# Patient Record
Sex: Female | Born: 1935 | Race: White | Hispanic: No | Marital: Married | Smoking: Never smoker
Health system: Southern US, Community
[De-identification: ages and names within clinical notes are randomized; demographics above are authoritative.]

## PROBLEM LIST (undated history)

## (undated) DIAGNOSIS — Z860100 Personal history of colon polyps, unspecified: Secondary | ICD-10-CM

## (undated) DIAGNOSIS — I2510 Atherosclerotic heart disease of native coronary artery without angina pectoris: Secondary | ICD-10-CM

## (undated) DIAGNOSIS — E119 Type 2 diabetes mellitus without complications: Secondary | ICD-10-CM

## (undated) DIAGNOSIS — K219 Gastro-esophageal reflux disease without esophagitis: Secondary | ICD-10-CM

## (undated) DIAGNOSIS — I10 Essential (primary) hypertension: Secondary | ICD-10-CM

## (undated) DIAGNOSIS — M069 Rheumatoid arthritis, unspecified: Secondary | ICD-10-CM

## (undated) DIAGNOSIS — K209 Esophagitis, unspecified without bleeding: Secondary | ICD-10-CM

## (undated) DIAGNOSIS — E785 Hyperlipidemia, unspecified: Secondary | ICD-10-CM

## (undated) DIAGNOSIS — K5790 Diverticulosis of intestine, part unspecified, without perforation or abscess without bleeding: Secondary | ICD-10-CM

## (undated) DIAGNOSIS — F419 Anxiety disorder, unspecified: Secondary | ICD-10-CM

## (undated) DIAGNOSIS — I779 Disorder of arteries and arterioles, unspecified: Secondary | ICD-10-CM

## (undated) DIAGNOSIS — Z9889 Other specified postprocedural states: Secondary | ICD-10-CM

## (undated) DIAGNOSIS — J45909 Unspecified asthma, uncomplicated: Secondary | ICD-10-CM

## (undated) DIAGNOSIS — Z9289 Personal history of other medical treatment: Secondary | ICD-10-CM

## (undated) DIAGNOSIS — M1990 Unspecified osteoarthritis, unspecified site: Secondary | ICD-10-CM

## (undated) DIAGNOSIS — I639 Cerebral infarction, unspecified: Secondary | ICD-10-CM

## (undated) DIAGNOSIS — R112 Nausea with vomiting, unspecified: Secondary | ICD-10-CM

## (undated) DIAGNOSIS — C73 Malignant neoplasm of thyroid gland: Secondary | ICD-10-CM

## (undated) DIAGNOSIS — I739 Peripheral vascular disease, unspecified: Secondary | ICD-10-CM

## (undated) DIAGNOSIS — E039 Hypothyroidism, unspecified: Secondary | ICD-10-CM

## (undated) HISTORY — DX: Personal history of colonic polyps: Z86.010

## (undated) HISTORY — PX: THYROIDECTOMY: SHX17

## (undated) HISTORY — PX: APPENDECTOMY: SHX54

## (undated) HISTORY — PX: BACK SURGERY: SHX140

## (undated) HISTORY — PX: LUMBAR LAMINECTOMY: SHX95

## (undated) HISTORY — DX: Hyperlipidemia, unspecified: E78.5

## (undated) HISTORY — PX: AUGMENTATION MAMMAPLASTY: SUR837

## (undated) HISTORY — DX: Esophagitis, unspecified without bleeding: K20.90

## (undated) HISTORY — PX: MENISCUS REPAIR: SHX5179

## (undated) HISTORY — PX: CATARACT EXTRACTION W/ INTRAOCULAR LENS  IMPLANT, BILATERAL: SHX1307

## (undated) HISTORY — DX: Unspecified asthma, uncomplicated: J45.909

## (undated) HISTORY — DX: Anxiety disorder, unspecified: F41.9

## (undated) HISTORY — DX: Type 2 diabetes mellitus without complications: E11.9

## (undated) HISTORY — DX: Essential (primary) hypertension: I10

## (undated) HISTORY — PX: ABDOMINAL HYSTERECTOMY: SHX81

## (undated) HISTORY — DX: Diverticulosis of intestine, part unspecified, without perforation or abscess without bleeding: K57.90

## (undated) HISTORY — PX: TUBAL LIGATION: SHX77

## (undated) HISTORY — PX: GANGLION CYST EXCISION: SHX1691

## (undated) HISTORY — PX: TONSILLECTOMY: SUR1361

## (undated) HISTORY — PX: CARPAL TUNNEL RELEASE: SHX101

## (undated) HISTORY — PX: CORONARY ANGIOPLASTY WITH STENT PLACEMENT: SHX49

## (undated) HISTORY — PX: ROTATOR CUFF REPAIR: SHX139

## (undated) HISTORY — DX: Unspecified osteoarthritis, unspecified site: M19.90

## (undated) HISTORY — PX: CHOLECYSTECTOMY: SHX55

## (undated) HISTORY — DX: Malignant neoplasm of thyroid gland: C73

## (undated) HISTORY — DX: Esophagitis, unspecified: K20.9

## (undated) HISTORY — DX: Personal history of colon polyps, unspecified: Z86.0100

## (undated) SURGERY — LEFT HEART CATH AND CORONARY ANGIOGRAPHY
Anesthesia: Moderate Sedation | Laterality: Bilateral

---

## 1997-07-02 ENCOUNTER — Ambulatory Visit: Admission: RE | Admit: 1997-07-02 | Discharge: 1997-07-02 | Payer: Self-pay | Admitting: Specialist

## 1998-07-25 ENCOUNTER — Ambulatory Visit (HOSPITAL_COMMUNITY): Admission: RE | Admit: 1998-07-25 | Discharge: 1998-07-25 | Payer: Self-pay | Admitting: Internal Medicine

## 1998-07-25 ENCOUNTER — Encounter: Payer: Self-pay | Admitting: Internal Medicine

## 1999-07-29 ENCOUNTER — Encounter: Payer: Self-pay | Admitting: Internal Medicine

## 1999-07-29 ENCOUNTER — Ambulatory Visit (HOSPITAL_COMMUNITY): Admission: RE | Admit: 1999-07-29 | Discharge: 1999-07-29 | Payer: Self-pay | Admitting: Internal Medicine

## 2000-08-20 ENCOUNTER — Ambulatory Visit (HOSPITAL_COMMUNITY): Admission: RE | Admit: 2000-08-20 | Discharge: 2000-08-20 | Payer: Self-pay | Admitting: Internal Medicine

## 2000-08-20 ENCOUNTER — Encounter: Payer: Self-pay | Admitting: Internal Medicine

## 2000-08-27 ENCOUNTER — Emergency Department (HOSPITAL_COMMUNITY): Admission: EM | Admit: 2000-08-27 | Discharge: 2000-08-27 | Payer: Self-pay | Admitting: *Deleted

## 2001-03-08 ENCOUNTER — Encounter: Payer: Self-pay | Admitting: Neurosurgery

## 2001-03-08 ENCOUNTER — Inpatient Hospital Stay (HOSPITAL_COMMUNITY): Admission: RE | Admit: 2001-03-08 | Discharge: 2001-03-12 | Payer: Self-pay | Admitting: Neurosurgery

## 2001-06-12 HISTORY — PX: BREAST IMPLANT REMOVAL: SHX5361

## 2002-05-10 ENCOUNTER — Encounter: Payer: Self-pay | Admitting: Neurosurgery

## 2002-05-10 ENCOUNTER — Ambulatory Visit (HOSPITAL_COMMUNITY): Admission: RE | Admit: 2002-05-10 | Discharge: 2002-05-10 | Payer: Self-pay | Admitting: Neurosurgery

## 2002-05-31 ENCOUNTER — Encounter: Payer: Self-pay | Admitting: Neurosurgery

## 2002-06-02 ENCOUNTER — Encounter: Payer: Self-pay | Admitting: Neurosurgery

## 2002-06-02 ENCOUNTER — Inpatient Hospital Stay (HOSPITAL_COMMUNITY): Admission: RE | Admit: 2002-06-02 | Discharge: 2002-06-06 | Payer: Self-pay | Admitting: Neurosurgery

## 2003-04-03 ENCOUNTER — Ambulatory Visit (HOSPITAL_COMMUNITY): Admission: RE | Admit: 2003-04-03 | Discharge: 2003-04-03 | Payer: Self-pay | Admitting: Neurosurgery

## 2003-11-23 ENCOUNTER — Ambulatory Visit: Payer: Self-pay

## 2004-03-06 ENCOUNTER — Ambulatory Visit: Payer: Self-pay | Admitting: General Practice

## 2004-04-18 ENCOUNTER — Ambulatory Visit (HOSPITAL_COMMUNITY): Admission: RE | Admit: 2004-04-18 | Discharge: 2004-04-18 | Payer: Self-pay | Admitting: Internal Medicine

## 2004-10-28 ENCOUNTER — Ambulatory Visit (HOSPITAL_COMMUNITY): Admission: RE | Admit: 2004-10-28 | Discharge: 2004-10-28 | Payer: Self-pay | Admitting: Neurosurgery

## 2005-02-04 ENCOUNTER — Inpatient Hospital Stay (HOSPITAL_COMMUNITY): Admission: RE | Admit: 2005-02-04 | Discharge: 2005-02-09 | Payer: Self-pay | Admitting: Neurosurgery

## 2005-03-02 ENCOUNTER — Ambulatory Visit: Payer: Self-pay | Admitting: General Practice

## 2005-04-16 ENCOUNTER — Ambulatory Visit: Payer: Self-pay | Admitting: General Practice

## 2005-04-17 ENCOUNTER — Emergency Department: Payer: Self-pay | Admitting: Emergency Medicine

## 2005-05-20 ENCOUNTER — Ambulatory Visit: Payer: Self-pay | Admitting: Vascular Surgery

## 2005-06-02 ENCOUNTER — Ambulatory Visit (HOSPITAL_COMMUNITY): Admission: RE | Admit: 2005-06-02 | Discharge: 2005-06-02 | Payer: Self-pay | Admitting: Internal Medicine

## 2005-09-25 ENCOUNTER — Other Ambulatory Visit: Payer: Self-pay

## 2005-09-25 ENCOUNTER — Emergency Department: Payer: Self-pay | Admitting: Emergency Medicine

## 2005-09-28 ENCOUNTER — Other Ambulatory Visit: Payer: Self-pay

## 2005-09-28 ENCOUNTER — Inpatient Hospital Stay: Payer: Self-pay | Admitting: Internal Medicine

## 2006-01-29 ENCOUNTER — Ambulatory Visit (HOSPITAL_COMMUNITY): Admission: RE | Admit: 2006-01-29 | Discharge: 2006-01-29 | Payer: Self-pay | Admitting: Neurosurgery

## 2006-03-21 ENCOUNTER — Emergency Department: Payer: Self-pay | Admitting: Emergency Medicine

## 2006-03-23 ENCOUNTER — Inpatient Hospital Stay (HOSPITAL_COMMUNITY): Admission: RE | Admit: 2006-03-23 | Discharge: 2006-03-26 | Payer: Self-pay | Admitting: Neurosurgery

## 2006-06-16 ENCOUNTER — Ambulatory Visit: Payer: Self-pay | Admitting: Internal Medicine

## 2006-06-18 ENCOUNTER — Ambulatory Visit: Payer: Self-pay | Admitting: Internal Medicine

## 2006-08-13 ENCOUNTER — Other Ambulatory Visit: Payer: Self-pay

## 2006-08-13 ENCOUNTER — Ambulatory Visit: Payer: Self-pay | Admitting: General Practice

## 2006-08-27 ENCOUNTER — Ambulatory Visit: Payer: Self-pay | Admitting: General Practice

## 2007-03-11 ENCOUNTER — Ambulatory Visit (HOSPITAL_COMMUNITY): Admission: RE | Admit: 2007-03-11 | Discharge: 2007-03-11 | Payer: Self-pay | Admitting: Internal Medicine

## 2007-03-22 ENCOUNTER — Encounter: Admission: RE | Admit: 2007-03-22 | Discharge: 2007-03-22 | Payer: Self-pay | Admitting: Internal Medicine

## 2007-04-07 ENCOUNTER — Ambulatory Visit: Payer: Self-pay | Admitting: Internal Medicine

## 2007-05-09 ENCOUNTER — Ambulatory Visit: Payer: Self-pay | Admitting: Surgery

## 2007-05-11 ENCOUNTER — Ambulatory Visit: Payer: Self-pay | Admitting: Unknown Physician Specialty

## 2007-05-25 ENCOUNTER — Encounter: Admission: RE | Admit: 2007-05-25 | Discharge: 2007-05-25 | Payer: Self-pay | Admitting: Neurosurgery

## 2007-06-01 ENCOUNTER — Ambulatory Visit: Payer: Self-pay | Admitting: Surgery

## 2007-06-08 ENCOUNTER — Ambulatory Visit: Payer: Self-pay | Admitting: Surgery

## 2007-08-23 ENCOUNTER — Inpatient Hospital Stay (HOSPITAL_COMMUNITY): Admission: RE | Admit: 2007-08-23 | Discharge: 2007-08-27 | Payer: Self-pay | Admitting: Neurosurgery

## 2008-04-06 ENCOUNTER — Ambulatory Visit: Payer: Self-pay | Admitting: Internal Medicine

## 2008-04-12 ENCOUNTER — Ambulatory Visit: Payer: Self-pay | Admitting: Internal Medicine

## 2008-04-26 ENCOUNTER — Ambulatory Visit: Payer: Self-pay | Admitting: Internal Medicine

## 2008-05-12 ENCOUNTER — Ambulatory Visit: Payer: Self-pay | Admitting: Internal Medicine

## 2008-06-12 ENCOUNTER — Ambulatory Visit: Payer: Self-pay | Admitting: Internal Medicine

## 2008-07-09 ENCOUNTER — Encounter: Admission: RE | Admit: 2008-07-09 | Discharge: 2008-07-09 | Payer: Self-pay | Admitting: Internal Medicine

## 2009-04-03 ENCOUNTER — Ambulatory Visit: Payer: Self-pay | Admitting: Unknown Physician Specialty

## 2009-04-11 ENCOUNTER — Ambulatory Visit: Payer: Self-pay | Admitting: Unknown Physician Specialty

## 2009-08-28 ENCOUNTER — Encounter: Admission: RE | Admit: 2009-08-28 | Discharge: 2009-08-28 | Payer: Self-pay | Admitting: Neurosurgery

## 2009-09-23 ENCOUNTER — Encounter: Admission: RE | Admit: 2009-09-23 | Discharge: 2009-09-23 | Payer: Self-pay | Admitting: Internal Medicine

## 2009-09-25 ENCOUNTER — Inpatient Hospital Stay (HOSPITAL_COMMUNITY): Admission: RE | Admit: 2009-09-25 | Discharge: 2009-09-29 | Payer: Self-pay | Admitting: Neurosurgery

## 2010-01-06 ENCOUNTER — Emergency Department: Payer: Self-pay | Admitting: Unknown Physician Specialty

## 2010-02-02 ENCOUNTER — Encounter: Payer: Self-pay | Admitting: Internal Medicine

## 2010-03-20 ENCOUNTER — Ambulatory Visit: Payer: Self-pay | Admitting: Ophthalmology

## 2010-03-20 DIAGNOSIS — I119 Hypertensive heart disease without heart failure: Secondary | ICD-10-CM

## 2010-03-27 LAB — URINALYSIS, ROUTINE W REFLEX MICROSCOPIC
Bilirubin Urine: NEGATIVE
Glucose, UA: NEGATIVE mg/dL
Hgb urine dipstick: NEGATIVE
Ketones, ur: NEGATIVE mg/dL
pH: 5.5 (ref 5.0–8.0)

## 2010-03-27 LAB — TYPE AND SCREEN

## 2010-03-27 LAB — COMPREHENSIVE METABOLIC PANEL
Albumin: 4.2 g/dL (ref 3.5–5.2)
BUN: 22 mg/dL (ref 6–23)
Calcium: 9.9 mg/dL (ref 8.4–10.5)
Chloride: 107 mEq/L (ref 96–112)
Creatinine, Ser: 0.92 mg/dL (ref 0.4–1.2)
Total Bilirubin: 0.3 mg/dL (ref 0.3–1.2)
Total Protein: 7.2 g/dL (ref 6.0–8.3)

## 2010-03-27 LAB — DIFFERENTIAL
Basophils Relative: 0 % (ref 0–1)
Eosinophils Absolute: 0.9 10*3/uL — ABNORMAL HIGH (ref 0.0–0.7)
Eosinophils Relative: 10 % — ABNORMAL HIGH (ref 0–5)
Monocytes Absolute: 0.5 10*3/uL (ref 0.1–1.0)
Monocytes Relative: 6 % (ref 3–12)

## 2010-03-27 LAB — CBC
HCT: 35.9 % — ABNORMAL LOW (ref 36.0–46.0)
Hemoglobin: 11.7 g/dL — ABNORMAL LOW (ref 12.0–15.0)
MCHC: 32.6 g/dL (ref 30.0–36.0)
MCV: 89.3 fL (ref 78.0–100.0)
Platelets: 230 10*3/uL (ref 150–400)

## 2010-03-27 LAB — PROTIME-INR: INR: 0.96 (ref 0.00–1.49)

## 2010-03-27 LAB — URINE MICROSCOPIC-ADD ON

## 2010-03-27 LAB — SURGICAL PCR SCREEN: Staphylococcus aureus: NEGATIVE

## 2010-04-01 ENCOUNTER — Ambulatory Visit: Payer: Self-pay | Admitting: Ophthalmology

## 2010-05-27 NOTE — H&P (Signed)
[name], [name]             ACCOUNT NO.:  [id number]   MEDICAL RECORD NO.:  [id number]          PATIENT TYPE:  INP   LOCATION:  3302                         FACILITY:  [facility]   PHYSICIAN:  Payton Doughty, M.D.      DATE OF BIRTH:  [birth year]   DATE OF ADMISSION:  08/23/2007  DATE OF DISCHARGE:                              HISTORY & PHYSICAL   ADMITTING DIAGNOSIS:  Loosening of screws at L2.   BODY OF TEXT:  This is a very nice now 77 year old right-handed white  lady who has been fused from L2 to S1, really did well and then took a  fall some months ago, has had increasing back pain, shows loosening of  screws at L2.  She is now admitted for revision of her fusion.   MEDICAL HISTORY:  Remarkable for hysterectomy 15 years ago and breast  implant 16 years ago.   MEDICATIONS:  She is on Toprol, Celebrex, Altace, Lipitor, calcium, and  aspirin.   ALLERGIES:  She is sensitive to CODEINE and allergic to SULFA.   SOCIAL HISTORY:  She does not smoke and does not drink, and is retired.   FAMILY HISTORY:  Parents are deceased, history is not given.   REVIEW OF SYSTEMS:  Remarkable for glasses, cataracts, hypertension,  hypercholesterolemia, leg weakness, back pain, leg pain, joint pain, and  arthritis.   PHYSICAL EXAMINATION:  HEENT:  Within normal limits.  NECK:  She has good range of motion of her neck.  CHEST:  Clear.  CARDIAC:  Regular rate and rhythm.  NEUROLOGICALLY:  She is awake, alert, and oriented.  Her cranial nerves  are intact.  Motor exam shows 5/5 strength throughout the upper and  lower extremities.  She has back pain when she walks.  Sensory exam is  intact.  Reflexes are 1 at the knees, absent at the ankles.  Toes are  downgoing bilaterally.   CT results have been reviewed above.   CLINICAL IMPRESSION:  Loosening of L2 screws.  The plan is for revision  of fusion with placement of new screws at L2.  The risks and benefits  have been discussed with her, and  she wished to proceed.           ______________________________  Payton Doughty, M.D.     MWR/MEDQ  D:  08/23/2007  T:  08/24/2007  Job:  [id number]

## 2010-05-27 NOTE — Op Note (Signed)
[name], [name]             ACCOUNT NO.:  [id number]   MEDICAL RECORD NO.:  [id number]          PATIENT TYPE:  INP   LOCATION:  3302                         FACILITY:  [facility]   PHYSICIAN:  Payton Doughty, M.D.      DATE OF BIRTH:  1933-02-27   DATE OF PROCEDURE:  08/23/2007  DATE OF DISCHARGE:                               OPERATIVE REPORT   PREOPERATIVE DIAGNOSIS:  Loosening of L2 screws.   POSTOPERATIVE DIAGNOSIS:  Loosening of L2 screws and minor instability  to L1-L2.   PROCEDURE:  1. Revision of L2-L5 fusion.  2. Replacement of L2 screws and in situ fusion at L1-L2.   ANESTHESIA:  General endotracheal.   PREPARATION:  Prepped and draped with alcohol wipe.   COMPLICATIONS:  None.   NURSE ASSISTANT:  Covington.   DOCTOR ASSISTANT:  Coletta Memos, M.D.   BODY OF TEXT:  A 77 year old lady with a fusion from L2-L5 after a fall  and has loosening of her screws at L2.  Taken to operating room,  smoothly anesthetized and intubated, placed prone on the operating  table.  Following shave, prep, and draped in the usual sterile fashion,  skin was infiltrated with 1% lidocaine in 1:400,000 epinephrine.  Skin  was incised.  The old skin incision was reopened, and the hardware  identified bilaterally.  It was obvious that there was disruption at the  posterior spinous ligament at L1-L2, which is the level above the  fusion.  A transverse process of L1 was also isolated.  The rod was  removed and the L2 screws were loose.  They were removed and replaced  with 7.25 40 screws which fit nice and tightly.  The rods were replaced.  We did not place pedicle screws at L1 because on a CT scan it looked  like the pedicles screws were too small.  So, did an in situ fusion from  the transverse process of L2 down to the fusion at L2-L3.  The  intertransverse fusion at L2-L3 and also laminar fusion posteriorly from  the residual lamina of L2 up to the lamina of L1.  This was done with  BMP on  Actifuse.  Successive layers of 0 Vicryl, 2-0 Vicryl, and 3-0  nylon were used to close.  Betadine, Telfa dressings were applied and  made occlusive with OpSite.  The patient returned to recovery room in  good condition.           ______________________________  Payton Doughty, M.D.     MWR/MEDQ  D:  08/23/2007  T:  08/24/2007  Job:  [id number]

## 2010-05-27 NOTE — Discharge Summary (Signed)
[name], [name]             ACCOUNT NO.:  [id number]   MEDICAL RECORD NO.:  [id number]          PATIENT TYPE:  INP   LOCATION:  3005                         FACILITY:  [facility]   PHYSICIAN:  Coletta Memos, M.D.     DATE OF BIRTH:  [birth year]   DATE OF ADMISSION:  08/23/2007  DATE OF DISCHARGE:  08/27/2007                               DISCHARGE SUMMARY   ADMITTING DIAGNOSIS:  Loose L2 pedicle [name].   DISCHARGE DIAGNOSES:  1. Loose L2 pedicle [name].  2. L1-L2 instability.   PROCEDURES:  1. Revision L2-L5 fusion.  2. Replacement L2 [name].  3. In situ fusion L1-L2.   DISCHARGE DESTINATION:  Home.   MEDICATIONS:  Percocet.   DISCHARGE STATUS:  Alive and well.   The patient is voiding.  Wound is clean and dry.  No signs of infection.  She is tolerating a regular diet.   [name] is a long-term patient of Dr. Temple Pacini and knows to call his  secretary for an appointment to remove her sutures.  She is doing well  otherwise.           ______________________________  Coletta Memos, M.D.     KC/MEDQ  D:  08/27/2007  T:  08/28/2007  Job:  [id number]

## 2010-05-30 ENCOUNTER — Ambulatory Visit: Payer: Self-pay | Admitting: Ophthalmology

## 2010-05-30 NOTE — Discharge Summary (Signed)
[name], [name]             ACCOUNT NO.:  [id number]   MEDICAL RECORD NO.:  [id number]          PATIENT TYPE:  INP   LOCATION:  3040                         FACILITY:  [facility]   PHYSICIAN:  Payton Doughty, M.D.      DATE OF BIRTH:  02/26/1933   DATE OF ADMISSION:  03/23/2006  DATE OF DISCHARGE:  03/26/2006                               DISCHARGE SUMMARY   ADMITTING DIAGNOSIS:  Spondylosis L2-3.   DISCHARGE DIAGNOSIS:  Spondylosis L2-3.   PROCEDURE:  L2-3 fusion connection with L3-4 and L4-5 fusion.   SURGEON:  Dr. Trey Sailors   COMPLICATIONS:  None.   DISCHARGE STATUS:  Alive and well.   This is a 77 year old lady whose history and physical is recounted in  the chart.  She has been fused at 3-4 and 4-5.  She has progressive  disease at 2-3 and is admitted now for fusion.   MEDICAL HISTORY:  Remarkable for hysterectomy.   ALLERGIES:  SULFA AND CODEINE.   GENERAL EXAM:  Intact.   NEUROLOGIC EXAM:  Lumbar spondylosis.   She was admitted after ascertaining normal laboratory values and  underwent fusions as noted above.  Postoperatively, she has done well.  Because of the need for Dilaudid PCA, she was kept in the ICU for two  nights.  The PCA was stopped and she is out on the floor.  She is able  to eat and void normally, she is walking well in physical therapy, her  incisions are dry and well healing and she is being discharged home in  the care of her family with Lorcet for pain and followup will be in the  [facility] office in about ten days for sutures.           ______________________________  Payton Doughty, M.D.     MWR/MEDQ  D:  03/26/2006  T:  03/27/2006  Job:  [id number]

## 2010-05-30 NOTE — Discharge Summary (Signed)
   [name], [name]                         ACCOUNT NO.:  [id number]   MEDICAL RECORD NO.:  [id number]                   PATIENT TYPE:  INP   LOCATION:  3037                                 FACILITY:  [facility]   PHYSICIAN:  Payton Doughty, M.D.                   DATE OF BIRTH:  06-20-1933   DATE OF ADMISSION:  06/02/2002  DATE OF DISCHARGE:  06/06/2002                                 DISCHARGE SUMMARY   ADMISSION DIAGNOSIS:  Spondylosis and stenosis, L3-4.   DISCHARGE DIAGNOSIS:  Spondylosis and stenosis, L3-4.   PROCEDURES:  1. L3-4 laminectomy and diskectomy.  2. Posterior lumbar body fusion with right-sided fusion cage.  3. Posterolateral arthrodesis.   SURGERY COMPLICATIONS:  None.   DISCHARGE STATUS:  Alive and well.   HISTORY:  This is a 77 year old right-handed white lady whose history and  physical is recounted on the chart.  She had had a 4-5 fusion done one year  ago and did well, has had progression of 3-4, and is admitted for a  decompression and fusion.   PAST MEDICAL HISTORY:  Remarkable for hypertension.   PAST SURGICAL HISTORY:  1. She has had a hysterectomy.  2. Breast implants.   ALLERGIES:  1. CODEINE.  2. SULFA.   PHYSICAL EXAMINATION:  GENERAL EXAM:  Intact.  NEUROLOGIC EXAM:  Intact with neurogenic claudication and L3  radiculopathies.   HOSPITAL COURSE:  She was admitted after ascertaining normal laboratory  values and underwent an L3-4 decompression and fusion with posterolateral  intertransverse bone.  There were no pedicle screws placed.  Postoperatively, she did pretty well.  PCA was stopped the second  postoperative day.  She had a bout of constipation, leading to some nausea  and vomiting; this resolved with a Dulcolax suppository.  Her strength is  full.  Her incision is dry and well healing.  She participated in physical therapy  without any difficulties, is well versed in activities of daily living.  She  is being discharged home with  afebrile, vital signs stable, and a dry, well-  healing incision.  Her followup will be in the [facility] office in about one week for sutures.                                               Payton Doughty, M.D.    MWR/MEDQ  D:  06/06/2002  T:  06/06/2002  Job:  [id number]

## 2010-05-30 NOTE — Op Note (Signed)
NAMEJULIEANNA, GERACI             ACCOUNT NO.:  [id number]   MEDICAL RECORD NO.:  [id number]          PATIENT TYPE:  INP   LOCATION:  3314                         FACILITY:  [facility]   PHYSICIAN:  Payton Doughty, M.D.      DATE OF BIRTH:  26-Jan-1933   DATE OF PROCEDURE:  03/23/2006  DATE OF DISCHARGE:                               OPERATIVE REPORT   PREOPERATIVE DIAGNOSIS:  Spondylosis and spinal stenosis with a  herniated disk at L2-3.   POSTOPERATIVE DIAGNOSIS:  Spondylosis and spinal stenosis with a  herniated disk at L2-3.   PROCEDURES:  L2-3 laminectomy, diskectomy, posterior lumbar interbody  fusion with morselized allograft, L2-L5 segmental pedicle screw  instrumentation and L2-3 posterolateral arthrodesis with BMP.   SURGEON:  Payton Doughty, M.D.   ANESTHESIA:  General endotracheal.  Prepped with Betadine and scrubbed  with alcohol wipe.   COMPLICATIONS:  None.   NURSE ASSISTANT:  Covington.   DOCTOR ASSISTANT:  Dr. Franky Macho   INDICATIONS FOR PROCEDURE:  A 77 year old lady with severe spondylosis  L2-3, that sits above a fusion that extends from L3-L5, taken to  operating room and smoothly anesthetized and intubated, placed prone on  the operating table.  Following shave, prepped and draped in the usual  sterile fashion.  Skin was infiltrated with 1% lidocaine with 1:400,000  epinephrine.  The skin was opened incised from mid L1 to mid L3 and the  lamina and transverse process of L2-L3 were exposed bilaterally in  subperiosteal plane as well as the first two screws in the old fusion.  The rod was cleaned off, however, the rods side-to-side connecting  device would not fit between the screws so the remaining rod was  exposed.  Following exposure laminectomy of the lamina, pars intra-  articularis, inferior facet of L2 and superior facet of L3 removed using  a high-speed drill.  The 2 root was decompressed as well as the thecal  sac at that level.  Diskectomy was carried  out.  Because of the anatomy  it was not feasible place interbody device so the thecal sac retracted  medially and the disk space itself was packed with morselized allograft.  Pedicle screws were then placed at L2, the rod for at L3, L4-L5 was  removed and longer rod, 100 mm, was placed from L5-L2 bilaterally. The  transverse processes were decorticated with a high-speed drill. Rod was  locked down with a locking caps. BMP was placed in the intertransverse  area between the L2-L3.  A final x-ray showed good placement of screws  and rod and good alignment.  Successive layers of O Vicryl, 2-0 Vicryl  and 3-0 nylon were used to close.  Betadine Telfa dressing was applied  and made occlusive with OpSite.  The patient returned to recovery room  in good condition.          ______________________________  Payton Doughty, M.D.    MWR/MEDQ  D:  03/23/2006  T:  03/25/2006  Job:  [id number]

## 2010-05-30 NOTE — H&P (Signed)
[place]  Patient:    [name], [name] Visit Number: [id number] MRN: [id number]          Service Type: SUR Location: 3000 3012 01 Attending Physician:  Emeterio Reeve Dictated by:   Payton Doughty, M.D. Admit Date:  03/08/2001                           History and Physical  ADMITTING DIAGNOSIS:  Lumbar spondylosis, L4-5.  SERVICE:  Neurosurgery.  HISTORY OF PRESENT ILLNESS:  This is a very nice 77 year old right-handed white lady who over the past couple of years has had increasing claudicatory complaints in her lower extremities.  Vascular studies have been normal.  She cannot walk without bending forward with her hands on her knees secretly for the heaviness of her legs and the full feeling of her feet.  This has been getting worse and she had an MRI that showed severe stenosis at 4-5 and she was referred to me.  MEDICAL HISTORY:  Remarkable for hysterectomy 15 years ago, breast implants 16 years ago.  MEDICATIONS: 1. Toprol-XL 50 mg a day. 2. Bextra 10 mg b.i.d. 3. Altace 2.5 mg a day. 4. Lipitor 10 mg a day. 5. Calcium D. 6. An aspirin a day.  ALLERGIES:   She is sensitive to CODEINE and allergic to SULFA.  SOCIAL HISTORY:  She does not smoke or drink.  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  FAMILY HISTORY:  Parents are deceased, histories are not given.  REVIEW OF SYSTEMS:  Remarkable for glasses, cataracts, hypertension, hypercholesterolemia, leg weakness, back pain, leg pain, joint pain and arthritis.  PHYSICAL EXAMINATION:  HEENT:  Within normal limits.  NECK:  She has good range of motion of her neck.  CHEST:  Clear.  CARDIAC:  Regular rate and rhythm without murmurs.  ABDOMEN:  Nontender with no hepatosplenomegaly.  EXTREMITIES:  Without clubbing or cyanosis.  GU:  Exam is deferred.  PERIPHERAL PULSES:  Good.  NEUROLOGIC:  She is awake, alert and oriented.  Her  cranial nerves are intact. Motor exam shows 5/5 strength throughout the upper and lower extremities. Sensory deficit as described in L5 and S1 distribution.  Reflexes are 2 at the knees, flicker at the ankles.  Straight leg raise is negative.  LABORATORY AND ACCESSORY DATA:  She comes with an MRI that shows severe stenosis based on a grade 1 slipped pseudo-disk and posterior element hypertrophy at L4-5, 5-1 and 3-4 are modestly affected but 4-5 is severely affected.  CLINICAL IMPRESSION:  Lumbar spondylosis and neurogenic claudication.  PLAN:  Plan is for a lumbar laminectomy, diskectomy and posterior lumbar interbody fusion with Ray threaded fusion cages and pedicle screw augmentation at L4-L5.  The risks and benefits of this approach have been discussed with her and she wishes to proceed. Dictated by:   Payton Doughty, M.D. Attending Physician:  Emeterio Reeve DD:  03/08/01 TD:  03/08/01 Job: [id number]

## 2010-05-30 NOTE — Op Note (Signed)
[name], [name]             ACCOUNT NO.:  [id number]   MEDICAL RECORD NO.:  [id number]          PATIENT TYPE:  INP   LOCATION:  3013                         FACILITY:  [facility]   PHYSICIAN:  Payton Doughty, M.D.      DATE OF BIRTH:  22-Mar-1933   DATE OF PROCEDURE:  02/04/2005  DATE OF DISCHARGE:                                 OPERATIVE REPORT   PREOPERATIVE DIAGNOSIS:  Spondylosis and possible nonunion at L3-4.   POSTOPERATIVE DIAGNOSIS:  Spondylosis and possible nonunion at L3-4.   OPERATION PERFORMED:  L3-4 exploration of the L3 root, L3-4 nonsegmental  pedicle screws and posterolateral arthrodesis.   SURGEON:  Payton Doughty, M.D.   ANESTHESIA:  General endotracheal.   PREP:  Sterile Betadine prep and scrub with alcohol wipe.   COMPLICATIONS:  None.   NURSE ASSISTANT:  Covington.   DOCTOR ASSISTANT:  Hilda Lias, M.D.   INDICATIONS FOR PROCEDURE:  This 77 year old lady with prior fusion at 3-4  now with pain in the left leg.   DESCRIPTION OF PROCEDURE:  The patient was taken to the operating room,  smoothly anesthetized, intubated, and placed prone on the operating table.  Following shave, prep and drape in the usual sterile fashion, the old skin  incision was reopened. The old hardware at L4-5 was identified.  The L3  transverse process was identified within its intended pedicle.  The left L3  nerve root was explored through the neural foramen and found to have  extensive scarring around it with some mass effect that was removed.  Pedicle screws were then placed bilaterally at L3.  The old rod removed at  L4-5 and a new rod placed extending from L3 to L5.  It was locked down with  locking screws.  Intraoperative x-ray showed good placement of pedicle  screws and rods.  The transverse processes were decorticated and Infuse on  the extender matrix was placed as intertransverse posterolateral  arthrodesis.  Successive layers of 0 Vicryl, 2-0 Vicryl and 3-0 nylon were  used to close the fascia and subcutaneous tissues and skin respectively  which was dressed with Betadine and Telfa, made occlusive with Op-Site.  The  patient then returned to recovery room in good condition.           ______________________________  Payton Doughty, M.D.    MWR/MEDQ  D:  02/04/2005  T:  02/04/2005  Job:  [id number]

## 2010-05-30 NOTE — H&P (Signed)
NAMELEXIE, [name]             ACCOUNT NO.:  [id number]   MEDICAL RECORD NO.:  [id number]          PATIENT TYPE:  INP   LOCATION:  2899                         FACILITY:  [facility]   PHYSICIAN:  Payton Doughty, M.D.      DATE OF BIRTH:  April 06, 1933   DATE OF ADMISSION:  02/04/2005  DATE OF DISCHARGE:                                HISTORY & PHYSICAL   ADMITTING DIAGNOSES:  Spondylosis at L3-4.   This is a very nice now 77 year old right-handed white lady.  She had a 4-5  fusion in 2002, 3-4 fusion in May of 2004 and did extremely well.  She  developed increased pain in her low back and pain in her right leg.  MR was  reviewed and it looks like she has got a synovial cyst at 3-4 over top of  the cage and she is now admitted for decompression and augmentation of her  fusion at 3-4.   PAST MEDICAL HISTORY:  1.  Hysterectomy 10 years ago.  2.  Breast implants 16 years ago.   MEDICATIONS:  Toprol, Altace, Lipitor, calcium, aspirin a day.   ALLERGIES:  CODEINE, SULFA.   SOCIAL HISTORY:  She does not smoke or drink.  Works at [employer].   FAMILY HISTORY:  Her parents are deceased.   SOCIAL HISTORY:  Not given.   REVIEW OF SYSTEMS:  Remarkable for glasses, cataracts, hypertension,  hypercholesterolemia, leg weakness, back pain, leg pain, joint pain,  arthritis.   PHYSICAL EXAMINATION:  HEENT:  Within normal limits.  NECK:  She has reasonable range of motion in her neck.  CHEST:  Clear.  CARDIAC:  Regular rate and rhythm.  ABDOMEN:  Nontender.  No hepatosplenomegaly.  EXTREMITIES:  Without clubbing or cyanosis.  GENITOURINARY:  Deferred.  PERIPHERAL PULSES:  Good.  NEUROLOGIC:  She is awake, alert, and oriented.  Cranial nerves are intact.  Motor examination shows 5/5 strength throughout the upper and lower  extremities.  She does describe a heaviness of the left leg.  Sensory  deficit is described in the left L3 distribution.  Deep tendon reflexes are  1 at the left knee, 2  at the right, 2 at the ankles bilaterally.  Straight  leg raise is positive.   MR demonstrates probable synovial cyst on the left side interfering with  left L3 root.   CLINICAL IMPRESSION:  Lumbar spondylosis with possible synovial cyst on the  left side.   PLAN:  Decompression left L3 root and augmentation and fusion with pedicle  screw at L3-4.  The risks and benefits of this approach have been discussed  with her and she wished to proceed.           ______________________________  Payton Doughty, M.D.     MWR/MEDQ  D:  02/04/2005  T:  02/04/2005  Job:  [id number]

## 2010-05-30 NOTE — H&P (Signed)
[name], [name]                         ACCOUNT NO.:  [id number]   MEDICAL RECORD NO.:  [id number]                   PATIENT TYPE:  INP   LOCATION:  3172                                 FACILITY:  [facility]   PHYSICIAN:  Payton Doughty, M.D.                   DATE OF BIRTH:  [birth year]   DATE OF ADMISSION:  06/02/2002  DATE OF DISCHARGE:                                HISTORY & PHYSICAL   ADMITTING DIAGNOSIS:  Spondylosis L3-4.   HISTORY OF PRESENT ILLNESS:  The patient is a 77 year old right-handed white  lady who underwent a fusion at 34/5 a year and a half ago.  Did extremely  well.  Has had gradual increase in back pain.  Studies demonstrated good  solid fusion at 4-5 with progression of disease at 3-4.  She has spinal  stenosis at L3-4 and is now admitted for decompression and fusion.   PAST MEDICAL HISTORY:  1. Hysterectomy 10 years ago.  2. Breast implants 16 years ago.   MEDICATIONS:  1. Toprol XL 50 mg daily.  2. Bextra 10 mg b.i.d.  3. Altace 2.5 mg daily.  4. Lipitor 10 mg daily.  5. Calcium.  6. Aspirin daily.   ALLERGIES:  Sensitive to CODEINE and allergic to SULFA.   SOCIAL HISTORY:  Does not smoke or drink.  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   FAMILY HISTORY:  Her parents are deceased.  History is not given.   REVIEW OF SYSTEMS:  Remarkable for glasses, cataracts, hypertension,  hypercholesterolemia, leg weakness, back pain, leg pain, joint pain, and  arthritis.   PHYSICAL EXAMINATION:  HEENT:  Within normal limits.  NECK:  She has reasonable range of motion of the neck.  CHEST:  Clear.  CARDIAC:  Regular rate and rhythm.  ABDOMEN:  Nontender.  No hepatosplenomegaly.  EXTREMITIES:  Without clubbing, cyanosis.  GENITOURINARY:  Deferred.  EXTREMITIES:  Peripheral pulses are good.  NEUROLOGIC:  She is alert and oriented.  Cranial nerves are intact.  Motor  examination demonstrates 5/5 strength  throughout the upper and lower  extremities.  Her complaint is that of heaviness and weakness of the legs  after she has been standing for about five minutes.  The numbness she  describes is in an L4 and L5 distribution with some L3 component.  Knee  jerks are absent.  Ankle jerks are flicker.  Straight leg raise is negative.  She has some back pain with bending.   LABORATORIES:  MRI demonstrates progression of spondylosis at 3-4.   CLINICAL IMPRESSION:  Neurogenic claudication secondary to lumbar  spondylosis.   PLAN:  Lumbar laminectomy, diskectomy, posterior lumbar interbody fusion  with posterior lateral bone grafting in her transverse processes, but no  pedicle screws so as to not predispose her to development of further  transition syndrome.  The risks  and benefits of this approach have been  discussed with her and she wished to proceed.                                               Payton Doughty, M.D.    MWR/MEDQ  D:  06/02/2002  T:  06/02/2002  Job:  [id number]

## 2010-05-30 NOTE — Op Note (Signed)
[place]. [facility]  Patient:    [name], [name] Visit Number: [id number] MRN: [id number]          Service Type: SUR Location: 3000 [id number] Attending Physician:  Emeterio Reeve Dictated by:   Payton Doughty, M.D. Proc. Date: 03/08/01 Admit Date:  03/08/2001 Discharge Date: 03/12/2001                             Operative Report  PREOPERATIVE DIAGNOSIS:  Spondylosis with grade I spondylolisthesis of L4 on L5.  POSTOPERATIVE DIAGNOSIS:  Spondylosis with grade I spondylolisthesis of L4 on L5.  OPERATIVE PROCEDURE:  L4-L5 laminectomy, diskectomy, posterior lumbar interbody fusion with threaded Ray fusion cages, and lateral arthrodesis with pedicle fixation with a _______.  SURGEON:  Payton Doughty, M.D.  ANESTHESIA:  General endotracheal.  PREPARATION:  Sterilely prepped with alcohol wipe.  COMPLICATIONS:  None.  ASSISTANT:  Stefani Dama, M.D.  INDICATIONS:  A 77 year old right-handed white lady with neurogenic claudication and severe spondylosis secondary to degenerative disk disease at L4-L5.  DESCRIPTION OF PROCEDURE:  She was taken to the operating room, smoothly anesthetized and intubated, placed prone on the operating table.  Following shave, prep, and drape in the usual sterile fashion, skin was infiltrated with 1% lidocaine with 1:400,000 epinephrine.  Skin incision was made from the top of L3 to the bottom of L5, and the laminae of L3, L4, and L5 were exposed bilaterally in the subperiosteal plane.  This was carried out over the transverse processes at L4 and L5.  Intraoperative x-ray confirmed correctness of level.  The pars interarticularis, lamina, and inferior facet of L4 and the superior facet of L5 were removed bilaterally.  The ligamentum flavum was removed and the 4 and 5 roots dissected free to the transversus area. There was abundant redundant ligamentum flavum with compression of the nerve roots that was removed without  difficulty.  Following complete dissection of the nerve roots and decompression, diskectomy was carried out.  Having completed dissection, Ray threaded fusion cages 14 x 21 mm were placed. Pedicle screws were then placed using the standard landmarks in L4 and L5, first on the right, then on the left.  Intraoperative x-ray showed good placement of Ray cages and pedicle screws.  The screws were connected via the rod.  The cages were filled with bone graft harvested from the facet joints and capped.  The wound was irrigated.  Hemostasis was assured.  X-ray of the final product showed the cages, pedicle screws, and rods to be in good position.  The wound was irrigated, and hemostasis assured.  The fascia was reapproximated with 0 Vicryl in interrupted fashion.  The subcutaneous tissue was reapproximated with 0 Vicryl in interrupted fashion.  The subcuticular tissues were  reapproximated with 3-0 Vicryl in interrupted fashion.  The skin was closed with 3-0 nylon in running locked fashion.  Betadine and Telfa dressing was applied and made occlusive with OpSite.  The patient was then returned to the recovery room in good condition. Dictated by:   Payton Doughty, M.D. Attending Physician:  Emeterio Reeve DD:  03/08/01 TD:  03/08/01 Job: [id number]

## 2010-05-30 NOTE — Discharge Summary (Signed)
[name], MILLS             ACCOUNT NO.:  [id number]   MEDICAL RECORD NO.:  [id number]          PATIENT TYPE:  INP   LOCATION:  3013                         FACILITY:  [facility]   PHYSICIAN:  Payton Doughty, M.D.      DATE OF BIRTH:  [birth year]   DATE OF ADMISSION:  02/04/2005  DATE OF DISCHARGE:  02/09/2005                                 DISCHARGE SUMMARY   ADMITTING DIAGNOSES:  Spondylosis L3-4.   DISCHARGE DIAGNOSES:  Spondylosis L3-4.   PROCEDURES:  L3-4 to S1 segmental fixation.   COMPLICATIONS:  None.   DISCHARGE STATUS:  Well.   This is a very nice 77 year old right-handed white female.  History and  physical is recounted on the chart.  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  She has a non union.  She is admitted for fusion. General health is intact.  Neurologically she is intact with back and leg pain.  She was admitted after  I obtained normal laboratory values and underwent augmentation of her fusion  at L3-4 in connection down to 4-5.   Postoperatively she has done well.  She had some pain in her back, but leg  pain was gone.  She had some nausea and vomiting with her medications  switching her PCA to Dilaudid, improved her nausea.  She is up now eating  and voiding normal.  On oral pain medications.  Her incision is dry and well  healing.  She remains afebrile.  She is being discharged home to the care of  her family.  Percocet for pain, and 5 days of Cipro.  Follow up will be in  the [facility] office in the week for sutures.           ______________________________  Payton Doughty, M.D.     MWR/MEDQ  D:  02/09/2005  T:  02/09/2005  Job:  [id number]

## 2010-05-30 NOTE — Op Note (Signed)
NAMEJASHIYA, BASSETT                         ACCOUNT NO.:  [id number]   MEDICAL RECORD NO.:  [id number]                   PATIENT TYPE:  INP   LOCATION:  3037                                 FACILITY:  [facility]   PHYSICIAN:  Payton Doughty, M.D.                   DATE OF BIRTH:  [birth year]   DATE OF PROCEDURE:  06/02/2002  DATE OF DISCHARGE:                                 OPERATIVE REPORT   PREOPERATIVE DIAGNOSIS:  Spondylosis, L3-4.   POSTOPERATIVE DIAGNOSIS:  Spondylosis, L3-4.   PROCEDURES:  1. L3-4 laminectomy and diskectomy.  2. Posterior lumbar interbody fusion with Ray Threaded Fusion Cages.  3. Intertransverse bone grafting.   SURGEON:  Payton Doughty, M.D.   ASSISTANTS:  Nurse assistant:  Basilia Jumbo.  Doctor assistant:  Hilda Lias, M.D.   ANESTHESIA:  General endotracheal.   PREPARATION:  Sterile Betadine prep and scrub with alcohol wipe.   COMPLICATIONS:  None.   DESCRIPTION OF PROCEDURE:  A 77 year old lady who had a prior fusion at 4-5  and has developed a transitional syndrome at 3-4.  She was taken to the  operating room and smoothly anesthetized and intubated, placed prone on the  operating table.  Following shave, prep, and drape in the usual sterile  fashion, the old incision was reopened and extended approximately 2 cm  cephalic.  The laminae and transverse processes of L3 and L4 were exposed in  a subperiosteal plane.  The pars interarticularis, lamina, and inferior  facet of L3 and the superior facet of L4 were removed using the high-speed  drill and the bone set aside for grafting.  Abundant redundant ligamentum  flavum was removed and the L3 and L4 nerve roots decompressed as well as the  whole of the thecal sac.  Having done this, the diskectomy was carried out  at 3-4.  There was significant spinal stenosis that was relieved by this  procedure.  Ray Threaded Fusion Cages 14 x 21 mm were then placed.  Intraoperative x-ray showed good placement of  cages.  They were packed with  bone graft harvested from the facet joints.  The transverse processes were  decorticated and Vitoss bone was laid across them to and covered with DBX  mixed with bone graft.  Once again the wound was irrigated and hemostasis  assured, the fascia reapproximated with 0 Vicryl in interrupted fashion, the  subcutaneous tissue was reapproximated with 0 Vicryl in interrupted fashion,  and the subcuticular tissue was reapproximated with 3-0 Vicryl in  interrupted fashion.  The skin was closed with 3-0 nylon in running locked  fashion.  A Betadine and Telfa dressing was applied and made occlusive with  OpSite.  The patient returned to the recovery room in good condition.  Payton Doughty, M.D.   MWR/MEDQ  D:  06/02/2002  T:  06/03/2002  Job:  [id number]

## 2010-05-30 NOTE — Discharge Summary (Signed)
[place]. [facility]  Patient:    [name], [name] Visit Number: [id number] MRN: [id number]          Service Type: SUR Location: 3000 3012 01 Attending Physician:  Emeterio Reeve Dictated by:   Stefani Dama, M.D. Admit Date:  03/08/2001 Discharge Date: 03/12/2001                             Discharge Summary  ADMITTING DIAGNOSIS:  Lumbar spondylosis at L4-L5.  DISCHARGE DIAGNOSIS:  Lumbar spondylosis at L4-L5.  PROCEDURE:  Lumbar laminectomy, L4-L5 diskectomy and pedicle screw fixation by Dr. Trey Sailors on March 08, 2001.  CONDITION ON DISCHARGE:  Improving.  HOSPITAL COURSE:  The patient is a 77 year old individual who has had significant back and bilateral leg pain having failed extensive efforts at conservative therapy and having persistent symptoms of neurogenic claudication.  She was advised regarding surgical decompression and fusion. This was performed on February 25.  Postoperatively, the patient has been able to ambulate all be it minimally.  Her incision is clean and dry.  Bowel and bladder control is intact.  DISCHARGE MEDICATION:  Prescription for Percocet without refills.  FOLLOWUP:  She will be seen in the office in approximately four weeks time for further followup. Dictated by:   Stefani Dama, M.D. Attending Physician:  Emeterio Reeve DD:  03/12/01 TD:  03/14/01 Job: [id number]

## 2010-05-30 NOTE — H&P (Signed)
NAMESIA, GABRIELSEN             ACCOUNT NO.:  [id number]   MEDICAL RECORD NO.:  [id number]          PATIENT TYPE:  INP   LOCATION:  2899                         FACILITY:  [facility]   PHYSICIAN:  Payton Doughty, M.D.      DATE OF BIRTH:  [birth year]   DATE OF ADMISSION:  03/23/2006  DATE OF DISCHARGE:                              HISTORY & PHYSICAL   ADMISSION DIAGNOSIS:  Spondylosis at L2-3.   HISTORY OF PRESENT ILLNESS:  This is a very nice now 77 year old right-  handed white lady who starting in 2003 had a fusion at L4-5 and L5-S1.  Several years later, she has had a fusion at 3-4.  She reported to my  office with increasing pain in her back and both lower extremities and  an MR that shows the herniated disc and severe spinal stenosis at L2-3  and she is now admitted for extension of her fusion up to L2-3.   PAST MEDICAL HISTORY:  Remarkable for hysterectomy 20 years ago, breast  implants 16 years ago.   MEDICATIONS:  She is on Toprol, Ibuprofen, Altace, Lipitor, Calcium and  aspirin daily.  She is sensitive to CODEINE.   ALLERGIES:  SULFA.   SOCIAL HISTORY:  She does not smoke and does not drink and she works at  [employer].   FAMILY HISTORY:  Both parents are deceased, history is not given.   REVIEW OF SYSTEMS:  Remarkable for glasses, cataracts, hypertension,  hypercholesterolemia, leg weakness, back pain, leg pain, joint pain or  arthritis.   PHYSICAL EXAMINATION:  HEENT:  Within normal limits.  She has reasonable  range of motion of the neck.  CHEST: Clear.  CARDIAC:  Regular rate and rhythm.  ABDOMEN:  Nontender, no hepatosplenomegaly.  EXTREMITIES:  Without clubbing or cyanosis.  Peripheral pulses are good.  GU:  Deferred.  NEURO:  She is awake, alert and oriented.  Cranial nerves are intact.  Motor exam shows 5/5 strength in the upper extremities.  She has the  right hip flexors, knee extensors, dorsi and plantar flexors are about 5-  /5.  She has a lot of pain  when she stands.  There is no knee jerk on  the left and flicker on the right.   LABORATORY DATA:  MR demonstrates as noted above a large disc at L2-3  and spinal stenosis.   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   PLAN:  The plan is for extension and fusion up to L2-3.  The risks and  benefits of this approach have been discussed with her and she wishes to  proceed.           ______________________________  Payton Doughty, M.D.     MWR/MEDQ  D:  03/23/2006  T:  03/23/2006  Job:  [id number]

## 2010-10-10 LAB — URINALYSIS, ROUTINE W REFLEX MICROSCOPIC
Ketones, ur: 15 — AB
Nitrite: NEGATIVE
pH: 5

## 2010-10-10 LAB — GLUCOSE, CAPILLARY
Glucose-Capillary: 112 — ABNORMAL HIGH
Glucose-Capillary: 119 — ABNORMAL HIGH
Glucose-Capillary: 129 — ABNORMAL HIGH
Glucose-Capillary: 134 — ABNORMAL HIGH
Glucose-Capillary: 152 — ABNORMAL HIGH
Glucose-Capillary: 156 — ABNORMAL HIGH
Glucose-Capillary: 162 — ABNORMAL HIGH

## 2010-10-10 LAB — DIFFERENTIAL
Basophils Absolute: 0
Basophils Relative: 0
Monocytes Absolute: 0.5
Neutro Abs: 5.6
Neutrophils Relative %: 74

## 2010-10-10 LAB — PROTIME-INR: INR: 0.9

## 2010-10-10 LAB — URINE MICROSCOPIC-ADD ON

## 2010-10-10 LAB — COMPREHENSIVE METABOLIC PANEL
Alkaline Phosphatase: 67
BUN: 18
CO2: 27
Chloride: 104
GFR calc non Af Amer: 53 — ABNORMAL LOW
Glucose, Bld: 113 — ABNORMAL HIGH
Potassium: 4.9
Total Bilirubin: 0.6

## 2010-10-10 LAB — CBC
HCT: 34.1 — ABNORMAL LOW
Hemoglobin: 11.4 — ABNORMAL LOW
WBC: 7.6

## 2010-10-10 LAB — APTT: aPTT: 30

## 2010-10-10 LAB — TYPE AND SCREEN
ABO/RH(D): A POS
Antibody Screen: NEGATIVE

## 2011-04-04 ENCOUNTER — Emergency Department: Payer: Self-pay | Admitting: Internal Medicine

## 2011-05-04 ENCOUNTER — Encounter: Payer: Self-pay | Admitting: Specialist

## 2011-05-13 ENCOUNTER — Encounter: Payer: Self-pay | Admitting: Specialist

## 2011-06-13 ENCOUNTER — Encounter: Payer: Self-pay | Admitting: Specialist

## 2011-08-10 ENCOUNTER — Other Ambulatory Visit: Payer: Self-pay | Admitting: Internal Medicine

## 2011-08-10 DIAGNOSIS — Z1231 Encounter for screening mammogram for malignant neoplasm of breast: Secondary | ICD-10-CM

## 2011-08-19 ENCOUNTER — Ambulatory Visit
Admission: RE | Admit: 2011-08-19 | Discharge: 2011-08-19 | Disposition: A | Discharge status: Home / Self Care | Payer: Medicare Other | Source: Ambulatory Visit | Attending: *Deleted | Admitting: *Deleted

## 2011-08-19 DIAGNOSIS — Z1231 Encounter for screening mammogram for malignant neoplasm of breast: Secondary | ICD-10-CM

## 2011-10-15 ENCOUNTER — Ambulatory Visit: Payer: Self-pay | Admitting: Family Medicine

## 2011-11-17 ENCOUNTER — Ambulatory Visit: Payer: Self-pay | Admitting: Unknown Physician Specialty

## 2012-07-26 ENCOUNTER — Other Ambulatory Visit: Payer: Self-pay

## 2012-07-26 DIAGNOSIS — Z1231 Encounter for screening mammogram for malignant neoplasm of breast: Secondary | ICD-10-CM

## 2012-08-29 ENCOUNTER — Ambulatory Visit
Admission: RE | Admit: 2012-08-29 | Discharge: 2012-08-29 | Disposition: A | Discharge status: Home / Self Care | Payer: Medicare Other | Source: Ambulatory Visit

## 2012-08-29 DIAGNOSIS — Z1231 Encounter for screening mammogram for malignant neoplasm of breast: Secondary | ICD-10-CM

## 2013-08-21 ENCOUNTER — Ambulatory Visit: Payer: Self-pay | Admitting: Specialist

## 2013-08-30 ENCOUNTER — Other Ambulatory Visit: Payer: Self-pay

## 2013-08-30 DIAGNOSIS — Z1231 Encounter for screening mammogram for malignant neoplasm of breast: Secondary | ICD-10-CM

## 2013-09-05 ENCOUNTER — Ambulatory Visit
Admission: RE | Admit: 2013-09-05 | Discharge: 2013-09-05 | Disposition: A | Discharge status: Home / Self Care | Payer: 59 | Source: Ambulatory Visit

## 2013-09-05 DIAGNOSIS — Z1231 Encounter for screening mammogram for malignant neoplasm of breast: Secondary | ICD-10-CM

## 2013-10-31 ENCOUNTER — Ambulatory Visit: Payer: Self-pay | Admitting: Orthopedic Surgery

## 2013-10-31 LAB — BASIC METABOLIC PANEL
Anion Gap: 7 (ref 7–16)
BUN: 24 mg/dL — AB (ref 7–18)
CALCIUM: 8.4 mg/dL — AB (ref 8.5–10.1)
CO2: 25 mmol/L (ref 21–32)
Chloride: 110 mmol/L — ABNORMAL HIGH (ref 98–107)
Creatinine: 0.8 mg/dL (ref 0.60–1.30)
EGFR (African American): >60
EGFR (Non-African Amer.): >60
Glucose: 155 mg/dL — ABNORMAL HIGH (ref 65–99)
Osmolality: 290 (ref 275–301)
Potassium: 4.5 mmol/L (ref 3.5–5.1)
SODIUM: 142 mmol/L (ref 136–145)

## 2013-10-31 LAB — URINALYSIS, COMPLETE
BILIRUBIN, UR: NEGATIVE
BLOOD: NEGATIVE
GLUCOSE, UR: NEGATIVE mg/dL (ref 0–75)
Ketone: NEGATIVE
NITRITE: NEGATIVE
PH: 5 (ref 4.5–8.0)
PROTEIN: NEGATIVE
RBC,UR: NONE SEEN /HPF (ref 0–5)
Specific Gravity: 1.011 (ref 1.003–1.030)
WBC UR: 6 /HPF (ref 0–5)

## 2013-10-31 LAB — SEDIMENTATION RATE: Erythrocyte Sed Rate: 21 mm/hr (ref 0–30)

## 2013-10-31 LAB — CBC WITH DIFFERENTIAL/PLATELET
BASOS ABS: 0.1 10*3/uL (ref 0.0–0.1)
Basophil %: 1 %
Eosinophil #: 0.2 10*3/uL (ref 0.0–0.7)
Eosinophil %: 2.2 %
HCT: 37.1 % (ref 35.0–47.0)
HGB: 11.6 g/dL — AB (ref 12.0–16.0)
LYMPHS ABS: 1.3 10*3/uL (ref 1.0–3.6)
LYMPHS PCT: 19.1 %
MCH: 27.9 pg (ref 26.0–34.0)
MCHC: 31.3 g/dL — AB (ref 32.0–36.0)
MCV: 89 fL (ref 80–100)
Monocyte #: 0.5 x10 3/mm (ref 0.2–0.9)
Monocyte %: 8 %
Neutrophil #: 4.7 10*3/uL (ref 1.4–6.5)
Neutrophil %: 69.7 %
Platelet: 184 10*3/uL (ref 150–440)
RBC: 4.17 10*6/uL (ref 3.80–5.20)
RDW: 13.6 % (ref 11.5–14.5)
WBC: 6.7 10*3/uL (ref 3.6–11.0)

## 2013-10-31 LAB — APTT: ACTIVATED PTT: 27.1 s (ref 23.6–35.9)

## 2013-10-31 LAB — PROTIME-INR
INR: 1
PROTHROMBIN TIME: 12.9 s (ref 11.5–14.7)

## 2013-11-16 ENCOUNTER — Ambulatory Visit: Payer: Self-pay | Admitting: Orthopedic Surgery

## 2013-12-22 ENCOUNTER — Ambulatory Visit: Payer: Self-pay | Admitting: Internal Medicine

## 2014-01-12 ENCOUNTER — Ambulatory Visit: Payer: Self-pay | Admitting: Internal Medicine

## 2014-02-12 ENCOUNTER — Ambulatory Visit: Payer: Self-pay | Admitting: Internal Medicine

## 2014-03-13 ENCOUNTER — Ambulatory Visit
Admit: 2014-03-13 | Disposition: A | Discharge status: Home / Self Care | Payer: Self-pay | Attending: Internal Medicine | Admitting: Internal Medicine

## 2014-03-26 ENCOUNTER — Ambulatory Visit: Payer: Self-pay | Admitting: Anesthesiology

## 2014-03-27 ENCOUNTER — Ambulatory Visit: Payer: Self-pay | Admitting: Unknown Physician Specialty

## 2014-04-04 ENCOUNTER — Ambulatory Visit
Admit: 2014-04-04 | Disposition: A | Discharge status: Home / Self Care | Payer: Self-pay | Attending: Oncology | Admitting: Oncology

## 2014-04-05 ENCOUNTER — Ambulatory Visit
Admit: 2014-04-05 | Disposition: A | Discharge status: Home / Self Care | Payer: Self-pay | Attending: Oncology | Admitting: Oncology

## 2014-04-11 ENCOUNTER — Encounter: Payer: Self-pay | Admitting: Cardiovascular Disease

## 2014-04-11 ENCOUNTER — Ambulatory Visit (INDEPENDENT_AMBULATORY_CARE_PROVIDER_SITE_OTHER): Payer: Medicare Other | Admitting: Cardiovascular Disease

## 2014-04-11 ENCOUNTER — Encounter (INDEPENDENT_AMBULATORY_CARE_PROVIDER_SITE_OTHER): Payer: Self-pay

## 2014-04-11 VITALS — BP 134/54 | HR 66 | Ht 63.0 in | Wt 169.2 lb

## 2014-04-11 DIAGNOSIS — R079 Chest pain, unspecified: Secondary | ICD-10-CM | POA: Diagnosis not present

## 2014-04-11 DIAGNOSIS — R0602 Shortness of breath: Secondary | ICD-10-CM | POA: Insufficient documentation

## 2014-04-11 DIAGNOSIS — C73 Malignant neoplasm of thyroid gland: Secondary | ICD-10-CM

## 2014-04-11 DIAGNOSIS — R5383 Other fatigue: Secondary | ICD-10-CM

## 2014-04-11 DIAGNOSIS — R002 Palpitations: Secondary | ICD-10-CM | POA: Diagnosis not present

## 2014-04-11 MED ORDER — NITROGLYCERIN 0.4 MG SL SUBL
0.4000 mg | SUBLINGUAL_TABLET | SUBLINGUAL | Status: DC | PRN
Start: 2014-04-11 — End: 2014-12-23

## 2014-04-11 NOTE — Assessment & Plan Note (Signed)
Typical as well as atypical features of chest pain the past several weeks to months. Etiology is not clear. Negative stress test in 2015. She is unable to treadmill secondary to leg weakness, shortness of breath. We have discussed the various treatment options with her. Catheterization was offered, calcium scoring also offered. She is leaning towards catheterization that would like to have her iodine treatment prior to any cardiac procedures.

## 2014-04-11 NOTE — Progress Notes (Signed)
Patient ID: [name], female    DOB: 01/14/1933, 81 y.o.   MRN: [id number]  HPI Comments: [name] is a 81 year old woman with a history of thyroid disease, moderate carotid disease noted 09/29/2005 at [facility], minimal bilateral plaque left greater than right on repeat ultrasound September 2015 leading to discovery of 1.2 cm complex nodule in the right lobe of the thyroid, status post resection who scheduled to have radioactive by tying treatment in the near future. She presents for evaluation of fatigue, chest heaviness.  She is concerned about her symptoms as she has a strong family history of coronary artery disease, sister has severe coronary artery disease. She's had no energy for several months now. Worse in the morning. She does not do any regular exercise. She also reports having pain in her arms, up 1 and down the other. Arms feel tired, weak Frequently has been waking up with heaviness in her chest, chest pain had 2 or 3 in the morning. This has happened 10-12 times. She  denies having any sleep apnea symptoms  She had echocardiogram done August 2015 which was essentially normal  Stress test done at that time also was essentially normal   EKG on today's visit shows normal sinus rhythm with no significant ST or T-wave changes   Allergies  Allergen Reactions  . Codeine Other (See Comments)    Other Reaction: GI Upset  . Petrolatum-Zinc Oxide Hives  . Celecoxib Other (See Comments)  . Ciprofloxacin Nausea Only and Nausea And Vomiting  . Metronidazole Nausea Only and Nausea And Vomiting  . Neomycin-Bacitracin Zn-Polymyx Other (See Comments)    Outpatient Encounter Prescriptions as of 04/11/2014  Medication Sig  . ALPRAZolam (XANAX) 0.25 MG tablet Take 0.25 mg by mouth at bedtime as needed.   Marland Kitchen aspirin EC 81 MG tablet Take 81 mg by mouth daily.   Marland Kitchen atorvastatin (LIPITOR) 20 MG tablet Take 20 mg by mouth daily at 6 PM.   . Cholecalciferol 1000 UNITS tablet Take 1,000  Units by mouth daily.   Marland Kitchen gabapentin (NEURONTIN) 100 MG capsule Take 100 mg by mouth 3 (three) times daily.   Marland Kitchen levothyroxine (SYNTHROID, LEVOTHROID) 50 MCG tablet Take 50 mcg by mouth daily before breakfast.   . metFORMIN (GLUCOPHAGE) 500 MG tablet Take 500 mg by mouth 2 (two) times daily with a meal.   . Multiple Vitamins-Minerals (PRESERVISION AREDS PO) Take by mouth 2 (two) times daily.  Marland Kitchen omeprazole (PRILOSEC) 20 MG capsule Take 20 mg by mouth daily.   . ramipril (ALTACE) 2.5 MG capsule Take 2.5 mg by mouth daily.   . TOPROL XL 50 MG 24 hr tablet Take 50 mg by mouth.   . Turmeric 500 MG CAPS Take 500 mg by mouth daily.  . nitroGLYCERIN (NITROSTAT) 0.4 MG SL tablet Place 1 tablet (0.4 mg total) under the tongue every 5 (five) minutes as needed for chest pain.  . [DISCONTINUED] meloxicam (MOBIC) 7.5 MG tablet   . [DISCONTINUED] oxyCODONE-acetaminophen (PERCOCET/ROXICET) 5-325 MG per tablet   . [DISCONTINUED] traMADol (ULTRAM) 50 MG tablet     Past Medical History  Diagnosis Date  . Hyperlipidemia   . Hypertension   . Osteoarthritis   . Mild reactive airways disease   . Chronic anxiety   . Esophagitis   . Diverticulosis   . History of colon polyps   . Type 2 diabetes mellitus   . Thyroid cancer     Past Surgical History  Procedure Laterality Date  .  Lumbar laminectomy    . Knee surgery      right knee  . Breast enhancement surgery    . Breast implant removal  06/2001  . Abdominal hysterectomy    . Rotator cuff repair      right  . Cholecystectomy    . Ganglion cyst excision      right AC joint  . Thyroidectomy      Social History  reports that she has never smoked. She does not have any smokeless tobacco history on file. She reports that she does not drink alcohol or use illicit drugs.  Family History family history includes Heart disease in her sister.   Review of Systems  Constitutional: Positive for fatigue.  HENT: Negative.   Eyes: Negative.   Respiratory:  Positive for chest tightness.   Cardiovascular: Positive for chest pain.  Gastrointestinal: Negative.   Endocrine: Negative.   Musculoskeletal: Negative.        Arm pain bilaterally  Skin: Negative.   Allergic/Immunologic: Negative.   Neurological: Positive for weakness.  Hematological: Negative.   Psychiatric/Behavioral: Negative.   All other systems reviewed and are negative.   BP 134/54 mmHg  Pulse 66  Ht 5\' 3"  (1.6 m)  Wt 169 lb 4 oz (76.771 kg)  BMI 29.99 kg/m2  Physical Exam  Constitutional: She is oriented to person, place, and time. She appears well-developed and well-nourished.  HENT:  Head: Normocephalic.  Nose: Nose normal.  Mouth/Throat: Oropharynx is clear and moist.  Eyes: Conjunctivae are normal. Pupils are equal, round, and reactive to light.  Neck: Normal range of motion. Neck supple. No JVD present.  Cardiovascular: Normal rate, regular rhythm, S1 normal, S2 normal, normal heart sounds and intact distal pulses.  Exam reveals no gallop and no friction rub.   No murmur heard. Pulmonary/Chest: Effort normal and breath sounds normal. No respiratory distress. She has no wheezes. She has no rales. She exhibits no tenderness.  Abdominal: Soft. Bowel sounds are normal. She exhibits no distension. There is no tenderness.  Musculoskeletal: Normal range of motion. She exhibits no edema or tenderness.  Lymphadenopathy:    She has no cervical adenopathy.  Neurological: She is alert and oriented to person, place, and time. Coordination normal.  Skin: Skin is warm and dry. No rash noted. No erythema.  Psychiatric: She has a normal mood and affect. Her behavior is normal. Judgment and thought content normal.    Assessment and Plan  Nursing note and vitals reviewed.

## 2014-04-11 NOTE — Assessment & Plan Note (Signed)
Chronic fatigue over the past several months. Probably should entertain depression as a cause of her symptoms. Recommended a regular walking program

## 2014-04-11 NOTE — Assessment & Plan Note (Signed)
Mild chronic shortness of breath. Suspect secondary to deconditioning though unable to exclude ischemia. Prior echocardiogram showed normal LV function No signs of heart failure. Blood pressure well controlled

## 2014-04-11 NOTE — Assessment & Plan Note (Signed)
She is scheduled to receive radioactive iodine treatment in the near future, she reports a will be in 2-3 weeks

## 2014-04-11 NOTE — Patient Instructions (Addendum)
You are doing well.  Please call the office if you have worsening shortness of breath  Take nitro under the tongue as needed for significant shortness of breath, chest tightness  Please call us if you have new issues that need to be addressed before your next appt.  Your physician wants you to follow-up in: 6 months.  You will receive a reminder letter in the mail two months in advance. If you don't receive a letter, please call our office to schedule the follow-up appointment.

## 2014-04-13 ENCOUNTER — Ambulatory Visit
Admit: 2014-04-13 | Disposition: A | Discharge status: Home / Self Care | Payer: Self-pay | Attending: Oncology | Admitting: Oncology

## 2014-04-27 ENCOUNTER — Other Ambulatory Visit: Payer: Self-pay | Admitting: Oncology

## 2014-04-27 DIAGNOSIS — C801 Malignant (primary) neoplasm, unspecified: Secondary | ICD-10-CM

## 2014-05-04 LAB — COMPREHENSIVE METABOLIC PANEL
Albumin: 4.1 g/dL
Alkaline Phosphatase: 52 U/L
Anion Gap: 8 (ref 7–16)
BUN: 20 mg/dL
Bilirubin,Total: 0.6 mg/dL
CALCIUM: 9.5 mg/dL
Chloride: 105 mmol/L
Co2: 26 mmol/L
Creatinine: 1.1 mg/dL — ABNORMAL HIGH
GFR CALC AF AMER: 56 — AB
GFR CALC NON AF AMER: 48 — AB
Glucose: 161 mg/dL — ABNORMAL HIGH
POTASSIUM: 4.5 mmol/L
SGOT(AST): 26 U/L
SGPT (ALT): 17 U/L
Sodium: 139 mmol/L
TOTAL PROTEIN: 6.9 g/dL

## 2014-05-04 LAB — TSH: Thyroid Stimulating Horm: >100 u[IU]/mL

## 2014-05-04 LAB — CBC CANCER CENTER
Basophil #: 0 x10 3/mm (ref 0.0–0.1)
Basophil %: 0.9 %
EOS PCT: 1.6 %
Eosinophil #: 0.1 x10 3/mm (ref 0.0–0.7)
HCT: 38.1 % (ref 35.0–47.0)
HGB: 12.6 g/dL (ref 12.0–16.0)
Lymphocyte #: 1.1 x10 3/mm (ref 1.0–3.6)
Lymphocyte %: 25.8 %
MCH: 29.2 pg (ref 26.0–34.0)
MCHC: 33.2 g/dL (ref 32.0–36.0)
MCV: 88 fL (ref 80–100)
Monocyte #: 0.2 x10 3/mm (ref 0.2–0.9)
Monocyte %: 5.4 %
NEUTROS PCT: 66.3 %
Neutrophil #: 2.9 x10 3/mm (ref 1.4–6.5)
PLATELETS: 180 x10 3/mm (ref 150–440)
RBC: 4.33 10*6/uL (ref 3.80–5.20)
RDW: 15.2 % — ABNORMAL HIGH (ref 11.5–14.5)
WBC: 4.4 x10 3/mm (ref 3.6–11.0)

## 2014-05-05 NOTE — Op Note (Signed)
PATIENT NAME:  [name], [name] MR#:  [id number] DATE OF BIRTH:  07-30-1933  DATE OF PROCEDURE:  11/20/2013  PREOPERATIVE DIAGNOSES: Left shoulder full-thickness subscapularis tear, biceps tendinosis with possible tear, supraspinatus tendinosis, acromioclavicular joint arthrosis, left shoulder impingement and glenohumeral arthrosis.   POSTOPERATIVE DIAGNOSES:  Left shoulder full thickness subscapularis tear, partial supraspinatus tear within the tendon, partial biceps tendon tear with tendinosis, severe glenohumeral degenerative joint disease with acromioclavicular degenerative joint disease, extensive degenerative tear of the glenoid labrum, subacromial impingement and subacromial bursitis.   PROCEDURE: Left shoulder arthroscopic subscapularis repair, labral debridement, partial rotator cuff debridement; glenohumeral debridement, subacromial decompression, distal clavicle excision, and biceps tenotomy.   SURGEON:  Timoteo Gaul, MD   ANESTHESIA: General without interscalene block, 1% lidocaine plain was used for preinjection of the arthroscopy portals, and 0.5% Marcaine plain was then injected into the glenohumeral joint and subacromial space at the conclusion of the case.   ESTIMATED BLOOD LOSS: Minimal.   COMPLICATIONS: None.   IMPLANTS: ArthroCare Magnum 2 anchors x 2.   INDICATION FOR THE PROCEDURE: [name] is a 81 year old female who has had persistent left shoulder pain particularly with external rotation and abduction. The MRI had revealed extensive tendinosis of the supra and infraspinatus. She was found to have glenohumeral degenerative changes and a full-thickness tear with retraction of the subscapularis. There was suggestion of biceps tendinosis versus partial tear as well. Given that the patient did not respond to nonoperative management including corticosteroid injections, NSAIDs and strengthening exercises, the patient decided to proceed with surgery. I reviewed the  risks and benefits of this surgery with her including infection, bleeding, nerve or blood vessel injury, persistent left shoulder pain, re-tear of the tendon, or hardware failure, and persistent left shoulder pain and the need for further surgery including shoulder arthroplasty. Medical risks include, but are not limited to DVT and pulmonary embolism, myocardial infarction, stroke, pneumonia, respiratory failure and death. The patient understood these risks and wished to proceed.   The patient was met in the preoperative area. I marked the left shoulder with the word "yes" and my initials according to the hospital's right site protocol. She decided against an interscalene block. She was brought to the operating room where she underwent general endotracheal intubation. She was then positioned in a beach chair position. A spider arm positioner was used for this case. She was prepped and draped in a sterile fashion. A timeout was performed to verify the patient's name, date of birth, medical record number, correct site of surgery and correct procedure to be performed. It was also used to verify the patient had received antibiotics and that all appropriate instruments, implants, and radiographic studies were available in the room. Once all in attendance, were in agreement, the case began.   The patient's bony landmarks were drawn out with a surgical marker. The proposed arthroscopy incisions were also drawn out and pre-injected with 1% lidocaine plain. An 11 blade was used to establish a posterior portal. Through this, the arthroscope was placed into the glenohumeral joint. Arthroscopic images of the diagnostic portion of the arthroscopy were taken. Findings on arthroscopy included extensive glenohumeral joint arthritis with large areas of full-thickness chondral loss. The patient had loose chondral flaps as well which were debrided with a 4-0 resector shaver blade through the anterior portal. The anterior portal  had been created using an 18-gauge spinal needle for localization. A 5.75 mm arthroscopic cannula was placed through the anterior portal. There were large extensive tears of  the labrum including the anterior/superior and posterior labrums. There was extensive calcification of the labrum anteriorly. This was debrided also using a 4-0 resector shaver blade.   The biceps tendon was partially torn and had extensive tendinosis. A biceps tenotomy was performed using an arthroscopic scissor, and the biceps stump was debrided using a 4-0 resector shaver blade. This allowed for then visualization of the subscapularis tear. The subscapularis then had 2 ArthroCare Smart stitches placed in the superior two thirds where the tear was. The patient had remaining fibers inferiorly still connected to the lesser tuberosity. Two ArthroCare Magnum 2 anchors were then placed in the lesser tuberosity. The Smart stitches had been loaded through these anchors and the subscapularis was brought in excellent approximation to the greater tuberosity. The greater tuberosity had been prepared using a 4-0 resector shaver blade to remove all remaining torn fibers of the subscapularis prior to placement of the anchors.   The patient after debridement of the glenohumeral joint was found to have a transverse tear cutting into the tendinous portion of the subscapularis approximately 1.5 to 2 cm from its attachment to the greater tuberosity. The arthroscope was then placed the subacromial space. There was significant bursitis which was debrided using a 4-0 resector shaver blade through a lateral portal which was also created using an 18-gauge spinal needle for localization. There was mild fraying of the supraspinatus at the greater tuberosity. Again, the transverse tear into the anterior portion of the supraspinatus was again visualized. Through the anterior portal a side-to-side suture was placed using ArthroCare Smart stitch. This allowed for  approximation of a portion of the tendon but further side-to-side sutures could not be placed due to the lack of elasticity of the tear at this level. A subacromial decompression was performed using a 5.5 mm resector shaver blade through the lateral portal. The 5.5 mm resector shaver blade was then placed through the anterior portal and a distal clavicle excision was performed; final images of the subacromial space were then taken.   The subacromial space was copiously irrigated. All arthroscopic instruments were removed. The 3 portal incisions were closed with 4-0 nylon. A dry sterile dressing was applied along with a Polar Care sleeve, TENS unit pads, and an abduction sling. The patient was then awoken and brought to the PACU in stable condition.   I was scrubbed and present for the entire case, and all sharp and instrument counts were correct at the conclusion of the case.   I met with the patient's husband in the postoperative consultation room to let him know the case had gone without complication. The patient was stable in the recovery room. I did explain to him however that the patient had extensive glenohumeral joint arthritis and a transverse tear within the substance of the supraspinatus tendon which could not be fully repaired. He understood these findings. The patient will be discharged home and follow up with me in approximately 1 week  for follow-up.    ____________________________ Timoteo Gaul, MD klk:[id number] D: 11/20/2013 21:33:08 ET T: 11/20/2013 22:02:38 ET JOB#: [id number]  cc: Timoteo Gaul, MD, <Dictator> Timoteo Gaul MD ELECTRONICALLY SIGNED 11/29/2013 8:07

## 2014-05-07 LAB — SURGICAL PATHOLOGY

## 2014-05-08 ENCOUNTER — Ambulatory Visit
Admit: 2014-05-08 | Disposition: A | Discharge status: Home / Self Care | Payer: Self-pay | Attending: Oncology | Admitting: Oncology

## 2014-05-10 ENCOUNTER — Telehealth: Payer: Self-pay | Admitting: *Deleted

## 2014-05-10 NOTE — Telephone Encounter (Signed)
Spoke w/ pt.  She reports that she saw Dr. Rockey Situ on 04/11/14 and cath was discussed, as pt has recently had several family members develop heart issues, sister recently had stents placed. She would like to go ahead and set up a cath to be sure that she does not have any blockages.  She is s/p thyroidectomy and last tx w/ Dr. Oliva Bustard is 05/22/14. She would like to see about setting cath up for anytime after that.  Advised her that I will make Dr. Rockey Situ aware and call her back w/ date and time for cath & pre-procedure labs.

## 2014-05-10 NOTE — Telephone Encounter (Signed)
Pt is asking if we can help her schedule  a heart cath.  Please advise.

## 2014-05-10 NOTE — Telephone Encounter (Signed)
Ok to schedule cardiac cath for rounds day

## 2014-05-13 NOTE — Op Note (Signed)
PATIENT NAME:  [name], [name] MR#:  [id number] DATE OF BIRTH:  November 14, 1933  DATE OF PROCEDURE:  03/27/2014  PREOPERATIVE DIAGNOSIS: Multinodular goiter.   POSTOPERATIVE DIAGNOSIS: Multinodular goiter.   ATTENDING SURGEON:  Beverly Gust, M.D.   ASSISTANT SURGEON:juengel   OPERATION PERFORMED: Total thyroidectomy, laryngeal nerve monitoring for 1 hour and 30 minutes.  OPERATIVE FINDINGS: A firmly enlarged gland.  It had multiple nodules on the right-hand side, one measuring approximately 2 x 2 cm, the other cystic area approximately 2.5 cm.   DESCRIPTION OF PROCEDURE:  [name] was identified in the holding area and taken to the operating room and placed in supine position.  After general endotracheal anesthesia with a laryngeal monitoring endotracheal tube which was visualized, the patient's neck was gently extended. An incision line was marked through a natural skin crease. A local anesthetic of 1% lidocaine 1:100,000 epinephrine was used to inject along the skin crease. A total of 4 mL was used. The neck was then prepped and draped sterilely.  A 15 blade was used to incise down to and through the platysma muscle. Hemostasis was achieved using the Bovie cautery. The strap muscles were divided in the midline. There were multiple vessels there which were divided using the Harmonic scalpel. Strap muscles were retracted beginning on the right-hand side over the edge of the thyroid gland. The superior pole was identified.  The vessels were isolated and divided using the Harmonic scalpel. There were several vessels which fed into the gland itself which were divided using the Harmonic scalpel. The gland was then medialized, superior and inferior parathyroid glands were identified, and left on the vascular pedicles. The recurrent laryngeal nerve was identified in the tracheoesophageal groove. This was stimulated and remained intact throughout the case. The gland was then peeled over the anterior wall of the  trachea dividing any vessels and fibrous tissue using the Harmonic scalpel. With the left lobe dissected, the attention was then turned to the right lobe. Again, the superior pole vessels were identified and divided using the Harmonic scalpel. There were 2, 1 large nodule approximately 2 cm and a smaller cystic nodule area approximately 2.5 cm.  These were adjacent to the midportion of the gland on the right. The gland was medialized, superior and inferior parathyroid glands were identified, and remained intact. The recurrent laryngeal nerve again was identified and the tracheoesophageal groove was stimulated and remained intact throughout the case. The gland was then medialized.  Berry's ligament was released has it had been on the opposite side. This gave excellent release of the gland from the anterior tracheal wall.  Using the microbipolar, the final fragments of the fibrous tissue were divided removing the gland in its entirety. A stitch was placed in the left upper lobe for identification.  The wound was then copiously irrigated with saline. Parathyroid glands appeared to be healthy and intact. Both recurrent laryngeal nerves were stimulated and remained intact after the case.  Surgicel was then placed in the neurovascular beds. Two #10 TLS drains were brought out of the wound inferiorly.  The patient was then returned to anesthesia where she was awakened in the operating room and taken to the recovery room in stable condition.   CULTURES: None.   SPECIMENS: Total thyroid.   ESTIMATED BLOOD LOSS: Less than 20 mL.   ____________________________ Roena Malady, MD ctm:sp D: 03/27/2014 09:02:17 ET T: 03/27/2014 09:21:23 ET JOB#: [id number]  cc: Roena Malady, MD, <Dictator> Roena Malady MD ELECTRONICALLY SIGNED 04/06/2014 8:09

## 2014-05-14 ENCOUNTER — Telehealth: Payer: Self-pay | Admitting: *Deleted

## 2014-05-14 NOTE — Telephone Encounter (Signed)
Spoke w/ pt.  Advised her that I am tentatively scheduling her for cath on 05/31/14 @ 10:30. I will call her tomorrow when this date and time are confirmed and we can schedule her for preprocedure labs.

## 2014-05-14 NOTE — Telephone Encounter (Signed)
Patient came by clinic to say that she is ready to schedule her heart cath. Please call patient.

## 2014-05-15 NOTE — Telephone Encounter (Signed)
Spoke w/ pt.  She is sched for pre-cath labs on 05/23/14.  Will send her for chest-ray and pre-registration at that time after reviewing instructions.

## 2014-05-15 NOTE — Telephone Encounter (Signed)
Left message for pt that I have confirmed 05/31/14 @ 10:30 w/ scheduling, for her to arrive at the [facility] at 9:30. Asked her to call back so that we can set up preprocedure labs, chest x-ray and go over instructions.

## 2014-05-17 ENCOUNTER — Encounter
Admission: RE | Admit: 2014-05-17 | Discharge: 2014-05-17 | Disposition: A | Discharge status: Home / Self Care | Payer: Medicare Other | Source: Ambulatory Visit | Attending: Oncology | Admitting: Oncology

## 2014-05-17 DIAGNOSIS — C801 Malignant (primary) neoplasm, unspecified: Secondary | ICD-10-CM | POA: Diagnosis present

## 2014-05-17 MED ORDER — SODIUM IODIDE I 131 CAPSULE: 106.3000 | Freq: Once | INTRAVENOUS | Status: AC | PRN

## 2014-05-18 ENCOUNTER — Other Ambulatory Visit: Payer: Self-pay | Admitting: *Deleted

## 2014-05-18 DIAGNOSIS — C73 Malignant neoplasm of thyroid gland: Secondary | ICD-10-CM

## 2014-05-22 ENCOUNTER — Inpatient Hospital Stay: Payer: Medicare Other | Attending: Oncology

## 2014-05-22 ENCOUNTER — Inpatient Hospital Stay (HOSPITAL_BASED_OUTPATIENT_CLINIC_OR_DEPARTMENT_OTHER): Payer: Medicare Other | Admitting: Oncology

## 2014-05-22 VITALS — BP 146/70 | HR 64 | Temp 96.2°F | Wt 166.4 lb

## 2014-05-22 DIAGNOSIS — M069 Rheumatoid arthritis, unspecified: Secondary | ICD-10-CM | POA: Insufficient documentation

## 2014-05-22 DIAGNOSIS — Z8673 Personal history of transient ischemic attack (TIA), and cerebral infarction without residual deficits: Secondary | ICD-10-CM | POA: Insufficient documentation

## 2014-05-22 DIAGNOSIS — E119 Type 2 diabetes mellitus without complications: Secondary | ICD-10-CM | POA: Insufficient documentation

## 2014-05-22 DIAGNOSIS — Z79899 Other long term (current) drug therapy: Secondary | ICD-10-CM | POA: Diagnosis not present

## 2014-05-22 DIAGNOSIS — I10 Essential (primary) hypertension: Secondary | ICD-10-CM

## 2014-05-22 DIAGNOSIS — C73 Malignant neoplasm of thyroid gland: Secondary | ICD-10-CM

## 2014-05-22 LAB — COMPREHENSIVE METABOLIC PANEL
ALK PHOS: 55 U/L (ref 38–126)
ALT: 16 U/L (ref 14–54)
AST: 26 U/L (ref 15–41)
Albumin: 4.3 g/dL (ref 3.5–5.0)
Anion gap: 6 (ref 5–15)
BUN: 21 mg/dL — AB (ref 6–20)
CALCIUM: 9.2 mg/dL (ref 8.9–10.3)
CHLORIDE: 106 mmol/L (ref 101–111)
CO2: 25 mmol/L (ref 22–32)
Creatinine, Ser: 0.95 mg/dL (ref 0.44–1.00)
GFR, EST NON AFRICAN AMERICAN: 56 mL/min — AB (ref >60)
Glucose, Bld: 193 mg/dL — ABNORMAL HIGH (ref 65–99)
POTASSIUM: 3.6 mmol/L (ref 3.5–5.1)
Sodium: 137 mmol/L (ref 135–145)
Total Bilirubin: 0.5 mg/dL (ref 0.3–1.2)
Total Protein: 7.3 g/dL (ref 6.5–8.1)

## 2014-05-22 LAB — CBC WITH DIFFERENTIAL/PLATELET
BASOS ABS: 0 10*3/uL (ref 0–0.1)
BASOS PCT: 1 %
Eosinophils Absolute: 0.1 10*3/uL (ref 0–0.7)
Eosinophils Relative: 1 %
HCT: 37.7 % (ref 35.0–47.0)
Hemoglobin: 12.3 g/dL (ref 12.0–16.0)
LYMPHS PCT: 12 %
Lymphs Abs: 0.6 10*3/uL — ABNORMAL LOW (ref 1.0–3.6)
MCH: 29.3 pg (ref 26.0–34.0)
MCHC: 32.6 g/dL (ref 32.0–36.0)
MCV: 90 fL (ref 80.0–100.0)
MONO ABS: 0.3 10*3/uL (ref 0.2–0.9)
Monocytes Relative: 7 %
Neutro Abs: 4.1 10*3/uL (ref 1.4–6.5)
Neutrophils Relative %: 79 %
PLATELETS: 174 10*3/uL (ref 150–440)
RBC: 4.18 MIL/uL (ref 3.80–5.20)
RDW: 15.8 % — AB (ref 11.5–14.5)
WBC: 5.1 10*3/uL (ref 3.6–11.0)

## 2014-05-22 LAB — TSH: TSH: >90 u[IU]/mL — ABNORMAL HIGH (ref 0.350–4.500)

## 2014-05-23 ENCOUNTER — Other Ambulatory Visit
Admission: RE | Admit: 2014-05-23 | Discharge: 2014-05-23 | Disposition: A | Discharge status: Home / Self Care | Payer: Medicare Other | Source: Ambulatory Visit | Attending: Cardiovascular Disease | Admitting: Cardiovascular Disease

## 2014-05-23 ENCOUNTER — Other Ambulatory Visit: Payer: Medicare Other

## 2014-05-23 ENCOUNTER — Other Ambulatory Visit: Payer: Self-pay

## 2014-05-23 ENCOUNTER — Ambulatory Visit
Admission: RE | Admit: 2014-05-23 | Discharge: 2014-05-23 | Disposition: A | Discharge status: Home / Self Care | Payer: Medicare Other | Source: Ambulatory Visit | Attending: Cardiovascular Disease | Admitting: Cardiovascular Disease

## 2014-05-23 DIAGNOSIS — R079 Chest pain, unspecified: Secondary | ICD-10-CM

## 2014-05-23 DIAGNOSIS — Z01818 Encounter for other preprocedural examination: Secondary | ICD-10-CM

## 2014-05-23 DIAGNOSIS — Z01812 Encounter for preprocedural laboratory examination: Secondary | ICD-10-CM | POA: Diagnosis present

## 2014-05-23 DIAGNOSIS — Z95811 Presence of heart assist device: Secondary | ICD-10-CM | POA: Diagnosis present

## 2014-05-23 LAB — PROTIME-INR
INR: 0.92
Prothrombin Time: 12.6 seconds (ref 11.4–15.0)

## 2014-05-23 LAB — T4: T4, Total: 3.8 ug/dL — ABNORMAL LOW (ref 4.5–12.0)

## 2014-05-23 LAB — THYROGLOBULIN ANTIBODY: Thyroglobulin Antibody: <1 IU/mL (ref 0.0–0.9)

## 2014-05-30 ENCOUNTER — Other Ambulatory Visit: Payer: Self-pay | Admitting: Cardiovascular Disease

## 2014-05-30 DIAGNOSIS — I209 Angina pectoris, unspecified: Secondary | ICD-10-CM

## 2014-05-31 ENCOUNTER — Inpatient Hospital Stay
Admission: RE | Admit: 2014-05-31 | Discharge: 2014-06-01 | DRG: 247 | Disposition: A | Discharge status: Home / Self Care | Payer: Medicare Other | Source: Ambulatory Visit | Attending: Cardiovascular Disease | Admitting: Cardiovascular Disease

## 2014-05-31 ENCOUNTER — Encounter: Payer: Self-pay | Admitting: *Deleted

## 2014-05-31 ENCOUNTER — Encounter
Admission: RE | Disposition: A | Discharge status: Home / Self Care | Payer: Medicare Other | Source: Ambulatory Visit | Attending: Cardiovascular Disease

## 2014-05-31 DIAGNOSIS — R9431 Abnormal electrocardiogram [ECG] [EKG]: Secondary | ICD-10-CM | POA: Diagnosis not present

## 2014-05-31 DIAGNOSIS — I209 Angina pectoris, unspecified: Secondary | ICD-10-CM | POA: Diagnosis present

## 2014-05-31 DIAGNOSIS — E119 Type 2 diabetes mellitus without complications: Secondary | ICD-10-CM | POA: Diagnosis present

## 2014-05-31 DIAGNOSIS — R110 Nausea: Secondary | ICD-10-CM | POA: Diagnosis not present

## 2014-05-31 DIAGNOSIS — R0602 Shortness of breath: Secondary | ICD-10-CM | POA: Diagnosis present

## 2014-05-31 DIAGNOSIS — I25119 Atherosclerotic heart disease of native coronary artery with unspecified angina pectoris: Secondary | ICD-10-CM | POA: Diagnosis present

## 2014-05-31 DIAGNOSIS — I10 Essential (primary) hypertension: Secondary | ICD-10-CM | POA: Diagnosis present

## 2014-05-31 DIAGNOSIS — I779 Disorder of arteries and arterioles, unspecified: Secondary | ICD-10-CM | POA: Diagnosis present

## 2014-05-31 DIAGNOSIS — E785 Hyperlipidemia, unspecified: Secondary | ICD-10-CM | POA: Diagnosis present

## 2014-05-31 DIAGNOSIS — I25118 Atherosclerotic heart disease of native coronary artery with other forms of angina pectoris: Secondary | ICD-10-CM | POA: Diagnosis present

## 2014-05-31 DIAGNOSIS — F419 Anxiety disorder, unspecified: Secondary | ICD-10-CM | POA: Diagnosis present

## 2014-05-31 DIAGNOSIS — E049 Nontoxic goiter, unspecified: Secondary | ICD-10-CM | POA: Diagnosis present

## 2014-05-31 DIAGNOSIS — I2510 Atherosclerotic heart disease of native coronary artery without angina pectoris: Secondary | ICD-10-CM

## 2014-05-31 DIAGNOSIS — D649 Anemia, unspecified: Secondary | ICD-10-CM | POA: Diagnosis present

## 2014-05-31 DIAGNOSIS — K209 Esophagitis, unspecified: Secondary | ICD-10-CM | POA: Diagnosis present

## 2014-05-31 DIAGNOSIS — I739 Peripheral vascular disease, unspecified: Secondary | ICD-10-CM

## 2014-05-31 HISTORY — DX: Personal history of other medical treatment: Z92.89

## 2014-05-31 HISTORY — DX: Atherosclerotic heart disease of native coronary artery without angina pectoris: I25.10

## 2014-05-31 HISTORY — PX: CARDIAC CATHETERIZATION: SHX172

## 2014-05-31 HISTORY — DX: Peripheral vascular disease, unspecified: I73.9

## 2014-05-31 HISTORY — DX: Disorder of arteries and arterioles, unspecified: I77.9

## 2014-05-31 LAB — GLUCOSE, CAPILLARY
Glucose-Capillary: 96 mg/dL (ref 65–99)
Glucose-Capillary: 144 mg/dL — ABNORMAL HIGH (ref 65–99)
Glucose-Capillary: 124 mg/dL — ABNORMAL HIGH (ref 65–99)

## 2014-05-31 LAB — PLATELET COUNT: Platelets: 147 10*3/uL — ABNORMAL LOW (ref 150–440)

## 2014-05-31 SURGERY — LEFT HEART CATH
Anesthesia: Moderate Sedation

## 2014-05-31 MED ORDER — OXYCODONE-ACETAMINOPHEN 5-325 MG PO TABS
1.0000 | ORAL_TABLET | ORAL | Status: DC | PRN
  Administered 2014-05-31: 1 via ORAL
  Filled 2014-05-31: qty 1

## 2014-05-31 MED ORDER — SODIUM CHLORIDE 0.9 % IV SOLN
250.0000 mg | INTRAVENOUS | Status: DC | PRN
  Administered 2014-05-31: 1.75 mg/kg/h via INTRAVENOUS

## 2014-05-31 MED ORDER — SODIUM CHLORIDE 0.9 % IV BOLUS (SEPSIS): 250.0000 mL | Freq: Once | INTRAVENOUS | Status: DC

## 2014-05-31 MED ORDER — ASPIRIN 81 MG PO CHEW
CHEWABLE_TABLET | ORAL | Status: AC
  Filled 2014-05-31: qty 4

## 2014-05-31 MED ORDER — TIROFIBAN HCL IV 12.5 MG/250 ML
INTRAVENOUS | Status: AC
  Filled 2014-05-31: qty 250

## 2014-05-31 MED ORDER — CLOPIDOGREL BISULFATE 75 MG PO TABS
ORAL_TABLET | ORAL | Status: AC
  Filled 2014-05-31: qty 8

## 2014-05-31 MED ORDER — CLOPIDOGREL BISULFATE 300 MG PO TABS
ORAL_TABLET | ORAL | Status: DC | PRN
  Administered 2014-05-31: 600 mg via ORAL

## 2014-05-31 MED ORDER — ALPRAZOLAM 0.5 MG PO TABS: 0.2500 mg | ORAL_TABLET | Freq: Every evening | ORAL | Status: DC | PRN

## 2014-05-31 MED ORDER — ONDANSETRON HCL 4 MG/2ML IJ SOLN
INTRAMUSCULAR | Status: AC
  Administered 2014-05-31: 14:00:00
  Filled 2014-05-31: qty 2

## 2014-05-31 MED ORDER — ASPIRIN 81 MG PO CHEW
CHEWABLE_TABLET | ORAL | Status: DC | PRN
  Administered 2014-05-31: 324 mg via ORAL

## 2014-05-31 MED ORDER — ADENOSINE 6 MG/2ML IV SOLN
INTRAVENOUS | Status: AC
  Filled 2014-05-31: qty 2

## 2014-05-31 MED ORDER — SODIUM CHLORIDE 0.9 % IJ SOLN
3.0000 mL | Freq: Two times a day (BID) | INTRAMUSCULAR | Status: DC
  Administered 2014-06-01: 3 mL via INTRAVENOUS

## 2014-05-31 MED ORDER — MIDAZOLAM HCL 2 MG/2ML IJ SOLN
INTRAMUSCULAR | Status: DC | PRN
  Administered 2014-05-31 (×2): 1 mg via INTRAVENOUS

## 2014-05-31 MED ORDER — FENTANYL CITRATE (PF) 100 MCG/2ML IJ SOLN
INTRAMUSCULAR | Status: DC | PRN
  Administered 2014-05-31: 25 ug via INTRAVENOUS

## 2014-05-31 MED ORDER — NITROGLYCERIN 0.2 MG/ML ON CALL CATH LAB
INTRAVENOUS | Status: DC | PRN
  Administered 2014-05-31 (×4): 200 ug via INTRACORONARY

## 2014-05-31 MED ORDER — ASPIRIN 81 MG PO CHEW: 81.0000 mg | CHEWABLE_TABLET | ORAL | Status: DC

## 2014-05-31 MED ORDER — ATORVASTATIN CALCIUM 20 MG PO TABS
20.0000 mg | ORAL_TABLET | Freq: Every day | ORAL | Status: DC
  Filled 2014-05-31: qty 1

## 2014-05-31 MED ORDER — BIVALIRUDIN BOLUS VIA INFUSION
INTRAVENOUS | Status: DC | PRN
  Administered 2014-05-31: 56.625 mg via INTRAVENOUS

## 2014-05-31 MED ORDER — CLOPIDOGREL BISULFATE 75 MG PO TABS
75.0000 mg | ORAL_TABLET | Freq: Every day | ORAL | Status: DC
  Administered 2014-06-01: 75 mg via ORAL
  Filled 2014-05-31: qty 1

## 2014-05-31 MED ORDER — NITROGLYCERIN 0.4 MG SL SUBL
0.4000 mg | SUBLINGUAL_TABLET | SUBLINGUAL | Status: DC | PRN
  Administered 2014-05-31: 0.4 mg via SUBLINGUAL
  Filled 2014-05-31: qty 1

## 2014-05-31 MED ORDER — SODIUM CHLORIDE 0.9 % IV SOLN: 250.0000 mL | INTRAVENOUS | Status: DC | PRN

## 2014-05-31 MED ORDER — SODIUM CHLORIDE 0.9 % IV SOLN
INTRAVENOUS | Status: DC | PRN
  Administered 2014-05-31: 250 mL via INTRAVENOUS

## 2014-05-31 MED ORDER — IOHEXOL 300 MG/ML  SOLN
INTRAMUSCULAR | Status: DC | PRN
  Administered 2014-05-31: 100 mL via INTRA_ARTERIAL
  Administered 2014-05-31: 30 mL via INTRA_ARTERIAL

## 2014-05-31 MED ORDER — ASPIRIN EC 81 MG PO TBEC
81.0000 mg | DELAYED_RELEASE_TABLET | Freq: Every day | ORAL | Status: DC
  Administered 2014-06-01: 81 mg via ORAL
  Filled 2014-05-31: qty 1

## 2014-05-31 MED ORDER — GABAPENTIN 100 MG PO CAPS
100.0000 mg | ORAL_CAPSULE | Freq: Three times a day (TID) | ORAL | Status: DC
  Administered 2014-05-31: 100 mg via ORAL
  Filled 2014-05-31 (×2): qty 1

## 2014-05-31 MED ORDER — SODIUM CHLORIDE 0.9 % IJ SOLN: 3.0000 mL | INTRAMUSCULAR | Status: DC | PRN

## 2014-05-31 MED ORDER — ACETAMINOPHEN 325 MG PO TABS: 650.0000 mg | ORAL_TABLET | ORAL | Status: DC | PRN

## 2014-05-31 MED ORDER — BIVALIRUDIN 250 MG IV SOLR
INTRAVENOUS | Status: AC
  Filled 2014-05-31: qty 250

## 2014-05-31 MED ORDER — NITROGLYCERIN 5 MG/ML IV SOLN
INTRAVENOUS | Status: AC
  Filled 2014-05-31: qty 10

## 2014-05-31 MED ORDER — PANTOPRAZOLE SODIUM 40 MG PO TBEC
40.0000 mg | DELAYED_RELEASE_TABLET | Freq: Every day | ORAL | Status: DC
  Filled 2014-05-31: qty 1

## 2014-05-31 MED ORDER — TIROFIBAN HCL IV 5 MG/100ML
INTRAVENOUS | Status: DC | PRN
  Administered 2014-05-31: 25 ug/kg/min via INTRAVENOUS

## 2014-05-31 MED ORDER — ONDANSETRON HCL 4 MG/2ML IJ SOLN
4.0000 mg | Freq: Four times a day (QID) | INTRAMUSCULAR | Status: DC | PRN
  Administered 2014-05-31: 4 mg via INTRAVENOUS
  Filled 2014-05-31: qty 2

## 2014-05-31 MED ORDER — SODIUM CHLORIDE 0.9 % IJ SOLN
3.0000 mL | Freq: Two times a day (BID) | INTRAMUSCULAR | Status: DC
  Administered 2014-05-31: 3 mL via INTRAVENOUS

## 2014-05-31 MED ORDER — ONDANSETRON HCL 4 MG/2ML IJ SOLN
INTRAMUSCULAR | Status: DC | PRN
Start: 2014-05-31 — End: 2014-05-31
  Administered 2014-05-31: 4 mg via INTRAVENOUS

## 2014-05-31 MED ORDER — SODIUM CHLORIDE 0.9 % WEIGHT BASED INFUSION: 1.0000 mL/kg/h | INTRAVENOUS | Status: DC

## 2014-05-31 MED ORDER — FENTANYL CITRATE (PF) 100 MCG/2ML IJ SOLN
INTRAMUSCULAR | Status: AC
Start: 2014-05-31 — End: 2014-05-31
  Filled 2014-05-31: qty 2

## 2014-05-31 MED ORDER — MIDAZOLAM HCL 2 MG/2ML IJ SOLN
INTRAMUSCULAR | Status: AC
  Filled 2014-05-31: qty 2

## 2014-05-31 MED ORDER — IOHEXOL 300 MG/ML  SOLN
INTRAMUSCULAR | Status: DC | PRN
  Administered 2014-05-31: 30 mL via INTRA_ARTERIAL
  Administered 2014-05-31: 80 mL via INTRA_ARTERIAL

## 2014-05-31 MED ORDER — SODIUM CHLORIDE 0.9 % IV SOLN
INTRAVENOUS | Status: AC
  Administered 2014-05-31: 16:00:00 via INTRAVENOUS

## 2014-05-31 MED ORDER — ONDANSETRON HCL 4 MG/2ML IJ SOLN
INTRAMUSCULAR | Status: AC
  Filled 2014-05-31: qty 2

## 2014-05-31 MED ORDER — METOPROLOL SUCCINATE ER 50 MG PO TB24
50.0000 mg | ORAL_TABLET | Freq: Every day | ORAL | Status: DC
  Administered 2014-06-01: 50 mg via ORAL
  Filled 2014-05-31: qty 1

## 2014-05-31 MED ORDER — ADENOSINE (DIAGNOSTIC) FOR INTRACORONARY USE
INTRAVENOUS | Status: DC | PRN
  Administered 2014-05-31 (×2): 240 ug via INTRACORONARY
  Administered 2014-05-31 (×2): 120 ug via INTRACORONARY
  Administered 2014-05-31: 240 ug via INTRACORONARY

## 2014-05-31 MED ORDER — SODIUM CHLORIDE 0.9 % WEIGHT BASED INFUSION: 3.0000 mL/kg/h | INTRAVENOUS | Status: DC

## 2014-05-31 MED ORDER — TIROFIBAN HCL IV 12.5 MG/250 ML
0.0750 ug/kg/min | INTRAVENOUS | Status: AC
  Administered 2014-05-31: 0.075 ug/kg/min via INTRAVENOUS
  Filled 2014-05-31: qty 250

## 2014-05-31 MED ORDER — RAMIPRIL 2.5 MG PO CAPS
2.5000 mg | ORAL_CAPSULE | Freq: Every day | ORAL | Status: DC
  Administered 2014-06-01: 2.5 mg via ORAL
  Filled 2014-05-31 (×3): qty 1

## 2014-05-31 MED ORDER — LEVOTHYROXINE SODIUM 50 MCG PO TABS
50.0000 ug | ORAL_TABLET | Freq: Every day | ORAL | Status: DC
  Administered 2014-06-01: 50 ug via ORAL
  Filled 2014-05-31: qty 1

## 2014-05-31 SURGICAL SUPPLY — 22 items
BALLN TREK OTW 2.5X8 (BALLOONS) ×3
BALLN TREK RX 2.5X15 (BALLOONS) ×3
BALLN ~~LOC~~ TREK RX 3.25X12 (BALLOONS) ×3
BALLOON TREK OTW 2.5X8 (BALLOONS) ×1 IMPLANT
BALLOON TREK RX 2.5X15 (BALLOONS) ×1 IMPLANT
BALLOON ~~LOC~~ TREK RX 3.25X12 (BALLOONS) ×1 IMPLANT
CATH INFINITI 5FR ANG PIGTAIL (CATHETERS) ×3 IMPLANT
CATH INFINITI 5FR JL4 (CATHETERS) ×3 IMPLANT
CATH INFINITI JR4 5F (CATHETERS) ×3 IMPLANT
CATH VISTA GUIDE 6FR JR4 (CATHETERS) ×3 IMPLANT
DEVICE CLOSURE MYNXGRIP 6/7F (Vascular Products) ×3 IMPLANT
DEVICE INFLAT 30 PLUS (MISCELLANEOUS) ×3 IMPLANT
KIT MANI 3VAL PERCEP (MISCELLANEOUS) ×3 IMPLANT
NEEDLE SMART 18G ACCESS (NEEDLE) ×3 IMPLANT
PACK CARDIAC CATH (CUSTOM PROCEDURE TRAY) ×3 IMPLANT
SHEATH AVANTI 5FR X 11CM (SHEATH) ×3 IMPLANT
SHEATH AVANTI 6FR X 11CM (SHEATH) ×3 IMPLANT
STENT XIENCE ALPINE RX 3.0X28 (Permanent Stent) ×3 IMPLANT
STENT XIENCE ALPINE RX 3.5X8 (Permanent Stent) ×3 IMPLANT
VALVE COPILOT STAT (MISCELLANEOUS) ×3 IMPLANT
WIRE EMERALD 3MM-J .035X150CM (WIRE) ×3 IMPLANT
WIRE RUNTHROUGH .014X300CM (WIRE) ×3 IMPLANT

## 2014-05-31 NOTE — Progress Notes (Signed)
RN made Dr. Percival Spanish aware over the telephone that patient's EKG result is in [facility]. Dr. Percival Spanish stated "it looks fine, no acute changes." no new orders given.  [name] B

## 2014-05-31 NOTE — Progress Notes (Signed)
Resting well with no complaints. Denies pain.

## 2014-05-31 NOTE — Progress Notes (Signed)
    Patient underwent outpatient cardiac cath today that showed Dist LAD lesion, 50% stenosis, Dist Cx 60% stenosis, 1st Mrg lesion 80% stenosis, 1st RPLB lesion 40% stenosis, Mid LAD lesion, 90% stenosis, Mid RCA-1 lesion 70% stenosed, Mid RCA-2 lesion 95% stenosis s/p PCI/DES there was a 0% residual stenosis post intervention, Prox RCA lesion 60% stenosis s/p PCI/DES there was a 0% residual stenosis post intervention.  Case details:   RCA PCI was performed. A long DES was placed in the mid segment. There was hazy calcified area proximal to the stent with suspected edge dissection. Thus, I decided to place a short overlapping stent. After deployment, there was slow flow noted with chest pain and inferior STE noted on ECG. I placed an over the wire balloon in the distal RCA and gave large doses of Adenosine with gradual improvement. There was TIMI 3 flow at the end.   Critical stenosis of the mid RCA (long lesion) and proximal to mid LAD, moderate mid LAD and mid LCX disease. Normal EF estimated at >55%. No significant AS or MR.    The LAD lesion is very calcified and at the takeoff of the diagonal branch. Recommendation to perform debulking/rotoblade and PCI at [facility] next week (staged intervention given the complexity of the LAD lesion). Plan was discussed with the patient.    She was placed on IV Aggrastat post procedure and will be on DAPT with aspirin and Plavix for the next 12 months.   Upon arriving to CCU she felt nauseated and and an isolated episode of emesis. No associated chest pain, SOB, diaphoresis, palpitations. She immediately felt better after vomiting. Vitals stable. She is currently chest pain free and resting comfortably.    Christell Faith, PA-C 05/31/2014 4:36 PM

## 2014-05-31 NOTE — Progress Notes (Addendum)
RN made Dr. Percival Spanish aware that patient complained of jaw pain at 1945 that was relieved by nitro sublingual and then started complaining of numbness to right arm and hand, but patient denies chest pain. Dr. Percival Spanish stated "you can get an ekg if you want." no further orders given by MD.  [name]

## 2014-05-31 NOTE — Progress Notes (Signed)
MEDICATION RELATED CONSULT NOTE - INITIAL   Pharmacy Consult for Tirofiban  Indication: post-cath  Allergies  Allergen Reactions  . Codeine Other (See Comments)    Reaction:  GI upset   . Petrolatum-Zinc Oxide Hives  . Celecoxib Other (See Comments)    Reaction:  Unknown   . Ciprofloxacin Nausea And Vomiting  . Metronidazole Nausea And Vomiting  . Neomycin-Bacitracin Zn-Polymyx Other (See Comments)    Reaction:  Unknown   . Statins Other (See Comments)    Reaction:  Muscle aches   . Sulfa Antibiotics Diarrhea  . Vioxx [Rofecoxib] Hives    Patient Measurements: Weight: 169 lb 8.5 oz (76.9 kg) Adjusted Body Weight: 62.2 kg  Vital Signs: Temp: 97.7 F (36.5 C) (05/19 1945) Temp Source: Axillary (05/19 1945) BP: 89/53 mmHg (05/19 2030) Pulse Rate: 73 (05/19 2030) Intake/Output from previous day:   Intake/Output from this shift: Total I/O In: 81.8 [I.V.:81.8] Out: -   Labs: No results for input(s): WBC, HGB, HCT, PLT, APTT, CREATININE, LABCREA, CREATININE, CREAT24HRUR, MG, PHOS, ALBUMIN, PROT, ALBUMIN, AST, ALT, ALKPHOS, BILITOT, BILIDIR, IBILI in the last 72 hours. Estimated Creatinine Clearance: 47.9 mL/min (by C-G formula based on Cr of 0.95).   Microbiology: No results found for this or any previous visit (from the past 720 hour(s)).  Medical History: Past Medical History  Diagnosis Date  . Hyperlipidemia   . Hypertension   . Osteoarthritis   . Mild reactive airways disease   . Chronic anxiety   . Esophagitis   . Diverticulosis   . History of colon polyps   . Type 2 diabetes mellitus   . Thyroid cancer     Medications:  Infusions:  . sodium chloride 75 mL/hr at 05/31/14 2000  . tirofiban 0.075 mcg/kg/min (05/31/14 2000)    Assessment: Patient is post-cath, consulted to order tirofiban drip for 12 hours. Patient received bolus dose and initial infusion in cath lab.  Goal of Therapy:  Check platelets 6 hours after start of infusion.  Plan:   Ordered tirofiban 0.047mcg/kg/min to finish 12 hours.  Paulina Fusi, PharmD, BCPS 05/31/2014 9:39 PM

## 2014-05-31 NOTE — Progress Notes (Signed)
RN paged Dr. Percival Spanish to make MD aware that patient is complaining of numbness in right hand and that patient had complained of jaw pain prior that was relieved by nitro sublingual.

## 2014-06-01 ENCOUNTER — Other Ambulatory Visit: Payer: Self-pay | Admitting: Physician Assistant

## 2014-06-01 ENCOUNTER — Encounter: Payer: Self-pay | Admitting: Physician Assistant

## 2014-06-01 ENCOUNTER — Telehealth: Payer: Self-pay

## 2014-06-01 ENCOUNTER — Other Ambulatory Visit: Payer: Self-pay

## 2014-06-01 DIAGNOSIS — I25118 Atherosclerotic heart disease of native coronary artery with other forms of angina pectoris: Secondary | ICD-10-CM | POA: Diagnosis present

## 2014-06-01 DIAGNOSIS — I779 Disorder of arteries and arterioles, unspecified: Secondary | ICD-10-CM | POA: Diagnosis present

## 2014-06-01 DIAGNOSIS — I10 Essential (primary) hypertension: Secondary | ICD-10-CM | POA: Diagnosis present

## 2014-06-01 DIAGNOSIS — I2510 Atherosclerotic heart disease of native coronary artery without angina pectoris: Secondary | ICD-10-CM

## 2014-06-01 DIAGNOSIS — E785 Hyperlipidemia, unspecified: Secondary | ICD-10-CM | POA: Diagnosis present

## 2014-06-01 DIAGNOSIS — I739 Peripheral vascular disease, unspecified: Secondary | ICD-10-CM

## 2014-06-01 DIAGNOSIS — R079 Chest pain, unspecified: Secondary | ICD-10-CM

## 2014-06-01 LAB — CBC
HCT: 30.9 % — ABNORMAL LOW (ref 35.0–47.0)
Hemoglobin: 10.2 g/dL — ABNORMAL LOW (ref 12.0–16.0)
MCH: 29.8 pg (ref 26.0–34.0)
MCHC: 33.1 g/dL (ref 32.0–36.0)
MCV: 90.1 fL (ref 80.0–100.0)
Platelets: 159 10*3/uL (ref 150–440)
RBC: 3.43 MIL/uL — AB (ref 3.80–5.20)
RDW: 15.6 % — ABNORMAL HIGH (ref 11.5–14.5)
WBC: 7.3 10*3/uL (ref 3.6–11.0)

## 2014-06-01 LAB — BASIC METABOLIC PANEL
Anion gap: 6 (ref 5–15)
BUN: 23 mg/dL — ABNORMAL HIGH (ref 6–20)
CALCIUM: 8.5 mg/dL — AB (ref 8.9–10.3)
CHLORIDE: 110 mmol/L (ref 101–111)
CO2: 25 mmol/L (ref 22–32)
CREATININE: 1.1 mg/dL — AB (ref 0.44–1.00)
GFR calc Af Amer: 54 mL/min — ABNORMAL LOW (ref >60)
GFR, EST NON AFRICAN AMERICAN: 47 mL/min — AB (ref >60)
GLUCOSE: 127 mg/dL — AB (ref 65–99)
POTASSIUM: 4.4 mmol/L (ref 3.5–5.1)
Sodium: 141 mmol/L (ref 135–145)

## 2014-06-01 LAB — CK TOTAL AND CKMB (NOT AT ARMC)
CK TOTAL: 162 U/L (ref 38–234)
CK, MB: 13.8 ng/mL — AB (ref 0.5–5.0)
Relative Index: 8.5 — ABNORMAL HIGH (ref 0.0–2.5)

## 2014-06-01 LAB — TROPONIN I: Troponin I: 0.94 ng/mL — ABNORMAL HIGH (ref <0.031)

## 2014-06-01 MED ORDER — PANTOPRAZOLE SODIUM 40 MG PO TBEC: 40.0000 mg | DELAYED_RELEASE_TABLET | Freq: Every day | ORAL | Status: DC

## 2014-06-01 MED ORDER — CLOPIDOGREL BISULFATE 75 MG PO TABS: 75.0000 mg | ORAL_TABLET | Freq: Every day | ORAL | Status: DC

## 2014-06-01 NOTE — Discharge Instructions (Signed)
Arteriogram  An arteriogram (or angiogram) is an X-ray test of your blood vessels. You will be awake during the test. This test looks for:  Blocked blood vessels.  Blood vessels that are not normal. BEFORE THE TEST Schedule an appointment for your arteriogram. Let the person know if:  You have diabetes.  You are allergic to any food or medicine.  You are allergic to X-ray dye (contrast).  You have asthma or had it when you were a child.  You are pregnant or you might be pregnant.  You are taking aspirin or blood thinners. The night before the test:   Do not  eat or drink anything after midnight. On the morning of your test:   Do not drive. Have someone bring you and take you home.  Bring all the medicines you need to take with you. If you have diabetes, do not take your insulin before the test, but bring it with you. THE TEST  You will lie on the X-ray table.  An IV tube is started in your arm.  Your blood pressure and heartbeat are checked during the test.  The upper part of your leg (groin) is shaved and washed with special soap.  You are then covered with a germ free (sterile) sheet. Keep your arms at your side under the sheet at all times so that germs do not get on the sheet.  The skin is numbed where the thin tube (catheter) is put into your upper leg. The thin tube is moved up into the blood vessels that your doctor wants to see.  Dye is put in through the tube. You may have a feeling of heat as the dye moves into your body.  X-ray pictures of your blood vessels are taken. Do not move while the dye goes in and pictures are taken. AFTER THE TEST  The thin tube will be taken out of your upper leg.   Coronary Artery Disease Coronary artery disease (CAD) is a process in which the heart (coronary) arteries narrow or become blocked from the development of atherosclerosis. Atherosclerosis is a disease in which plaque builds up on the inside of the heart arteries  (coronary arteries). Plaque is made up of fats (lipids), cholesterol, calcium, and fibrous tissue. CAD can lead to a heart attack (myocardial infarction, MI). An MI can lead to heart failure, cardiogenic shock, or sudden cardiac death. CAD can cause an MI through: Plaque buildup that can severely narrow or block the coronary arteries and diminish blood flow. Plaque that can become unstable and "rupture." Unstable plaque that ruptures within a coronary artery can form a clot and cause a sudden (acute) blockage. RISK FACTORS Many risk factors contribute to the development of CAD. These include: High cholesterol (dyslipidemia) levels. High blood pressure (hypertension). Smoking. Diabetes. Age. Gender. Men can develop CAD earlier in life than women. Family history. Inactivity or lack of regular physical or aerobic exercise. A diet high in saturated fats. Chronic kidney disease. SYMPTOMS  When a coronary artery is narrowed or blocked, an MI can occur. MI symptoms can include: Chest pain (agina). Angina can occur by itself or it can also occur with pain in the neck, arm, jaw, or in the upper, middle back (mid-scapular pain). Profuse sweating (diaphoresis) without physical activity or movement. Shortness of breath (dyspnea). Irregular heartbeats (palpitations) that feel like your heart is skipping beats or is beating very fast. Nausea. Epigastric pain. Epigastric pain may occur as "heartburn." Tiredness (malaise). This can especially be  present in the elderly. Women can have different (atypical) symptoms other than classic angina.  DIAGNOSIS  The diagnosis of CAD may include: An electrocardiography (ECG). An ECG does not diagnose CAD, but it is usefull in the detection of a sudden (acute) MI or as a marker for a previous MI. Depending on which heart (coronary) artery may be blocked, an ECG may not pick up an MI pattern. Exercise stress test. A stress test can be performed at rest for people who  are unable to do an exercise stress test. A stress test will only be abnormal if one or more of the large coronary arteries is significantly blocked. Blood tests. Tests may include samples to detect heart muscle damage (such as troponin levels). Other tests may include cholesterol checks and an inflammation test (high-sensitivity C-reactive protein, hs-CRP). Coronary angiography. Screening people who have peripheral vascular disease (PAD). These people often times have CAD. TREATMENT  The treatment of CAD includes the following: Lifestyle changes such as: Following a heart-healthy diet. A registered dietitian can you help educate you on healthy food options and changes. Quiting smoking. Following an exercise program approved by your caregiver. Maintaining a healthy weight. Lose weight as approved by your caregiver. Medicines to help control your blood pressure, cholesterol level, angina, and blood clotting. Medicines may include beta-blockers, ACE inhibitors, statins, nitrates, and anti-platelet medicines. If you have a heart stent and are taking anti-platelet medicine, it is important to not suddenly stop taking this medicine. Suddenly stopping anti-platelet medicine can result in an MI. Talk with your caregiver before stopping medicine or if you cannot afford your medicine. If the coronary arteries are significantly blocked, surgery may be needed. This can include: Percutaneous coronary intervention (PCI) with or without stent placement. Coronary artery bypass graft surgery (CABG). SEEK IMMEDIATE MEDICAL CARE IF:  You develop MI symptoms. This is a medical emergency. Get help at once. Call your local emergency service (911 in the [place] immediately. Do not drive yourself to the clinic or hospital. MI symptoms can include: Angina or pain that occurs in the neck, arm, jaw, or in the upper middle back. Profuse sweating without cause. Shortness of breath or difficulty breathing without  cause. Unexplained nausea or epigastric pain that feels like heartburn. Document Released: 03/23/2011 Document Reviewed: 05/02/2013 [facility] Patient Information 2015 [place]. This information is not intended to replace advice given to you by your health care provider. Make sure you discuss any questions you have with your health care provider.  The nurse will check:  Your blood pressure.  The place where the thin tube was taken out to make sure it is not bleeding.  The blood flow to your feet.  Lie flat in bed. Keep your leg straight for 6 hours after the thin tube is taken out.  Ask your doctor or nurse if you should take your regular medications that you brought. Finding out the results of your test Ask when your test results will be ready. Make sure you get your test results. Document Released: 03/27/2008 Document Revised: 01/03/2013 Document Reviewed: 03/27/2008 [facility] Patient Information 2015 [place], [place]. This information is not intended to replace advice given to you by your health care provider. Make sure you discuss any questions you have with your health care provider. You can remove your bandage and wash with soap and water when you shower. Monitor site for signs of infection including reddness, heat, swelling, fever greater than 100.4,  and pus drainage. If you notice any signs of infection,  call your cardiologist.

## 2014-06-01 NOTE — Evaluation (Signed)
Physical Therapy Evaluation Patient Details Name: [name] MRN: [id number] DOB: 1933/05/07 Today's Date: 06/01/2014   History of Present Illness  81 yo female with onset of  chest pain was admitted and  has angina with CAD.  Recent CA surgery and feeling weaker generally.    Clinical Impression  Pt was seen for assessment of her tolerance for mobility with O2 sats 97-100% and pulse 100 to 108 on telemetry.  Pt is leaving with her husband today and did need to haveHHPT to follow up for strengthening at home    Follow Up Recommendations Home health PT    Equipment Recommendations  None recommended by PT    Recommendations for Other Services       Precautions / Restrictions Precautions Precautions: Fall (telemetry) Precaution Comments: using a SPC vs walker usually Restrictions Weight Bearing Restrictions: No      Mobility  Bed Mobility Overal bed mobility: Needs Assistance Bed Mobility: Supine to Sit;Sit to Supine     Supine to sit: Min assist Sit to supine: Min assist   General bed mobility comments: assisting her with LE;s and under trunk  Transfers Overall transfer level: Needs assistance Equipment used: 4-wheeled walker;1 person hand held assist Transfers: Sit to/from [facility] Sit to Stand: Min assist Stand pivot transfers: Min guard;Min assist          Ambulation/Gait Ambulation/Gait assistance: Min guard;Min assist Ambulation Distance (Feet): 110 Feet Assistive device: 4-wheeled walker;1 person hand held assist Gait Pattern/deviations: Step-through pattern;Wide base of support;Trunk flexed;Shuffle Gait velocity: slow Gait velocity interpretation: Below normal speed for age/gender General Gait Details: step through and min cues for direction and controlling pace  Stairs            Wheelchair Mobility    Modified Rankin (Stroke Patients Only)       Balance Overall balance assessment: Needs  assistance Sitting-balance support: Feet supported Sitting balance-Leahy Scale: Fair   Postural control: Posterior lean Standing balance support: Bilateral upper extremity supported Standing balance-Leahy Scale: Poor Standing balance comment: Poor+                             Pertinent Vitals/Pain Pain Assessment: Faces Faces Pain Scale: Hurts little more Pain Location: shoulder and back Pain Intervention(s): Limited activity within patient's tolerance;Monitored during session    [place] expects to be discharged to:: Private residence Living Arrangements: Spouse/significant other Available Help at Discharge: Family Type of Home: House Home Access: Level entry     Home Layout: One level Home Equipment: Environmental consultant - 4 wheels;Cane - single point;Shower seat      Prior Function Level of Independence: Independent with assistive device(s)               Hand Dominance   Dominant Hand: Right    Extremity/Trunk Assessment   Upper Extremity Assessment: Generalized weakness           Lower Extremity Assessment: Generalized weakness      Cervical / Trunk Assessment: Kyphotic  Communication   Communication: No difficulties  Cognition Arousal/Alertness: Awake/alert Behavior During Therapy: WFL for tasks assessed/performed Overall Cognitive Status: Within Functional Limits for tasks assessed                      General Comments General comments (skin integrity, edema, etc.): Planning to be home with husband and to have HHPT    Exercises Other Exercises Other Exercises: Strength  BLE's is 4- to 4+      Assessment/Plan    PT Assessment Patient needs continued PT services  PT Diagnosis Generalized weakness   PT Problem List Decreased strength;Decreased range of motion;Decreased activity tolerance;Decreased balance;Decreased mobility;Decreased coordination;Decreased knowledge of use of DME;Decreased safety  awareness;Cardiopulmonary status limiting activity;Pain;Decreased skin integrity  PT Treatment Interventions DME instruction;Gait training;Stair training;Functional mobility training;Therapeutic activities;Therapeutic exercise;Balance training;Neuromuscular re-education;Patient/family education   PT Goals (Current goals can be found in the Care Plan section) Acute Rehab PT Goals Patient Stated Goal: to get home safely PT Goal Formulation: With patient Time For Goal Achievement: 06/15/14 Potential to Achieve Goals: Good    Frequency Min 2X/week   Barriers to discharge Inaccessible home environment husband has Parkinson's disease    Co-evaluation               End of Session Equipment Utilized During Treatment: Gait belt Activity Tolerance: Patient tolerated treatment well Patient left: in bed;with call bell/phone within reach;with bed alarm set;with family/visitor present Nurse Communication: Mobility status         Time: 3953-2023 PT Time Calculation (min) (ACUTE ONLY): 32 min   Charges:   PT Evaluation $Initial PT Evaluation Tier I: 1 Procedure PT Treatments $Gait Training: 8-22 mins   PT G CodesRamond Dial 07-01-14, 1:19 PM  Mee Hives, PT MS Acute Rehab Dept. Number: [facility] [id number] and [place] [phone]

## 2014-06-01 NOTE — Progress Notes (Signed)
A&Ox4. Denies pain. Rested well all night. Up to bedside commode this morning to void. Very unsteady on her feet. Uses cane and walker at home. NSR with PVCs per cardiac monitor. Afebrile. VSS. No respiratory distress. Right groin site free of complications with scant amount of dry drainage only that is unchanged from initial assessment last night. +1 bilateral pedal pulses. Diet advanced to carb controlled. Call bell in reach.  [name] B

## 2014-06-01 NOTE — Plan of Care (Signed)
Problem: Phase II Progression Outcomes Goal: Ambulates up to 600 ft. in hall x 1 Outcome: Not Progressing Very unsteady gate. Needs PT consult Goal: Tolerates diet Outcome: Progressing Diet advanced for breakfast this a.m.

## 2014-06-01 NOTE — Progress Notes (Signed)
Pt had one IV removed with catheter intact. Discharge instructions reviewed and signed. Pt wheeled out by NT via wheelchair.

## 2014-06-01 NOTE — H&P (Signed)
SUBJECTIVE: No chest pain, shortness of breath or nausea since last night.    Filed Vitals:   06/01/14 0500 06/01/14 0600 06/01/14 0700 06/01/14 0800  BP: 121/54 128/52 144/56 121/63  Pulse: 72 69 80 75  Temp: 98.3 F (36.8 C)   98 F (36.7 C)  TempSrc: Oral   Axillary  Resp: 15 14 17 15   Height:      Weight:      SpO2: 98% 98% 96% 98%    Intake/Output Summary (Last 24 hours) at 06/01/14 0823 Last data filed at 06/01/14 0500  Gross per 24 hour  Intake  829.7 ml  Output    650 ml  Net  179.7 ml    LABS: Basic Metabolic Panel:  Recent Labs  06/01/14 0653  NA 141  K 4.4  CL 110  CO2 25  GLUCOSE 127*  BUN 23*  CREATININE 1.10*  CALCIUM 8.5*   Liver Function Tests: No results for input(s): AST, ALT, ALKPHOS, BILITOT, PROT, ALBUMIN in the last 72 hours. No results for input(s): LIPASE, AMYLASE in the last 72 hours. CBC:  Recent Labs  05/31/14 2123 06/01/14 0653  WBC  --  7.3  HGB  --  10.2*  HCT  --  30.9*  MCV  --  90.1  PLT 147* 159   Cardiac Enzymes:  Recent Labs  06/01/14 0653  CKTOTAL 162  CKMB 13.8*  TROPONINI 0.94*   BNP: Invalid input(s): POCBNP D-Dimer: No results for input(s): DDIMER in the last 72 hours. Hemoglobin A1C: No results for input(s): HGBA1C in the last 72 hours. Fasting Lipid Panel: No results for input(s): CHOL, HDL, LDLCALC, TRIG, CHOLHDL, LDLDIRECT in the last 72 hours. Thyroid Function Tests: No results for input(s): TSH, T4TOTAL, T3FREE, THYROIDAB in the last 72 hours.  Invalid input(s): FREET3 Anemia Panel: No results for input(s): VITAMINB12, FOLATE, FERRITIN, TIBC, IRON, RETICCTPCT in the last 72 hours.   PHYSICAL EXAM General: Well developed, well nourished, in no acute distress HEENT:  Normocephalic and atramatic Neck:  No JVD.  Lungs: Clear bilaterally to auscultation and percussion. Heart: HRRR . Normal S1 and S2 without gallops or murmurs.  Abdomen: Bowel sounds are positive, abdomen soft and  non-tender  Msk:  Back normal, normal gait. Normal strength and tone for age. Extremities: No clubbing, cyanosis or edema.   Neuro: Alert and oriented X 3. Psych:  Good affect, responds appropriately No groin hematoma.   TELEMETRY: Reviewed telemetry pt in NSR  ASSESSMENT AND PLAN:  1. CAD with class 3 angina: Cardiac cath showed severe 2 vessel CAD.  RCA PCI was performed. A long DES was placed in the mid segment. There was hazy calcified area proximal to the stent with suspected edge dissection. Thus, I decided to place a short overlapping stent. After deployment, there was slow flow noted with chest pain and inferior STE noted on ECG. I placed an over the wire balloon in the distal RCA and gave large doses of Adenosine with gradual improvement. There was TIMI 3 flow at the end.   ECG is back to normal. Surprisingly, the cardiac enzymes are not as high as I though they would be. Thus, she did not have a post procedure MI.  Ambulate today with PT and if stable, discharge home on current medications.   The LAD lesion is very calcified and at the takeoff of the diagonal branch. Recommendation to perform debulking/rotoblade and PCI at [facility] next week with me on Wednesday. I extensively discussed risks  and benefits.    2. Hypertension: BP is controlled.   3. Hyperlipidemia: continue Atorvastatin.   Kathlyn [place], MD, [facility] 06/01/2014 8:23 AM

## 2014-06-01 NOTE — Progress Notes (Signed)
Previously talked with Dr. Fletcher Anon at bedside this morning at [facility] and informed him of positive Troponin of 0.94. He stated "that is to be expected". Kathi Simpers, RN 06/01/14 5:05 PM

## 2014-06-01 NOTE — Discharge Summary (Signed)
Discharge Summary   Patient ID: [name] MRN: [id number], DOB/AGE: 14-Sep-1933 81 y.o. Admit date: 05/31/2014 D/C date:     06/01/2014  Primary Care Provider: Idelle Crouch, MD Primary Cardiologist: Dr. Rockey Situ, MD   Primary Discharge Diagnoses:  CAD s/p PCI/DES 79/71 complicated by inferior ST elevation post procedure  Secondary Discharge Diagnoses:  1. Thyroid disease - Goiter 2. History of 1.2 cm complex right lobe thyroid nodule s/p right sided hemithyroidectomy 2015 3. Moderate carotid disease 4. HTN 5. HLD 6. DM2 7. Esophagitis  8. History of anemia  9. Anxiety   Hospital Course:  81 year old female with history of CAD s/p PCI/DES 4/50 complicated by inferior ST elevation post procedure, thyroid disease, history of 1.2 cm complex right lobe thyroid nodule found incidentally on carotid artery [place], s/p right sided hemithyroidectomy in 2015, moderate carotid disease noted 09/29/2005, minimal bilateral plaque left greater than right on repeat ultrasound September 2015, scheduled to have radioactive thyroid treatment in the near future, approximately 2-3 weeks, HTN, and HLD who presented to [facility] on 05/31/2014 for outpatient cardiac catheterization.    Previously patient of Dr. Clayborn Bigness who was seeing patient for angina in 08/2013 with work up that included at that time an echo that showed normal LV systolic function, no valvular regurgitation or stenosis. She also had a stress test done at that time that showed normal wall motion, no evidence of ischemia, EF 50-55%. In follow up 09/2013 she continued to have angina and was treated medically.   She presented to see Dr Rockey Situ 03/2014 for evaluation of fatigue, chest heaviness. She was concerned about her symptoms as she has a strong family history of CAD, including a sister who has severe coronary artery disease. She's had no energy for several months now, with symptoms worse in the morning. She does not do any regular  exercise. She also reported having pain in her arms, up one and down the other. Her bilateral arms felt tired, and weak. She had been frequently waking up with heaviness in her chest around 2 or 3 in the morning. This had happened 10-12 times total at the time she saw Dr. Rockey Situ. Shedenied having any sleep apnea symptoms. In 08/2013 she was experiencing increasing chest pain. Work up at that time included echo done by prior cardiologist at [facility] that showed   EKG on her visit with Dr. Rockey Situ 03/2014 showed normal sinus rhythm with no significant ST or T-wave changes. She was unable to treadmill 2/2 leg weakness. She was offered multiple evaluation options including stress testing (though she was unable to treadmill 2/2 leg weakness), calcium score, and cardiac catheterization. She preferred cardiac cath.   She underwent outpatient cardiac cath on 05/31/2014 that showed mid LAD 90% stenosis, dist LAD 50% stenosis, dist LCx 60%, 1st marginal 80% stenosis, prox RCA 60% stenosis s/p PCI/DES, mid RCA 1st lesion 70% stenosis, mid RCA 2nd lesion 95% s/p PCI/long DES to cover entire mid RCA, RPLB 40% stenosis. There was hazy calcified area proximal to the stent with suspected edge dissection. Thus, it decided to place a short overlapping stent. After deployment, there was slow flow noted with chest pain and inferior STE noted on EKG. An over the wire balloon was placed in the distal RCA and the patient was given large doses of Adenosine with gradual improvement. There was TIMI 3 flow at the end. The LAD lesion is very calcified and at the takeoff of the diagonal branch. It was  recommended to perform debulking/rotoblade and PCI at [facility] the following week (staged intervention given the complexity of the LAD lesion). She is scheduled to undergo orbital arthrectomy and PCI at [facility] with Dr. Fletcher Anon on 5/25 at 10 AM.   She was ultimately transferred to CCU in stable condition. In post procedure follow up she was doing well and  with out chest pain or SOB. She did have 1 episode of emesis and felt immediately better afterwards.   Labs the following morning showed a troponin of 0.94 and a CK-MB of 13.8. SCr stable. Hgb down slightly to 10.2 from pre-cath level of 12.3 (10 days prior), possibly 2/2 hemodilution. Recommend recheck early next week. PT ambulated patient given her baseline usage with a cane. PT felt she could benefit from home health. Given her CK-MB was less than 3 x the upper limits of normal (normal being 5), improved EKG being back to baseline, and symptom free nature; she did not have a post procedure MI.   Will have her hold her metformin until 48-hours post cath 2nd cath for simplicity sake. We will change her Prilosec to Protonix.  Medications were esribed to [facility] in [place]. I called to verify they did receive both. I will go ahead and schedule her a hospital follow up with Dr. Rockey Situ for after her discharge from [facility]. Should this need to be changed for any reason the office can be called.      She was felt to be stable for discharge with planned outpatient cardiac cath at [facility] on 06/06/2014 with debulking/rotoblade and PCI. She was seen by Dr. Fletcher Anon, MD today and felt to stable for discharge.   Consults: PT and case management  Discharge Vitals: Blood pressure 111/40, pulse 86, temperature 98 F (36.7 C), temperature source Axillary, resp. rate 15, height '5\' 3"'  (1.6 m), weight 169 lb 8.5 oz (76.9 kg), SpO2 98 %.  Labs: Lab Results  Component Value Date   WBC 7.3 06/01/2014   HGB 10.2* 06/01/2014   HCT 30.9* 06/01/2014   MCV 90.1 06/01/2014   PLT 159 06/01/2014     Recent Labs Lab 06/01/14 0653  NA 141  K 4.4  CL 110  CO2 25  BUN 23*  CREATININE 1.10*  CALCIUM 8.5*  GLUCOSE 127*    Recent Labs  06/01/14 0653  CKTOTAL 162  CKMB 13.8*  TROPONINI 0.94*    Diagnostic Studies/Procedures   Cardiac catheterization 05/31/2014:  Coronary Findings     Dominance: Right   Left Anterior Descending   . Mid LAD lesion, 90% stenosed. The lesion is type C, moderately calcified, diffuse . The lesion was not previously treated.   Jorene Minors LAD lesion, 50% stenosed. calcified, diffuse .     Ramus Intermedius  The vessel , is small . There is moderatethe vessel.     Left Circumflex   . Dist Cx lesion, 60% stenosed. diffuse .   Marland Kitchen First Obtuse Marginal Branch   The vessel is small in size and exhibits minimal luminal irregularities.   . 1st Mrg lesion, 80% stenosed. diffuse .     Right Coronary Artery   . Prox RCA lesion, 60% stenosed.   Marland Kitchen PCI: The pre-interventional distal flow is normal (TIMI 3). No pre-stent angioplasty was performed. An unspecified stent was placed. No post-stent angioplasty was performed. Maximum pressure: 14 atm. The post-interventional distal flow is normal (TIMI 3). The intervention was successful. The patient experienced the no reflow phenomenon in this vessel.  Slow flow noted after deploying the proximal stent.  . There is no residual stenosis post intervention.     . Mid RCA-1 lesion, 70% stenosed. The lesion is type C, calcified, diffuse .   Marland Kitchen PCI: The pre-interventional distal flow is normal (TIMI 3). Pre-stent angioplasty was performed. A drug-eluting stent was placed. The strut is apposed. Post-stent angioplasty was performed. Lesion length: 25 mm. Maximum pressure: 14 atm. The post-interventional distal flow is normal (TIMI 3). The intervention was successful.  . There is no residual stenosis post intervention.     . Mid RCA-2 lesion, 95% stenosed. The lesion is type C, mildly calcified, diffuse .   Marland Kitchen PCI: The pre-interventional distal flow is normal (TIMI 3). Pre-stent angioplasty was performed. A drug-eluting stent was placed. The strut is apposed. Post-stent angioplasty was performed. Lesion length: 25 mm. Maximum pressure: 14 atm. The post-interventional distal flow is normal (TIMI 3). The intervention was  successful.  . There is no residual stenosis post intervention.     . First Right Posterolateral   . 1st RPLB lesion, 40% stenosed. calcified, diffuse       Dg Chest 2 View  05/23/2014   CLINICAL DATA:  Cardiac catheterization 05/27/2014. Family history of heart disease.  EXAM: CHEST  2 VIEW  COMPARISON:  None.  FINDINGS: There is no focal parenchymal opacity. There is no pleural effusion or pneumothorax. The heart and mediastinal contours are unremarkable.  Partially visualized is posterior lumbar fusion hardware. Osteoarthritis of bilateral shoulders. Prior left rotator cuff repair.  IMPRESSION: No active cardiopulmonary disease.   Electronically Signed   By: Kathreen Devoid   On: 05/23/2014 15:41   Nm Rai Therapy Cancer Thyroid  05/17/2014   CLINICAL DATA:  Papillary thyroid carcinoma. T3 disease with extrathyroidal extension. Patient presents for remnant ablation and adjuvant therapy. 81 year old female.  EXAM: RADIOACTIVE IODINE THERAPY FOR THYROID CANCER  PROCEDURE: The risks and benefits of radioactive iodine therapy were discussed with the patient in detail. Alternative therapies were also mentioned. Radiation safety was discussed with the patient, including how to protect the general public from exposure. There were no barriers to communication. Written consent was obtained. The patient then received a capsule containing the radiopharmaceutical. Dr Kathlene Cote administered dose. Dose calculated by Dr. Candise Che.  The patient will follow-up with the referring physician.  RADIOPHARMACEUTICALS:  10.6 mCi I-131 sodium iodide  FINDINGS: 106 mCi I 131 administered on 05/08/2014 for remnant ablation and adjuvant therapy for T3 papillary thyroid carcinoma.  IMPRESSION: Per oral administration of I-131 sodium iodide for thyroid remnant ablation / thyroid cancer adjuvant therapy.   Electronically Signed   By: Suzy Bouchard M.D.   On: 05/17/2014 22:03   Nm Whole Body I131 Scan S/p Ca Rx  05/17/2014    CLINICAL DATA:  Papillary thyroid carcinoma. T3 N0 disease. Extrathyroidal extension present. Patient received 100 mCi I 131 for adjuvant therapy on 4 07/02/2014  EXAM: NUCLEAR MEDICINE I-131 POST THERAPY WHOLE BODY SCAN  TECHNIQUE: The patient received 100 mCi I-131 sodium iodide for the treatment of thyroid cancer within the past 10 days. The patient returns today, and whole body scanning was performed in the anterior and posterior projections.  COMPARISON:  None.  FINDINGS: Intense uptake within thyroid bed consistent with remnant thyroid tissue. Cannot exclude residual thyroid carcinoma. The no evidence of abnormal uptake on the whole-body scan to suggest distant metastasis.  IMPRESSION: 1. No evidence of distant thyroid cancer metastasis. 2. Intense activity in thyroid bed represent remnant  thyroid tissue. Cannot exclude residual thyroid carcinoma.   Electronically Signed   By: Suzy Bouchard M.D.   On: 05/17/2014 21:57    Discharge Medications   Current Discharge Medication List    CONTINUE these medications which have NOT CHANGED   Details  ALPRAZolam (XANAX) 0.25 MG tablet Take 0.25 mg by mouth at bedtime as needed.     Calcium Carbonate-Vitamin D (CALCIUM-VITAMIN D) 500-200 MG-UNIT per tablet Take 2 tablets by mouth 2 (two) times daily.    Glucose Blood (BLOOD GLUCOSE TEST STRIPS) STRP Use once daily. Use as instructed.    levothyroxine (SYNTHROID, LEVOTHROID) 50 MCG tablet Take 50 mcg by mouth daily before breakfast.  Refills: 0    nitroGLYCERIN (NITROSTAT) 0.4 MG SL tablet Place 1 tablet (0.4 mg total) under the tongue every 5 (five) minutes as needed for chest pain. Qty: 25 tablet, Refills: 6    aspirin EC 81 MG tablet Take 81 mg by mouth daily.     atorvastatin (LIPITOR) 20 MG tablet Take 20 mg by mouth daily at 6 PM.     Blood Glucose Monitoring Suppl (FIFTY50 GLUCOSE METER 2.0) W/DEVICE KIT Use as directed (Countour Meter).    Cholecalciferol 1000 UNITS tablet Take 1,000  Units by mouth daily.     clopidogrel (PLAVIX) 75 MG tablet Take 1 tablet (75 mg total) by mouth daily. Qty: 30 tablet, Refills: 11   Associated Diagnoses: Coronary artery disease involving native coronary artery of native heart without angina pectoris    gabapentin (NEURONTIN) 100 MG capsule Take 100 mg by mouth 3 (three) times daily.     Multiple Vitamins-Minerals (PRESERVISION AREDS PO) Take by mouth 2 (two) times daily.    oxyCODONE-acetaminophen (PERCOCET/ROXICET) 5-325 MG per tablet Take 1-2 tablets by mouth every 4 (four) hours as needed for moderate pain (every 4 - 6 hours as needed for mild pain or temp greater than 100.4).    ramipril (ALTACE) 2.5 MG capsule Take 2.5 mg by mouth daily.     TOPROL XL 50 MG 24 hr tablet Take 50 mg by mouth.     Turmeric 500 MG CAPS Take 500 mg by mouth daily.      STOP taking these medications     omeprazole (PRILOSEC) 20 MG capsule      ONETOUCH DELICA LANCETS 91Y MISC      amoxicillin-clavulanate (AUGMENTIN) 875-125 MG per tablet      metFORMIN (GLUCOPHAGE) 500 MG tablet      nitrofurantoin, macrocrystal-monohydrate, (MACROBID) 100 MG capsule      pantoprazole (PROTONIX) 40 MG tablet         Disposition   The patient will be discharged in stable condition to home.  Follow-up Information    Follow up with Ida Rogue, MD On 07/06/2014.   Specialty:  Cardiology   Why:  Appointment time: 7:45 AM   Contact information:   [place], [place] [address] [phone]       Follow up with Idelle Crouch, MD On 06/29/2014.   Specialty:  Internal Medicine   Why:  Appointment time: 12:30 PM   Contact information:   [place] [address] [phone]         Duration of Discharge Encounter: Greater than 30 minutes including physician and PA time.  Melvern Banker, PA-C Pager: [phone] 06/01/2014, 3:40 PM

## 2014-06-01 NOTE — Telephone Encounter (Signed)
Left message on machine for patient to contact the office regarding procedure time

## 2014-06-03 ENCOUNTER — Emergency Department
Admission: EM | Admit: 2014-06-03 | Discharge: 2014-06-03 | Disposition: A | Discharge status: Home / Self Care | Payer: Medicare Other | Attending: Emergency Medicine | Admitting: Emergency Medicine

## 2014-06-03 ENCOUNTER — Emergency Department: Payer: Medicare Other

## 2014-06-03 DIAGNOSIS — Z79899 Other long term (current) drug therapy: Secondary | ICD-10-CM | POA: Diagnosis not present

## 2014-06-03 DIAGNOSIS — M96831 Postprocedural hemorrhage and hematoma of a musculoskeletal structure following other procedure: Secondary | ICD-10-CM | POA: Diagnosis not present

## 2014-06-03 DIAGNOSIS — E119 Type 2 diabetes mellitus without complications: Secondary | ICD-10-CM | POA: Diagnosis not present

## 2014-06-03 DIAGNOSIS — Z7982 Long term (current) use of aspirin: Secondary | ICD-10-CM | POA: Insufficient documentation

## 2014-06-03 DIAGNOSIS — I10 Essential (primary) hypertension: Secondary | ICD-10-CM | POA: Insufficient documentation

## 2014-06-03 DIAGNOSIS — Y840 Cardiac catheterization as the cause of abnormal reaction of the patient, or of later complication, without mention of misadventure at the time of the procedure: Secondary | ICD-10-CM | POA: Diagnosis not present

## 2014-06-03 DIAGNOSIS — Z7902 Long term (current) use of antithrombotics/antiplatelets: Secondary | ICD-10-CM | POA: Diagnosis not present

## 2014-06-03 DIAGNOSIS — T148XXA Other injury of unspecified body region, initial encounter: Secondary | ICD-10-CM

## 2014-06-03 LAB — PROTIME-INR
INR: 0.99
PROTHROMBIN TIME: 13.3 s (ref 11.4–15.0)

## 2014-06-03 LAB — CBC WITH DIFFERENTIAL/PLATELET
BASOS ABS: 0 10*3/uL (ref 0–0.1)
BASOS PCT: 1 %
EOS PCT: 1 %
Eosinophils Absolute: 0 10*3/uL (ref 0–0.7)
HEMATOCRIT: 27.8 % — AB (ref 35.0–47.0)
Hemoglobin: 9.4 g/dL — ABNORMAL LOW (ref 12.0–16.0)
Lymphocytes Relative: 15 %
Lymphs Abs: 0.9 10*3/uL — ABNORMAL LOW (ref 1.0–3.6)
MCH: 30.4 pg (ref 26.0–34.0)
MCHC: 33.8 g/dL (ref 32.0–36.0)
MCV: 89.9 fL (ref 80.0–100.0)
MONOS PCT: 10 %
Monocytes Absolute: 0.5 10*3/uL (ref 0.2–0.9)
NEUTROS PCT: 73 %
Neutro Abs: 4.2 10*3/uL (ref 1.4–6.5)
Platelets: 129 10*3/uL — ABNORMAL LOW (ref 150–440)
RBC: 3.09 MIL/uL — ABNORMAL LOW (ref 3.80–5.20)
RDW: 15.5 % — AB (ref 11.5–14.5)
WBC: 5.7 10*3/uL (ref 3.6–11.0)

## 2014-06-03 LAB — APTT: APTT: 28 s (ref 24–36)

## 2014-06-03 NOTE — ED Notes (Signed)
Pt reports she had a cardiac cath Wed on right groin and states swelling began last night.

## 2014-06-03 NOTE — Discharge Instructions (Signed)
Please follow up with your cardiologist, have them check on the cath site again on Monday. Please return if the bruising gets much worse or if y ou get light headed or feeling weak.

## 2014-06-03 NOTE — ED Provider Notes (Signed)
[facility] Emergency Department Provider Note  ____________________________________________  Time seen: Approximately 3:03 AM  I have reviewed the triage vital signs and the nursing notes.   HISTORY  Chief Complaint Post-op Problem    HPI [name] is a 81 y.o. female patient reports she had a cardiac catheter on Wednesday she had left the dressing on began having some bruising in pain in the leg yesterday as I understand it called her doctor was told to come here to be evaluated for blood clot patient has some swelling and bruising in the thigh like there is bleeding under the skin from the wound site patient is not lightheaded or woozy is not running a fever Candiss Norse is intact labs were obtained which showed the patient's H&H dropped from 10 to 9:30 to 23 Was Obtained Which Showed No Obvious Active Bleeding or Pseudoaneurysmpatient appears stable at this time we'll discharge her with close follow-up with her doctor  Past Medical History  Diagnosis Date  . Hyperlipidemia   . Hypertension   . Osteoarthritis   . Mild reactive airways disease   . Chronic anxiety   . Esophagitis   . Diverticulosis   . History of colon polyps   . Type 2 diabetes mellitus   . Thyroid cancer     a. s/p right sided hemi-thyroidectomy; b. planning for radioactive iodine tx; c. followed by Dr. Marcelene Butte  . CAD (coronary artery disease)     a. lexiscan 08/2013: nl wall motion, no ischemia, EF 50-55%; b. cardiac cath 05/31/2014: mLAD 90%, dLAD 50%, dLCx 60%, 1st Mrg 80%, pRCA 60% s/p PCI/DES, mRCA 1st lesion 70%, mRCA 2nd 95% s/p PCI/long DES to cover entire mRCA, RPLB 40%, c/b slow flow, CP, STE, pt received lg dose adenosine dRCA with improvement  . History of echocardiogram     a. 08/2013: normal LVSF, nl RVSF, no valvular regurgitation or stenosis   . Carotid artery disease     Patient Active Problem List   Diagnosis Date Noted  . Carotid artery disease   . CAD (coronary  artery disease)   . Hyperlipidemia   . Hypertension   . Angina pectoris 05/31/2014  . SOB (shortness of breath) 04/11/2014  . Palpitations 04/11/2014  . Other fatigue 04/11/2014  . Thyroid cancer 04/11/2014    Past Surgical History  Procedure Laterality Date  . Lumbar laminectomy    . Knee surgery      right knee  . Breast enhancement surgery    . Breast implant removal  06/2001  . Abdominal hysterectomy    . Rotator cuff repair      right  . Cholecystectomy    . Ganglion cyst excision      right AC joint  . Thyroidectomy      Current Outpatient Rx  Name  Route  Sig  Dispense  Refill  . ALPRAZolam (XANAX) 0.25 MG tablet   Oral   Take 0.25 mg by mouth at bedtime as needed.          Marland Kitchen aspirin EC 81 MG tablet   Oral   Take 81 mg by mouth daily.          . Blood Glucose Monitoring Suppl (FIFTY50 GLUCOSE METER 2.0) W/DEVICE KIT      Use as directed (Countour Meter).         . Calcium Carbonate-Vitamin D (CALCIUM-VITAMIN D) 500-200 MG-UNIT per tablet   Oral   Take 2 tablets by mouth 2 (two) times daily.         Marland Kitchen  Cholecalciferol 1000 UNITS tablet   Oral   Take 1,000 Units by mouth daily.          . clopidogrel (PLAVIX) 75 MG tablet   Oral   Take 1 tablet (75 mg total) by mouth daily.   30 tablet   11   . Glucose Blood (BLOOD GLUCOSE TEST STRIPS) STRP      Use once daily. Use as instructed.         Marland Kitchen levothyroxine (SYNTHROID, LEVOTHROID) 50 MCG tablet   Oral   Take 50 mcg by mouth daily before breakfast.       0   . nitroGLYCERIN (NITROSTAT) 0.4 MG SL tablet   Sublingual   Place 1 tablet (0.4 mg total) under the tongue every 5 (five) minutes as needed for chest pain.   25 tablet   6   . ramipril (ALTACE) 2.5 MG capsule   Oral   Take 2.5 mg by mouth daily.          . TOPROL XL 50 MG 24 hr tablet   Oral   Take 50 mg by mouth.            Dispense as written.   . Turmeric 500 MG CAPS   Oral   Take 500 mg by mouth daily.            Allergies Ciprofloxacin; Codeine; Metronidazole; Sulfa antibiotics; Bacitracin-polymyxin b; Celecoxib; Petrolatum-zinc oxide; Statins; and Vioxx  Family History  Problem Relation Age of Onset  . Heart disease Sister     stent placed x 3     Social History History  Substance Use Topics  . Smoking status: Never Smoker   . Smokeless tobacco: Not on file  . Alcohol Use: No    Review of Systems Constitutional: No fever/chills Eyes: No visual changes. ENT: No sore throat. Cardiovascular: Denies chest pain. Respiratory: Denies shortness of breath. Gastrointestinal: No abdominal pain.  No nausea, no vomiting.  No diarrhea.  No constipation. Genitourinary: Negative for dysuria. Musculoskeletal: Negative for back pain. Skin: Negative for rash. Neurological: Negative for headaches, focal weakness or numbness. 10-point ROS otherwise negative.  ____________________________________________   PHYSICAL EXAM:  VITAL SIGNS: ED Triage Vitals  Enc Vitals Group     BP 06/03/14 0018 134/48 mmHg     Pulse Rate 06/03/14 0018 72     Resp 06/03/14 0018 20     Temp 06/03/14 0018 98.5 F (36.9 C)     Temp Source 06/03/14 0018 Oral     SpO2 06/03/14 0018 99 %     Weight 06/03/14 0016 165 lb (74.844 kg)     Height 06/03/14 0016 '5\' 3"'  (1.6 m)     Head Cir --      Peak Flow --      Pain Score 06/03/14 0016 4     Pain Loc --      Pain Edu? --      Excl. in [place]? --     Constitutional: Alert and oriented. Well appearing and in no acute distress. Eyes: Conjunctivae are normal. PERRL. EOMI. Head: Atraumatic. Nose: No congestion/rhinnorhea. Mouth/Throat: Mucous membranes are moist.  Oropharynx non-erythematous. Cardiovascular: Normal rate, regular rhythm. Grossly normal heart sounds.  Good peripheral circulation. Respiratory: Normal respiratory effort.  No retractions. Lungs CTAB. Gastrointestinal: Soft and nontender. No distention. No abdominal bruits. No CVA  tenderness. Musculoskeletal: No lower extremity tenderness nor edema.  No joint effusions. Neurologic:  Normal speech and language. No gross focal neurologic deficits  are appreciated. Speech is normal. No gait instability. Skin:  Skin is warm, dry and intact. No rash noted. Except for bruising in the right upper thigh as noted Psychiatric: Mood and affect are normal. Speech and behavior are normal.  ____________________________________________   LABS (all labs ordered are listed, but only abnormal results are displayed)  Labs Reviewed  CBC WITH DIFFERENTIAL/PLATELET - Abnormal; Notable for the following:    RBC 3.09 (*)    Hemoglobin 9.4 (*)    HCT 27.8 (*)    RDW 15.5 (*)    Platelets 129 (*)    Lymphs Abs 0.9 (*)    All other components within normal limits  PROTIME-INR  APTT   ____________________________________________  EKG   ____________________________________________  RADIOLOGY  Ultrasound shows no acute disease ____________________________________________   PROCEDURES  Procedure(s) performed: None  Critical Care performed: No  ____________________________________________   INITIAL IMPRESSION / ASSESSMENT AND PLAN / ED COURSE  Pertinent labs & imaging results that were available during my care of the patient were reviewed by me and considered in my medical decision making (see chart for details).   ____________________________________________   FINAL CLINICAL IMPRESSION(S) / ED DIAGNOSES  Final diagnoses:  Bruising     Nena Polio, MD 06/03/14 [phone]

## 2014-06-04 ENCOUNTER — Telehealth: Payer: Self-pay

## 2014-06-04 NOTE — Telephone Encounter (Signed)
Left message on machine for patient to contact the office regarding 5/25 procedure time

## 2014-06-04 NOTE — Telephone Encounter (Signed)
S/w pt regarding procedure time 5/25, 1:30. Pt indicated she went to ER last night regarding bruising at incision site from cath. Per pt, test indicated no blood clot and instructed to elevate and ice leg. Pt verbalized understanding with no further questions.

## 2014-06-04 NOTE — Telephone Encounter (Signed)
Patient missed call from [facility] as she was in the shower.  Please call back.  Patient also has an issue with her leg turning black.  Had an ED visit this weekend to r/o blood clot.  Please call back to discuss.

## 2014-06-04 NOTE — Telephone Encounter (Signed)
Left message on machine for patient to contact the office regarding procedure time

## 2014-06-06 ENCOUNTER — Ambulatory Visit (HOSPITAL_COMMUNITY)
Admission: RE | Admit: 2014-06-06 | Discharge: 2014-06-08 | Disposition: A | Discharge status: Home / Self Care | Payer: Medicare Other | Source: Ambulatory Visit | Attending: Cardiovascular Disease | Admitting: Cardiovascular Disease

## 2014-06-06 ENCOUNTER — Encounter (HOSPITAL_COMMUNITY)
Admission: RE | Disposition: A | Discharge status: Home / Self Care | Payer: Medicare Other | Source: Ambulatory Visit | Attending: Cardiovascular Disease

## 2014-06-06 ENCOUNTER — Encounter (HOSPITAL_COMMUNITY): Payer: Self-pay | Admitting: General Practice

## 2014-06-06 DIAGNOSIS — I2584 Coronary atherosclerosis due to calcified coronary lesion: Secondary | ICD-10-CM | POA: Insufficient documentation

## 2014-06-06 DIAGNOSIS — Z7982 Long term (current) use of aspirin: Secondary | ICD-10-CM | POA: Diagnosis not present

## 2014-06-06 DIAGNOSIS — E039 Hypothyroidism, unspecified: Secondary | ICD-10-CM | POA: Diagnosis not present

## 2014-06-06 DIAGNOSIS — R4701 Aphasia: Secondary | ICD-10-CM

## 2014-06-06 DIAGNOSIS — K219 Gastro-esophageal reflux disease without esophagitis: Secondary | ICD-10-CM | POA: Insufficient documentation

## 2014-06-06 DIAGNOSIS — R4789 Other speech disturbances: Secondary | ICD-10-CM | POA: Diagnosis not present

## 2014-06-06 DIAGNOSIS — I10 Essential (primary) hypertension: Secondary | ICD-10-CM | POA: Diagnosis not present

## 2014-06-06 DIAGNOSIS — I200 Unstable angina: Secondary | ICD-10-CM | POA: Diagnosis present

## 2014-06-06 DIAGNOSIS — D649 Anemia, unspecified: Secondary | ICD-10-CM | POA: Insufficient documentation

## 2014-06-06 DIAGNOSIS — Z86010 Personal history of colonic polyps: Secondary | ICD-10-CM | POA: Insufficient documentation

## 2014-06-06 DIAGNOSIS — I25110 Atherosclerotic heart disease of native coronary artery with unstable angina pectoris: Secondary | ICD-10-CM | POA: Diagnosis not present

## 2014-06-06 DIAGNOSIS — I63231 Cerebral infarction due to unspecified occlusion or stenosis of right carotid arteries: Secondary | ICD-10-CM | POA: Diagnosis not present

## 2014-06-06 DIAGNOSIS — E119 Type 2 diabetes mellitus without complications: Secondary | ICD-10-CM | POA: Diagnosis not present

## 2014-06-06 DIAGNOSIS — I25118 Atherosclerotic heart disease of native coronary artery with other forms of angina pectoris: Secondary | ICD-10-CM | POA: Diagnosis present

## 2014-06-06 DIAGNOSIS — Z885 Allergy status to narcotic agent status: Secondary | ICD-10-CM | POA: Diagnosis not present

## 2014-06-06 DIAGNOSIS — M4802 Spinal stenosis, cervical region: Secondary | ICD-10-CM | POA: Diagnosis not present

## 2014-06-06 DIAGNOSIS — J45909 Unspecified asthma, uncomplicated: Secondary | ICD-10-CM | POA: Diagnosis not present

## 2014-06-06 DIAGNOSIS — Z85850 Personal history of malignant neoplasm of thyroid: Secondary | ICD-10-CM | POA: Insufficient documentation

## 2014-06-06 DIAGNOSIS — I639 Cerebral infarction, unspecified: Secondary | ICD-10-CM | POA: Insufficient documentation

## 2014-06-06 DIAGNOSIS — Z955 Presence of coronary angioplasty implant and graft: Secondary | ICD-10-CM | POA: Insufficient documentation

## 2014-06-06 DIAGNOSIS — E785 Hyperlipidemia, unspecified: Secondary | ICD-10-CM | POA: Insufficient documentation

## 2014-06-06 DIAGNOSIS — I672 Cerebral atherosclerosis: Secondary | ICD-10-CM | POA: Insufficient documentation

## 2014-06-06 DIAGNOSIS — Z882 Allergy status to sulfonamides status: Secondary | ICD-10-CM | POA: Diagnosis not present

## 2014-06-06 DIAGNOSIS — M069 Rheumatoid arthritis, unspecified: Secondary | ICD-10-CM | POA: Diagnosis not present

## 2014-06-06 DIAGNOSIS — Z881 Allergy status to other antibiotic agents status: Secondary | ICD-10-CM | POA: Diagnosis not present

## 2014-06-06 DIAGNOSIS — I63232 Cerebral infarction due to unspecified occlusion or stenosis of left carotid arteries: Secondary | ICD-10-CM | POA: Diagnosis not present

## 2014-06-06 DIAGNOSIS — Z888 Allergy status to other drugs, medicaments and biological substances status: Secondary | ICD-10-CM | POA: Diagnosis not present

## 2014-06-06 HISTORY — DX: Rheumatoid arthritis, unspecified: M06.9

## 2014-06-06 HISTORY — PX: CARDIAC CATHETERIZATION: SHX172

## 2014-06-06 HISTORY — DX: Cerebral infarction, unspecified: I63.9

## 2014-06-06 HISTORY — DX: Other specified postprocedural states: Z98.890

## 2014-06-06 HISTORY — DX: Hypothyroidism, unspecified: E03.9

## 2014-06-06 HISTORY — DX: Nausea with vomiting, unspecified: R11.2

## 2014-06-06 HISTORY — DX: Gastro-esophageal reflux disease without esophagitis: K21.9

## 2014-06-06 LAB — CBC
HEMATOCRIT: 28.4 % — AB (ref 36.0–46.0)
Hemoglobin: 9.2 g/dL — ABNORMAL LOW (ref 12.0–15.0)
MCH: 29.3 pg (ref 26.0–34.0)
MCHC: 32.4 g/dL (ref 30.0–36.0)
MCV: 90.4 fL (ref 78.0–100.0)
Platelets: 171 10*3/uL (ref 150–400)
RBC: 3.14 MIL/uL — ABNORMAL LOW (ref 3.87–5.11)
RDW: 15.1 % (ref 11.5–15.5)
WBC: 5.2 10*3/uL (ref 4.0–10.5)

## 2014-06-06 LAB — GLUCOSE, CAPILLARY
GLUCOSE-CAPILLARY: 113 mg/dL — AB (ref 65–99)
GLUCOSE-CAPILLARY: 131 mg/dL — AB (ref 65–99)
Glucose-Capillary: 183 mg/dL — ABNORMAL HIGH (ref 65–99)

## 2014-06-06 LAB — POCT ACTIVATED CLOTTING TIME
ACTIVATED CLOTTING TIME: 245 s
Activated Clotting Time: 233 seconds
Activated Clotting Time: 276 seconds
Activated Clotting Time: 343 seconds

## 2014-06-06 SURGERY — CORONARY ATHERECTOMY
Anesthesia: LOCAL

## 2014-06-06 MED ORDER — ATORVASTATIN CALCIUM 40 MG PO TABS
40.0000 mg | ORAL_TABLET | Freq: Every day | ORAL | Status: DC
  Administered 2014-06-06 – 2014-06-07 (×2): 40 mg via ORAL
  Filled 2014-06-06 (×2): qty 1

## 2014-06-06 MED ORDER — SODIUM CHLORIDE 0.9 % IV SOLN: 250.0000 mL | INTRAVENOUS | Status: DC | PRN

## 2014-06-06 MED ORDER — SODIUM CHLORIDE 0.9 % WEIGHT BASED INFUSION: 1.0000 mL/kg/h | INTRAVENOUS | Status: DC

## 2014-06-06 MED ORDER — SODIUM CHLORIDE 0.9 % IJ SOLN
3.0000 mL | Freq: Two times a day (BID) | INTRAMUSCULAR | Status: DC
  Administered 2014-06-07 (×2): 3 mL via INTRAVENOUS

## 2014-06-06 MED ORDER — CLOPIDOGREL BISULFATE 75 MG PO TABS
75.0000 mg | ORAL_TABLET | Freq: Every day | ORAL | Status: DC
  Administered 2014-06-07 – 2014-06-08 (×2): 75 mg via ORAL
  Filled 2014-06-06 (×2): qty 1

## 2014-06-06 MED ORDER — VERAPAMIL HCL 2.5 MG/ML IV SOLN
INTRAVENOUS | Status: DC | PRN
  Administered 2014-06-06: 14:00:00 via INTRA_ARTERIAL

## 2014-06-06 MED ORDER — LIDOCAINE HCL (PF) 1 % IJ SOLN
INTRAMUSCULAR | Status: AC
  Filled 2014-06-06: qty 30

## 2014-06-06 MED ORDER — CLOPIDOGREL BISULFATE 300 MG PO TABS
ORAL_TABLET | ORAL | Status: AC
  Filled 2014-06-06: qty 1

## 2014-06-06 MED ORDER — ONDANSETRON HCL 4 MG/2ML IJ SOLN
INTRAMUSCULAR | Status: DC | PRN
  Administered 2014-06-06: 4 mg via INTRAVENOUS

## 2014-06-06 MED ORDER — SODIUM CHLORIDE 0.9 % IJ SOLN: 3.0000 mL | INTRAMUSCULAR | Status: DC | PRN

## 2014-06-06 MED ORDER — ACETAMINOPHEN 325 MG PO TABS: 650.0000 mg | ORAL_TABLET | ORAL | Status: DC | PRN

## 2014-06-06 MED ORDER — RAMIPRIL 10 MG PO CAPS
10.0000 mg | ORAL_CAPSULE | Freq: Every day | ORAL | Status: DC
  Administered 2014-06-07 – 2014-06-08 (×2): 10 mg via ORAL
  Filled 2014-06-06 (×2): qty 1

## 2014-06-06 MED ORDER — VERAPAMIL HCL 2.5 MG/ML IV SOLN
INTRAVENOUS | Status: AC
  Filled 2014-06-06: qty 2

## 2014-06-06 MED ORDER — SODIUM CHLORIDE 0.9 % IV SOLN: INTRAVENOUS | Status: AC

## 2014-06-06 MED ORDER — MIDAZOLAM HCL 2 MG/2ML IJ SOLN
INTRAMUSCULAR | Status: DC | PRN
  Administered 2014-06-06 (×2): 1 mg via INTRAVENOUS

## 2014-06-06 MED ORDER — HEPARIN (PORCINE) IN NACL 2-0.9 UNIT/ML-% IJ SOLN
INTRAMUSCULAR | Status: AC
  Filled 2014-06-06: qty 1500

## 2014-06-06 MED ORDER — FENTANYL CITRATE (PF) 100 MCG/2ML IJ SOLN
INTRAMUSCULAR | Status: DC | PRN
  Administered 2014-06-06: 25 ug via INTRAVENOUS
  Administered 2014-06-06 (×2): 50 ug via INTRAVENOUS
  Administered 2014-06-06: 25 ug via INTRAVENOUS

## 2014-06-06 MED ORDER — ASPIRIN EC 81 MG PO TBEC
81.0000 mg | DELAYED_RELEASE_TABLET | Freq: Every day | ORAL | Status: DC
  Administered 2014-06-07 – 2014-06-08 (×2): 81 mg via ORAL
  Filled 2014-06-06 (×2): qty 1

## 2014-06-06 MED ORDER — ALPRAZOLAM 0.25 MG PO TABS
0.2500 mg | ORAL_TABLET | Freq: Every evening | ORAL | Status: DC | PRN
  Administered 2014-06-08: 0.25 mg via ORAL
  Filled 2014-06-06: qty 1

## 2014-06-06 MED ORDER — HEPARIN SODIUM (PORCINE) 1000 UNIT/ML IJ SOLN
INTRAMUSCULAR | Status: DC | PRN
  Administered 2014-06-06: 7000 [IU] via INTRAVENOUS
  Administered 2014-06-06: 3000 [IU] via INTRAVENOUS

## 2014-06-06 MED ORDER — MIDAZOLAM HCL 2 MG/2ML IJ SOLN
INTRAMUSCULAR | Status: AC
  Filled 2014-06-06: qty 2

## 2014-06-06 MED ORDER — NITROGLYCERIN 1 MG/10 ML FOR IR/CATH LAB
INTRA_ARTERIAL | Status: DC | PRN
  Administered 2014-06-06: 200 ug via INTRA_ARTERIAL
  Administered 2014-06-06: 100 ug via INTRA_ARTERIAL
  Administered 2014-06-06 (×4): 200 ug via INTRA_ARTERIAL

## 2014-06-06 MED ORDER — ONDANSETRON HCL 4 MG/2ML IJ SOLN
INTRAMUSCULAR | Status: AC
  Filled 2014-06-06: qty 2

## 2014-06-06 MED ORDER — NITROGLYCERIN 1 MG/10 ML FOR IR/CATH LAB
INTRA_ARTERIAL | Status: AC
  Filled 2014-06-06: qty 10

## 2014-06-06 MED ORDER — HEPARIN SODIUM (PORCINE) 1000 UNIT/ML IJ SOLN
INTRAMUSCULAR | Status: AC
  Filled 2014-06-06: qty 1

## 2014-06-06 MED ORDER — FENTANYL CITRATE (PF) 100 MCG/2ML IJ SOLN
INTRAMUSCULAR | Status: AC
  Filled 2014-06-06: qty 2

## 2014-06-06 MED ORDER — LEVOTHYROXINE SODIUM 50 MCG PO TABS
50.0000 ug | ORAL_TABLET | Freq: Every day | ORAL | Status: DC
  Administered 2014-06-07 – 2014-06-08 (×2): 50 ug via ORAL
  Filled 2014-06-06 (×2): qty 1

## 2014-06-06 MED ORDER — METOPROLOL SUCCINATE ER 50 MG PO TB24
50.0000 mg | ORAL_TABLET | Freq: Every day | ORAL | Status: DC
  Administered 2014-06-07 – 2014-06-08 (×2): 50 mg via ORAL
  Filled 2014-06-06 (×2): qty 1

## 2014-06-06 MED ORDER — ADENOSINE 6 MG/2ML IV SOLN
INTRAVENOUS | Status: AC
  Filled 2014-06-06: qty 2

## 2014-06-06 MED ORDER — PANTOPRAZOLE SODIUM 40 MG PO TBEC
40.0000 mg | DELAYED_RELEASE_TABLET | Freq: Every day | ORAL | Status: DC
  Administered 2014-06-07 – 2014-06-08 (×2): 40 mg via ORAL
  Filled 2014-06-06 (×2): qty 1

## 2014-06-06 MED ORDER — ASPIRIN 81 MG PO CHEW
81.0000 mg | CHEWABLE_TABLET | ORAL | Status: AC
  Administered 2014-06-06: 81 mg via ORAL

## 2014-06-06 MED ORDER — SODIUM CHLORIDE 0.9 % WEIGHT BASED INFUSION
3.0000 mL/kg/h | INTRAVENOUS | Status: DC
  Administered 2014-06-06: 3 mL/kg/h via INTRAVENOUS

## 2014-06-06 MED ORDER — IOHEXOL 350 MG/ML SOLN
INTRAVENOUS | Status: DC | PRN
  Administered 2014-06-06: 210 mL via INTRA_ARTERIAL

## 2014-06-06 MED ORDER — SODIUM CHLORIDE 0.9 % IJ SOLN: 3.0000 mL | Freq: Two times a day (BID) | INTRAMUSCULAR | Status: DC

## 2014-06-06 MED ORDER — NITROGLYCERIN 0.4 MG SL SUBL
0.4000 mg | SUBLINGUAL_TABLET | SUBLINGUAL | Status: DC | PRN
  Administered 2014-06-06: 0.4 mg via SUBLINGUAL
  Filled 2014-06-06: qty 1

## 2014-06-06 MED ORDER — CLOPIDOGREL BISULFATE 75 MG PO TABS: 75.0000 mg | ORAL_TABLET | ORAL | Status: DC

## 2014-06-06 MED ORDER — ONDANSETRON HCL 4 MG/2ML IJ SOLN: 4.0000 mg | Freq: Four times a day (QID) | INTRAMUSCULAR | Status: DC | PRN

## 2014-06-06 MED ORDER — ASPIRIN 81 MG PO CHEW
CHEWABLE_TABLET | ORAL | Status: AC
  Administered 2014-06-06: 81 mg via ORAL
  Filled 2014-06-06: qty 1

## 2014-06-06 MED ORDER — CLOPIDOGREL BISULFATE 300 MG PO TABS
ORAL_TABLET | ORAL | Status: DC | PRN
  Administered 2014-06-06: 300 mg via ORAL

## 2014-06-06 SURGICAL SUPPLY — 27 items
BALLN MINITREK OTW 1.20X12 (BALLOONS) ×2
BALLN MINITREK OTW 1.20X6 (BALLOONS) ×2
BALLN MINITREK RX 2.0X12 (BALLOONS) ×2
BALLN ~~LOC~~ TREK RX 2.75X12 (BALLOONS) ×2
BALLOON MINITREK OTW 1.20X12 (BALLOONS) ×1 IMPLANT
BALLOON MINITREK OTW 1.20X6 (BALLOONS) ×1 IMPLANT
BALLOON MINITREK RX 2.0X12 (BALLOONS) ×1 IMPLANT
BALLOON ~~LOC~~ TREK RX 2.75X12 (BALLOONS) ×1 IMPLANT
CATH MICROGUIDE FINCRSS 150 CM (MICROCATHETER) ×1 IMPLANT
CATH VISTA GUIDE 6FR XBLAD3.5 (CATHETERS) ×2 IMPLANT
CROWN DIAMONDBACK CLASSIC 1.25 (BURR) ×2 IMPLANT
DEVICE RAD COMP TR BAND LRG (VASCULAR PRODUCTS) ×4 IMPLANT
DEVICE TORQUE .014-.018 (MISCELLANEOUS) ×1 IMPLANT
GLIDESHEATH SLEND SS 6F .025 (SHEATH) ×2 IMPLANT
KIT ENCORE 26 ADVANTAGE (KITS) ×2 IMPLANT
KIT HEART LEFT (KITS) ×2 IMPLANT
LUBRICANT VIPERSLIDE CORONARY (MISCELLANEOUS) ×2 IMPLANT
MICROGUIDE FINECROSS 150 CM (MICROCATHETER) ×2
PACK CARDIAC CATHETERIZATION (CUSTOM PROCEDURE TRAY) ×2 IMPLANT
STENT XIENCE ALPINE RX 2.5X15 (Permanent Stent) ×2 IMPLANT
TORQUE DEVICE .014-.018 (MISCELLANEOUS) ×2
TRANSDUCER W/STOPCOCK (MISCELLANEOUS) ×2 IMPLANT
TUBING CIL FLEX 10 FLL-RA (TUBING) ×2 IMPLANT
WIRE HI TORQ VERSACORE-J 145CM (WIRE) ×2 IMPLANT
WIRE RUNTHROUGH .014X300CM (WIRE) ×2 IMPLANT
WIRE SAFE-T 1.5MM-J .035X260CM (WIRE) ×2 IMPLANT
WIRE VIPER ADVANCE COR .012TIP (WIRE) ×4 IMPLANT

## 2014-06-06 NOTE — Interval H&P Note (Signed)
History and Physical Interval Note:  06/06/2014 2:13 PM  [name]  has presented today for surgery, with the diagnosis of cad  The various methods of treatment have been discussed with the patient and family. After consideration of risks, benefits and other options for treatment, the patient has consented to  Procedure(s): Coronary/Graft Atherectomy (N/A) as a surgical intervention .  The patient's history has been reviewed, patient examined, no change in status, stable for surgery.  I have reviewed the patient's chart and labs.  Questions were answered to the patient's satisfaction.     [name]

## 2014-06-06 NOTE — H&P (View-Only) (Signed)
SUBJECTIVE: No chest pain, shortness of breath or nausea since last night.    Filed Vitals:   06/01/14 0500 06/01/14 0600 06/01/14 0700 06/01/14 0800  BP: 121/54 128/52 144/56 121/63  Pulse: 72 69 80 75  Temp: 98.3 F (36.8 C)   98 F (36.7 C)  TempSrc: Oral   Axillary  Resp: 15 14 17 15   Height:      Weight:      SpO2: 98% 98% 96% 98%    Intake/Output Summary (Last 24 hours) at 06/01/14 0823 Last data filed at 06/01/14 0500  Gross per 24 hour  Intake  829.7 ml  Output    650 ml  Net  179.7 ml    LABS: Basic Metabolic Panel:  Recent Labs  06/01/14 0653  NA 141  K 4.4  CL 110  CO2 25  GLUCOSE 127*  BUN 23*  CREATININE 1.10*  CALCIUM 8.5*   Liver Function Tests: No results for input(s): AST, ALT, ALKPHOS, BILITOT, PROT, ALBUMIN in the last 72 hours. No results for input(s): LIPASE, AMYLASE in the last 72 hours. CBC:  Recent Labs  05/31/14 2123 06/01/14 0653  WBC  --  7.3  HGB  --  10.2*  HCT  --  30.9*  MCV  --  90.1  PLT 147* 159   Cardiac Enzymes:  Recent Labs  06/01/14 0653  CKTOTAL 162  CKMB 13.8*  TROPONINI 0.94*   BNP: Invalid input(s): POCBNP D-Dimer: No results for input(s): DDIMER in the last 72 hours. Hemoglobin A1C: No results for input(s): HGBA1C in the last 72 hours. Fasting Lipid Panel: No results for input(s): CHOL, HDL, LDLCALC, TRIG, CHOLHDL, LDLDIRECT in the last 72 hours. Thyroid Function Tests: No results for input(s): TSH, T4TOTAL, T3FREE, THYROIDAB in the last 72 hours.  Invalid input(s): FREET3 Anemia Panel: No results for input(s): VITAMINB12, FOLATE, FERRITIN, TIBC, IRON, RETICCTPCT in the last 72 hours.   PHYSICAL EXAM General: Well developed, well nourished, in no acute distress HEENT:  Normocephalic and atramatic Neck:  No JVD.  Lungs: Clear bilaterally to auscultation and percussion. Heart: HRRR . Normal S1 and S2 without gallops or murmurs.  Abdomen: Bowel sounds are positive, abdomen soft and  non-tender  Msk:  Back normal, normal gait. Normal strength and tone for age. Extremities: No clubbing, cyanosis or edema.   Neuro: Alert and oriented X 3. Psych:  Good affect, responds appropriately No groin hematoma.   TELEMETRY: Reviewed telemetry pt in NSR  ASSESSMENT AND PLAN:  1. CAD with class 3 angina: Cardiac cath showed severe 2 vessel CAD.  RCA PCI was performed. A long DES was placed in the mid segment. There was hazy calcified area proximal to the stent with suspected edge dissection. Thus, I decided to place a short overlapping stent. After deployment, there was slow flow noted with chest pain and inferior STE noted on ECG. I placed an over the wire balloon in the distal RCA and gave large doses of Adenosine with gradual improvement. There was TIMI 3 flow at the end.   ECG is back to normal. Surprisingly, the cardiac enzymes are not as high as I though they would be. Thus, she did not have a post procedure MI.  Ambulate today with PT and if stable, discharge home on current medications.   The LAD lesion is very calcified and at the takeoff of the diagonal branch. Recommendation to perform debulking/rotoblade and PCI at [facility] next week with me on Wednesday. I extensively discussed risks  and benefits.    2. Hypertension: BP is controlled.   3. Hyperlipidemia: continue Atorvastatin.   Kathlyn Sacramento, MD, [facility] 06/01/2014 8:23 AM

## 2014-06-07 ENCOUNTER — Encounter (HOSPITAL_COMMUNITY): Payer: Self-pay | Admitting: Cardiovascular Disease

## 2014-06-07 ENCOUNTER — Ambulatory Visit (HOSPITAL_COMMUNITY): Payer: Medicare Other

## 2014-06-07 DIAGNOSIS — I63231 Cerebral infarction due to unspecified occlusion or stenosis of right carotid arteries: Secondary | ICD-10-CM | POA: Diagnosis not present

## 2014-06-07 DIAGNOSIS — D649 Anemia, unspecified: Secondary | ICD-10-CM

## 2014-06-07 DIAGNOSIS — I2584 Coronary atherosclerosis due to calcified coronary lesion: Secondary | ICD-10-CM | POA: Diagnosis not present

## 2014-06-07 DIAGNOSIS — I25110 Atherosclerotic heart disease of native coronary artery with unstable angina pectoris: Secondary | ICD-10-CM | POA: Diagnosis not present

## 2014-06-07 DIAGNOSIS — I63232 Cerebral infarction due to unspecified occlusion or stenosis of left carotid arteries: Secondary | ICD-10-CM | POA: Diagnosis not present

## 2014-06-07 DIAGNOSIS — D62 Acute posthemorrhagic anemia: Secondary | ICD-10-CM

## 2014-06-07 DIAGNOSIS — I2510 Atherosclerotic heart disease of native coronary artery without angina pectoris: Secondary | ICD-10-CM

## 2014-06-07 DIAGNOSIS — R4789 Other speech disturbances: Secondary | ICD-10-CM | POA: Diagnosis not present

## 2014-06-07 LAB — GLUCOSE, CAPILLARY
GLUCOSE-CAPILLARY: 102 mg/dL — AB (ref 65–99)
GLUCOSE-CAPILLARY: 131 mg/dL — AB (ref 65–99)
GLUCOSE-CAPILLARY: 147 mg/dL — AB (ref 65–99)

## 2014-06-07 LAB — PLATELET INHIBITION P2Y12: PLATELET FUNCTION P2Y12: 52 [PRU] — AB (ref 194–418)

## 2014-06-07 LAB — BASIC METABOLIC PANEL
Anion gap: 7 (ref 5–15)
BUN: 14 mg/dL (ref 6–20)
CALCIUM: 8.5 mg/dL — AB (ref 8.9–10.3)
CHLORIDE: 111 mmol/L (ref 101–111)
CO2: 23 mmol/L (ref 22–32)
Creatinine, Ser: 0.91 mg/dL (ref 0.44–1.00)
GFR calc Af Amer: >60 mL/min (ref >60)
GFR calc non Af Amer: 59 mL/min — ABNORMAL LOW (ref >60)
Glucose, Bld: 99 mg/dL (ref 65–99)
POTASSIUM: 4 mmol/L (ref 3.5–5.1)
Sodium: 141 mmol/L (ref 135–145)

## 2014-06-07 LAB — CBC
HEMATOCRIT: 24.7 % — AB (ref 36.0–46.0)
HEMOGLOBIN: 8 g/dL — AB (ref 12.0–15.0)
MCH: 29.5 pg (ref 26.0–34.0)
MCHC: 32.4 g/dL (ref 30.0–36.0)
MCV: 91.1 fL (ref 78.0–100.0)
PLATELETS: 163 10*3/uL (ref 150–400)
RBC: 2.71 MIL/uL — AB (ref 3.87–5.11)
RDW: 15.3 % (ref 11.5–15.5)
WBC: 4.7 10*3/uL (ref 4.0–10.5)

## 2014-06-07 MED ORDER — FERROUS SULFATE 325 (65 FE) MG PO TABS
325.0000 mg | ORAL_TABLET | Freq: Two times a day (BID) | ORAL | Status: DC
  Administered 2014-06-07 – 2014-06-08 (×3): 325 mg via ORAL
  Filled 2014-06-07 (×3): qty 1

## 2014-06-07 NOTE — Progress Notes (Addendum)
Patient mentioned that several weeks ago while standing in line waiting to order food she had a sudden episode of garbled speech. She could not verbalize her order - speech would not come out and when it did it was nonsensical. No facial drooping, focal weakness, visual changes. Lasted for several minutes, then resolved spontaneously. Symptoms concerning for TIA. She has not had any recurrence since. Neuro exam nonfocal today. As this has implications with regard to future use of DAPT will obtain neuro imaging. (She is not currently on any Effient or Brilinta - she is on Plavix.) Curbsided neuro - recommended MRI/MRA of brain w/o contrast, carotid doppler and echo. I discussed recent stenting with Dr. Burt Knack who was on the floor and he agreed she should be ok for MRI with recent stents. Guenther Dunshee PA-C

## 2014-06-07 NOTE — Progress Notes (Signed)
CARDIAC REHAB PHASE I   PRE:  Rate/Rhythm: 75 SR    BP: sitting 145/30    SaO2:   MODE:  Ambulation: 320 ft   POST:  Rate/Rhythm: 93 SR    BP: sitting 142/70     SaO2:   Tolerated well using RW, slow pace but not assist needed. Denied CP. To recliner, ed completed. Interested in [facility] and will send referral to [place]. [place], [place], ACSM 06/07/2014 9:50 AM

## 2014-06-07 NOTE — Progress Notes (Signed)
Patient: [name] / Admit Date: 06/06/2014 / Date of Encounter: 06/07/2014, 7:31 AM   Subjective: Feeling well. No CP or SOB. Has not yet been out of bed with cardiac rehab.   Objective: Telemetry: NSR Physical Exam: Blood pressure 145/52, pulse 70, temperature 98 F (36.7 C), temperature source Oral, resp. rate 20, height 5\' 3"  (1.6 m), weight 166 lb 14.2 oz (75.7 kg), SpO2 95 %. General: Well developed, well nourished WF, in no acute distress. Head: Normocephalic, atraumatic, sclera non-icteric, no xanthomas, nares are without discharge. Neck: JVP not elevated. Lungs: Clear bilaterally to auscultation without wheezes, rales, or rhonchi. Breathing is unlabored. Heart: RRR S1 S2 without murmurs, rubs, or gallops.  Abdomen: Soft, non-tender, non-distended with normoactive bowel sounds. No rebound/guarding. Extremities: No clubbing or cyanosis. No edema. Distal pedal pulses are 2+ and equal bilaterally. Right wrist cath site without ecchymosis or hematoma. Right groin site without bruit but she has extensive ecchymosis down right groin, inner thigh, and down into calf. The upper area of her ecchymosis appears to be yellowing suggestive of resolving ecchymosis. Neuro: Alert and oriented X 3. Moves all extremities spontaneously. Psych:  Responds to questions appropriately with a normal affect.   Intake/Output Summary (Last 24 hours) at 06/07/14 0731 Last data filed at 06/07/14 0344  Gross per 24 hour  Intake      0 ml  Output   1100 ml  Net  -1100 ml    Inpatient Medications:  . aspirin EC  81 mg Oral Daily  . atorvastatin  40 mg Oral q1800  . clopidogrel  75 mg Oral Daily  . levothyroxine  50 mcg Oral QAC breakfast  . metoprolol succinate  50 mg Oral Daily  . pantoprazole  40 mg Oral Daily  . ramipril  10 mg Oral Daily  . sodium chloride  3 mL Intravenous Q12H   Infusions:    Labs:  Recent Labs  06/07/14 0326  NA 141  K 4.0  CL 111  CO2 23  GLUCOSE 99  BUN 14   CREATININE 0.91  CALCIUM 8.5*   No results for input(s): AST, ALT, ALKPHOS, BILITOT, PROT, ALBUMIN in the last 72 hours.  Recent Labs  06/06/14 1230 06/07/14 0326  WBC 5.2 4.7  HGB 9.2* 8.0*  HCT 28.4* 24.7*  MCV 90.4 91.1  PLT 171 163   No results for input(s): CKTOTAL, CKMB, TROPONINI in the last 72 hours. Invalid input(s): POCBNP No results for input(s): HGBA1C in the last 72 hours.   Radiology/Studies:  Dg Chest 2 View  05/23/2014   CLINICAL DATA:  Cardiac catheterization 05/27/2014. Family history of heart disease.  EXAM: CHEST  2 VIEW  COMPARISON:  None.  FINDINGS: There is no focal parenchymal opacity. There is no pleural effusion or pneumothorax. The heart and mediastinal contours are unremarkable.  Partially visualized is posterior lumbar fusion hardware. Osteoarthritis of bilateral shoulders. Prior left rotator cuff repair.  IMPRESSION: No active cardiopulmonary disease.   Electronically Signed   By: Kathreen Devoid   On: 05/23/2014 15:41   Nm Rai Therapy Cancer Thyroid  05/17/2014   CLINICAL DATA:  Papillary thyroid carcinoma. T3 disease with extrathyroidal extension. Patient presents for remnant ablation and adjuvant therapy. 81 year old female.  EXAM: RADIOACTIVE IODINE THERAPY FOR THYROID CANCER  PROCEDURE: The risks and benefits of radioactive iodine therapy were discussed with the patient in detail. Alternative therapies were also mentioned. Radiation safety was discussed with the patient, including how to protect the general public from  exposure. There were no barriers to communication. Written consent was obtained. The patient then received a capsule containing the radiopharmaceutical. Dr Kathlene Cote administered dose. Dose calculated by Dr. Candise Che.  The patient will follow-up with the referring physician.  RADIOPHARMACEUTICALS:  10.6 mCi I-131 sodium iodide  FINDINGS: 106 mCi I 131 administered on 05/08/2014 for remnant ablation and adjuvant therapy for T3 papillary thyroid  carcinoma.  IMPRESSION: Per oral administration of I-131 sodium iodide for thyroid remnant ablation / thyroid cancer adjuvant therapy.   Electronically Signed   By: Suzy Bouchard M.D.   On: 05/17/2014 22:03   Nm Whole Body I131 Scan S/p Ca Rx  05/17/2014   CLINICAL DATA:  Papillary thyroid carcinoma. T3 N0 disease. Extrathyroidal extension present. Patient received 100 mCi I 131 for adjuvant therapy on 4 07/02/2014  EXAM: NUCLEAR MEDICINE I-131 POST THERAPY WHOLE BODY SCAN  TECHNIQUE: The patient received 100 mCi I-131 sodium iodide for the treatment of thyroid cancer within the past 10 days. The patient returns today, and whole body scanning was performed in the anterior and posterior projections.  COMPARISON:  None.  FINDINGS: Intense uptake within thyroid bed consistent with remnant thyroid tissue. Cannot exclude residual thyroid carcinoma. The no evidence of abnormal uptake on the whole-body scan to suggest distant metastasis.  IMPRESSION: 1. No evidence of distant thyroid cancer metastasis. 2. Intense activity in thyroid bed represent remnant thyroid tissue. Cannot exclude residual thyroid carcinoma.   Electronically Signed   By: Suzy Bouchard M.D.   On: 05/17/2014 21:57   [place] Extrem Low Right Ltd  06/03/2014   CLINICAL DATA:  Swelling and discoloration at the right groin and thigh, after cardiac catheterization. Initial encounter.  EXAM: ULTRASOUND RIGHT LOWER EXTREMITY LIMITED  TECHNIQUE: Ultrasound examination of the lower extremity soft tissues was performed in the area of clinical concern.  COMPARISON:  None.  FINDINGS: There is a small 2.0 x 1.0 x 1.3 cm hypoechoic focus noted anterior and directly adjacent to the common femoral vessels just above the level of the femoral bifurcation. No associated blood flow is seen, suggesting against a lymph node. This could reflect a small hematoma or postoperative seroma. A thrombosed pseudoaneurysm is considered less likely.  The remaining visualized soft  tissues are grossly unremarkable, aside from mild diffuse soft tissue edema.  IMPRESSION: Small 2.0 x 1.0 x 1.3 cm hypoechoic focus anterior directly adjacent to the common femoral vessels just above the level the bifurcation. No associated blood flow seen. This could reflect a small postoperative hematoma or seroma. A thrombosed pseudoaneurysm is considered less likely. No evidence of a patent pseudoaneurysm. Mild overlying soft tissue edema noted.   Electronically Signed   By: Garald Balding M.D.   On: 06/03/2014 02:42     Assessment and Plan   1. CAD  - outpatient cath 05/31/14 with mulitvessel disease, s/p DES to RCA, complicated by inferior ST elevation post procedure at [facility]. (LVEF 55-65% by cath 05/31/14.) S/p staged orbital atherectomy and DES to prox LAD 06/06/14. Continue current therapy.  2. Anemia - steady downtrend since 5/10 - 12.3->10->9->8.0. Suspect due to large post cath ecchymosis from 05/31/14 femoral stick, subsequent procedure and IV fluids surrounding case. She said this has not worsened since her groin [place] 5/22 with small hematoma; thrombosed pseudoaneurysm less likely, no evidence of patent pseudoaneurysm. Hemoccult ordered to cover all bases. Will start empiric iron to replete stores.  3. HTN - ramipril increased by Dr. Fletcher Anon from 2.5mg  daily to start 10mg  daily today.  This should help pressures.  4. HLD - h/o myalgias on statin but Dr. Fletcher Anon has her on atorvastatin. Will see how she tolerates. Draw lipids in AM.  5. DM2 - Metformin has remained on hold since last cath. We can resume 48 hours post-cath if doing well.  6. History of 1.2 cm complex right lobe thyroid nodule s/p right sided hemithyroidectomy 2015 - awaiting RAI thyroid treatment.  Signed, Melina Copa PA-C Pager: [phone]    Patient seen and examined. Agree with assessment and plan. Pt feels well without recurrent chest pain.  Large area of ecchymosis R thigh from procedure last week. Duplex reviewed. HB  decreased from yesterday's procedure.  Will initiate empiric iron. Observe today. Repeat labs in am and anticipate dc tomorrow.   Troy Sine, MD, [facility] 06/07/2014 8:10 AM

## 2014-06-07 NOTE — Progress Notes (Signed)
TR BAND REMOVAL   LOCATION:    right radial  DEFLATED PER PROTOCOL:    Yes.    TIME BAND OFF / DRESSING APPLIED:    21:00   SITE UPON ARRIVAL:    Level 1  SITE AFTER BAND REMOVAL:    Level 1  REVERSE ALLEN'S TEST:      CIRCULATION SENSATION AND MOVEMENT:    Within Normal Limits   Yes.    COMMENTS:   Pt tolerated removal of TR band, radial site with soft hematoma, will continue to monitor patient,

## 2014-06-08 ENCOUNTER — Ambulatory Visit (HOSPITAL_COMMUNITY): Payer: Medicare Other

## 2014-06-08 ENCOUNTER — Encounter (HOSPITAL_COMMUNITY): Payer: Self-pay | Admitting: Nurse Practitioner

## 2014-06-08 DIAGNOSIS — G459 Transient cerebral ischemic attack, unspecified: Secondary | ICD-10-CM

## 2014-06-08 DIAGNOSIS — I2510 Atherosclerotic heart disease of native coronary artery without angina pectoris: Secondary | ICD-10-CM | POA: Diagnosis not present

## 2014-06-08 DIAGNOSIS — I639 Cerebral infarction, unspecified: Secondary | ICD-10-CM

## 2014-06-08 DIAGNOSIS — I25110 Atherosclerotic heart disease of native coronary artery with unstable angina pectoris: Secondary | ICD-10-CM | POA: Diagnosis not present

## 2014-06-08 LAB — BASIC METABOLIC PANEL
Anion gap: 10 (ref 5–15)
BUN: 16 mg/dL (ref 6–20)
CALCIUM: 8.7 mg/dL — AB (ref 8.9–10.3)
CHLORIDE: 108 mmol/L (ref 101–111)
CO2: 23 mmol/L (ref 22–32)
CREATININE: 1.08 mg/dL — AB (ref 0.44–1.00)
GFR, EST AFRICAN AMERICAN: 55 mL/min — AB (ref >60)
GFR, EST NON AFRICAN AMERICAN: 48 mL/min — AB (ref >60)
GLUCOSE: 122 mg/dL — AB (ref 65–99)
Potassium: 4 mmol/L (ref 3.5–5.1)
Sodium: 141 mmol/L (ref 135–145)

## 2014-06-08 LAB — CBC
HEMATOCRIT: 25.6 % — AB (ref 36.0–46.0)
Hemoglobin: 8.3 g/dL — ABNORMAL LOW (ref 12.0–15.0)
MCH: 29.7 pg (ref 26.0–34.0)
MCHC: 32.4 g/dL (ref 30.0–36.0)
MCV: 91.8 fL (ref 78.0–100.0)
Platelets: 156 10*3/uL (ref 150–400)
RBC: 2.79 MIL/uL — ABNORMAL LOW (ref 3.87–5.11)
RDW: 15.2 % (ref 11.5–15.5)
WBC: 4.7 10*3/uL (ref 4.0–10.5)

## 2014-06-08 LAB — LIPID PANEL
CHOL/HDL RATIO: 4.9 ratio
CHOLESTEROL: 199 mg/dL (ref 0–200)
HDL: 41 mg/dL (ref >40)
LDL CALC: 112 mg/dL — AB (ref 0–99)
TRIGLYCERIDES: 228 mg/dL — AB (ref <150)
VLDL: 46 mg/dL — ABNORMAL HIGH (ref 0–40)

## 2014-06-08 LAB — GLUCOSE, CAPILLARY
GLUCOSE-CAPILLARY: 121 mg/dL — AB (ref 65–99)
Glucose-Capillary: 96 mg/dL (ref 65–99)

## 2014-06-08 MED ORDER — FERROUS SULFATE 325 (65 FE) MG PO TABS: 325.0000 mg | ORAL_TABLET | Freq: Two times a day (BID) | ORAL | Status: DC

## 2014-06-08 MED ORDER — ATORVASTATIN CALCIUM 40 MG PO TABS: 40.0000 mg | ORAL_TABLET | Freq: Every day | ORAL | Status: DC

## 2014-06-08 MED ORDER — TOPROL XL 50 MG PO TB24
50.0000 mg | ORAL_TABLET | Freq: Every day | ORAL | Status: DC
Start: 2014-06-08 — End: 2015-02-08

## 2014-06-08 MED ORDER — RAMIPRIL 10 MG PO CAPS: 10.0000 mg | ORAL_CAPSULE | Freq: Every day | ORAL | Status: DC

## 2014-06-08 NOTE — Progress Notes (Signed)
CARDIAC REHAB PHASE I   PRE:  Rate/Rhythm:76 SR    BP: sitting 120/70    SaO2:   MODE:  Ambulation: 600 ft   POST:  Rate/Rhythm: 90 SR    BP: sitting 141/73     SaO2:   Pt up at door with RW, I joined her. Pt able to walk 600 ft with RW, no problems or c/o. Sat and rested at 1/2 way. Ready for d/c.  [id number]   Darrick Meigs CES, ACSM 06/08/2014 9:14 AM

## 2014-06-08 NOTE — Progress Notes (Signed)
*  PRELIMINARY RESULTS* Vascular Ultrasound Carotid Duplex (Doppler) has been completed.  Preliminary findings: Bilateral:  1-39% ICA stenosis.  Vertebral artery flow is antegrade.      Landry Mellow, RDMS, RVT  06/08/2014, 9:27 AM

## 2014-06-08 NOTE — Progress Notes (Addendum)
Subjective: No CP  No SOB   Objective: Filed Vitals:   06/08/14 0010 06/08/14 0359 06/08/14 0400 06/08/14 0742  BP: 150/44 135/37 135/37 113/44  Pulse: 75 70  82  Temp: 97.9 F (36.6 C) 97.9 F (36.6 C)  98.1 F (36.7 C)  TempSrc: Oral Oral  Oral  Resp: 19 17  16   Height:      Weight: 167 lb 1.7 oz (75.8 kg)     SpO2: 96% 97% 97% 98%   Weight change: 2 lb 1.7 oz (0.957 kg)  Intake/Output Summary (Last 24 hours) at 06/08/14 0757 Last data filed at 06/08/14 0742  Gross per 24 hour  Intake    440 ml  Output    500 ml  Net    -60 ml    General: Alert, awake, oriented x3, in no acute distress Neck:  JVP is normal Heart: Regular rate and rhythm, without murmurs, rubs, gallops.  Lungs: Clear to auscultation.  No rales or wheezes. Exemities:  No edema.   Bruising R arm and R leg  Soft   Neuro: Grossly intact, nonfocal.   Lab Results: Results for orders placed or performed during the hospital encounter of 06/06/14 (from the past 24 hour(s))  Glucose, capillary     Status: Abnormal   Collection Time: 06/07/14  5:52 PM  Result Value Ref Range   Glucose-Capillary 131 (H) 65 - 99 mg/dL  Glucose, capillary     Status: Abnormal   Collection Time: 06/07/14  9:09 PM  Result Value Ref Range   Glucose-Capillary 147 (H) 65 - 99 mg/dL  CBC     Status: Abnormal   Collection Time: 06/08/14  3:16 AM  Result Value Ref Range   WBC 4.7 4.0 - 10.5 K/uL   RBC 2.79 (L) 3.87 - 5.11 MIL/uL   Hemoglobin 8.3 (L) 12.0 - 15.0 g/dL   HCT 25.6 (L) 36.0 - 46.0 %   MCV 91.8 78.0 - 100.0 fL   MCH 29.7 26.0 - 34.0 pg   MCHC 32.4 30.0 - 36.0 g/dL   RDW 15.2 11.5 - 15.5 %   Platelets 156 150 - 400 K/uL  Basic metabolic panel     Status: Abnormal   Collection Time: 06/08/14  3:16 AM  Result Value Ref Range   Sodium 141 135 - 145 mmol/L   Potassium 4.0 3.5 - 5.1 mmol/L   Chloride 108 101 - 111 mmol/L   CO2 23 22 - 32 mmol/L   Glucose, Bld 122 (H) 65 - 99 mg/dL   BUN 16 6 - 20 mg/dL   Creatinine, Ser 1.08 (H) 0.44 - 1.00 mg/dL   Calcium 8.7 (L) 8.9 - 10.3 mg/dL   GFR calc non Af Amer 48 (L) >60 mL/min   GFR calc Af Amer 55 (L) >60 mL/min   Anion gap 10 5 - 15  Lipid panel     Status: Abnormal   Collection Time: 06/08/14  3:16 AM  Result Value Ref Range   Cholesterol 199 0 - 200 mg/dL   Triglycerides 228 (H) <150 mg/dL   HDL 41 >40 mg/dL   Total CHOL/HDL Ratio 4.9 RATIO   VLDL 46 (H) 0 - 40 mg/dL   LDL Cholesterol 112 (H) 0 - 99 mg/dL  Glucose, capillary     Status: Abnormal   Collection Time: 06/08/14  6:29 AM  Result Value Ref Range   Glucose-Capillary 121 (H) 65 - 99 mg/dL    Studies/Results: Mr [facility] Contrast  06/07/2014   CLINICAL DATA:  81 year old female with episode of slurred speech 2 weeks ago. Post cardiac catheterization. Initial encounter.  EXAM: MRI HEAD WITHOUT CONTRAST  MRA HEAD WITHOUT CONTRAST  TECHNIQUE: Multiplanar, multiecho pulse sequences of the brain and surrounding structures were obtained without intravenous contrast. Angiographic images of the head were obtained using MRA technique without contrast.  COMPARISON:  None.  FINDINGS: MRI HEAD FINDINGS  Acute small nonhemorrhagic infarct left caudate head.  Moderate small vessel disease type changes.  Global atrophy without hydrocephalus.  No intracranial hemorrhage.  No intracranial mass lesion noted on this unenhanced exam.  Prominent transverse ligament hypertrophy. Spinal stenosis C2-3 and C3-4.  Mega cisterna magna incidentally noted.  Pituitary and pineal region unremarkable.  Post lens replacement otherwise orbital structures unremarkable.  Minimal partial opacification inferior aspect left mastoid air cells. Minimal mucosal thickening ethmoid sinus air cells and inferior maxillary sinuses.  MRA HEAD FINDINGS  Anterior circulation without large vessel stenosis or occlusion.  Fetal type origin of the posterior cerebral artery bilaterally.  Middle cerebral artery branch vessel mild to mild  narrowing.  Right vertebral artery is dominant. No significant stenosis of the distal vertebral arteries or basilar artery. Mild irregularity of the basilar artery.  Mild irregularity posterior cerebral artery distal branches  No aneurysm noted  IMPRESSION: MRI HEAD  Acute small nonhemorrhagic infarct left caudate head.  Moderate small vessel disease type changes.  Global atrophy without hydrocephalus.  Prominent transverse ligament hypertrophy. Spinal stenosis C2-3 and C3-4.  MRA HEAD FINDINGS  Branch vessel atherosclerotic type changes as noted above.   Electronically Signed   By: Genia Del M.D.   On: 06/07/2014 20:00   Mr Brain Wo Contrast  06/07/2014   CLINICAL DATA:  81 year old female with episode of slurred speech 2 weeks ago. Post cardiac catheterization. Initial encounter.  EXAM: MRI HEAD WITHOUT CONTRAST  MRA HEAD WITHOUT CONTRAST  TECHNIQUE: Multiplanar, multiecho pulse sequences of the brain and surrounding structures were obtained without intravenous contrast. Angiographic images of the head were obtained using MRA technique without contrast.  COMPARISON:  None.  FINDINGS: MRI HEAD FINDINGS  Acute small nonhemorrhagic infarct left caudate head.  Moderate small vessel disease type changes.  Global atrophy without hydrocephalus.  No intracranial hemorrhage.  No intracranial mass lesion noted on this unenhanced exam.  Prominent transverse ligament hypertrophy. Spinal stenosis C2-3 and C3-4.  Mega cisterna magna incidentally noted.  Pituitary and pineal region unremarkable.  Post lens replacement otherwise orbital structures unremarkable.  Minimal partial opacification inferior aspect left mastoid air cells. Minimal mucosal thickening ethmoid sinus air cells and inferior maxillary sinuses.  MRA HEAD FINDINGS  Anterior circulation without large vessel stenosis or occlusion.  Fetal type origin of the posterior cerebral artery bilaterally.  Middle cerebral artery branch vessel mild to mild narrowing.   Right vertebral artery is dominant. No significant stenosis of the distal vertebral arteries or basilar artery. Mild irregularity of the basilar artery.  Mild irregularity posterior cerebral artery distal branches  No aneurysm noted  IMPRESSION: MRI HEAD  Acute small nonhemorrhagic infarct left caudate head.  Moderate small vessel disease type changes.  Global atrophy without hydrocephalus.  Prominent transverse ligament hypertrophy. Spinal stenosis C2-3 and C3-4.  MRA HEAD FINDINGS  Branch vessel atherosclerotic type changes as noted above.   Electronically Signed   By: Genia Del M.D.   On: 06/07/2014 20:00    Medications: REviewed     @PROBHOSP @  1.  Neuro  MRI with evid of  acute CVA  Hx of pt is that her symptoms of speech problems occurred after first intervention.  Transient   Had carotid USN this AM  Await rsults   Will review with neuro    2.  CAD  Cath on 5/19 with DES to RCA  Comptlicated by IWMI  S/p athrectomy with DES to prox LAD 5/25  Continue medical Rx    3.  HTN  Adequate control    4  HL  ON lipitor  Need to fllow for arthragias  5.  DM      Dorris Carnes 06/08/2014, 7:57 AM  Addendum:  SPoke to neurology  They have reviewed images (MRI, carotid)  No furhter recommendations  Defect was small  Continue ASA/Plavix and lipitor.    Dorris Carnes

## 2014-06-08 NOTE — Discharge Instructions (Signed)

## 2014-06-08 NOTE — Discharge Summary (Signed)
Discharge Summary   Patient ID: [name],  MRN: [id number], DOB/AGE: May 06, 1933 81 y.o.  Admit date: 06/06/2014 Discharge date: 06/08/2014  Primary Care Provider: SPARKS,JEFFREY D Primary Cardiologist: Johnny Bridge, MD   Discharge Diagnoses Principal Problem:   Unstable angina  **s/p prior PCI of the RCA mid May 2016.  **s/p PCI/DES to the LAD this admission.  Active Problems:   CAD (coronary artery disease)   Hypertension   Stroke  **Noted on MRI/A this admission.  Neuro recs conservative Rx.  Felt to have occurred following prior cath/pci.   Hyperlipidemia   Anemia  Allergies Allergies  Allergen Reactions  . Ciprofloxacin Diarrhea and Nausea And Vomiting  . Codeine Diarrhea and Nausea And Vomiting  . Metronidazole Diarrhea and Nausea And Vomiting  . Sulfa Antibiotics Other (See Comments)    Distress  . Bacitracin-Polymyxin B Rash  . Celecoxib Itching and Rash  . Petrolatum-Zinc Oxide Hives  . Statins Other (See Comments)    Reaction:  Muscle pain  . Vioxx [Rofecoxib] Itching and Rash   Procedures  Cardiac Catheterization and Percutaneous Coronary Intervention 5.25.2016  Successful orbital atherectomy and drug-eluting stent placement to the proximal LAD using a 2.5 x 15 mm Xience Alpine DES. _____________   MRI/A of the Brain 5.26.2016  IMPRESSION: MRI HEAD   Acute small nonhemorrhagic infarct left caudate head. Moderate small vessel disease type changes. Global atrophy without hydrocephalus. Prominent transverse ligament hypertrophy. Spinal stenosis C2-3 and C3-4.   MRA HEAD FINDINGS   Branch vessel atherosclerotic type changes as noted above. _____________   Carotid Ultrasound 5.27.2016  Vascular Ultrasound Carotid Duplex (Doppler) has been completed.  Preliminary findings: Bilateral:  1-39% ICA stenosis.  Vertebral artery flow is antegrade.   _____________   History of Present Illness  81 year old female with a history of coronary artery  disease, hypertension, hyperlipidemia, and type 2 diabetes mellitus. She was recently evaluated by our team in [place] for complaints of fatigue and chest heaviness. She subsequently underwent diagnostic heart catheterization on May 19, revealing severe LAD and RCA disease. Percutaneous intervention and drug-eluting stent placement was performed within the right coronary artery. Initially, she was noted to have slow flow with inferior ST elevation and chest pain the symptoms resolved with additional intervention. The LAD was also felt to be amenable for PCI but it was felt that she would require a Rotablator which could be performed at a later date. She was later discharged from [facility] and scheduled for elective LAD PCI at [facility].  Hospital Course  Patient presented to the [facility] cardiac Physician laboratory on May 25, and underwent successful orbital atherectomy and drug eluding stent placement within the LAD using a 2.5 x 15 mm Xience Alpine DES.  She tolerated the procedure well and post procedure had no recurrent chest discomfort. On May 26, she mentioned that she had an episode of garbled speech between her admission in [place] and subsequent admission in [place]. MRI/MRA of the brain was ordered and returned notable for an acute small nonhemorrhagic infarct of the left caudate with moderate small vessel disease. She did not have any acute neurologic deficits. Her case was discussed with neurology. Carotid ultrasound was performed and showed bilateral 1-39% internal carotid artery stenosis. Conservative, medical therapy is recommended.  From a cardiac standpoint, she is been doing well and ambulating without symptoms or limitations. She will be discharged home today in good condition with follow-up arranged in approximately 2 weeks.  Discharge Vitals Blood pressure 135/37, pulse  70, temperature 98 F (36.7 C), temperature source Oral, resp. rate 16, height '5\' 3"'  (1.6 m),  weight 167 lb 1.7 oz (75.8 kg), SpO2 98 %.  Filed Weights   06/06/14 1210 06/07/14 0000 06/08/14 0010  Weight: 165 lb (74.844 kg) 166 lb 14.2 oz (75.7 kg) 167 lb 1.7 oz (75.8 kg)   Labs  CBC  Recent Labs  06/07/14 0326 06/08/14 0316  WBC 4.7 4.7  HGB 8.0* 8.3*  HCT 24.7* 25.6*  MCV 91.1 91.8  PLT 163 356   Basic Metabolic Panel  Recent Labs  06/07/14 0326 06/08/14 0316  NA 141 141  K 4.0 4.0  CL 111 108  CO2 23 23  GLUCOSE 99 122*  BUN 14 16  CREATININE 0.91 1.08*  CALCIUM 8.5* 8.7*   Fasting Lipid Panel  Recent Labs  06/08/14 0316  CHOL 199  HDL 41  LDLCALC 112*  TRIG 228*  CHOLHDL 4.9   Disposition  Pt is being discharged home today in good condition.  Follow-up Plans & Appointments      Follow-up Information    Follow up with Murray Hodgkins, NP On 06/22/2014.   Why:  1:30 PM - Dr. Donivan Scull Nurse Practitioner   Contact information:   [facility] [id number] [place], [place] [address]      Follow up with SPARKS,JEFFREY D, MD.   Specialty:  Internal Medicine   Why:  as scheduled.   Contact information:   [place] [address] [phone]       Discharge Medications    Medication List    TAKE these medications        ALPRAZolam 0.25 MG tablet  Commonly known as:  XANAX  Take 0.25 mg by mouth at bedtime as needed.     aspirin EC 81 MG tablet  Take 81 mg by mouth daily.     atorvastatin 40 MG tablet  Commonly known as:  LIPITOR  Take 1 tablet (40 mg total) by mouth daily at 6 PM.     BLOOD GLUCOSE TEST STRIPS Strp  Use once daily. Use as instructed.     Cholecalciferol 1000 UNITS tablet  Take 1,000 Units by mouth daily.     clopidogrel 75 MG tablet  Commonly known as:  PLAVIX  Take 1 tablet (75 mg total) by mouth daily.     ferrous sulfate 325 (65 FE) MG tablet  Take 1 tablet (325 mg total) by mouth 2 (two) times daily with a meal.     FIFTY50 GLUCOSE  METER 2.0 W/DEVICE Kit  Use as directed (Countour Meter).     levothyroxine 50 MCG tablet  Commonly known as:  SYNTHROID, LEVOTHROID  Take 50 mcg by mouth daily before breakfast.     metFORMIN 500 MG tablet  Commonly known as:  GLUCOPHAGE  Take 500 mg by mouth 2 (two) times daily with a meal.     nitroGLYCERIN 0.4 MG SL tablet  Commonly known as:  NITROSTAT  Place 1 tablet (0.4 mg total) under the tongue every 5 (five) minutes as needed for chest pain.     pantoprazole 40 MG tablet  Commonly known as:  PROTONIX  Take 40 mg by mouth daily.     PRESERVISION AREDS 2 Caps  Take 1 capsule by mouth 2 (two) times daily.     ramipril 10 MG capsule  Commonly known as:  ALTACE  Take 1 capsule (10 mg total) by mouth  daily.     TOPROL XL 50 MG 24 hr tablet  Generic drug:  metoprolol succinate  Take 1 tablet (50 mg total) by mouth daily.     Turmeric 500 MG Caps  Take 500 mg by mouth daily.       Outstanding Labs/Studies  None  Duration of Discharge Encounter   Greater than 30 minutes including physician time.  Signed, Murray Hodgkins NP 06/08/2014, 1:13 PM

## 2014-06-10 ENCOUNTER — Encounter: Payer: Self-pay | Admitting: Oncology

## 2014-06-10 NOTE — Progress Notes (Signed)
[facility] [phone]  Fax:([phone] [place]: 02-11-1933  MR#: [id number]  LFY#:[id number]  Patient Care Team: Idelle Crouch, MD as PCP - General (Internal Medicine)  CHIEF COMPLAINT:  Chief Complaint  Patient presents with  . Follow-up    Oncology History   1.papillary carcinoma of thyroid (right lobe)multifocal.  0.5 cm tumor extending into perithyroid soft tissue status post total thyroidectomy.  One lymph node was negative.  No angiolymphatic invasion seen T3 N0 M0 stage IIIa disease(diagnosis in March of2016) 2.  Status post radioactive iodine therapy in May of 2016     Thyroid cancer   04/11/2014 Initial Diagnosis Thyroid cancer    Oncology Flowsheet 05/31/2014 05/31/2014 06/06/2014 06/08/2014  ALPRAZolam (XANAX) PO - - - 0.25 mg  ondansetron (ZOFRAN) IJ - - - -  ondansetron (ZOFRAN) IV 4 mg   - -    INTERVAL HISTORY:  81 year old lady has just finished radioactive iodine therapy came today for the follow-up.  He tolerated treatment very well.  Patient started on Synthroid had some swelling in the neck area which is gradually resolving.  REVIEW OF SYSTEMS:   Gen. status: No chills and fever.  Appetite has been stable. HEENT: Swelling in the neck area has not improved Respiratory system: No cough.  No hemoptysis.  No shortness of breath at rest or exertion.  No chest pain. Cardiovascular system: No chest pain.  No palpitation.  No paroxysmal nocturnal dyspnea. Gastro intestinal system: No heartburn.  No nausea or vomiting.  No abdominal pain.  No diarrhea.  No constipation.  No rectal bleeding. Skin: No evidence of ecchymosis or rash. Neurological system: No dizziness.  No tingling.  His was.  no tingling numbness.  no focal weakness or any focal signs. Lower extremities no edema  As per HPI. Otherwise, a complete review of systems is negatve.  PAST MEDICAL HISTORY: Past Medical History  Diagnosis Date  .  Hyperlipidemia   . Hypertension   . Mild reactive airways disease   . Chronic anxiety   . Esophagitis   . Diverticulosis   . History of colon polyps   . CAD (coronary artery disease)     a. lexiscan 08/2013: nl wall motion, no ischemia, EF 50-55%; b. cardiac cath 05/31/2014: mLAD 90%, dLAD 50%, dLCx 60%, 1st Mrg 80%, pRCA 60% s/p PCI/DES, mRCA 1st lesion 70%, mRCA 2nd 95% s/p PCI/long DES to cover entire mRCA, RPLB 40%;  b. 05/2014 Staged PCI of LAD w/ 2.5 x 15 mm Xience Alpine DES.  Marland Kitchen History of echocardiogram     a. 08/2013: normal LVSF, nl RVSF, no valvular regurgitation or stenosis   . Carotid artery disease   . PONV (postoperative nausea and vomiting)   . Anginal pain   . Hypothyroidism   . Thyroid cancer     a. s/p right sided hemi-thyroidectomy; b. planning for radioactive iodine tx; c. followed by Dr. Marcelene Butte  . Type 2 diabetes mellitus   . GERD (gastroesophageal reflux disease)   . Osteoarthritis   . Rheumatoid arthritis   . Stroke     a. 05/2014 pt c/o garbled speech following cath 5/19. MRI/A 5/26 showed left caudate infarct->conservative mgmt per neuro.    PAST SURGICAL HISTORY: Past Surgical History  Procedure Laterality Date  . Lumbar laminectomy  X 6  . Meniscus repair Bilateral     "2 on the right; 1 on the left"  . Breast implant removal  06/2001  .  Abdominal hysterectomy    . Rotator cuff repair      "I think 2 left, 2 right"  . Cholecystectomy    . Ganglion cyst excision Right     AC joint  . Thyroidectomy    . Coronary angioplasty with stent placement  05/31/2014; 06/06/2014    "2; 1"  . Tonsillectomy    . Appendectomy    . Carpal tunnel release      "I've have a total of 7" (06/06/2014)  . Back surgery    . Augmentation mammaplasty    . Cataract extraction w/ intraocular lens  implant, bilateral Bilateral   . Tubal ligation    . Cardiac catheterization N/A 06/06/2014    Procedure: Coronary/Graft Atherectomy;  Surgeon: Wellington Hampshire, MD;  Location: [place] [facility] [facility];  Service: Cardiovascular;  Laterality: N/A;    FAMILY HISTORY Family History  Problem Relation Age of Onset  . Heart disease Sister     stent placed x 3     GYNECOLOGIC HISTORY:  No LMP recorded. Patient is postmenopausal.     ADVANCED DIRECTIVES:  Patient does have advanced health care directive  HEALTH MAINTENANCE: History  Substance Use Topics  . Smoking status: Never Smoker   . Smokeless tobacco: Never Used  . Alcohol Use: No      Allergies  Allergen Reactions  . Ciprofloxacin Diarrhea and Nausea And Vomiting  . Codeine Diarrhea and Nausea And Vomiting  . Metronidazole Diarrhea and Nausea And Vomiting  . Sulfa Antibiotics Other (See Comments)    Distress  . Bacitracin-Polymyxin B Rash  . Celecoxib Itching and Rash  . Petrolatum-Zinc Oxide Hives  . Statins Other (See Comments)    Reaction:  Muscle pain  . Vioxx [Rofecoxib] Itching and Rash    Current Outpatient Prescriptions  Medication Sig Dispense Refill  . ALPRAZolam (XANAX) 0.25 MG tablet Take 0.25 mg by mouth at bedtime as needed.     Marland Kitchen aspirin EC 81 MG tablet Take 81 mg by mouth daily.     . Blood Glucose Monitoring Suppl (FIFTY50 GLUCOSE METER 2.0) W/DEVICE KIT Use as directed (Countour Meter).    . Cholecalciferol 1000 UNITS tablet Take 1,000 Units by mouth daily.     Marland Kitchen levothyroxine (SYNTHROID, LEVOTHROID) 50 MCG tablet Take 50 mcg by mouth daily before breakfast.   0  . nitroGLYCERIN (NITROSTAT) 0.4 MG SL tablet Place 1 tablet (0.4 mg total) under the tongue every 5 (five) minutes as needed for chest pain. 25 tablet 6  . Turmeric 500 MG CAPS Take 500 mg by mouth daily.    Marland Kitchen atorvastatin (LIPITOR) 40 MG tablet Take 1 tablet (40 mg total) by mouth daily at 6 PM. 30 tablet 6  . clopidogrel (PLAVIX) 75 MG tablet Take 1 tablet (75 mg total) by mouth daily. 30 tablet 11  . ferrous sulfate 325 (65 FE) MG tablet Take 1 tablet (325 mg total) by mouth 2 (two) times daily with a meal. 60 tablet 3   . Glucose Blood (BLOOD GLUCOSE TEST STRIPS) STRP Use once daily. Use as instructed.    . metFORMIN (GLUCOPHAGE) 500 MG tablet Take 500 mg by mouth 2 (two) times daily with a meal.    . Multiple Vitamins-Minerals (PRESERVISION AREDS 2) CAPS Take 1 capsule by mouth 2 (two) times daily.    . pantoprazole (PROTONIX) 40 MG tablet Take 40 mg by mouth daily.    . ramipril (ALTACE) 10 MG capsule Take 1 capsule (10 mg total)  by mouth daily. 30 capsule 6  . TOPROL XL 50 MG 24 hr tablet Take 1 tablet (50 mg total) by mouth daily.     No current facility-administered medications for this visit.    OBJECTIVE:  Filed Vitals:   05/22/14 1234  BP: 146/70  Pulse: 64  Temp: 96.2 F (35.7 C)     Body mass index is 29.49 kg/(m^2).    ECOG FS:1 - Symptomatic but completely ambulatory  PHYSICAL EXAM: GENERAL:  Well developed, well nourished, sitting comfortably in the exam room in no acute distress. MENTAL STATUS:  Alert and oriented to person, place and time. HEAD:    Pupils equal round and reactive to light and accomodation.  No conjunctivitis or scleral icterus. ENT:  Oropharynx clear without lesion.  Tongue normal. Mucous membranes moist.  Thyroid: No enlargement RESPIRATORY:  Clear to auscultation without rales, wheezes or rhonchi. CARDIOVASCULAR:  Regular rate and rhythm without murmur, rub or gallop. BREAST:  Right breast without masses, skin changes or nipple discharge.  Left breast without masses, skin changes or nipple discharge. ABDOMEN:  Soft, non-tender, with active bowel sounds, and no hepatosplenomegaly.  No masses. BACK:  No CVA tenderness.  No tenderness on percussion of the back or rib cage. SKIN:  No rashes, ulcers or lesions. EXTREMITIES: No edema, no skin discoloration or tenderness.  No palpable cords. LYMPH NODES: No palpable cervical, supraclavicular, axillary or inguinal adenopathy  NEUROLOGICAL: Unremarkable. PSYCH:  Appropriate.   LAB RESULTS:  Appointment on 05/22/2014    Component Date Value Ref Range Status  . WBC 05/22/2014 5.1  3.6 - 11.0 K/uL Final  . RBC 05/22/2014 4.18  3.80 - 5.20 MIL/uL Final  . Hemoglobin 05/22/2014 12.3  12.0 - 16.0 g/dL Final  . HCT 05/22/2014 37.7  35.0 - 47.0 % Final  . MCV 05/22/2014 90.0  80.0 - 100.0 fL Final  . MCH 05/22/2014 29.3  26.0 - 34.0 pg Final  . MCHC 05/22/2014 32.6  32.0 - 36.0 g/dL Final  . RDW 05/22/2014 15.8* 11.5 - 14.5 % Final  . Platelets 05/22/2014 174  150 - 440 K/uL Final  . Neutrophils Relative % 05/22/2014 79   Final  . Neutro Abs 05/22/2014 4.1  1.4 - 6.5 K/uL Final  . Lymphocytes Relative 05/22/2014 12   Final  . Lymphs Abs 05/22/2014 0.6* 1.0 - 3.6 K/uL Final  . Monocytes Relative 05/22/2014 7   Final  . Monocytes Absolute 05/22/2014 0.3  0.2 - 0.9 K/uL Final  . Eosinophils Relative 05/22/2014 1   Final  . Eosinophils Absolute 05/22/2014 0.1  0 - 0.7 K/uL Final  . Basophils Relative 05/22/2014 1   Final  . Basophils Absolute 05/22/2014 0.0  0 - 0.1 K/uL Final  . Sodium 05/22/2014 137  135 - 145 mmol/L Final  . Potassium 05/22/2014 3.6  3.5 - 5.1 mmol/L Final  . Chloride 05/22/2014 106  101 - 111 mmol/L Final  . CO2 05/22/2014 25  22 - 32 mmol/L Final  . Glucose, Bld 05/22/2014 193* 65 - 99 mg/dL Final  . BUN 05/22/2014 21* 6 - 20 mg/dL Final  . Creatinine, Ser 05/22/2014 0.95  0.44 - 1.00 mg/dL Final  . Calcium 05/22/2014 9.2  8.9 - 10.3 mg/dL Final  . Total Protein 05/22/2014 7.3  6.5 - 8.1 g/dL Final  . Albumin 05/22/2014 4.3  3.5 - 5.0 g/dL Final  . AST 05/22/2014 26  15 - 41 U/L Final  . ALT 05/22/2014 16  14 -  54 U/L Final  . Alkaline Phosphatase 05/22/2014 55  38 - 126 U/L Final  . Total Bilirubin 05/22/2014 0.5  0.3 - 1.2 mg/dL Final  . GFR calc non Af Amer 05/22/2014 56* >60 mL/min Final  . GFR calc Af Amer 05/22/2014 >60  >60 mL/min Final   Comment: (NOTE) The eGFR has been calculated using the CKD EPI equation. This calculation has not been validated in all clinical  situations. eGFR's persistently <60 mL/min signify possible Chronic Kidney Disease.   Georgiann Hahn gap 05/22/2014 6  5 - 15 Final   Performed at [facility]  . T4, Total 05/22/2014 3.8* 4.5 - 12.0 ug/dL Final   Comment: (NOTE) Performed At: [facility] [place], [place] [id number] Lindon Romp MD [id number]   . TSH 05/22/2014 >90.000* 0.350 - 4.500 uIU/mL Final  . Thyroglobulin Antibody 05/22/2014 <1.0  0.0 - 0.9 IU/mL Final   Comment: (NOTE) Thyroglobulin Antibody measured by [facility] Methodology Performed At: [facility] [place], [place] [id number] Lindon Romp MD BW:[id number]     No results found for: LABCA2   ASSESSMENT: 81 year old patient with carcinoma of the thyroid T3 disease.  Status post total thyroidectomy Patient had a radioactive iodine therapy on May 5 Started on Synthroid  MEDICAL DECISION MAKING:  Lab data has been reviewed Patient would have T4 and TSH checked periodically to make chest and does Synthroid  Patient expressed understanding and was in agreement with this plan. She also understands that She can call clinic at any time with any questions, concerns, or complaints.    Thyroid cancer   Staging form: Thyroid - Papillary or Follicular (Under 45 years), AJCC 7th Edition     Clinical: Stage I (T3, N0, M0) - Signed by Forest Gleason, MD on 06/10/2014   Forest Gleason, MD   06/10/2014 7:14 AM

## 2014-06-12 ENCOUNTER — Other Ambulatory Visit: Payer: Self-pay | Admitting: Oncology

## 2014-06-12 ENCOUNTER — Telehealth: Payer: Self-pay | Admitting: *Deleted

## 2014-06-12 NOTE — Telephone Encounter (Signed)
Left message for [name] to call [facility] to set up orientation appointment.

## 2014-06-13 ENCOUNTER — Encounter: Payer: Self-pay | Admitting: Cardiovascular Disease

## 2014-06-15 ENCOUNTER — Telehealth: Payer: Self-pay | Admitting: Cardiovascular Disease

## 2014-06-15 NOTE — Telephone Encounter (Signed)
Difficult to assess over the phone. If patient is concerned she should go have it checked. Patient recently underwent 2 caths there is likely to still be some bruising. Concern would be if the bruising was enlarging/active bleeding. Again, difficult to say over the phone without examining.   My advice would be go have the above evaluated.

## 2014-06-15 NOTE — Telephone Encounter (Signed)
Back pain at bra line for last 2 days and is worried that this may be related to recent heart catheterizations.  Patient does not normally have back pain.  Patient also has pain in her RLE since cath and it is still black in color.  Please call patient to discuss

## 2014-06-15 NOTE — Telephone Encounter (Signed)
Left message for pt w/ Ryan's recommendation. Advised her to seek care at Urgent Care or ED if she feels that the bruising is worsening or if her sx become emergent.  Asked her to call back w/ any questions or concerns.

## 2014-06-19 ENCOUNTER — Inpatient Hospital Stay: Payer: Medicare Other | Attending: Oncology

## 2014-06-19 DIAGNOSIS — C73 Malignant neoplasm of thyroid gland: Secondary | ICD-10-CM | POA: Diagnosis not present

## 2014-06-19 DIAGNOSIS — D649 Anemia, unspecified: Secondary | ICD-10-CM | POA: Diagnosis not present

## 2014-06-19 LAB — TSH: TSH: 61.606 u[IU]/mL — ABNORMAL HIGH (ref 0.350–4.500)

## 2014-06-20 ENCOUNTER — Other Ambulatory Visit: Payer: Self-pay | Admitting: Family Medicine

## 2014-06-20 ENCOUNTER — Other Ambulatory Visit: Payer: Self-pay | Admitting: Oncology

## 2014-06-20 ENCOUNTER — Other Ambulatory Visit: Payer: Self-pay | Admitting: *Deleted

## 2014-06-20 ENCOUNTER — Telehealth: Payer: Self-pay | Admitting: *Deleted

## 2014-06-20 DIAGNOSIS — C73 Malignant neoplasm of thyroid gland: Secondary | ICD-10-CM

## 2014-06-20 LAB — T4: T4, Total: 5.8 ug/dL (ref 4.5–12.0)

## 2014-06-20 LAB — THYROGLOBULIN ANTIBODY: Thyroglobulin Antibody: <1 IU/mL (ref 0.0–0.9)

## 2014-06-20 MED ORDER — LEVOTHYROXINE SODIUM 75 MCG PO TABS
75.0000 ug | ORAL_TABLET | Freq: Every day | ORAL | Status: DC
Start: 2014-06-20 — End: 2014-07-17

## 2014-06-20 NOTE — Telephone Encounter (Signed)
New Rx for levothyroxine 26mcg called into pharmacy due to elevated TSH. Pt instructed to start taking and will recheck labs at next appt.

## 2014-06-22 ENCOUNTER — Ambulatory Visit (INDEPENDENT_AMBULATORY_CARE_PROVIDER_SITE_OTHER): Payer: Medicare Other

## 2014-06-22 ENCOUNTER — Ambulatory Visit: Payer: Medicare Other | Admitting: Nurse Practitioner

## 2014-06-22 ENCOUNTER — Encounter: Payer: Self-pay | Admitting: Nurse Practitioner

## 2014-06-22 ENCOUNTER — Ambulatory Visit (INDEPENDENT_AMBULATORY_CARE_PROVIDER_SITE_OTHER): Payer: Medicare Other | Admitting: Nurse Practitioner

## 2014-06-22 VITALS — BP 122/60 | HR 73 | Ht 63.0 in | Wt 164.8 lb

## 2014-06-22 DIAGNOSIS — I10 Essential (primary) hypertension: Secondary | ICD-10-CM

## 2014-06-22 DIAGNOSIS — M7989 Other specified soft tissue disorders: Secondary | ICD-10-CM | POA: Diagnosis not present

## 2014-06-22 DIAGNOSIS — E785 Hyperlipidemia, unspecified: Secondary | ICD-10-CM

## 2014-06-22 DIAGNOSIS — E119 Type 2 diabetes mellitus without complications: Secondary | ICD-10-CM | POA: Insufficient documentation

## 2014-06-22 DIAGNOSIS — I2510 Atherosclerotic heart disease of native coronary artery without angina pectoris: Secondary | ICD-10-CM

## 2014-06-22 NOTE — Progress Notes (Signed)
Patient Name: [name] Date of Encounter: 06/22/2014  Primary Care Provider:  Idelle Crouch, MD Primary Cardiologist:  Johnny Bridge, MD   Chief Complaint  81 y/o female s/p staged PCI of the RCA and LAD who presents for f/u.  Past Medical History   Past Medical History  Diagnosis Date  . Hyperlipidemia   . Hypertension   . Mild reactive airways disease   . Chronic anxiety   . Esophagitis   . Diverticulosis   . History of colon polyps   . CAD (coronary artery disease)     a. lexiscan 08/2013: nl wall motion, no ischemia, EF 50-55%; b. cardiac cath 05/31/2014: mLAD 90%, dLAD 50%, dLCx 60%, 1st Mrg 80%, pRCA 60% s/p PCI/DES, mRCA 1st lesion 70%, mRCA 2nd 95% s/p PCI/long DES to cover entire mRCA, RPLB 40%;  c. 05/2014 Staged PCI of LAD w/ 2.5 x 15 mm Xience Alpine DES.  Marland Kitchen History of echocardiogram     a. 08/2013: normal LVSF, nl RVSF, no valvular regurgitation or stenosis   . Carotid artery disease   . PONV (postoperative nausea and vomiting)   . Hypothyroidism   . Thyroid cancer     a. s/p right sided hemi-thyroidectomy; b. planning for radioactive iodine tx; c. followed by Dr. Marcelene Butte  . Type 2 diabetes mellitus   . GERD (gastroesophageal reflux disease)   . Osteoarthritis   . Rheumatoid arthritis   . Stroke     a. 05/2014 pt c/o garbled speech following cath 5/19. MRI/A 5/26 showed left caudate infarct->conservative mgmt per neuro.   Past Surgical History  Procedure Laterality Date  . Lumbar laminectomy  X 6  . Meniscus repair Bilateral     "2 on the right; 1 on the left"  . Breast implant removal  06/2001  . Abdominal hysterectomy    . Rotator cuff repair      "I think 2 left, 2 right"  . Cholecystectomy    . Ganglion cyst excision Right     AC joint  . Thyroidectomy    . Coronary angioplasty with stent placement  05/31/2014; 06/06/2014    "2; 1"  . Tonsillectomy    . Appendectomy    . Carpal tunnel release      "I've have a total of 7" (06/06/2014)  . Back  surgery    . Augmentation mammaplasty    . Cataract extraction w/ intraocular lens  implant, bilateral Bilateral   . Tubal ligation    . Cardiac catheterization N/A 06/06/2014    Procedure: Coronary/Graft Atherectomy;  Surgeon: Wellington Hampshire, MD;  Location: [place] [facility];  Service: Cardiovascular;  Laterality: N/A;  . Cardiac catheterization N/A 05/31/2014    Procedure: Left Heart Cath;  Surgeon: Minna Merritts, MD;  Location: [place] [facility];  Service: Cardiovascular;  Laterality: N/A;  . Cardiac catheterization N/A 05/31/2014    Procedure: Coronary Stent Intervention;  Surgeon: Wellington Hampshire, MD;  Location: [place] [facility];  Service: Cardiovascular;  Laterality: N/A;   Allergies  Allergies  Allergen Reactions  . Ciprofloxacin Diarrhea and Nausea And Vomiting  . Codeine Diarrhea and Nausea And Vomiting  . Metronidazole Diarrhea and Nausea And Vomiting  . Sulfa Antibiotics Other (See Comments)    Distress  . Bacitracin-Polymyxin B Rash  . Celecoxib Itching and Rash  . Petrolatum-Zinc Oxide Hives  . Statins Other (See Comments)    Reaction:  Muscle pain  . Vioxx [Rofecoxib] Itching and Rash  HPI  81 y/o female with the above problem list.  She was recently evaluated for complaints of fatigue and chest heaviness. She subsequently underwent diagnostic heart catheterization on May 19, revealing severe LAD and RCA disease. Percutaneous intervention and drug-eluting stent placement was performed within the right coronary artery. Initially, she was noted to have slow flow with inferior ST elevation and chest pain the symptoms resolved with additional intervention. The LAD was also felt to be amenable for PCI but it was felt that she would require a Rotablator which could be performed at a later date. She was later discharged from [facility] and scheduled for elective LAD PCI at [facility]. Following d/c from [facility], she had an episode of garbled speech, which she did  not seek medical treatment for.  It resolved spontaneously within a few hrs.  She presented for elective LAD atherectomy and stenting on 5/25.  This was performed w/o complication.  She mentioned the episode of garbled speech after PCI and MRI/A of the brain was performed and showed an acute, small, nonhemorrhagic infarct of the left caudate with moderate small vessel disease. She did not have any acute neurologic deficits. Her case was discussed with neurology. Carotid ultrasound was performed and showed bilateral 1-39% internal carotid artery stenosis. Conservative, medical therapy is recommended. She was subsequently d/c'd.    Since d/c, she has done well.  She is tolerating her medications and has not had any c/p or dyspnea.  She has developed Right ankle and foot swelling and tenderness, which is new for her.  She has been ambulating some and does plan to partake in cardiac rehab, but hasn't quite resumed her usual level of activity yet.  She denies palpitations, pnd, orthopnea, n, v, dizziness, syncope, edema, weight gain, or early satiety.   Home Medications  Prior to Admission medications   Medication Sig Start Date End Date Taking? Authorizing Provider  ALPRAZolam (XANAX) 0.25 MG tablet Take 0.25 mg by mouth at bedtime as needed.  08/23/13  Yes Historical Provider, MD  aspirin EC 81 MG tablet Take 81 mg by mouth daily.    Yes Historical Provider, MD  atorvastatin (LIPITOR) 40 MG tablet Take 1 tablet (40 mg total) by mouth daily at 6 PM. 06/08/14  Yes Rogelia Mire, NP  Blood Glucose Monitoring Suppl (FIFTY50 GLUCOSE METER 2.0) W/DEVICE KIT Use as directed (Countour Meter). 12/06/13 12/06/14 Yes Historical Provider, MD  Cholecalciferol 1000 UNITS tablet Take 1,000 Units by mouth daily.    Yes Historical Provider, MD  clopidogrel (PLAVIX) 75 MG tablet Take 1 tablet (75 mg total) by mouth daily. 06/01/14  Yes Ryan M Dunn, PA-C  ferrous sulfate 325 (65 FE) MG tablet Take 1 tablet (325 mg total)  by mouth 2 (two) times daily with a meal. 06/08/14  Yes Rogelia Mire, NP  Glucose Blood (BLOOD GLUCOSE TEST STRIPS) STRP Use once daily. Use as instructed. 12/06/13 12/06/14 Yes Historical Provider, MD  levothyroxine (SYNTHROID) 75 MCG tablet Take 1 tablet (75 mcg total) by mouth daily before breakfast. 06/20/14  Yes Evlyn Kanner, NP  metFORMIN (GLUCOPHAGE) 500 MG tablet Take 500 mg by mouth 2 (two) times daily with a meal.   Yes Historical Provider, MD  Multiple Vitamins-Minerals (PRESERVISION AREDS 2) CAPS Take 1 capsule by mouth 2 (two) times daily.   Yes Historical Provider, MD  nitroGLYCERIN (NITROSTAT) 0.4 MG SL tablet Place 1 tablet (0.4 mg total) under the tongue every 5 (five) minutes as needed for chest  pain. 04/11/14  Yes Minna Merritts, MD  pantoprazole (PROTONIX) 40 MG tablet Take 40 mg by mouth daily.   Yes Historical Provider, MD  ramipril (ALTACE) 10 MG capsule Take 1 capsule (10 mg total) by mouth daily. 06/08/14  Yes Rogelia Mire, NP  TOPROL XL 50 MG 24 hr tablet Take 1 tablet (50 mg total) by mouth daily. 06/08/14  Yes Rogelia Mire, NP  Turmeric 500 MG CAPS Take 500 mg by mouth daily.   Yes Historical Provider, MD    Review of Systems  RLE swelling as above.  She denies chest pain, palpitations, dyspnea, pnd, orthopnea, n, v, dizziness, syncope, weight gain, or early satiety.  All other systems reviewed and are otherwise negative except as noted above.  Physical Exam  VS:  BP 122/60 mmHg  Pulse 73  Ht _0  (1.6 m)  Wt 164 lb 12 oz (74.73 kg)  BMI 29.19 kg/m2 , BMI Body mass index is 29.19 kg/(m^2). GEN: Well nourished, well developed, in no acute distress. HEENT: normal. Neck: Supple, no JVD, carotid bruits, or masses. Cardiac: RRR, no murmurs, rubs, or gallops. No clubbing, cyanosis.  2+ RLE edema to the knee with mild tenderness to palpation. No erythema or palpable cord. Radials/DP/PT 2+ and equal bilaterally. R radial cath site w/o  bleeding/bruit/hematoma. Respiratory:  Respirations regular and unlabored, clear to auscultation bilaterally. GI: Soft, nontender, nondistended, BS + x 4. MS: no deformity or atrophy. Skin: warm and dry, no rash. Neuro:  Strength and sensation are intact. Psych: Normal affect.  Accessory Clinical Findings  ECG - rsr, no acute st/t changes.  Assessment & Plan  1.  CAD:  S/p PCI of the RCA and subsequent staged PCI, atherectomy, and stenting of the LAD.  She has not had chest pain or dyspnea since discharge and is tolerating her meds well.  Cont asa, statin, bb, acei, plavix, and prn nitrates.  2.  RLE Swelling:  Since d/c from the hospital, pt has developed RLE swelling and mild tenderness.  U/S was performed in the office today and revealed no evidence of DVT.  She did have significant bruising r/t hematoma following cath with RFA access in May.  This has resolved.  I've recommended that she keep her R leg elevated when not ambulating.  3.  Essential HTN:  Stable on bb/acei.  4.  HL:  Cont statin therapy.  LDL 112 on 5/16 with nl LFT's.  Statin was started @ that time.  She will need f/u lipids/lft's in a month.  5.  DM II:  On metformin- managed by IM.  6.  S/P CVA:  Pt had an episode of garbled speech while @ home sometime between RCA PCI and subsequent LAD PCI.  MRI/A @ [facility] showed an acute, small, nonhemorrhagic infarct of the left caudate with moderate small vessel disease.  Carotid U/S showed 1-39% bilat carotid dzs.  Neuro rec conservative Rx.  Cont asa/plavix.  7.  Dispo:  F/U Dr. Rockey Situ in 2 mos or sooner if necessary.   Murray Hodgkins, NP 06/22/2014, 2:09 PM

## 2014-06-28 NOTE — Telephone Encounter (Signed)
This encounter was created in error - please disregard.

## 2014-07-06 ENCOUNTER — Encounter: Payer: Medicare Other | Admitting: Cardiovascular Disease

## 2014-07-11 ENCOUNTER — Telehealth: Payer: Self-pay | Admitting: Cardiovascular Disease

## 2014-07-11 NOTE — Telephone Encounter (Signed)
Lipitor is making her legs hurt.   She is requesting a non-statin if possible.

## 2014-07-11 NOTE — Telephone Encounter (Signed)
No good alternate other than statins, Could try alternate statin medication (crestor, etc) Would try crestor 5 mg with slow titration upwards. Will likely need 20 to 40 mg daily

## 2014-07-11 NOTE — Telephone Encounter (Signed)
Pt c/o medication issue:  1. Name of Medication: Lipitor  2. How are you currently taking this medication (dosage and times per day)? 40 mg daily   3. Are you having a reaction (difficulty breathing--STAT)? Weakness, pain in legs, having difficulty walking   4. What is your medication issue? Patient says she cannot tolerate statins and wants to switch to something else  PLEASE CALL IN NEW RX TO REPLACE STATIN TO [place]   Please call patient to discuss reaction and also replacement of statin as she said she sent a note into office and hasn't heard anything since last week.

## 2014-07-12 MED ORDER — ROSUVASTATIN CALCIUM 5 MG PO TABS: 5.0000 mg | ORAL_TABLET | Freq: Every day | ORAL | Status: DC

## 2014-07-12 NOTE — Telephone Encounter (Signed)
Spoke w/ pt.  Advised her of Dr. Donivan Scull recommendation.  She states that she has never taken Crestor before and is willing to try it.  She will call if she is unable to tolerate.

## 2014-07-12 NOTE — Telephone Encounter (Signed)
Left message for pt to call back  °

## 2014-07-13 ENCOUNTER — Other Ambulatory Visit: Payer: Self-pay | Admitting: *Deleted

## 2014-07-13 DIAGNOSIS — C73 Malignant neoplasm of thyroid gland: Secondary | ICD-10-CM

## 2014-07-17 ENCOUNTER — Telehealth: Payer: Self-pay | Admitting: *Deleted

## 2014-07-17 ENCOUNTER — Inpatient Hospital Stay: Payer: Medicare Other

## 2014-07-17 ENCOUNTER — Inpatient Hospital Stay: Payer: Medicare Other | Attending: Oncology | Admitting: Oncology

## 2014-07-17 ENCOUNTER — Other Ambulatory Visit: Payer: Self-pay | Admitting: *Deleted

## 2014-07-17 VITALS — BP 124/70 | HR 74 | Temp 97.3°F | Wt 160.5 lb

## 2014-07-17 DIAGNOSIS — I10 Essential (primary) hypertension: Secondary | ICD-10-CM | POA: Insufficient documentation

## 2014-07-17 DIAGNOSIS — E119 Type 2 diabetes mellitus without complications: Secondary | ICD-10-CM | POA: Diagnosis not present

## 2014-07-17 DIAGNOSIS — M069 Rheumatoid arthritis, unspecified: Secondary | ICD-10-CM | POA: Diagnosis not present

## 2014-07-17 DIAGNOSIS — E039 Hypothyroidism, unspecified: Secondary | ICD-10-CM | POA: Diagnosis not present

## 2014-07-17 DIAGNOSIS — C73 Malignant neoplasm of thyroid gland: Secondary | ICD-10-CM

## 2014-07-17 DIAGNOSIS — D649 Anemia, unspecified: Secondary | ICD-10-CM | POA: Insufficient documentation

## 2014-07-17 DIAGNOSIS — Z8673 Personal history of transient ischemic attack (TIA), and cerebral infarction without residual deficits: Secondary | ICD-10-CM | POA: Diagnosis not present

## 2014-07-17 DIAGNOSIS — Z79899 Other long term (current) drug therapy: Secondary | ICD-10-CM | POA: Diagnosis not present

## 2014-07-17 LAB — COMPREHENSIVE METABOLIC PANEL
ALBUMIN: 4.1 g/dL (ref 3.5–5.0)
ALK PHOS: 75 U/L (ref 38–126)
ALT: 15 U/L (ref 14–54)
ANION GAP: 7 (ref 5–15)
AST: 25 U/L (ref 15–41)
BUN: 20 mg/dL (ref 6–20)
CO2: 23 mmol/L (ref 22–32)
Calcium: 8.5 mg/dL — ABNORMAL LOW (ref 8.9–10.3)
Chloride: 107 mmol/L (ref 101–111)
Creatinine, Ser: 1 mg/dL (ref 0.44–1.00)
GFR calc Af Amer: >60 mL/min (ref >60)
GFR calc non Af Amer: 53 mL/min — ABNORMAL LOW (ref >60)
Glucose, Bld: 162 mg/dL — ABNORMAL HIGH (ref 65–99)
POTASSIUM: 4.4 mmol/L (ref 3.5–5.1)
Sodium: 137 mmol/L (ref 135–145)
Total Bilirubin: 0.5 mg/dL (ref 0.3–1.2)
Total Protein: 6.9 g/dL (ref 6.5–8.1)

## 2014-07-17 LAB — CBC WITH DIFFERENTIAL/PLATELET
BASOS ABS: 0 10*3/uL (ref 0–0.1)
BASOS PCT: 0 %
EOS ABS: 0.1 10*3/uL (ref 0–0.7)
Eosinophils Relative: 2 %
HCT: 31.1 % — ABNORMAL LOW (ref 35.0–47.0)
Hemoglobin: 10.2 g/dL — ABNORMAL LOW (ref 12.0–16.0)
Lymphocytes Relative: 9 %
Lymphs Abs: 0.5 10*3/uL — ABNORMAL LOW (ref 1.0–3.6)
MCH: 30 pg (ref 26.0–34.0)
MCHC: 32.8 g/dL (ref 32.0–36.0)
MCV: 91.5 fL (ref 80.0–100.0)
Monocytes Absolute: 0.4 10*3/uL (ref 0.2–0.9)
Monocytes Relative: 7 %
NEUTROS PCT: 82 %
Neutro Abs: 4.9 10*3/uL (ref 1.4–6.5)
PLATELETS: 173 10*3/uL (ref 150–440)
RBC: 3.4 MIL/uL — ABNORMAL LOW (ref 3.80–5.20)
RDW: 13.8 % (ref 11.5–14.5)
WBC: 5.9 10*3/uL (ref 3.6–11.0)

## 2014-07-17 LAB — TSH: TSH: 20.354 u[IU]/mL — ABNORMAL HIGH (ref 0.350–4.500)

## 2014-07-17 MED ORDER — LEVOTHYROXINE SODIUM 100 MCG PO TABS: 100.0000 ug | ORAL_TABLET | Freq: Every day | ORAL | Status: DC

## 2014-07-17 NOTE — Telephone Encounter (Signed)
Called patient and left message that MD is increasing her synthroid to 100 mcg.  A new prescription has been called to pharmacy.  Labs will be rechecked in 6 weeks.  Our scheduler will call to set up that appointment.

## 2014-07-17 NOTE — Telephone Encounter (Signed)
Levothyroxine increased to 122mcg. Will recheck labs in 6 weeks.

## 2014-07-17 NOTE — Progress Notes (Signed)
Patient does not have living will.  Never smoked. 

## 2014-07-18 LAB — T4: T4, Total: 8.5 ug/dL (ref 4.5–12.0)

## 2014-07-18 LAB — THYROGLOBULIN ANTIBODY: Thyroglobulin Antibody: <1 IU/mL (ref 0.0–0.9)

## 2014-07-21 ENCOUNTER — Encounter: Payer: Self-pay | Admitting: Oncology

## 2014-07-21 NOTE — Progress Notes (Signed)
[place] @ [facility] [phone]  Fax:([phone]) [place]: 1933-04-27  MR#: [id number]  LYY#:[id number]  Patient Care Team: Idelle Crouch, MD as PCP - General (Internal Medicine)  CHIEF COMPLAINT:  Chief Complaint  Patient presents with  . Follow-up    Oncology History   1.papillary carcinoma of thyroid (right lobe)multifocal.  0.5 cm tumor extending into perithyroid soft tissue status post total thyroidectomy.  One lymph node was negative.  No angiolymphatic invasion seen T3 N0 M0 stage IIIa disease(diagnosis in March of2016) 2.  Status post radioactive iodine therapy in May of 2016     Thyroid cancer   04/11/2014 Initial Diagnosis Thyroid cancer    Oncology Flowsheet 05/31/2014 05/31/2014 06/06/2014 06/08/2014  ALPRAZolam (XANAX) PO - - - 0.25 mg  ondansetron (ZOFRAN) IJ - - - -  ondansetron (ZOFRAN) IV 4 mg   - -    INTERVAL HISTORY:  81 year old lady has just finished radioactive iodine therapy came today for the follow-up.  He tolerated treatment very well.  Patient started on Synthroid had some swelling in the neck area which is gradually resolving. July, 2016 Patient is here for further follow-up.  Feeling better.  No chills.  No fever.  Gradually gaining strength. REVIEW OF SYSTEMS:   Gen. status: No chills and fever.  Appetite has been stable. HEENT: Swelling in the neck area has not improved Respiratory system: No cough.  No hemoptysis.  No shortness of breath at rest or exertion.  No chest pain. Cardiovascular system: No chest pain.  No palpitation.  No paroxysmal nocturnal dyspnea. Gastro intestinal system: No heartburn.  No nausea or vomiting.  No abdominal pain.  No diarrhea.  No constipation.  No rectal bleeding. Skin: No evidence of ecchymosis or rash. Neurological system: No dizziness.  No tingling.  His was.  no tingling numbness.  no focal weakness or any focal signs. Lower extremities no edema  As per HPI. Otherwise,  a complete review of systems is negatve.  PAST MEDICAL HISTORY: Past Medical History  Diagnosis Date  . Hyperlipidemia   . Hypertension   . Mild reactive airways disease   . Chronic anxiety   . Esophagitis   . Diverticulosis   . History of colon polyps   . CAD (coronary artery disease)     a. lexiscan 08/2013: nl wall motion, no ischemia, EF 50-55%; b. cardiac cath 05/31/2014: mLAD 90%, dLAD 50%, dLCx 60%, 1st Mrg 80%, pRCA 60% s/p PCI/DES, mRCA 1st lesion 70%, mRCA 2nd 95% s/p PCI/long DES to cover entire mRCA, RPLB 40%;  c. 05/2014 Staged PCI of LAD w/ 2.5 x 15 mm Xience Alpine DES.  Marland Kitchen History of echocardiogram     a. 08/2013: normal LVSF, nl RVSF, no valvular regurgitation or stenosis   . Carotid artery disease   . PONV (postoperative nausea and vomiting)   . Hypothyroidism   . Thyroid cancer     a. s/p right sided hemi-thyroidectomy; b. planning for radioactive iodine tx; c. followed by Dr. Marcelene Butte  . Type 2 diabetes mellitus   . GERD (gastroesophageal reflux disease)   . Osteoarthritis   . Rheumatoid arthritis   . Stroke     a. 05/2014 pt c/o garbled speech following cath 5/19. MRI/A 5/26 showed left caudate infarct->conservative mgmt per neuro.    PAST SURGICAL HISTORY: Past Surgical History  Procedure Laterality Date  . Lumbar laminectomy  X 6  . Meniscus repair Bilateral     "2  on the right; 1 on the left"  . Breast implant removal  06/2001  . Abdominal hysterectomy    . Rotator cuff repair      "I think 2 left, 2 right"  . Cholecystectomy    . Ganglion cyst excision Right     AC joint  . Thyroidectomy    . Coronary angioplasty with stent placement  05/31/2014; 06/06/2014    "2; 1"  . Tonsillectomy    . Appendectomy    . Carpal tunnel release      "I've have a total of 7" (06/06/2014)  . Back surgery    . Augmentation mammaplasty    . Cataract extraction w/ intraocular lens  implant, bilateral Bilateral   . Tubal ligation    . Cardiac catheterization N/A 06/06/2014     Procedure: Coronary/Graft Atherectomy;  Surgeon: Wellington Hampshire, MD;  Location: [place] [facility];  Service: Cardiovascular;  Laterality: N/A;  . Cardiac catheterization N/A 05/31/2014    Procedure: Left Heart Cath;  Surgeon: Minna Merritts, MD;  Location: [place] [facility];  Service: Cardiovascular;  Laterality: N/A;  . Cardiac catheterization N/A 05/31/2014    Procedure: Coronary Stent Intervention;  Surgeon: Wellington Hampshire, MD;  Location: [place] [facility];  Service: Cardiovascular;  Laterality: N/A;    FAMILY HISTORY Family History  Problem Relation Age of Onset  . Heart disease Sister     stent placed x 3     GYNECOLOGIC HISTORY:  No LMP recorded. Patient is postmenopausal.     ADVANCED DIRECTIVES:  Patient does have advanced health care directive  HEALTH MAINTENANCE: History  Substance Use Topics  . Smoking status: Never Smoker   . Smokeless tobacco: Never Used  . Alcohol Use: No      Allergies  Allergen Reactions  . Ciprofloxacin Diarrhea and Nausea And Vomiting  . Codeine Diarrhea and Nausea And Vomiting  . Metronidazole Diarrhea and Nausea And Vomiting  . Sulfa Antibiotics Other (See Comments)    Distress  . Bacitracin-Polymyxin B Rash  . Celecoxib Itching and Rash  . Petrolatum-Zinc Oxide Hives  . Statins Other (See Comments)    Reaction:  Muscle pain  . Vioxx [Rofecoxib] Itching and Rash    Current Outpatient Prescriptions  Medication Sig Dispense Refill  . ALPRAZolam (XANAX) 0.25 MG tablet Take 0.25 mg by mouth at bedtime as needed.     Marland Kitchen aspirin EC 81 MG tablet Take 81 mg by mouth daily.     . Blood Glucose Monitoring Suppl (FIFTY50 GLUCOSE METER 2.0) W/DEVICE KIT Use as directed (Countour Meter).    . Cholecalciferol 1000 UNITS tablet Take 1,000 Units by mouth daily.     . clopidogrel (PLAVIX) 75 MG tablet Take 1 tablet (75 mg total) by mouth daily. 30 tablet 11  . ferrous sulfate 325 (65 FE) MG tablet Take 1 tablet (325 mg total) by mouth  2 (two) times daily with a meal. 60 tablet 3  . Glucose Blood (BLOOD GLUCOSE TEST STRIPS) STRP Use once daily. Use as instructed.    . metFORMIN (GLUCOPHAGE) 500 MG tablet Take 500 mg by mouth 2 (two) times daily with a meal.    . Multiple Vitamins-Minerals (PRESERVISION AREDS 2) CAPS Take 1 capsule by mouth 2 (two) times daily.    . nitroGLYCERIN (NITROSTAT) 0.4 MG SL tablet Place 1 tablet (0.4 mg total) under the tongue every 5 (five) minutes as needed for chest pain. 25 tablet 6  . pantoprazole (PROTONIX) 40  MG tablet Take 40 mg by mouth daily.    . ramipril (ALTACE) 10 MG capsule Take 1 capsule (10 mg total) by mouth daily. 30 capsule 6  . rosuvastatin (CRESTOR) 5 MG tablet Take 1 tablet (5 mg total) by mouth daily. 90 tablet 3  . TOPROL XL 50 MG 24 hr tablet Take 1 tablet (50 mg total) by mouth daily.    . Turmeric 500 MG CAPS Take 500 mg by mouth daily.    Marland Kitchen levothyroxine (SYNTHROID) 100 MCG tablet Take 1 tablet (100 mcg total) by mouth daily before breakfast. 30 tablet 6   No current facility-administered medications for this visit.    OBJECTIVE:  Filed Vitals:   07/17/14 1129  BP: 124/70  Pulse: 74  Temp: 97.3 F (36.3 C)     Body mass index is 28.44 kg/(m^2).    ECOG FS:1 - Symptomatic but completely ambulatory  PHYSICAL EXAM: GENERAL:  Well developed, well nourished, sitting comfortably in the exam room in no acute distress. MENTAL STATUS:  Alert and oriented to person, place and time. HEAD:    Pupils equal round and reactive to light and accomodation.  No conjunctivitis or scleral icterus. ENT:  Oropharynx clear without lesion.  Tongue normal. Mucous membranes moist.  Thyroid: No enlargement RESPIRATORY:  Clear to auscultation without rales, wheezes or rhonchi. CARDIOVASCULAR:  Regular rate and rhythm without murmur, rub or gallop. BREAST:  Right breast without masses, skin changes or nipple discharge.  Left breast without masses, skin changes or nipple discharge. ABDOMEN:   Soft, non-tender, with active bowel sounds, and no hepatosplenomegaly.  No masses. BACK:  No CVA tenderness.  No tenderness on percussion of the back or rib cage. SKIN:  No rashes, ulcers or lesions. EXTREMITIES: No edema, no skin discoloration or tenderness.  No palpable cords. LYMPH NODES: No palpable cervical, supraclavicular, axillary or inguinal adenopathy  NEUROLOGICAL: Unremarkable. PSYCH:  Appropriate.   LAB RESULTS:  Appointment on 07/17/2014  Component Date Value Ref Range Status  . WBC 07/17/2014 5.9  3.6 - 11.0 K/uL Final  . RBC 07/17/2014 3.40* 3.80 - 5.20 MIL/uL Final  . Hemoglobin 07/17/2014 10.2* 12.0 - 16.0 g/dL Final  . HCT 07/17/2014 31.1* 35.0 - 47.0 % Final  . MCV 07/17/2014 91.5  80.0 - 100.0 fL Final  . MCH 07/17/2014 30.0  26.0 - 34.0 pg Final  . MCHC 07/17/2014 32.8  32.0 - 36.0 g/dL Final  . RDW 07/17/2014 13.8  11.5 - 14.5 % Final  . Platelets 07/17/2014 173  150 - 440 K/uL Final  . Neutrophils Relative % 07/17/2014 82   Final  . Neutro Abs 07/17/2014 4.9  1.4 - 6.5 K/uL Final  . Lymphocytes Relative 07/17/2014 9   Final  . Lymphs Abs 07/17/2014 0.5* 1.0 - 3.6 K/uL Final  . Monocytes Relative 07/17/2014 7   Final  . Monocytes Absolute 07/17/2014 0.4  0.2 - 0.9 K/uL Final  . Eosinophils Relative 07/17/2014 2   Final  . Eosinophils Absolute 07/17/2014 0.1  0 - 0.7 K/uL Final  . Basophils Relative 07/17/2014 0   Final  . Basophils Absolute 07/17/2014 0.0  0 - 0.1 K/uL Final  . Sodium 07/17/2014 137  135 - 145 mmol/L Final  . Potassium 07/17/2014 4.4  3.5 - 5.1 mmol/L Final  . Chloride 07/17/2014 107  101 - 111 mmol/L Final  . CO2 07/17/2014 23  22 - 32 mmol/L Final  . Glucose, Bld 07/17/2014 162* 65 - 99 mg/dL Final  .  BUN 07/17/2014 20  6 - 20 mg/dL Final  . Creatinine, Ser 07/17/2014 1.00  0.44 - 1.00 mg/dL Final  . Calcium 07/17/2014 8.5* 8.9 - 10.3 mg/dL Final  . Total Protein 07/17/2014 6.9  6.5 - 8.1 g/dL Final  . Albumin 07/17/2014 4.1  3.5 -  5.0 g/dL Final  . AST 07/17/2014 25  15 - 41 U/L Final  . ALT 07/17/2014 15  14 - 54 U/L Final  . Alkaline Phosphatase 07/17/2014 75  38 - 126 U/L Final  . Total Bilirubin 07/17/2014 0.5  0.3 - 1.2 mg/dL Final  . GFR calc non Af Amer 07/17/2014 53* >60 mL/min Final  . GFR calc Af Amer 07/17/2014 >60  >60 mL/min Final   Comment: (NOTE) The eGFR has been calculated using the CKD EPI equation. This calculation has not been validated in all clinical situations. eGFR's persistently <60 mL/min signify possible Chronic Kidney Disease.   . Anion gap 07/17/2014 7  5 - 15 Final  . T4, Total 07/17/2014 8.5  4.5 - 12.0 ug/dL Final   Comment: (NOTE) Performed At: [facility] [place], [place] [id number] Lindon Romp MD MS:[id number]   . Thyroglobulin Antibody 07/17/2014 <1.0  0.0 - 0.9 IU/mL Final   Comment: (NOTE) Thyroglobulin Antibody measured by [facility] Methodology Performed At: [facility] [place], [place] [id number] Lindon Romp MD [id number]   . TSH 07/17/2014 20.354* 0.350 - 4.500 uIU/mL Final    No results found for: LABCA2   ASSESSMENT: 81 year old patient with carcinoma of the thyroid T3 disease.  Status post total thyroidectomy Patient had a radioactive iodine therapy on May 5 Hemoglobin is 10.2 TSH is still high  MEDICAL DECISION MAKING:  Anemia will be investigated with iron iron-binding capacity.  The patient did not have colonoscopy that would be discussed. She is still high so Synthroid has been increased 212 g and will recheck T4 TSH 6 weeks Patient expressed understanding and was in agreement with this plan. She also understands that She can call clinic at any time with any questions, concerns, or complaints.    Thyroid cancer   Staging form: Thyroid - Papillary or Follicular (Under 45 years), AJCC 7th Edition     Clinical: Stage I (T3, N0, M0) - Signed by Forest Gleason, MD on 06/10/2014   Forest Gleason, MD   07/21/2014 8:55 AM

## 2014-07-23 ENCOUNTER — Other Ambulatory Visit: Payer: Self-pay | Admitting: *Deleted

## 2014-07-23 DIAGNOSIS — C73 Malignant neoplasm of thyroid gland: Secondary | ICD-10-CM

## 2014-07-25 DIAGNOSIS — I639 Cerebral infarction, unspecified: Secondary | ICD-10-CM | POA: Insufficient documentation

## 2014-07-25 DIAGNOSIS — I7090 Unspecified atherosclerosis: Secondary | ICD-10-CM | POA: Insufficient documentation

## 2014-07-27 ENCOUNTER — Other Ambulatory Visit: Payer: Self-pay

## 2014-07-27 DIAGNOSIS — E785 Hyperlipidemia, unspecified: Secondary | ICD-10-CM

## 2014-08-14 ENCOUNTER — Other Ambulatory Visit
Admission: RE | Admit: 2014-08-14 | Discharge: 2014-08-14 | Disposition: A | Discharge status: Home / Self Care | Payer: Medicare Other | Source: Ambulatory Visit | Attending: Cardiovascular Disease | Admitting: Cardiovascular Disease

## 2014-08-14 ENCOUNTER — Other Ambulatory Visit: Payer: Medicare Other

## 2014-08-14 DIAGNOSIS — E785 Hyperlipidemia, unspecified: Secondary | ICD-10-CM | POA: Diagnosis present

## 2014-08-14 LAB — HEPATIC FUNCTION PANEL
ALT: 15 U/L (ref 14–54)
AST: 25 U/L (ref 15–41)
Albumin: 3.9 g/dL (ref 3.5–5.0)
Alkaline Phosphatase: 67 U/L (ref 38–126)
BILIRUBIN TOTAL: 0.5 mg/dL (ref 0.3–1.2)
Bilirubin, Direct: <0.1 mg/dL — ABNORMAL LOW (ref 0.1–0.5)
TOTAL PROTEIN: 6.9 g/dL (ref 6.5–8.1)

## 2014-08-14 LAB — LIPID PANEL
Cholesterol: 151 mg/dL (ref 0–200)
HDL: 42 mg/dL (ref >40)
LDL Cholesterol: 67 mg/dL (ref 0–99)
Total CHOL/HDL Ratio: 3.6 RATIO
Triglycerides: 209 mg/dL — ABNORMAL HIGH (ref <150)
VLDL: 42 mg/dL — ABNORMAL HIGH (ref 0–40)

## 2014-08-16 ENCOUNTER — Telehealth: Payer: Self-pay | Admitting: *Deleted

## 2014-08-16 NOTE — Telephone Encounter (Signed)
Please call patient with lab results

## 2014-08-21 ENCOUNTER — Telehealth: Payer: Self-pay | Admitting: *Deleted

## 2014-08-21 MED ORDER — FUROSEMIDE 20 MG PO TABS: 20.0000 mg | ORAL_TABLET | ORAL | Status: DC | PRN

## 2014-08-21 MED ORDER — POTASSIUM CHLORIDE ER 10 MEQ PO TBCR: 10.0000 meq | EXTENDED_RELEASE_TABLET | ORAL | Status: DC | PRN

## 2014-08-21 NOTE — Telephone Encounter (Signed)
Spoke w/ pt.  Advised her of Dr. Donivan Scull recommendation.  She is appreciative and will call back if her sx do not improve.

## 2014-08-21 NOTE — Telephone Encounter (Signed)
Could try Lasix 20 mg daily when necessary with KCl 10 mEq Would take sparingly Decreased fluid intake, watch sodium Would call us if no improvement of her symptoms

## 2014-08-21 NOTE — Telephone Encounter (Signed)
Spoke w/ [name].  She reports b/l ankle edema for the past 3-4 weeks.  Denies change in her diet, reports that she eats out frequently, but orders grilled chicken and vegetables.  Advised her that restaurants sometimes add more salt to their foods and to try to eat fresh foods when she can.  She reports eating fresh fruits & veggies throughout the day, but does admit to drinking "several gallons of water every day". Advised her against this.  She reports a chronic dry throat, is agreeable to eating ice to relieve this.  [name] does not have a fluid pill, she would like to know if Dr. Rockey Situ will prescribe to relieve her swelling. Please advise.  Thank you.

## 2014-08-21 NOTE — Telephone Encounter (Signed)
Pt c/o swelling: STAT is pt has developed SOB within 24 hours  1. How long have you been experiencing swelling? 3/4 weeks  2. Where is the swelling located? Both feet and ankles  3.  Are you currently taking a "fluid pill"? No  4.  Are you currently SOB?no   5.  Have you traveled recently?no   Please call patient.

## 2014-08-28 ENCOUNTER — Other Ambulatory Visit: Payer: Self-pay | Admitting: Internal Medicine

## 2014-08-28 ENCOUNTER — Inpatient Hospital Stay: Payer: Medicare Other | Attending: Oncology

## 2014-08-28 DIAGNOSIS — Z1231 Encounter for screening mammogram for malignant neoplasm of breast: Secondary | ICD-10-CM

## 2014-09-05 ENCOUNTER — Ambulatory Visit (INDEPENDENT_AMBULATORY_CARE_PROVIDER_SITE_OTHER): Payer: Medicare Other | Admitting: Cardiovascular Disease

## 2014-09-05 ENCOUNTER — Encounter: Payer: Self-pay | Admitting: Cardiovascular Disease

## 2014-09-05 VITALS — BP 118/50 | HR 69 | Ht 63.0 in | Wt 157.5 lb

## 2014-09-05 DIAGNOSIS — I2510 Atherosclerotic heart disease of native coronary artery without angina pectoris: Secondary | ICD-10-CM

## 2014-09-05 DIAGNOSIS — R079 Chest pain, unspecified: Secondary | ICD-10-CM

## 2014-09-05 DIAGNOSIS — E785 Hyperlipidemia, unspecified: Secondary | ICD-10-CM | POA: Diagnosis not present

## 2014-09-05 DIAGNOSIS — I10 Essential (primary) hypertension: Secondary | ICD-10-CM | POA: Diagnosis not present

## 2014-09-05 DIAGNOSIS — C73 Malignant neoplasm of thyroid gland: Secondary | ICD-10-CM

## 2014-09-05 NOTE — Progress Notes (Signed)
Patient ID: [name], female    DOB: 1933/12/21, 81 y.o.   MRN: [id number]  HPI Comments: 81 y/o female with coronary artery disease, cardiac cath May 2016 revealing severe LAD and RCA disease,  Percutaneous intervention and drug-eluting stent placement was performed within the right coronary artery.The LAD was also felt to be amenable for PCI but it was felt that she would require a Rotablator. This was performed later at [facility] May 20 50,016  episode of garbled speech after PCI and MRI/A of the brain was performed and showed an acute, small, nonhemorrhagic infarct of the left caudate with moderate small vessel disease. She did not have any acute neurologic deficits. Her case was discussed with neurology. Carotid ultrasound was performed and showed bilateral 1-39% internal carotid artery stenosis.   Thyroid nodule found, status post ablation, currently on thyroid supplementation This was performed March 2016  In follow-up today, she is not exercising, has not regained her strength Sedentary at baseline Denies having any significant chest pain She does have some shortness of breath with exertion  EKG on today's visit shows normal sinus rhythm with no significant ST or T-wave changes   Allergies  Allergen Reactions  . Ciprofloxacin Diarrhea and Nausea And Vomiting  . Codeine Diarrhea and Nausea And Vomiting  . Metronidazole Diarrhea and Nausea And Vomiting  . Sulfa Antibiotics Other (See Comments)    Distress  . Bacitracin-Polymyxin B Rash  . Celecoxib Itching and Rash  . Petrolatum-Zinc Oxide Hives  . Statins Other (See Comments)    Reaction:  Muscle pain  . Vioxx [Rofecoxib] Itching and Rash    Current Outpatient Prescriptions on File Prior to Visit  Medication Sig Dispense Refill  . ALPRAZolam (XANAX) 0.25 MG tablet Take 0.25 mg by mouth at bedtime as needed.     Marland Kitchen aspirin EC 81 MG tablet Take 81 mg by mouth daily.     . Blood Glucose Monitoring Suppl (FIFTY50  GLUCOSE METER 2.0) W/DEVICE KIT Use as directed (Countour Meter).    . Cholecalciferol 1000 UNITS tablet Take 1,000 Units by mouth daily.     . clopidogrel (PLAVIX) 75 MG tablet Take 1 tablet (75 mg total) by mouth daily. 30 tablet 11  . ferrous sulfate 325 (65 FE) MG tablet Take 1 tablet (325 mg total) by mouth 2 (two) times daily with a meal. 60 tablet 3  . furosemide (LASIX) 20 MG tablet Take 1 tablet (20 mg total) by mouth as needed for edema. 30 tablet 3  . Glucose Blood (BLOOD GLUCOSE TEST STRIPS) STRP Use once daily. Use as instructed.    Marland Kitchen levothyroxine (SYNTHROID) 100 MCG tablet Take 1 tablet (100 mcg total) by mouth daily before breakfast. 30 tablet 6  . metFORMIN (GLUCOPHAGE) 500 MG tablet Take 500 mg by mouth 2 (two) times daily with a meal.    . Multiple Vitamins-Minerals (PRESERVISION AREDS 2) CAPS Take 1 capsule by mouth 2 (two) times daily.    . nitroGLYCERIN (NITROSTAT) 0.4 MG SL tablet Place 1 tablet (0.4 mg total) under the tongue every 5 (five) minutes as needed for chest pain. 25 tablet 6  . pantoprazole (PROTONIX) 40 MG tablet Take 40 mg by mouth daily.    . potassium chloride (K-DUR) 10 MEQ tablet Take 1 tablet (10 mEq total) by mouth as needed (take with lasix). 30 tablet 3  . ramipril (ALTACE) 10 MG capsule Take 1 capsule (10 mg total) by mouth daily. 30 capsule 6  . rosuvastatin (  CRESTOR) 5 MG tablet Take 1 tablet (5 mg total) by mouth daily. 90 tablet 3  . TOPROL XL 50 MG 24 hr tablet Take 1 tablet (50 mg total) by mouth daily.    . Turmeric 500 MG CAPS Take 500 mg by mouth daily.     No current facility-administered medications on file prior to visit.    Past Medical History  Diagnosis Date  . Hyperlipidemia   . Hypertension   . Mild reactive airways disease   . Chronic anxiety   . Esophagitis   . Diverticulosis   . History of colon polyps   . CAD (coronary artery disease)     a. lexiscan 08/2013: nl wall motion, no ischemia, EF 50-55%; b. cardiac cath  05/31/2014: mLAD 90%, dLAD 50%, dLCx 60%, 1st Mrg 80%, pRCA 60% s/p PCI/DES, mRCA 1st lesion 70%, mRCA 2nd 95% s/p PCI/long DES to cover entire mRCA, RPLB 40%;  c. 05/2014 Staged PCI of LAD w/ 2.5 x 15 mm Xience Alpine DES.  Marland Kitchen History of echocardiogram     a. 08/2013: normal LVSF, nl RVSF, no valvular regurgitation or stenosis   . Carotid artery disease   . PONV (postoperative nausea and vomiting)   . Hypothyroidism   . Thyroid cancer     a. s/p right sided hemi-thyroidectomy; b. planning for radioactive iodine tx; c. followed by Dr. Marcelene Butte  . Type 2 diabetes mellitus   . GERD (gastroesophageal reflux disease)   . Osteoarthritis   . Rheumatoid arthritis   . Stroke     a. 05/2014 pt c/o garbled speech following cath 5/19. MRI/A 5/26 showed left caudate infarct->conservative mgmt per neuro.    Past Surgical History  Procedure Laterality Date  . Lumbar laminectomy  X 6  . Meniscus repair Bilateral     "2 on the right; 1 on the left"  . Breast implant removal  06/2001  . Abdominal hysterectomy    . Rotator cuff repair      "I think 2 left, 2 right"  . Cholecystectomy    . Ganglion cyst excision Right     AC joint  . Thyroidectomy    . Tonsillectomy    . Appendectomy    . Carpal tunnel release      "I've have a total of 7" (06/06/2014)  . Back surgery    . Augmentation mammaplasty    . Cataract extraction w/ intraocular lens  implant, bilateral Bilateral   . Tubal ligation    . Coronary angioplasty with stent placement  05/31/2014; 06/06/2014    "2; 1"  . Cardiac catheterization N/A 06/06/2014    Procedure: Coronary/Graft Atherectomy;  Surgeon: Wellington Hampshire, MD;  Location: [place] [facility];  Service: Cardiovascular;  Laterality: N/A;  . Cardiac catheterization N/A 05/31/2014    Procedure: Left Heart Cath;  Surgeon: Minna Merritts, MD;  Location: [place] [facility];  Service: Cardiovascular;  Laterality: N/A;  . Cardiac catheterization N/A 05/31/2014    Procedure: Coronary Stent  Intervention;  Surgeon: Wellington Hampshire, MD;  Location: [place] [facility];  Service: Cardiovascular;  Laterality: N/A;    Social History  reports that she has never smoked. She has never used smokeless tobacco. She reports that she does not drink alcohol or use illicit drugs.  Family History family history includes Heart disease in her sister.   Review of Systems  Constitutional: Positive for fatigue.  HENT: Negative.   Respiratory: Negative.   Cardiovascular: Negative.   Gastrointestinal:  Negative.   Musculoskeletal: Negative.   Neurological: Negative.   Hematological: Negative.   Psychiatric/Behavioral: Negative.   All other systems reviewed and are negative.   BP 118/50 mmHg  Pulse 69  Ht '5\' 3"'  (1.6 m)  Wt 157 lb 8 oz (71.442 kg)  BMI 27.91 kg/m2  Physical Exam  Constitutional: She is oriented to person, place, and time. She appears well-developed and well-nourished.  HENT:  Head: Normocephalic.  Nose: Nose normal.  Mouth/Throat: Oropharynx is clear and moist.  Eyes: Conjunctivae are normal. Pupils are equal, round, and reactive to light.  Neck: Normal range of motion. Neck supple. No JVD present.  Cardiovascular: Normal rate, regular rhythm, normal heart sounds and intact distal pulses.  Exam reveals no gallop and no friction rub.   No murmur heard. Pulmonary/Chest: Effort normal and breath sounds normal. No respiratory distress. She has no wheezes. She has no rales. She exhibits no tenderness.  Abdominal: Soft. Bowel sounds are normal. She exhibits no distension. There is no tenderness.  Musculoskeletal: Normal range of motion. She exhibits no edema or tenderness.  Lymphadenopathy:    She has no cervical adenopathy.  Neurological: She is alert and oriented to person, place, and time. Coordination normal.  Skin: Skin is warm and dry. No rash noted. No erythema.  Psychiatric: She has a normal mood and affect. Her behavior is normal. Judgment and thought content  normal.

## 2014-09-05 NOTE — Patient Instructions (Signed)
You are doing well. No medication changes were made.  Please call us if you have new issues that need to be addressed before your next appt.  Your physician wants you to follow-up in: 6 months.  You will receive a reminder letter in the mail two months in advance. If you don't receive a letter, please call our office to schedule the follow-up appointment.   

## 2014-09-05 NOTE — Assessment & Plan Note (Signed)
Blood pressure is well controlled on today's visit. No changes made to the medications. 

## 2014-09-05 NOTE — Assessment & Plan Note (Signed)
Status post treatment with iodine ablation, followed by oncology. March 2016

## 2014-09-05 NOTE — Assessment & Plan Note (Signed)
Cholesterol is at goal on the current lipid regimen. No changes to the medications were made.  

## 2014-09-05 NOTE — Assessment & Plan Note (Signed)
Currently with no symptoms of angina. No further workup at this time. Continue current medication regimen. 

## 2014-10-08 ENCOUNTER — Ambulatory Visit
Admission: RE | Admit: 2014-10-08 | Discharge: 2014-10-08 | Disposition: A | Discharge status: Home / Self Care | Payer: Medicare Other | Source: Ambulatory Visit | Attending: Internal Medicine | Admitting: Internal Medicine

## 2014-10-08 DIAGNOSIS — Z1231 Encounter for screening mammogram for malignant neoplasm of breast: Secondary | ICD-10-CM

## 2014-10-12 ENCOUNTER — Other Ambulatory Visit: Payer: Self-pay | Admitting: Internal Medicine

## 2014-10-12 DIAGNOSIS — R928 Other abnormal and inconclusive findings on diagnostic imaging of breast: Secondary | ICD-10-CM

## 2014-10-23 ENCOUNTER — Ambulatory Visit
Admission: RE | Admit: 2014-10-23 | Discharge: 2014-10-23 | Disposition: A | Discharge status: Home / Self Care | Payer: Medicare Other | Source: Ambulatory Visit | Attending: Internal Medicine | Admitting: Internal Medicine

## 2014-10-23 DIAGNOSIS — R928 Other abnormal and inconclusive findings on diagnostic imaging of breast: Secondary | ICD-10-CM

## 2014-11-01 ENCOUNTER — Other Ambulatory Visit: Payer: Self-pay | Admitting: Internal Medicine

## 2014-11-01 ENCOUNTER — Telehealth: Payer: Self-pay | Admitting: *Deleted

## 2014-11-01 DIAGNOSIS — N63 Unspecified lump in unspecified breast: Secondary | ICD-10-CM

## 2014-11-01 NOTE — Telephone Encounter (Signed)
Pt called to get results of mammogram mailed to her. Also, pt wants to know Dr. Metro Kung recommendations regarding results from mammogram. Informed pt will mail results to her and ask Dr. Oliva Bustard his opinion regarding results and will call pt back on Thursday or Friday. Pt verbalized understanding.

## 2014-11-02 NOTE — Telephone Encounter (Signed)
Pt notified of MD recommendations. Pt verbalized understanding

## 2014-11-02 NOTE — Telephone Encounter (Signed)
MD has reviewed mammogram results and is in agreement with radiologist recommendations.

## 2014-11-22 ENCOUNTER — Inpatient Hospital Stay: Payer: Medicare Other | Attending: Oncology

## 2014-11-22 ENCOUNTER — Inpatient Hospital Stay (HOSPITAL_BASED_OUTPATIENT_CLINIC_OR_DEPARTMENT_OTHER): Payer: Medicare Other | Admitting: Oncology

## 2014-11-22 ENCOUNTER — Encounter: Payer: Self-pay | Admitting: Oncology

## 2014-11-22 VITALS — BP 153/70 | HR 68 | Temp 97.6°F | Wt 160.5 lb

## 2014-11-22 DIAGNOSIS — Z7982 Long term (current) use of aspirin: Secondary | ICD-10-CM | POA: Diagnosis not present

## 2014-11-22 DIAGNOSIS — Z86010 Personal history of colonic polyps: Secondary | ICD-10-CM | POA: Insufficient documentation

## 2014-11-22 DIAGNOSIS — I10 Essential (primary) hypertension: Secondary | ICD-10-CM | POA: Diagnosis not present

## 2014-11-22 DIAGNOSIS — C73 Malignant neoplasm of thyroid gland: Secondary | ICD-10-CM

## 2014-11-22 DIAGNOSIS — Z79899 Other long term (current) drug therapy: Secondary | ICD-10-CM | POA: Insufficient documentation

## 2014-11-22 DIAGNOSIS — E785 Hyperlipidemia, unspecified: Secondary | ICD-10-CM | POA: Diagnosis not present

## 2014-11-22 DIAGNOSIS — I2510 Atherosclerotic heart disease of native coronary artery without angina pectoris: Secondary | ICD-10-CM | POA: Insufficient documentation

## 2014-11-22 DIAGNOSIS — D62 Acute posthemorrhagic anemia: Secondary | ICD-10-CM

## 2014-11-22 DIAGNOSIS — Z7984 Long term (current) use of oral hypoglycemic drugs: Secondary | ICD-10-CM | POA: Insufficient documentation

## 2014-11-22 DIAGNOSIS — Z8673 Personal history of transient ischemic attack (TIA), and cerebral infarction without residual deficits: Secondary | ICD-10-CM | POA: Diagnosis not present

## 2014-11-22 DIAGNOSIS — E119 Type 2 diabetes mellitus without complications: Secondary | ICD-10-CM | POA: Diagnosis not present

## 2014-11-22 DIAGNOSIS — K219 Gastro-esophageal reflux disease without esophagitis: Secondary | ICD-10-CM | POA: Diagnosis not present

## 2014-11-22 DIAGNOSIS — D649 Anemia, unspecified: Secondary | ICD-10-CM | POA: Diagnosis not present

## 2014-11-22 DIAGNOSIS — M069 Rheumatoid arthritis, unspecified: Secondary | ICD-10-CM | POA: Insufficient documentation

## 2014-11-22 DIAGNOSIS — F419 Anxiety disorder, unspecified: Secondary | ICD-10-CM | POA: Insufficient documentation

## 2014-11-22 LAB — COMPREHENSIVE METABOLIC PANEL
ALK PHOS: 60 U/L (ref 38–126)
ALT: 13 U/L — AB (ref 14–54)
AST: 21 U/L (ref 15–41)
Albumin: 4 g/dL (ref 3.5–5.0)
Anion gap: 8 (ref 5–15)
BILIRUBIN TOTAL: 0.4 mg/dL (ref 0.3–1.2)
BUN: 25 mg/dL — ABNORMAL HIGH (ref 6–20)
CALCIUM: 8.9 mg/dL (ref 8.9–10.3)
CO2: 24 mmol/L (ref 22–32)
Chloride: 105 mmol/L (ref 101–111)
Creatinine, Ser: 0.88 mg/dL (ref 0.44–1.00)
Glucose, Bld: 97 mg/dL (ref 65–99)
Potassium: 4.5 mmol/L (ref 3.5–5.1)
Sodium: 137 mmol/L (ref 135–145)
Total Protein: 6.7 g/dL (ref 6.5–8.1)

## 2014-11-22 LAB — CBC WITH DIFFERENTIAL/PLATELET
BASOS ABS: 0 10*3/uL (ref 0–0.1)
Basophils Relative: 1 %
Eosinophils Absolute: 0.2 10*3/uL (ref 0–0.7)
Eosinophils Relative: 2 %
HEMATOCRIT: 31.3 % — AB (ref 35.0–47.0)
HEMOGLOBIN: 10.4 g/dL — AB (ref 12.0–16.0)
LYMPHS ABS: 1 10*3/uL (ref 1.0–3.6)
LYMPHS PCT: 14 %
MCH: 28.7 pg (ref 26.0–34.0)
MCHC: 33.3 g/dL (ref 32.0–36.0)
MCV: 86.2 fL (ref 80.0–100.0)
Monocytes Absolute: 0.5 10*3/uL (ref 0.2–0.9)
Monocytes Relative: 7 %
NEUTROS ABS: 5.4 10*3/uL (ref 1.4–6.5)
NEUTROS PCT: 76 %
Platelets: 195 10*3/uL (ref 150–440)
RBC: 3.63 MIL/uL — AB (ref 3.80–5.20)
RDW: 14.3 % (ref 11.5–14.5)
WBC: 7.1 10*3/uL (ref 3.6–11.0)

## 2014-11-22 LAB — TSH: TSH: 2.006 u[IU]/mL (ref 0.350–4.500)

## 2014-11-22 MED ORDER — FERROUS FUMARATE 325 (106 FE) MG PO TABS: 1.0000 | ORAL_TABLET | Freq: Every day | ORAL | Status: DC

## 2014-11-22 NOTE — Progress Notes (Signed)
[place] @ [facility] [phone]  Fax:([phone] [place]: 12/27/1933  MR#: [id number]  GBT#:[id number]  Patient Care Team: Idelle Crouch, MD as PCP - General (Internal Medicine)  CHIEF COMPLAINT:  Chief Complaint  Patient presents with  . OTHER    Oncology History   1.papillary carcinoma of thyroid (right lobe)multifocal.  0.5 cm tumor extending into perithyroid soft tissue status post total thyroidectomy.  One lymph node was negative.  No angiolymphatic invasion seen T3 N0 M0 stage IIIa disease(diagnosis in March of2016) 2.  Status post radioactive iodine therapy in May of 2016     Thyroid cancer ([place])   04/11/2014 Initial Diagnosis Thyroid cancer    Oncology Flowsheet 05/31/2014 05/31/2014 06/06/2014 06/08/2014  ALPRAZolam (XANAX) PO - - - 0.25 mg  ondansetron (ZOFRAN) IJ - - - -  ondansetron (ZOFRAN) IV 4 mg   - -    INTERVAL HISTORY:  81 year old lady has just finished radioactive iodine therapy came today for the follow-up.  He tolerated treatment very well.  Patient started on Synthroid had some swelling in the neck area which is gradually resolving. Patient is feeling stronger.Marland Kitchen No chills and fever.  Appetite is improving. REVIEW OF SYSTEMS:   Gen. status: No chills and fever.  Appetite has been stable. HEENT: Swelling in the neck area has not improved Respiratory system: No cough.  No hemoptysis.  No shortness of breath at rest or exertion.  No chest pain. Cardiovascular system: No chest pain.  No palpitation.  No paroxysmal nocturnal dyspnea. Gastro intestinal system: No heartburn.  No nausea or vomiting.  No abdominal pain.  No diarrhea.  No constipation.  No rectal bleeding. Skin: No evidence of ecchymosis or rash. Neurological system: No dizziness.  No tingling.  His was.  no tingling numbness.  no focal weakness or any focal signs. Lower extremities no edema  As per HPI. Otherwise, a complete review of systems is  negatve.  PAST MEDICAL HISTORY: Past Medical History  Diagnosis Date  . Hyperlipidemia   . Hypertension   . Mild reactive airways disease   . Chronic anxiety   . Esophagitis   . Diverticulosis   . History of colon polyps   . CAD (coronary artery disease)     a. lexiscan 08/2013: nl wall motion, no ischemia, EF 50-55%; b. cardiac cath 05/31/2014: mLAD 90%, dLAD 50%, dLCx 60%, 1st Mrg 80%, pRCA 60% s/p PCI/DES, mRCA 1st lesion 70%, mRCA 2nd 95% s/p PCI/long DES to cover entire mRCA, RPLB 40%;  c. 05/2014 Staged PCI of LAD w/ 2.5 x 15 mm Xience Alpine DES.  Marland Kitchen History of echocardiogram     a. 08/2013: normal LVSF, nl RVSF, no valvular regurgitation or stenosis   . Carotid artery disease ([place])   . PONV (postoperative nausea and vomiting)   . Hypothyroidism   . Thyroid cancer ([place])     a. s/p right sided hemi-thyroidectomy; b. planning for radioactive iodine tx; c. followed by Dr. Marcelene Butte  . Type 2 diabetes mellitus ([place])   . GERD (gastroesophageal reflux disease)   . Osteoarthritis   . Rheumatoid arthritis ([place])   . Stroke [facility])     a. 05/2014 pt c/o garbled speech following cath 5/19. MRI/A 5/26 showed left caudate infarct->conservative mgmt per neuro.    PAST SURGICAL HISTORY: Past Surgical History  Procedure Laterality Date  . Lumbar laminectomy  X 6  . Meniscus repair Bilateral     "2 on the right;  1 on the left"  . Breast implant removal  06/2001  . Abdominal hysterectomy    . Rotator cuff repair      "I think 2 left, 2 right"  . Cholecystectomy    . Ganglion cyst excision Right     AC joint  . Thyroidectomy    . Tonsillectomy    . Appendectomy    . Carpal tunnel release      "I've have a total of 7" (06/06/2014)  . Back surgery    . Augmentation mammaplasty    . Cataract extraction w/ intraocular lens  implant, bilateral Bilateral   . Tubal ligation    . Coronary angioplasty with stent placement  05/31/2014; 06/06/2014    "2; 1"  . Cardiac catheterization N/A 06/06/2014     Procedure: Coronary/Graft Atherectomy;  Surgeon: Wellington Hampshire, MD;  Location: [place] [facility];  Service: Cardiovascular;  Laterality: N/A;  . Cardiac catheterization N/A 05/31/2014    Procedure: Left Heart Cath;  Surgeon: Minna Merritts, MD;  Location: [place] [facility];  Service: Cardiovascular;  Laterality: N/A;  . Cardiac catheterization N/A 05/31/2014    Procedure: Coronary Stent Intervention;  Surgeon: Wellington Hampshire, MD;  Location: [place] [facility];  Service: Cardiovascular;  Laterality: N/A;    FAMILY HISTORY Family History  Problem Relation Age of Onset  . Heart disease Sister     stent placed x 3     GYNECOLOGIC HISTORY:  No LMP recorded. Patient is postmenopausal.     ADVANCED DIRECTIVES:  Patient does have advanced health care directive  HEALTH MAINTENANCE: Social History  Substance Use Topics  . Smoking status: Never Smoker   . Smokeless tobacco: Never Used  . Alcohol Use: No      Allergies  Allergen Reactions  . Ciprofloxacin Diarrhea and Nausea And Vomiting  . Codeine Diarrhea and Nausea And Vomiting  . Metronidazole Diarrhea, Nausea And Vomiting and Nausea Only  . Sulfa Antibiotics Other (See Comments)    Distress  . Neomycin-Bacitracin Zn-Polymyx Other (See Comments)    Reaction:  Unknown   . Bacitracin-Polymyxin B Rash  . Celecoxib Itching, Rash and Other (See Comments)  . Petrolatum-Zinc Oxide Hives  . Statins Other (See Comments)    Reaction:  Muscle pain  . Vioxx [Rofecoxib] Itching and Rash    Current Outpatient Prescriptions  Medication Sig Dispense Refill  . ALPRAZolam (XANAX) 0.25 MG tablet Take 0.25 mg by mouth at bedtime as needed.     Marland Kitchen aspirin EC 81 MG tablet Take 81 mg by mouth daily.     . Blood Glucose Monitoring Suppl (FIFTY50 GLUCOSE METER 2.0) W/DEVICE KIT Use as directed (Countour Meter).    . Cholecalciferol 1000 UNITS tablet Take 1,000 Units by mouth daily.     . clopidogrel (PLAVIX) 75 MG tablet Take 1 tablet  (75 mg total) by mouth daily. 30 tablet 11  . furosemide (LASIX) 20 MG tablet Take 1 tablet (20 mg total) by mouth as needed for edema. 30 tablet 3  . Glucose Blood (BLOOD GLUCOSE TEST STRIPS) STRP Use once daily. Use as instructed.    Marland Kitchen levothyroxine (SYNTHROID) 100 MCG tablet Take 1 tablet (100 mcg total) by mouth daily before breakfast. 30 tablet 6  . metFORMIN (GLUCOPHAGE) 500 MG tablet Take 500 mg by mouth 2 (two) times daily with a meal.    . nitroGLYCERIN (NITROSTAT) 0.4 MG SL tablet Place 1 tablet (0.4 mg total) under the tongue every 5 (five)  minutes as needed for chest pain. 25 tablet 6  . pantoprazole (PROTONIX) 40 MG tablet Take 40 mg by mouth daily.    . potassium chloride (K-DUR) 10 MEQ tablet Take 1 tablet (10 mEq total) by mouth as needed (take with lasix). 30 tablet 3  . ramipril (ALTACE) 10 MG capsule Take 1 capsule (10 mg total) by mouth daily. 30 capsule 6  . rosuvastatin (CRESTOR) 5 MG tablet Take 1 tablet (5 mg total) by mouth daily. 90 tablet 3  . TOPROL XL 50 MG 24 hr tablet Take 1 tablet (50 mg total) by mouth daily.    . Turmeric 500 MG CAPS Take 500 mg by mouth daily.     No current facility-administered medications for this visit.    OBJECTIVE:  Filed Vitals:   11/22/14 1509  BP: 153/70  Pulse: 68  Temp: 97.6 F (36.4 C)     Body mass index is 28.44 kg/(m^2).    ECOG FS:1 - Symptomatic but completely ambulatory  PHYSICAL EXAM: GENERAL:  Well developed, well nourished, sitting comfortably in the exam room in no acute distress. MENTAL STATUS:  Alert and oriented to person, place and time. HEAD:    Pupils equal round and reactive to light and accomodation.  No conjunctivitis or scleral icterus. ENT:  Oropharynx clear without lesion.  Tongue normal. Mucous membranes moist.  Thyroid: No enlargement RESPIRATORY:  Clear to auscultation without rales, wheezes or rhonchi. CARDIOVASCULAR:  Regular rate and rhythm without murmur, rub or gallop. BREAST:  Right breast  without masses, skin changes or nipple discharge.  Left breast without masses, skin changes or nipple discharge. ABDOMEN:  Soft, non-tender, with active bowel sounds, and no hepatosplenomegaly.  No masses. BACK:  No CVA tenderness.  No tenderness on percussion of the back or rib cage. SKIN:  No rashes, ulcers or lesions. EXTREMITIES: No edema, no skin discoloration or tenderness.  No palpable cords. LYMPH NODES: No palpable cervical, supraclavicular, axillary or inguinal adenopathy  NEUROLOGICAL: Unremarkable. PSYCH:  Appropriate.   LAB RESULTS:  Appointment on 11/22/2014  Component Date Value Ref Range Status  . WBC 11/22/2014 7.1  3.6 - 11.0 K/uL Final  . RBC 11/22/2014 3.63* 3.80 - 5.20 MIL/uL Final  . Hemoglobin 11/22/2014 10.4* 12.0 - 16.0 g/dL Final  . HCT 11/22/2014 31.3* 35.0 - 47.0 % Final  . MCV 11/22/2014 86.2  80.0 - 100.0 fL Final  . MCH 11/22/2014 28.7  26.0 - 34.0 pg Final  . MCHC 11/22/2014 33.3  32.0 - 36.0 g/dL Final  . RDW 11/22/2014 14.3  11.5 - 14.5 % Final  . Platelets 11/22/2014 195  150 - 440 K/uL Final  . Neutrophils Relative % 11/22/2014 76   Final  . Neutro Abs 11/22/2014 5.4  1.4 - 6.5 K/uL Final  . Lymphocytes Relative 11/22/2014 14   Final  . Lymphs Abs 11/22/2014 1.0  1.0 - 3.6 K/uL Final  . Monocytes Relative 11/22/2014 7   Final  . Monocytes Absolute 11/22/2014 0.5  0.2 - 0.9 K/uL Final  . Eosinophils Relative 11/22/2014 2   Final  . Eosinophils Absolute 11/22/2014 0.2  0 - 0.7 K/uL Final  . Basophils Relative 11/22/2014 1   Final  . Basophils Absolute 11/22/2014 0.0  0 - 0.1 K/uL Final  . Sodium 11/22/2014 137  135 - 145 mmol/L Final  . Potassium 11/22/2014 4.5  3.5 - 5.1 mmol/L Final  . Chloride 11/22/2014 105  101 - 111 mmol/L Final  . CO2 11/22/2014 24  22 - 32 mmol/L Final  . Glucose, Bld 11/22/2014 97  65 - 99 mg/dL Final  . BUN 11/22/2014 25* 6 - 20 mg/dL Final  . Creatinine, Ser 11/22/2014 0.88  0.44 - 1.00 mg/dL Final  . Calcium  11/22/2014 8.9  8.9 - 10.3 mg/dL Final  . Total Protein 11/22/2014 6.7  6.5 - 8.1 g/dL Final  . Albumin 11/22/2014 4.0  3.5 - 5.0 g/dL Final  . AST 11/22/2014 21  15 - 41 U/L Final  . ALT 11/22/2014 13* 14 - 54 U/L Final  . Alkaline Phosphatase 11/22/2014 60  38 - 126 U/L Final  . Total Bilirubin 11/22/2014 0.4  0.3 - 1.2 mg/dL Final  . GFR calc non Af Amer 11/22/2014 >60  >60 mL/min Final  . GFR calc Af Amer 11/22/2014 >60  >60 mL/min Final   Comment: (NOTE) The eGFR has been calculated using the CKD EPI equation. This calculation has not been validated in all clinical situations. eGFR's persistently <60 mL/min signify possible Chronic Kidney Disease.   . Anion gap 11/22/2014 8  5 - 15 Final    No results found for: LABCA2   ASSESSMENT: 81 year old patient with carcinoma of the thyroid T3 disease.  Status post total thyroidectomy Patient had a radioactive iodine therapy on May 5 Hemoglobin is 10.2 TSH is 2.0   T4  Is PENDING . Anemia still persistent patient was advised to get upper and lower endoscopy done   MEDICAL DECISION MAKING:  Continue same dose of Synthroid All lab data has been reviewed. GI workup has been recommended    Thyroid cancer   Staging form: Thyroid - Papillary or Follicular (Under 45 years), AJCC 7th Edition     Clinical: Stage I (T3, N0, M0) - Signed by Forest Gleason, MD on 06/10/2014   Forest Gleason, MD   11/22/2014 3:16 PM

## 2014-11-22 NOTE — Progress Notes (Signed)
Patient states the glands in her neck stay sore.

## 2014-11-27 LAB — THYROGLOBULIN LEVEL

## 2014-11-27 LAB — T4: T4 TOTAL: 10 ug/dL (ref 4.5–12.0)

## 2014-12-12 ENCOUNTER — Other Ambulatory Visit: Payer: Self-pay | Admitting: Cardiovascular Disease

## 2014-12-23 ENCOUNTER — Other Ambulatory Visit: Payer: Self-pay | Admitting: Cardiovascular Disease

## 2015-01-02 ENCOUNTER — Telehealth: Payer: Self-pay | Admitting: *Deleted

## 2015-01-02 NOTE — Telephone Encounter (Signed)
Pt c/o swelling: STAT is pt has developed SOB within 24 hours  1. How long have you been experiencing swelling? 4 days  2. Where is the swelling located? Right and left foot  But right foot is worse  3.  Are you currently taking a "fluid pill"? yes  4.  Are you currently SOB? no  5.  Have you traveled recently? No   Please call after 3 pm she has other appointments. Thanks!

## 2015-01-02 NOTE — Telephone Encounter (Signed)
Spoke w/ pt.  She reports swelling in her right foot since Saturday, left foot started on Sunday; Rt foot is worse. She is unsure if swelling is r/t her heart or something else. Pt denies wt gain, increased sodium or fluid intake. Denies injury and "nothing bit me". Reports she took a lasix yesterday and has not noticed a difference.  Pt does not have compression hose, as she does not like them and cannot put them on. Pt does not elevate feet during the day, but does take a nap during the day when she lies flat. Advised pt to take a lasix today, elevate her feet and continue to avoid salt and fluids. Advised her to monitor sx and call back if swelling does not improve. She is agreeable and will call back if we can be of further assistance.

## 2015-01-10 ENCOUNTER — Other Ambulatory Visit: Payer: Self-pay | Admitting: Nurse Practitioner

## 2015-02-05 ENCOUNTER — Other Ambulatory Visit: Payer: Self-pay | Admitting: Oncology

## 2015-02-08 ENCOUNTER — Ambulatory Visit (INDEPENDENT_AMBULATORY_CARE_PROVIDER_SITE_OTHER): Payer: 59 | Admitting: Cardiovascular Disease

## 2015-02-08 ENCOUNTER — Encounter: Payer: Self-pay | Admitting: Cardiovascular Disease

## 2015-02-08 VITALS — BP 124/54 | HR 79 | Ht 63.0 in | Wt 153.5 lb

## 2015-02-08 DIAGNOSIS — I779 Disorder of arteries and arterioles, unspecified: Secondary | ICD-10-CM

## 2015-02-08 DIAGNOSIS — R079 Chest pain, unspecified: Secondary | ICD-10-CM

## 2015-02-08 DIAGNOSIS — I2510 Atherosclerotic heart disease of native coronary artery without angina pectoris: Secondary | ICD-10-CM

## 2015-02-08 DIAGNOSIS — I209 Angina pectoris, unspecified: Secondary | ICD-10-CM

## 2015-02-08 DIAGNOSIS — E119 Type 2 diabetes mellitus without complications: Secondary | ICD-10-CM

## 2015-02-08 DIAGNOSIS — I739 Peripheral vascular disease, unspecified: Secondary | ICD-10-CM

## 2015-02-08 DIAGNOSIS — E785 Hyperlipidemia, unspecified: Secondary | ICD-10-CM

## 2015-02-08 DIAGNOSIS — R600 Localized edema: Secondary | ICD-10-CM | POA: Diagnosis not present

## 2015-02-08 DIAGNOSIS — R0602 Shortness of breath: Secondary | ICD-10-CM

## 2015-02-08 MED ORDER — ROSUVASTATIN CALCIUM 5 MG PO TABS: 5.0000 mg | ORAL_TABLET | Freq: Every day | ORAL | Status: DC

## 2015-02-08 MED ORDER — NITROGLYCERIN 0.4 MG SL SUBL
SUBLINGUAL_TABLET | SUBLINGUAL | Status: DC
Start: 2015-02-08 — End: 2015-09-26

## 2015-02-08 MED ORDER — CLOPIDOGREL BISULFATE 75 MG PO TABS: 75.0000 mg | ORAL_TABLET | Freq: Every day | ORAL | Status: DC

## 2015-02-08 MED ORDER — TOPROL XL 50 MG PO TB24: 50.0000 mg | ORAL_TABLET | Freq: Every day | ORAL | Status: DC

## 2015-02-08 NOTE — Patient Instructions (Addendum)
You are doing well. No medication changes were made.  We will renew all you cardiac meds, 90 days with refills (no lasix or potassium)  Please call us if you have new issues that need to be addressed before your next appt.  Your physician wants you to follow-up in: 6 months.  You will receive a reminder letter in the mail two months in advance. If you don't receive a letter, please call our office to schedule the follow-up appointment.

## 2015-02-08 NOTE — Assessment & Plan Note (Signed)
Less than 39% bilateral carotid disease

## 2015-02-08 NOTE — Assessment & Plan Note (Signed)
Cholesterol is at goal on the current lipid regimen. No changes to the medications were made.  

## 2015-02-08 NOTE — Assessment & Plan Note (Signed)
Likely component of acute on chronic diastolic CHF, appears euvolemic on today's visit. Very deconditioned, no regular exercise Recommended a regular walking program with her husband

## 2015-02-08 NOTE — Progress Notes (Signed)
Patient ID: [name], female    DOB: 1933-11-22, 82 y.o.   MRN: [id number]  HPI Comments: 82 y/o female with coronary artery disease, cardiac cath May 2016 revealing severe LAD and RCA disease,  Percutaneous intervention and drug-eluting stent placement was performed within the right coronary artery.The LAD was also felt to be amenable for PCI but it was felt that she would require a Rotablator. This was performed later at [facility] May 20 50,016  episode of garbled speech after PCI and MRI/A of the brain was performed and showed an acute, small, nonhemorrhagic infarct of the left caudate with moderate small vessel disease. She did not have any acute neurologic deficits.   Carotid ultrasound was performed and showed bilateral 1-39% internal carotid artery stenosis.   In follow-up today, she reports that she has had some recent leg swelling, has been taking Lasix when necessary with potassium with improvement of her symptoms.  She eats out every day, 3 meals per day, drinks significant amount of water. She watches her weight daily and takes Lasix for any increase in her weight No regular exercise, and fact is very sedentary Legs are getting weaker, shortness of breath on exertion  EKG on today's visit shows no sinus rhythm with rate 79 bpm, no significant ST or T-wave changes  Other past medical history reviewed  Thyroid nodule found, status post ablation, currently on thyroid supplementation This was performed March 2016     Allergies  Allergen Reactions  . Ciprofloxacin Diarrhea and Nausea And Vomiting  . Codeine Diarrhea and Nausea And Vomiting  . Metronidazole Diarrhea, Nausea And Vomiting and Nausea Only  . Sulfa Antibiotics Other (See Comments)    Distress  . Neomycin-Bacitracin Zn-Polymyx Other (See Comments)    Reaction:  Unknown   . Bacitracin-Polymyxin B Rash  . Celecoxib Itching, Rash and Other (See Comments)  . Petrolatum-Zinc Oxide Hives  . Statins Other  (See Comments)    Reaction:  Muscle pain  . Vioxx [Rofecoxib] Itching and Rash    Current Outpatient Prescriptions on File Prior to Visit  Medication Sig Dispense Refill  . ALPRAZolam (XANAX) 0.25 MG tablet Take 0.25 mg by mouth at bedtime as needed.     Marland Kitchen aspirin EC 81 MG tablet Take 81 mg by mouth daily.     . Cholecalciferol 1000 UNITS tablet Take 1,000 Units by mouth daily.     . ferrous fumarate (HEMOCYTE - 106 MG FE) 325 (106 FE) MG TABS tablet Take 1 tablet (106 mg of iron total) by mouth daily. 90 each 3  . furosemide (LASIX) 20 MG tablet TAKE 1 TABLET BY MOUTH AS NEEDED FOR EDEMA 30 tablet 3  . levothyroxine (SYNTHROID, LEVOTHROID) 100 MCG tablet TAKE 1 TABLET(100 MCG) BY MOUTH DAILY BEFORE BREAKFAST 30 tablet 0  . metFORMIN (GLUCOPHAGE) 500 MG tablet Take 500 mg by mouth 2 (two) times daily with a meal.    . pantoprazole (PROTONIX) 40 MG tablet Take 40 mg by mouth daily.    . potassium chloride (K-DUR) 10 MEQ tablet TAKE 1 TABLET BY MOUTH AS NEEDED. TAKE WITH FUROSEMIDE 30 tablet 3  . ramipril (ALTACE) 10 MG capsule TAKE 1 CAPSULE(10 MG) BY MOUTH DAILY 30 capsule 3  . Turmeric 500 MG CAPS Take 500 mg by mouth daily.     No current facility-administered medications on file prior to visit.    Past Medical History  Diagnosis Date  . Hyperlipidemia   . Hypertension   . Mild  reactive airways disease   . Chronic anxiety   . Esophagitis   . Diverticulosis   . History of colon polyps   . CAD (coronary artery disease)     a. lexiscan 08/2013: nl wall motion, no ischemia, EF 50-55%; b. cardiac cath 05/31/2014: mLAD 90%, dLAD 50%, dLCx 60%, 1st Mrg 80%, pRCA 60% s/p PCI/DES, mRCA 1st lesion 70%, mRCA 2nd 95% s/p PCI/long DES to cover entire mRCA, RPLB 40%;  c. 05/2014 Staged PCI of LAD w/ 2.5 x 15 mm Xience Alpine DES.  Marland Kitchen History of echocardiogram     a. 08/2013: normal LVSF, nl RVSF, no valvular regurgitation or stenosis   . Carotid artery disease ([place])   . PONV (postoperative nausea  and vomiting)   . Hypothyroidism   . Thyroid cancer ([place])     a. s/p right sided hemi-thyroidectomy; b. planning for radioactive iodine tx; c. followed by Dr. Marcelene Butte  . Type 2 diabetes mellitus ([place])   . GERD (gastroesophageal reflux disease)   . Osteoarthritis   . Rheumatoid arthritis ([place])   . Stroke [facility])     a. 05/2014 pt c/o garbled speech following cath 5/19. MRI/A 5/26 showed left caudate infarct->conservative mgmt per neuro.    Past Surgical History  Procedure Laterality Date  . Lumbar laminectomy  X 6  . Meniscus repair Bilateral     "2 on the right; 1 on the left"  . Breast implant removal  06/2001  . Abdominal hysterectomy    . Rotator cuff repair      "I think 2 left, 2 right"  . Cholecystectomy    . Ganglion cyst excision Right     AC joint  . Thyroidectomy    . Tonsillectomy    . Appendectomy    . Carpal tunnel release      "I've have a total of 7" (06/06/2014)  . Back surgery    . Augmentation mammaplasty    . Cataract extraction w/ intraocular lens  implant, bilateral Bilateral   . Tubal ligation    . Coronary angioplasty with stent placement  05/31/2014; 06/06/2014    "2; 1"  . Cardiac catheterization N/A 06/06/2014    Procedure: Coronary/Graft Atherectomy;  Surgeon: Wellington Hampshire, MD;  Location: [place] [facility];  Service: Cardiovascular;  Laterality: N/A;  . Cardiac catheterization N/A 05/31/2014    Procedure: Left Heart Cath;  Surgeon: Minna Merritts, MD;  Location: [place] [facility];  Service: Cardiovascular;  Laterality: N/A;  . Cardiac catheterization N/A 05/31/2014    Procedure: Coronary Stent Intervention;  Surgeon: Wellington Hampshire, MD;  Location: [place] [facility];  Service: Cardiovascular;  Laterality: N/A;    Social History  reports that she has never smoked. She has never used smokeless tobacco. She reports that she does not drink alcohol or use illicit drugs.  Family History family history includes Heart disease in her  sister.   Review of Systems  Constitutional: Positive for fatigue.  Respiratory: Positive for shortness of breath.   Cardiovascular: Positive for leg swelling.  Gastrointestinal: Negative.   Musculoskeletal: Negative.   Neurological: Negative.   Hematological: Negative.   Psychiatric/Behavioral: Negative.   All other systems reviewed and are negative.   BP 124/54 mmHg  Pulse 79  Ht 5\' 3"  (1.6 m)  Wt 153 lb 8 oz (69.627 kg)  BMI 27.20 kg/m2  Physical Exam  Constitutional: She is oriented to person, place, and time. She appears well-developed and well-nourished.  HENT:  Head:  Normocephalic.  Nose: Nose normal.  Mouth/Throat: Oropharynx is clear and moist.  Eyes: Conjunctivae are normal. Pupils are equal, round, and reactive to light.  Neck: Normal range of motion. Neck supple. No JVD present.  Cardiovascular: Normal rate, regular rhythm, normal heart sounds and intact distal pulses.  Exam reveals no gallop and no friction rub.   No murmur heard. Pulmonary/Chest: Effort normal and breath sounds normal. No respiratory distress. She has no wheezes. She has no rales. She exhibits no tenderness.  Abdominal: Soft. Bowel sounds are normal. She exhibits no distension. There is no tenderness.  Musculoskeletal: Normal range of motion. She exhibits no edema or tenderness.  Lymphadenopathy:    She has no cervical adenopathy.  Neurological: She is alert and oriented to person, place, and time. Coordination normal.  Skin: Skin is warm and dry. No rash noted. No erythema.  Psychiatric: She has a normal mood and affect. Her behavior is normal. Judgment and thought content normal.

## 2015-02-08 NOTE — Assessment & Plan Note (Signed)
Blood pressure is well controlled on today's visit. No changes made to the medications. 

## 2015-02-08 NOTE — Assessment & Plan Note (Signed)
She eats out 3 meals per day, poor diet, no exercise. We have encouraged continued exercise, careful diet management in an effort to lose weight.

## 2015-02-08 NOTE — Assessment & Plan Note (Addendum)
She reports having significant leg edema, improved recently on Lasix. Unable to exclude acute on chronic diastolic CHF. Long discussion concerning diet, decreasing her salt intake, decreasing her fluid intake when she has leg edema, daily weighing   Total encounter time more than 25 minutes  Greater than 50% was spent in counseling and coordination of care with the patient

## 2015-02-08 NOTE — Assessment & Plan Note (Signed)
Currently without symptoms of angina. We'll continue current medication regimen

## 2015-02-08 NOTE — Assessment & Plan Note (Signed)
Currently with no symptoms of angina. No further workup at this time. Continue current medication regimen. 

## 2015-02-14 ENCOUNTER — Other Ambulatory Visit: Payer: Self-pay | Admitting: Oncology

## 2015-03-08 ENCOUNTER — Other Ambulatory Visit: Payer: Self-pay | Admitting: Oncology

## 2015-04-07 ENCOUNTER — Other Ambulatory Visit: Payer: Self-pay | Admitting: Oncology

## 2015-04-13 ENCOUNTER — Other Ambulatory Visit: Payer: Self-pay | Admitting: Cardiovascular Disease

## 2015-05-10 ENCOUNTER — Other Ambulatory Visit: Payer: Self-pay | Admitting: Oncology

## 2015-05-11 ENCOUNTER — Encounter: Payer: Self-pay | Admitting: Emergency Medicine

## 2015-05-11 ENCOUNTER — Emergency Department
Admission: EM | Admit: 2015-05-11 | Discharge: 2015-05-11 | Disposition: A | Discharge status: Home / Self Care | Payer: Medicare Other | Attending: Emergency Medicine | Admitting: Emergency Medicine

## 2015-05-11 DIAGNOSIS — E039 Hypothyroidism, unspecified: Secondary | ICD-10-CM | POA: Insufficient documentation

## 2015-05-11 DIAGNOSIS — Z7984 Long term (current) use of oral hypoglycemic drugs: Secondary | ICD-10-CM | POA: Diagnosis not present

## 2015-05-11 DIAGNOSIS — L03115 Cellulitis of right lower limb: Secondary | ICD-10-CM | POA: Diagnosis not present

## 2015-05-11 DIAGNOSIS — Z7982 Long term (current) use of aspirin: Secondary | ICD-10-CM | POA: Insufficient documentation

## 2015-05-11 DIAGNOSIS — M1990 Unspecified osteoarthritis, unspecified site: Secondary | ICD-10-CM | POA: Diagnosis not present

## 2015-05-11 DIAGNOSIS — I2510 Atherosclerotic heart disease of native coronary artery without angina pectoris: Secondary | ICD-10-CM | POA: Insufficient documentation

## 2015-05-11 DIAGNOSIS — E785 Hyperlipidemia, unspecified: Secondary | ICD-10-CM | POA: Insufficient documentation

## 2015-05-11 DIAGNOSIS — Z85850 Personal history of malignant neoplasm of thyroid: Secondary | ICD-10-CM | POA: Insufficient documentation

## 2015-05-11 DIAGNOSIS — E119 Type 2 diabetes mellitus without complications: Secondary | ICD-10-CM | POA: Insufficient documentation

## 2015-05-11 DIAGNOSIS — Z8673 Personal history of transient ischemic attack (TIA), and cerebral infarction without residual deficits: Secondary | ICD-10-CM | POA: Diagnosis not present

## 2015-05-11 DIAGNOSIS — I10 Essential (primary) hypertension: Secondary | ICD-10-CM | POA: Insufficient documentation

## 2015-05-11 DIAGNOSIS — Z79899 Other long term (current) drug therapy: Secondary | ICD-10-CM | POA: Diagnosis not present

## 2015-05-11 LAB — COMPREHENSIVE METABOLIC PANEL
ALK PHOS: 64 U/L (ref 38–126)
ALT: 13 U/L — ABNORMAL LOW (ref 14–54)
ANION GAP: 10 (ref 5–15)
AST: 24 U/L (ref 15–41)
Albumin: 3.8 g/dL (ref 3.5–5.0)
BUN: 30 mg/dL — ABNORMAL HIGH (ref 6–20)
CALCIUM: 8.8 mg/dL — AB (ref 8.9–10.3)
CHLORIDE: 111 mmol/L (ref 101–111)
CO2: 19 mmol/L — AB (ref 22–32)
Creatinine, Ser: 0.9 mg/dL (ref 0.44–1.00)
GFR calc non Af Amer: 59 mL/min — ABNORMAL LOW (ref >60)
Glucose, Bld: 166 mg/dL — ABNORMAL HIGH (ref 65–99)
Potassium: 4.2 mmol/L (ref 3.5–5.1)
SODIUM: 140 mmol/L (ref 135–145)
Total Bilirubin: 0.3 mg/dL (ref 0.3–1.2)
Total Protein: 6.5 g/dL (ref 6.5–8.1)

## 2015-05-11 LAB — CBC WITH DIFFERENTIAL/PLATELET
BASOS PCT: 0 %
Band Neutrophils: 0 %
Basophils Absolute: 0 10*3/uL (ref 0–0.1)
Blasts: 0 %
EOS PCT: 0 %
Eosinophils Absolute: 0 10*3/uL (ref 0–0.7)
HCT: 31.3 % — ABNORMAL LOW (ref 35.0–47.0)
Hemoglobin: 10.2 g/dL — ABNORMAL LOW (ref 12.0–16.0)
LYMPHS ABS: 1 10*3/uL (ref 1.0–3.6)
LYMPHS PCT: 14 %
MCH: 27.9 pg (ref 26.0–34.0)
MCHC: 32.5 g/dL (ref 32.0–36.0)
MCV: 85.6 fL (ref 80.0–100.0)
MONO ABS: 0.2 10*3/uL (ref 0.2–0.9)
Metamyelocytes Relative: 0 %
Monocytes Relative: 3 %
Myelocytes: 0 %
NEUTROS PCT: 83 %
NRBC: 0 /100{WBCs}
Neutro Abs: 5.8 10*3/uL (ref 1.4–6.5)
OTHER: 0 %
PLATELETS: 181 10*3/uL (ref 150–440)
Promyelocytes Absolute: 0 %
RBC: 3.66 MIL/uL — ABNORMAL LOW (ref 3.80–5.20)
RDW: 15.1 % — ABNORMAL HIGH (ref 11.5–14.5)
WBC: 7 10*3/uL (ref 3.6–11.0)

## 2015-05-11 MED ORDER — MUPIROCIN 2 % EX OINT: TOPICAL_OINTMENT | CUTANEOUS | Status: DC

## 2015-05-11 MED ORDER — VANCOMYCIN HCL IN DEXTROSE 1-5 GM/200ML-% IV SOLN
1000.0000 mg | Freq: Once | INTRAVENOUS | Status: AC
  Administered 2015-05-11: 1000 mg via INTRAVENOUS
  Filled 2015-05-11: qty 200

## 2015-05-11 MED ORDER — CLINDAMYCIN HCL 300 MG PO CAPS: 300.0000 mg | ORAL_CAPSULE | Freq: Three times a day (TID) | ORAL | Status: DC

## 2015-05-11 NOTE — ED Provider Notes (Signed)
[facility] Emergency Department Provider Note        Time seen: ----------------------------------------- 4:14 PM on 05/11/2015 -----------------------------------------    I have reviewed the triage vital signs and the nursing notes.   HISTORY  Chief Complaint Cellulitis    HPI [name] is a 82 y.o. female who presents ER with right lower leg redness and swelling. Patient reports she thinks she may have been bitten by something last night. She has significant itching to the area, denies fevers chills or other complaints. She doesn't history of wounds on the right lower extremity after a fracture in the leg and wearing a cast. Patient denies other complaints this time.   Past Medical History  Diagnosis Date  . Hyperlipidemia   . Hypertension   . Mild reactive airways disease   . Chronic anxiety   . Esophagitis   . Diverticulosis   . History of colon polyps   . CAD (coronary artery disease)     a. lexiscan 08/2013: nl wall motion, no ischemia, EF 50-55%; b. cardiac cath 05/31/2014: mLAD 90%, dLAD 50%, dLCx 60%, 1st Mrg 80%, pRCA 60% s/p PCI/DES, mRCA 1st lesion 70%, mRCA 2nd 95% s/p PCI/long DES to cover entire mRCA, RPLB 40%;  c. 05/2014 Staged PCI of LAD w/ 2.5 x 15 mm Xience Alpine DES.  Marland Kitchen History of echocardiogram     a. 08/2013: normal LVSF, nl RVSF, no valvular regurgitation or stenosis   . Carotid artery disease ([place])   . PONV (postoperative nausea and vomiting)   . Hypothyroidism   . Thyroid cancer ([place])     a. s/p right sided hemi-thyroidectomy; b. planning for radioactive iodine tx; c. followed by Dr. Marcelene Butte  . Type 2 diabetes mellitus ([place])   . GERD (gastroesophageal reflux disease)   . Osteoarthritis   . Rheumatoid arthritis ([place])   . Stroke [facility])     a. 05/2014 pt c/o garbled speech following cath 5/19. MRI/A 5/26 showed left caudate infarct->conservative mgmt per neuro.    Patient Active Problem List   Diagnosis Date Noted   . Bilateral leg edema 02/08/2015  . Chronic anxiety 11/22/2014  . Cerebrovascular accident (CVA) ([place]) 07/25/2014  . Arterial vascular disease 07/25/2014  . Swelling of right lower extremity 06/22/2014  . Type 2 diabetes mellitus ([place])   . Stroke ([place]) 06/08/2014  . Anemia 06/07/2014  . Unstable angina ([place]) 06/06/2014  . Carotid artery disease ([place])   . CAD (coronary artery disease)   . Hyperlipidemia   . Hypertension   . Angina pectoris ([place]) 05/31/2014  . SOB (shortness of breath) 04/11/2014  . Palpitations 04/11/2014  . Other fatigue 04/11/2014  . Thyroid cancer ([place]) 04/11/2014    Past Surgical History  Procedure Laterality Date  . Lumbar laminectomy  X 6  . Meniscus repair Bilateral     "2 on the right; 1 on the left"  . Breast implant removal  06/2001  . Abdominal hysterectomy    . Rotator cuff repair      "I think 2 left, 2 right"  . Cholecystectomy    . Ganglion cyst excision Right     AC joint  . Thyroidectomy    . Tonsillectomy    . Appendectomy    . Carpal tunnel release      "I've have a total of 7" (06/06/2014)  . Back surgery    . Augmentation mammaplasty    . Cataract extraction w/ intraocular lens  implant, bilateral Bilateral   .  Tubal ligation    . Coronary angioplasty with stent placement  05/31/2014; 06/06/2014    "2; 1"  . Cardiac catheterization N/A 06/06/2014    Procedure: Coronary/Graft Atherectomy;  Surgeon: Wellington Hampshire, MD;  Location: [facility] [facility];  Service: Cardiovascular;  Laterality: N/A;  . Cardiac catheterization N/A 05/31/2014    Procedure: Left Heart Cath;  Surgeon: Minna Merritts, MD;  Location: [place] [facility];  Service: Cardiovascular;  Laterality: N/A;  . Cardiac catheterization N/A 05/31/2014    Procedure: Coronary Stent Intervention;  Surgeon: Wellington Hampshire, MD;  Location: [place] [facility];  Service: Cardiovascular;  Laterality: N/A;    Allergies Ciprofloxacin; Codeine; Metronidazole; Sulfa antibiotics;  Neomycin-bacitracin zn-polymyx; Bacitracin-polymyxin b; Celecoxib; Petrolatum-zinc oxide; Statins; and Vioxx  Social History Social History  Substance Use Topics  . Smoking status: Never Smoker   . Smokeless tobacco: Never Used  . Alcohol Use: No    Review of Systems Constitutional: Negative for fever. Eyes: Negative for visual changes. ENT: Negative for sore throat. Cardiovascular: Negative for chest pain. Respiratory: Negative for shortness of breath. Gastrointestinal: Negative for abdominal pain, vomiting and diarrhea. Genitourinary: Negative for dysuria. Musculoskeletal: Positive for right lower leg pain and itching Skin: Positive for right lower leg erythema Neurological: Negative for headaches, focal weakness or numbness.  10-point ROS otherwise negative.  ____________________________________________   PHYSICAL EXAM:  VITAL SIGNS: ED Triage Vitals  Enc Vitals Group     BP 05/11/15 1610 157/57 mmHg     Pulse Rate 05/11/15 1610 91     Resp 05/11/15 1610 18     Temp 05/11/15 1610 98 F (36.7 C)     Temp Source 05/11/15 1610 Oral     SpO2 05/11/15 1610 97 %     Weight 05/11/15 1610 155 lb (70.308 kg)     Height 05/11/15 1610 5\' 3"  (1.6 m)     Head Cir --      Peak Flow --      Pain Score --      Pain Loc --      Pain Edu? --      Excl. in [place]? --     Constitutional: Alert and oriented. Well appearing and in no distress. Eyes: Conjunctivae are normal. PERRL. Normal extraocular movements. ENT   Head: Normocephalic and atraumatic.   Nose: No congestion/rhinnorhea.   Mouth/Throat: Mucous membranes are moist.   Neck: No stridor. Cardiovascular: Normal rate, regular rhythm. No murmurs, rubs, or gallops. Respiratory: Normal respiratory effort without tachypnea nor retractions. Breath sounds are clear and equal bilaterally. No wheezes/rales/rhonchi. Gastrointestinal: Soft and nontender. Normal bowel sounds Musculoskeletal: Nontender with normal range of  motion in all extremities. No lower extremity tenderness nor edema. Neurologic:  Normal speech and language. No gross focal neurologic deficits are appreciated.  Skin:  Erythema that is circumferential around the right lower leg and ankle, some proximal bruising laterally is noted Psychiatric: Mood and affect are normal. Speech and behavior are normal.  ____________________________________________  ED COURSE:  Pertinent labs & imaging results that were available during my care of the patient were reviewed by me and considered in my medical decision making (see chart for details). Patient presents to ER with right lower extremity erythema concerning for cellulitis. I'll check basic labs give IV vancomycin. ____________________________________________    LABS (pertinent positives/negatives)  Labs Reviewed  CBC WITH DIFFERENTIAL/PLATELET - Abnormal; Notable for the following:    RBC 3.66 (*)    Hemoglobin 10.2 (*)  HCT 31.3 (*)    RDW 15.1 (*)    All other components within normal limits  COMPREHENSIVE METABOLIC PANEL - Abnormal; Notable for the following:    CO2 19 (*)    Glucose, Bld 166 (*)    BUN 30 (*)    Calcium 8.8 (*)    ALT 13 (*)    GFR calc non Af Amer 59 (*)    All other components within normal limits  CULTURE, BLOOD (ROUTINE X 2)  CULTURE, BLOOD (ROUTINE X 2)     ____________________________________________  FINAL ASSESSMENT AND PLAN  Cellulitis  Plan: Patient with labs and imaging as dictated above. Patient presents to ER for right lower extremity erythema that is consistent with cellulitis. She's been started on IV vancomycin, she'll be discharged with Septra and Bactroban and encouraged to have follow-up in 1-2 days for wound check.   Earleen Newport, MD   Note: This dictation was prepared with Dragon dictation. Any transcriptional errors that result from this process are unintentional   Earleen Newport, MD 05/11/15 1821

## 2015-05-11 NOTE — ED Notes (Signed)
Pt presents to ED with right lower leg redness and swelling. Pt reports she thinks she may have been bitten by something last night. Pt reports itching to the area. Palpable right pedal pulses. Pt denies pain to area.

## 2015-05-11 NOTE — Discharge Instructions (Signed)

## 2015-05-13 ENCOUNTER — Other Ambulatory Visit: Payer: Self-pay | Admitting: *Deleted

## 2015-05-13 ENCOUNTER — Other Ambulatory Visit: Payer: Self-pay | Admitting: Cardiovascular Disease

## 2015-05-13 MED ORDER — METOPROLOL SUCCINATE ER 50 MG PO TB24
50.0000 mg | ORAL_TABLET | Freq: Every day | ORAL | Status: DC
Start: 2015-05-13 — End: 2021-09-30

## 2015-05-14 ENCOUNTER — Observation Stay
Admission: EM | Admit: 2015-05-14 | Discharge: 2015-05-16 | Disposition: A | Discharge status: Home / Self Care | Payer: Medicare Other | Attending: Internal Medicine | Admitting: Internal Medicine

## 2015-05-14 ENCOUNTER — Encounter: Payer: Self-pay | Admitting: Emergency Medicine

## 2015-05-14 DIAGNOSIS — Z9889 Other specified postprocedural states: Secondary | ICD-10-CM | POA: Insufficient documentation

## 2015-05-14 DIAGNOSIS — Z9841 Cataract extraction status, right eye: Secondary | ICD-10-CM | POA: Diagnosis not present

## 2015-05-14 DIAGNOSIS — Z9842 Cataract extraction status, left eye: Secondary | ICD-10-CM | POA: Insufficient documentation

## 2015-05-14 DIAGNOSIS — Z90710 Acquired absence of both cervix and uterus: Secondary | ICD-10-CM | POA: Diagnosis not present

## 2015-05-14 DIAGNOSIS — E785 Hyperlipidemia, unspecified: Secondary | ICD-10-CM | POA: Insufficient documentation

## 2015-05-14 DIAGNOSIS — Z882 Allergy status to sulfonamides status: Secondary | ICD-10-CM | POA: Diagnosis not present

## 2015-05-14 DIAGNOSIS — Z7902 Long term (current) use of antithrombotics/antiplatelets: Secondary | ICD-10-CM | POA: Insufficient documentation

## 2015-05-14 DIAGNOSIS — Z8249 Family history of ischemic heart disease and other diseases of the circulatory system: Secondary | ICD-10-CM | POA: Diagnosis not present

## 2015-05-14 DIAGNOSIS — Z9049 Acquired absence of other specified parts of digestive tract: Secondary | ICD-10-CM | POA: Insufficient documentation

## 2015-05-14 DIAGNOSIS — E039 Hypothyroidism, unspecified: Secondary | ICD-10-CM | POA: Insufficient documentation

## 2015-05-14 DIAGNOSIS — Z86010 Personal history of colonic polyps: Secondary | ICD-10-CM | POA: Diagnosis not present

## 2015-05-14 DIAGNOSIS — Z7984 Long term (current) use of oral hypoglycemic drugs: Secondary | ICD-10-CM | POA: Insufficient documentation

## 2015-05-14 DIAGNOSIS — I10 Essential (primary) hypertension: Secondary | ICD-10-CM | POA: Diagnosis not present

## 2015-05-14 DIAGNOSIS — Z961 Presence of intraocular lens: Secondary | ICD-10-CM | POA: Insufficient documentation

## 2015-05-14 DIAGNOSIS — Z955 Presence of coronary angioplasty implant and graft: Secondary | ICD-10-CM | POA: Insufficient documentation

## 2015-05-14 DIAGNOSIS — Z85850 Personal history of malignant neoplasm of thyroid: Secondary | ICD-10-CM | POA: Insufficient documentation

## 2015-05-14 DIAGNOSIS — Z881 Allergy status to other antibiotic agents status: Secondary | ICD-10-CM | POA: Diagnosis not present

## 2015-05-14 DIAGNOSIS — E11628 Type 2 diabetes mellitus with other skin complications: Secondary | ICD-10-CM | POA: Diagnosis not present

## 2015-05-14 DIAGNOSIS — L03116 Cellulitis of left lower limb: Secondary | ICD-10-CM | POA: Insufficient documentation

## 2015-05-14 DIAGNOSIS — M069 Rheumatoid arthritis, unspecified: Secondary | ICD-10-CM | POA: Insufficient documentation

## 2015-05-14 DIAGNOSIS — Z888 Allergy status to other drugs, medicaments and biological substances status: Secondary | ICD-10-CM | POA: Insufficient documentation

## 2015-05-14 DIAGNOSIS — Z79899 Other long term (current) drug therapy: Secondary | ICD-10-CM | POA: Insufficient documentation

## 2015-05-14 DIAGNOSIS — F419 Anxiety disorder, unspecified: Secondary | ICD-10-CM | POA: Insufficient documentation

## 2015-05-14 DIAGNOSIS — I2510 Atherosclerotic heart disease of native coronary artery without angina pectoris: Secondary | ICD-10-CM | POA: Diagnosis not present

## 2015-05-14 DIAGNOSIS — Z8673 Personal history of transient ischemic attack (TIA), and cerebral infarction without residual deficits: Secondary | ICD-10-CM | POA: Diagnosis not present

## 2015-05-14 DIAGNOSIS — Z7982 Long term (current) use of aspirin: Secondary | ICD-10-CM | POA: Diagnosis not present

## 2015-05-14 DIAGNOSIS — L03119 Cellulitis of unspecified part of limb: Secondary | ICD-10-CM | POA: Diagnosis present

## 2015-05-14 DIAGNOSIS — K219 Gastro-esophageal reflux disease without esophagitis: Secondary | ICD-10-CM | POA: Insufficient documentation

## 2015-05-14 DIAGNOSIS — L0390 Cellulitis, unspecified: Secondary | ICD-10-CM | POA: Diagnosis present

## 2015-05-14 LAB — CBC
HCT: 31.7 % — ABNORMAL LOW (ref 35.0–47.0)
Hemoglobin: 10.6 g/dL — ABNORMAL LOW (ref 12.0–16.0)
MCH: 27.9 pg (ref 26.0–34.0)
MCHC: 33.5 g/dL (ref 32.0–36.0)
MCV: 83.2 fL (ref 80.0–100.0)
PLATELETS: 201 10*3/uL (ref 150–440)
RBC: 3.81 MIL/uL (ref 3.80–5.20)
RDW: 14.7 % — AB (ref 11.5–14.5)
WBC: 6.5 10*3/uL (ref 3.6–11.0)

## 2015-05-14 LAB — BASIC METABOLIC PANEL
ANION GAP: 11 (ref 5–15)
BUN: 35 mg/dL — ABNORMAL HIGH (ref 6–20)
CALCIUM: 8.9 mg/dL (ref 8.9–10.3)
CO2: 20 mmol/L — AB (ref 22–32)
CREATININE: 1.07 mg/dL — AB (ref 0.44–1.00)
Chloride: 106 mmol/L (ref 101–111)
GFR, EST AFRICAN AMERICAN: 56 mL/min — AB (ref >60)
GFR, EST NON AFRICAN AMERICAN: 48 mL/min — AB (ref >60)
Glucose, Bld: 188 mg/dL — ABNORMAL HIGH (ref 65–99)
Potassium: 4.2 mmol/L (ref 3.5–5.1)
Sodium: 137 mmol/L (ref 135–145)

## 2015-05-14 MED ORDER — PIPERACILLIN-TAZOBACTAM 3.375 G IVPB
3.3750 g | Freq: Once | INTRAVENOUS | Status: AC
  Administered 2015-05-14: 3.375 g via INTRAVENOUS
  Filled 2015-05-14: qty 50

## 2015-05-14 NOTE — ED Notes (Signed)
Pt presents to ED with redness and swelling to her legs. Seen in this ED Saturday for right leg swelling and redness, placed on antibiotics, and told to return to have affected leg rechecked to make sure the swelling and redness were improving. Pt states leg has not improved and now the left leg / foot has swelling and redness as well. Denies fever at home. Has been taking her oral and topical antibiotics as prescribed.

## 2015-05-14 NOTE — ED Notes (Signed)
Skin tears dressed w/ no-adherent dressing and curlex

## 2015-05-14 NOTE — ED Provider Notes (Signed)
[facility] Emergency Department Provider Note  ____________________________________________  Time seen: 11:00 PM  I have reviewed the triage vital signs and the nursing notes.   HISTORY  Chief Complaint Leg Swelling and Wound Check      HPI [name] is a 82 y.o. female with history of hypertension hyperlipidemia diabetes mellitus returns to the emergency department after being seen on 05/11/2015 for right lower leg pain and swelling and redness after "being bitten by something tonight before. Patient was prescribed clindamycin and discharged home at that time. Patient now returns with worsening redness of the right lower extremity as well as swelling as well as left foot to ankle redness swelling and discomfort. Patient denies any fever. Patient denies any other complaints.    Past Medical History  Diagnosis Date  . Hyperlipidemia   . Hypertension   . Mild reactive airways disease   . Chronic anxiety   . Esophagitis   . Diverticulosis   . History of colon polyps   . CAD (coronary artery disease)     a. lexiscan 08/2013: nl wall motion, no ischemia, EF 50-55%; b. cardiac cath 05/31/2014: mLAD 90%, dLAD 50%, dLCx 60%, 1st Mrg 80%, pRCA 60% s/p PCI/DES, mRCA 1st lesion 70%, mRCA 2nd 95% s/p PCI/long DES to cover entire mRCA, RPLB 40%;  c. 05/2014 Staged PCI of LAD w/ 2.5 x 15 mm Xience [place]pine DES.  Marland Kitchen History of echocardiogram     a. 08/2013: normal LVSF, nl RVSF, no valvular regurgitation or stenosis   . Carotid artery disease ([place])   . PONV (postoperative nausea and vomiting)   . Hypothyroidism   . Thyroid cancer ([place])     a. s/p right sided hemi-thyroidectomy; b. planning for radioactive iodine tx; c. followed by Dr. Marcelene [place]  . Type 2 diabetes mellitus ([place])   . GERD (gastroesophageal reflux disease)   . Osteoarthritis   . Rheumatoid arthritis ([place])   . Stroke [facility])     a. 05/2014 pt c/o garbled speech following cath 5/19. MRI/A 5/26 showed left  caudate infarct->conservative mgmt per neuro.    Patient Active Problem List   Diagnosis Date Noted  . Bilateral leg edema 02/08/2015  . Chronic anxiety 11/22/2014  . Cerebrovascular accident (CVA) ([place]) 07/25/2014  . Arterial vascular disease 07/25/2014  . Swelling of right lower extremity 06/22/2014  . Type 2 diabetes mellitus ([place])   . Stroke ([place]) 06/08/2014  . Anemia 06/07/2014  . Unstable angina ([place]) 06/06/2014  . Carotid artery disease ([place])   . CAD (coronary artery disease)   . Hyperlipidemia   . Hypertension   . Angina pectoris ([place]) 05/31/2014  . SOB (shortness of breath) 04/11/2014  . Palpitations 04/11/2014  . Other fatigue 04/11/2014  . Thyroid cancer ([place]) 04/11/2014    Past Surgical History  Procedure Laterality Date  . Lumbar laminectomy  X 6  . Meniscus repair Bilateral     "2 on the right; 1 on the left"  . Breast implant removal  06/2001  . Abdominal hysterectomy    . Rotator cuff repair      "I think 2 left, 2 right"  . Cholecystectomy    . Ganglion cyst excision Right     AC joint  . Thyroidectomy    . Tonsillectomy    . Appendectomy    . Carpal tunnel release      "I've have a total of 7" (06/06/2014)  . Back surgery    . Augmentation mammaplasty    .  Cataract extraction w/ intraocular lens  implant, bilateral Bilateral   . Tubal ligation    . Coronary angioplasty with stent placement  05/31/2014; 06/06/2014    "2; 1"  . Cardiac catheterization N/A 06/06/2014    Procedure: Coronary/Graft Atherectomy;  Surgeon: Wellington Hampshire, MD;  Location: [place] [facility];  Service: Cardiovascular;  Laterality: N/A;  . Cardiac catheterization N/A 05/31/2014    Procedure: Left Heart Cath;  Surgeon: Minna Merritts, MD;  Location: [place] [facility];  Service: Cardiovascular;  Laterality: N/A;  . Cardiac catheterization N/A 05/31/2014    Procedure: Coronary Stent Intervention;  Surgeon: Wellington Hampshire, MD;  Location: [place] [facility];  Service:  Cardiovascular;  Laterality: N/A;    Current Outpatient Rx  Name  Route  Sig  Dispense  Refill  . ALPRAZolam (XANAX) 0.25 MG tablet   Oral   Take 0.25 mg by mouth at bedtime as needed.          Marland Kitchen aspirin EC 81 MG tablet   Oral   Take 81 mg by mouth daily.          . Cholecalciferol 1000 UNITS tablet   Oral   Take 1,000 Units by mouth daily.          . clindamycin (CLEOCIN) 300 MG capsule   Oral   Take 1 capsule (300 mg total) by mouth 3 (three) times daily.   30 capsule   0   . clopidogrel (PLAVIX) 75 MG tablet   Oral   Take 1 tablet (75 mg total) by mouth daily.   90 tablet   3   . ferrous fumarate (HEMOCYTE - 106 MG FE) 325 (106 FE) MG TABS tablet   Oral   Take 1 tablet (106 mg of iron total) by mouth daily.   90 each   3   . furosemide (LASIX) 20 MG tablet      TAKE 1 TABLET BY MOUTH AS NEEDED FOR EDEMA   30 tablet   3   . levothyroxine (SYNTHROID, LEVOTHROID) 100 MCG tablet      TAKE 1 TABLET(100 MCG) BY MOUTH DAILY BEFORE BREAKFAST   30 tablet   0   . metFORMIN (GLUCOPHAGE) 500 MG tablet   Oral   Take 500 mg by mouth 2 (two) times daily with a meal.         . metoprolol succinate (TOPROL XL) 50 MG 24 hr tablet   Oral   Take 1 tablet (50 mg total) by mouth daily.   90 tablet   3   . mupirocin ointment (BACTROBAN) 2 %      Apply to affected area 3 times daily   22 g   0   . nitroGLYCERIN (NITROSTAT) 0.4 MG SL tablet      PLACE 1 TABLET UNDER THE TONGUE EVERY 5 MINUTES AS NEEDED FOR CHEST PAIN, call 911 if pain is not relieved after 3rd pill   25 tablet   6   . pantoprazole (PROTONIX) 40 MG tablet   Oral   Take 40 mg by mouth daily.         . potassium chloride (K-DUR) 10 MEQ tablet      TAKE 1 TABLET BY MOUTH AS NEEDED. TAKE WITH FUROSEMIDE   30 tablet   3   . ramipril (ALTACE) 10 MG capsule      TAKE 1 CAPSULE(10 MG) BY MOUTH DAILY   30 capsule   3   .  rosuvastatin (CRESTOR) 5 MG tablet   Oral   Take 1 tablet (5 mg  total) by mouth daily.   90 tablet   3   . Turmeric 500 MG CAPS   Oral   Take 500 mg by mouth daily.         Marland Kitchen levothyroxine (SYNTHROID, LEVOTHROID) 100 MCG tablet      TAKE 1 TABLET(100 MCG) BY MOUTH DAILY BEFORE BREAKFAST   30 tablet   0   . nitroGLYCERIN (NITROSTAT) 0.4 MG SL tablet      PLACE 1 TABLET UNDER THE TONGUE EVERY 5 MINUTES AS NEEDED FOR CHEST PAIN   25 tablet   3     Allergies Ciprofloxacin; Codeine; Metronidazole; Sulfa antibiotics; Neomycin-bacitracin zn-polymyx; Bacitracin-polymyxin b; Celecoxib; Petrolatum-zinc oxide; Statins; and Vioxx  Family History  Problem Relation Age of Onset  . Heart disease Sister     stent placed x 3     Social History Social History  Substance Use Topics  . Smoking status: Never Smoker   . Smokeless tobacco: Never Used  . Alcohol Use: No    Review of Systems  Constitutional: Negative for fever. Eyes: Negative for visual changes. ENT: Negative for sore throat. Cardiovascular: Negative for chest pain. Respiratory: Negative for shortness of breath. Gastrointestinal: Negative for abdominal pain, vomiting and diarrhea. Genitourinary: Negative for dysuria. Musculoskeletal: Negative for back pain. Skin: Negative for rash.Positive for bilateral leg redness and swelling Neurological: Negative for headaches, focal weakness or numbness.   10-point ROS otherwise negative.  ____________________________________________   PHYSICAL EXAM:  VITAL SIGNS: ED Triage Vitals  Enc Vitals Group     BP 05/14/15 2131 148/76 mmHg     Pulse Rate 05/14/15 2131 72     Resp 05/14/15 2131 18     Temp 05/14/15 2131 97.8 F (36.6 C)     Temp Source 05/14/15 2131 Oral     SpO2 05/14/15 2131 99 %     Weight 05/14/15 2131 155 lb (70.308 kg)     Height 05/14/15 2131 5\' 3"  (1.6 m)     Head Cir --      Peak Flow --      Pain Score 05/14/15 2131 5     Pain Loc --      Pain Edu? --      Excl. in [place]? --      Constitutional: Alert  and oriented. Well appearing and in no distress. Eyes: Conjunctivae are normal. PERRL. Normal extraocular movements. ENT   Head: Normocephalic and atraumatic.   Nose: No congestion/rhinnorhea.   Mouth/Throat: Mucous membranes are moist.   Neck: No stridor. Hematological/Lymphatic/Immunilogical: No cervical lymphadenopathy. Cardiovascular: Normal rate, regular rhythm. Normal and symmetric distal pulses are present in all extremities. No murmurs, rubs, or gallops. Respiratory: Normal respiratory effort without tachypnea nor retractions. Breath sounds are clear and equal bilaterally. No wheezes/rales/rhonchi. Gastrointestinal: Soft and nontender. No distention. There is no CVA tenderness. Genitourinary: deferred Musculoskeletal: Nontender with normal range of motion in all extremities. No joint effusions.  No lower extremity tenderness nor edema. Neurologic:  Normal speech and language. No gross focal neurologic deficits are appreciated. Speech is normal.  Skin:  Edematous, blanching erythematous left foot extending to ankle. Right mid lower leg [place] erythema Psychiatric: Mood and affect are normal. Speech and behavior are normal. Patient exhibits appropriate insight and judgment.  ____________________________________________    LABS (pertinent positives/negatives)  Labs Reviewed  CBC - Abnormal; Notable for the following:    Hemoglobin 10.6 (*)  HCT 31.7 (*)    RDW 14.7 (*)    All other components within normal limits  BASIC METABOLIC PANEL - Abnormal; Notable for the following:    CO2 20 (*)    Glucose, Bld 188 (*)    BUN 35 (*)    Creatinine, Ser 1.07 (*)    GFR calc non Af Amer 48 (*)    GFR calc Af Amer 56 (*)    All other components within normal limits        INITIAL IMPRESSION / ASSESSMENT AND PLAN / ED COURSE  Pertinent labs & imaging results that were available during my care of the patient were reviewed by me and considered in my medical decision  making (see chart for details).  Patient received Zosyn 3.375 mg given second very to worsening cellulitis despite appropriate treatment at home with clindamycin. In addition given patient's history of diabetes mellitus and IV antibiotics started. Patient discussed with Dr. Estanislado Pandy or hospital admission for further management.  ____________________________________________   FINAL CLINICAL IMPRESSION(S) / ED DIAGNOSES  Final diagnoses:  Cellulitis of lower extremity, unspecified laterality      Gregor Hams, MD 05/14/15 2328

## 2015-05-15 ENCOUNTER — Encounter: Payer: Self-pay | Admitting: Internal Medicine

## 2015-05-15 DIAGNOSIS — L0390 Cellulitis, unspecified: Secondary | ICD-10-CM | POA: Diagnosis present

## 2015-05-15 LAB — BASIC METABOLIC PANEL
Anion gap: 8 (ref 5–15)
BUN: 34 mg/dL — AB (ref 6–20)
CHLORIDE: 111 mmol/L (ref 101–111)
CO2: 23 mmol/L (ref 22–32)
CREATININE: 1.01 mg/dL — AB (ref 0.44–1.00)
Calcium: 8.8 mg/dL — ABNORMAL LOW (ref 8.9–10.3)
GFR, EST AFRICAN AMERICAN: 60 mL/min — AB (ref >60)
GFR, EST NON AFRICAN AMERICAN: 52 mL/min — AB (ref >60)
Glucose, Bld: 169 mg/dL — ABNORMAL HIGH (ref 65–99)
POTASSIUM: 4.2 mmol/L (ref 3.5–5.1)
SODIUM: 142 mmol/L (ref 135–145)

## 2015-05-15 LAB — CBC
HCT: 30.3 % — ABNORMAL LOW (ref 35.0–47.0)
Hemoglobin: 10.2 g/dL — ABNORMAL LOW (ref 12.0–16.0)
MCH: 27.9 pg (ref 26.0–34.0)
MCHC: 33.6 g/dL (ref 32.0–36.0)
MCV: 83 fL (ref 80.0–100.0)
PLATELETS: 187 10*3/uL (ref 150–440)
RBC: 3.65 MIL/uL — AB (ref 3.80–5.20)
RDW: 14.7 % — AB (ref 11.5–14.5)
WBC: 6 10*3/uL (ref 3.6–11.0)

## 2015-05-15 MED ORDER — PIPERACILLIN-TAZOBACTAM 3.375 G IVPB 30 MIN: 3.3750 g | Freq: Once | INTRAVENOUS | Status: DC

## 2015-05-15 MED ORDER — PANTOPRAZOLE SODIUM 40 MG PO TBEC
40.0000 mg | DELAYED_RELEASE_TABLET | Freq: Every day | ORAL | Status: DC
  Administered 2015-05-15 – 2015-05-16 (×2): 40 mg via ORAL
  Filled 2015-05-15 (×2): qty 1

## 2015-05-15 MED ORDER — HYDROCODONE-ACETAMINOPHEN 5-325 MG PO TABS: 1.0000 | ORAL_TABLET | ORAL | Status: DC | PRN

## 2015-05-15 MED ORDER — SODIUM CHLORIDE 0.9% FLUSH: 3.0000 mL | INTRAVENOUS | Status: DC | PRN

## 2015-05-15 MED ORDER — RAMIPRIL 10 MG PO CAPS
10.0000 mg | ORAL_CAPSULE | Freq: Every day | ORAL | Status: DC
  Administered 2015-05-15 – 2015-05-16 (×2): 10 mg via ORAL
  Filled 2015-05-15 (×2): qty 1

## 2015-05-15 MED ORDER — ASPIRIN EC 81 MG PO TBEC
81.0000 mg | DELAYED_RELEASE_TABLET | Freq: Every day | ORAL | Status: DC
  Administered 2015-05-15 – 2015-05-16 (×2): 81 mg via ORAL
  Filled 2015-05-15 (×2): qty 1

## 2015-05-15 MED ORDER — ACETAMINOPHEN 650 MG RE SUPP: 650.0000 mg | Freq: Four times a day (QID) | RECTAL | Status: DC | PRN

## 2015-05-15 MED ORDER — TURMERIC 500 MG PO CAPS: 500.0000 mg | ORAL_CAPSULE | Freq: Every day | ORAL | Status: DC

## 2015-05-15 MED ORDER — FERROUS FUMARATE 324 (106 FE) MG PO TABS
1.0000 | ORAL_TABLET | Freq: Every day | ORAL | Status: DC
  Administered 2015-05-15 – 2015-05-16 (×2): 106 mg via ORAL
  Filled 2015-05-15 (×2): qty 1

## 2015-05-15 MED ORDER — VANCOMYCIN HCL IN DEXTROSE 1-5 GM/200ML-% IV SOLN: 1000.0000 mg | Freq: Once | INTRAVENOUS | Status: DC

## 2015-05-15 MED ORDER — METFORMIN HCL 500 MG PO TABS
500.0000 mg | ORAL_TABLET | Freq: Two times a day (BID) | ORAL | Status: DC
  Administered 2015-05-15 – 2015-05-16 (×3): 500 mg via ORAL
  Filled 2015-05-15 (×3): qty 1

## 2015-05-15 MED ORDER — ONDANSETRON HCL 4 MG PO TABS: 4.0000 mg | ORAL_TABLET | Freq: Four times a day (QID) | ORAL | Status: DC | PRN

## 2015-05-15 MED ORDER — NITROGLYCERIN 0.4 MG SL SUBL: 0.4000 mg | SUBLINGUAL_TABLET | SUBLINGUAL | Status: DC | PRN

## 2015-05-15 MED ORDER — ACETAMINOPHEN 325 MG PO TABS: 650.0000 mg | ORAL_TABLET | Freq: Four times a day (QID) | ORAL | Status: DC | PRN

## 2015-05-15 MED ORDER — ALPRAZOLAM 0.25 MG PO TABS: 0.2500 mg | ORAL_TABLET | Freq: Every evening | ORAL | Status: DC | PRN

## 2015-05-15 MED ORDER — LEVOTHYROXINE SODIUM 100 MCG PO TABS
100.0000 ug | ORAL_TABLET | Freq: Every day | ORAL | Status: DC
  Administered 2015-05-15 – 2015-05-16 (×2): 100 ug via ORAL
  Filled 2015-05-15 (×2): qty 1

## 2015-05-15 MED ORDER — PIPERACILLIN-TAZOBACTAM 3.375 G IVPB
3.3750 g | Freq: Three times a day (TID) | INTRAVENOUS | Status: DC
  Filled 2015-05-15 (×2): qty 50

## 2015-05-15 MED ORDER — SODIUM CHLORIDE 0.9% FLUSH
3.0000 mL | Freq: Two times a day (BID) | INTRAVENOUS | Status: DC
  Administered 2015-05-15 – 2015-05-16 (×4): 3 mL via INTRAVENOUS

## 2015-05-15 MED ORDER — CLOPIDOGREL BISULFATE 75 MG PO TABS
75.0000 mg | ORAL_TABLET | Freq: Every day | ORAL | Status: DC
  Administered 2015-05-15 – 2015-05-16 (×2): 75 mg via ORAL
  Filled 2015-05-15 (×2): qty 1

## 2015-05-15 MED ORDER — ONDANSETRON HCL 4 MG/2ML IJ SOLN: 4.0000 mg | Freq: Four times a day (QID) | INTRAMUSCULAR | Status: DC | PRN

## 2015-05-15 MED ORDER — PIPERACILLIN-TAZOBACTAM 4.5 G IVPB
4.5000 g | Freq: Three times a day (TID) | INTRAVENOUS | Status: DC
  Administered 2015-05-15: 4.5 g via INTRAVENOUS
  Filled 2015-05-15 (×3): qty 100

## 2015-05-15 MED ORDER — METOPROLOL SUCCINATE ER 50 MG PO TB24
50.0000 mg | ORAL_TABLET | Freq: Every day | ORAL | Status: DC
  Administered 2015-05-15 – 2015-05-16 (×2): 50 mg via ORAL
  Filled 2015-05-15 (×2): qty 1

## 2015-05-15 MED ORDER — VANCOMYCIN HCL IN DEXTROSE 1-5 GM/200ML-% IV SOLN
1000.0000 mg | Freq: Once | INTRAVENOUS | Status: AC
  Administered 2015-05-15: 1000 mg via INTRAVENOUS
  Filled 2015-05-15: qty 200

## 2015-05-15 MED ORDER — ROSUVASTATIN CALCIUM 5 MG PO TABS
5.0000 mg | ORAL_TABLET | Freq: Every day | ORAL | Status: DC
  Administered 2015-05-15 – 2015-05-16 (×2): 5 mg via ORAL
  Filled 2015-05-15 (×2): qty 1

## 2015-05-15 MED ORDER — SODIUM CHLORIDE 0.9 % IV SOLN: 250.0000 mL | INTRAVENOUS | Status: DC | PRN

## 2015-05-15 MED ORDER — VITAMIN D 1000 UNITS PO TABS
1000.0000 [IU] | ORAL_TABLET | Freq: Every day | ORAL | Status: DC
  Administered 2015-05-15 – 2015-05-16 (×2): 1000 [IU] via ORAL
  Filled 2015-05-15 (×2): qty 1

## 2015-05-15 MED ORDER — SENNOSIDES-DOCUSATE SODIUM 8.6-50 MG PO TABS: 1.0000 | ORAL_TABLET | Freq: Every evening | ORAL | Status: DC | PRN

## 2015-05-15 MED ORDER — POTASSIUM CHLORIDE CRYS ER 10 MEQ PO TBCR
10.0000 meq | EXTENDED_RELEASE_TABLET | Freq: Every day | ORAL | Status: DC
  Administered 2015-05-15 – 2015-05-16 (×2): 10 meq via ORAL
  Filled 2015-05-15 (×4): qty 1

## 2015-05-15 MED ORDER — ENOXAPARIN SODIUM 40 MG/0.4ML ~~LOC~~ SOLN
40.0000 mg | SUBCUTANEOUS | Status: DC
  Administered 2015-05-16: 40 mg via SUBCUTANEOUS
  Filled 2015-05-15: qty 0.4

## 2015-05-15 MED ORDER — VANCOMYCIN HCL IN DEXTROSE 1-5 GM/200ML-% IV SOLN
1000.0000 mg | INTRAVENOUS | Status: DC
  Administered 2015-05-15: 1000 mg via INTRAVENOUS
  Filled 2015-05-15 (×2): qty 200

## 2015-05-15 NOTE — Plan of Care (Signed)
Problem: Safety: Goal: Ability to remain free from injury will improve Outcome: Progressing Bed alarm placed. Education giving   Problem: Skin Integrity: Goal: Risk for impaired skin integrity will decrease Outcome: Progressing Patient on home blood thinners. Skin tears noted and dressed.

## 2015-05-15 NOTE — Progress Notes (Signed)
Patient arrived to floor from ED. Patient alert and oriented . Patient refuses to undress ,wear gown, and high fall risk socks. Bed alarm intact.

## 2015-05-15 NOTE — H&P (Signed)
[facility] [place] NAME: [name]    MR#:  [id number]  DATE OF BIRTH:  19-Dec-1933  DATE OF ADMISSION:  05/14/2015  PRIMARY CARE PHYSICIAN: SPARKS,JEFFREY D, MD   REQUESTING/REFERRING PHYSICIAN:   CHIEF COMPLAINT:   Chief Complaint  Patient presents with  . Leg Swelling  . Wound Check    HISTORY OF PRESENT ILLNESS: [name]  is a 82 y.o. female with a known history of hypertension or lipidemia, colonic polyps, coronary artery disease, carotid artery disease, hypothyroidism, diabetes mellitus presented to the emergency room with the left ankle and left lower extremity redness and swelling. Patient was seen recently on Friday emergency room for for right leg cellulitis. Patient was discharged home on oral clindamycin. A day after discharge she was trying to get out of her car and the car door jam down to her right forearm she has redness and abrasion of the right forearm as well as the left shin area secondary to the car door slamming onto her while she was trying to get out of the car. Patient also noticed some redness and swelling around the left ankle. At the right lower extremity cellulitis is improved after clindamycin was given. Itching and pain is present in the left leg and left ankle area. The pain is aching in nature 4 out of 10 on a scale of 1-10. No history of chest pain. No history of shortness of breath. No history of headache dizziness blurry vision. No history of any fever or chills. Patient was given IV Zosyn antibiotic in the emergency room. Hospitalist service was consulted for further care.  PAST MEDICAL HISTORY:   Past Medical History  Diagnosis Date  . Hyperlipidemia   . Hypertension   . Mild reactive airways disease   . Chronic anxiety   . Esophagitis   . Diverticulosis   . History of colon polyps   . CAD (coronary artery disease)     a. lexiscan 08/2013: nl wall motion, no ischemia, EF 50-55%; b.  cardiac cath 05/31/2014: mLAD 90%, dLAD 50%, dLCx 60%, 1st Mrg 80%, pRCA 60% s/p PCI/DES, mRCA 1st lesion 70%, mRCA 2nd 95% s/p PCI/long DES to cover entire mRCA, RPLB 40%;  c. 05/2014 Staged PCI of LAD w/ 2.5 x 15 mm Xience [place]pine DES.  Marland Kitchen History of echocardiogram     a. 08/2013: normal LVSF, nl RVSF, no valvular regurgitation or stenosis   . Carotid artery disease ([place])   . PONV (postoperative nausea and vomiting)   . Hypothyroidism   . Thyroid cancer ([place])     a. s/p right sided hemi-thyroidectomy; b. planning for radioactive iodine tx; c. followed by Dr. Marcelene Butte  . Type 2 diabetes mellitus ([place])   . GERD (gastroesophageal reflux disease)   . Osteoarthritis   . Rheumatoid arthritis ([place])   . Stroke [facility])     a. 05/2014 pt c/o garbled speech following cath 5/19. MRI/A 5/26 showed left caudate infarct->conservative mgmt per neuro.    PAST SURGICAL HISTORY: Past Surgical History  Procedure Laterality Date  . Lumbar laminectomy  X 6  . Meniscus repair Bilateral     "2 on the right; 1 on the left"  . Breast implant removal  06/2001  . Abdominal hysterectomy    . Rotator cuff repair      "I think 2 left, 2 right"  . Cholecystectomy    . Ganglion cyst excision Right     AC joint  .  Thyroidectomy    . Tonsillectomy    . Appendectomy    . Carpal tunnel release      "I've have a total of 7" (06/06/2014)  . Back surgery    . Augmentation mammaplasty    . Cataract extraction w/ intraocular lens  implant, bilateral Bilateral   . Tubal ligation    . Coronary angioplasty with stent placement  05/31/2014; 06/06/2014    "2; 1"  . Cardiac catheterization N/A 06/06/2014    Procedure: Coronary/Graft Atherectomy;  Surgeon: Wellington Hampshire, MD;  Location: [facility];  Service: Cardiovascular;  Laterality: N/A;  . Cardiac catheterization N/A 05/31/2014    Procedure: Left Heart Cath;  Surgeon: Minna Merritts, MD;  Location: [facility];  Service: Cardiovascular;  Laterality: N/A;  .  Cardiac catheterization N/A 05/31/2014    Procedure: Coronary Stent Intervention;  Surgeon: Wellington Hampshire, MD;  Location: [facility];  Service: Cardiovascular;  Laterality: N/A;    SOCIAL HISTORY:  Social History  Substance Use Topics  . Smoking status: Never Smoker   . Smokeless tobacco: Never Used  . Alcohol Use: No    FAMILY HISTORY:  Family History  Problem Relation Age of Onset  . Heart disease Sister     stent placed x 3     DRUG ALLERGIES:  Allergies  Allergen Reactions  . Ciprofloxacin Diarrhea and Nausea And Vomiting  . Codeine Diarrhea and Nausea And Vomiting  . Metronidazole Diarrhea, Nausea And Vomiting and Nausea Only  . Sulfa Antibiotics Other (See Comments)    Distress  . Neomycin-Bacitracin Zn-Polymyx Other (See Comments)    Reaction:  Unknown   . Bacitracin-Polymyxin B Rash  . Celecoxib Itching, Rash and Other (See Comments)  . Petrolatum-Zinc Oxide Hives  . Statins Other (See Comments)    Reaction:  Muscle pain  . Vioxx [Rofecoxib] Itching and Rash    REVIEW OF SYSTEMS:   CONSTITUTIONAL: No fever, fatigue or weakness.  EYES: No blurred or double vision.  EARS, NOSE, AND THROAT: No tinnitus or ear pain.  RESPIRATORY: No cough, shortness of breath, wheezing or hemoptysis.  CARDIOVASCULAR: No chest pain, orthopnea, edema.  GASTROINTESTINAL: No nausea, vomiting, diarrhea or abdominal pain.  GENITOURINARY: No dysuria, hematuria.  ENDOCRINE: No polyuria, nocturia,  HEMATOLOGY: No anemia, easy bruising or bleeding SKIN: redness left lower leg and left ankle area. MUSCULOSKELETAL: No joint pain or arthritis.  Abrasion over left shin and right fore arm area. NEUROLOGIC: No tingling, numbness, weakness.  PSYCHIATRY: No anxiety or depression.   MEDICATIONS AT HOME:  Prior to Admission medications   Medication Sig Start Date End Date Taking? Authorizing Provider  ALPRAZolam (XANAX) 0.25 MG tablet Take 0.25 mg by mouth at bedtime as needed.   08/23/13  Yes Historical Provider, MD  aspirin EC 81 MG tablet Take 81 mg by mouth daily.    Yes Historical Provider, MD  Cholecalciferol 1000 UNITS tablet Take 1,000 Units by mouth daily.    Yes Historical Provider, MD  clindamycin (CLEOCIN) 300 MG capsule Take 1 capsule (300 mg total) by mouth 3 (three) times daily. 05/11/15  Yes Earleen Newport, MD  clopidogrel (PLAVIX) 75 MG tablet Take 1 tablet (75 mg total) by mouth daily. 02/08/15  Yes Minna Merritts, MD  ferrous fumarate (HEMOCYTE - 106 MG FE) 325 (106 FE) MG TABS tablet Take 1 tablet (106 mg of iron total) by mouth daily. 11/22/14  Yes Forest Gleason, MD  furosemide (LASIX) 20  MG tablet TAKE 1 TABLET BY MOUTH AS NEEDED FOR EDEMA 12/12/14  Yes Minna Merritts, MD  levothyroxine (SYNTHROID, LEVOTHROID) 100 MCG tablet TAKE 1 TABLET(100 MCG) BY MOUTH DAILY BEFORE BREAKFAST 04/08/15  Yes Forest Gleason, MD  metFORMIN (GLUCOPHAGE) 500 MG tablet Take 500 mg by mouth 2 (two) times daily with a meal.   Yes Historical Provider, MD  metoprolol succinate (TOPROL XL) 50 MG 24 hr tablet Take 1 tablet (50 mg total) by mouth daily. 05/13/15  Yes Minna Merritts, MD  mupirocin ointment (BACTROBAN) 2 % Apply to affected area 3 times daily 05/11/15 05/10/16 Yes Earleen Newport, MD  nitroGLYCERIN (NITROSTAT) 0.4 MG SL tablet PLACE 1 TABLET UNDER THE TONGUE EVERY 5 MINUTES AS NEEDED FOR CHEST PAIN, call 911 if pain is not relieved after 3rd pill 02/08/15  Yes Minna Merritts, MD  pantoprazole (PROTONIX) 40 MG tablet Take 40 mg by mouth daily.   Yes Historical Provider, MD  potassium chloride (K-DUR) 10 MEQ tablet TAKE 1 TABLET BY MOUTH AS NEEDED. TAKE WITH FUROSEMIDE 12/12/14  Yes Minna Merritts, MD  ramipril (ALTACE) 10 MG capsule TAKE 1 CAPSULE(10 MG) BY MOUTH DAILY 05/13/15  Yes Minna Merritts, MD  rosuvastatin (CRESTOR) 5 MG tablet Take 1 tablet (5 mg total) by mouth daily. 02/08/15  Yes Minna Merritts, MD  Turmeric 500 MG CAPS Take 500 mg by mouth daily.    Yes Historical Provider, MD  levothyroxine (SYNTHROID, LEVOTHROID) 100 MCG tablet TAKE 1 TABLET(100 MCG) BY MOUTH DAILY BEFORE BREAKFAST 05/10/15   Forest Gleason, MD  nitroGLYCERIN (NITROSTAT) 0.4 MG SL tablet PLACE 1 TABLET UNDER THE TONGUE EVERY 5 MINUTES AS NEEDED FOR CHEST PAIN 04/15/15   Minna Merritts, MD      PHYSICAL EXAMINATION:   VITAL SIGNS: Blood pressure 125/47, pulse 71, temperature 97.8 F (36.6 C), temperature source Oral, resp. rate 16, height 5\' 3"  (1.6 m), weight 70.308 kg (155 lb), SpO2 99 %.  GENERAL:  82 y.o.-year-old patient lying in the bed with no acute distress.  EYES: Pupils equal, round, reactive to light and accommodation. No scleral icterus. Extraocular muscles intact.  HEENT: Head atraumatic, normocephalic. Oropharynx and nasopharynx clear.  NECK:  Supple, no jugular venous distention. No thyroid enlargement, no tenderness.  LUNGS: Normal breath sounds bilaterally, no wheezing, rales,rhonchi or crepitation. No use of accessory muscles of respiration.  CARDIOVASCULAR: S1, S2 normal. No murmurs, rubs, or gallops.  ABDOMEN: Soft, nontender, nondistended. Bowel sounds present. No organomegaly or mass.  EXTREMITIES: No pedal edema, cyanosis, or clubbing. Left ankle and left lower leg rendess noted.Abrasion over right fore arm and left shin area. NEUROLOGIC: Cranial nerves II through XII are intact. Muscle strength 5/5 in all extremities. Sensation intact. Gait normal. PSYCHIATRIC: The patient is alert and oriented x 3.  SKIN: No obvious rash, lesion, or ulcer.   LABORATORY PANEL:   CBC  Recent Labs Lab 05/11/15 1645 05/14/15 2144  WBC 7.0 6.5  HGB 10.2* 10.6*  HCT 31.3* 31.7*  PLT 181 201  MCV 85.6 83.2  MCH 27.9 27.9  MCHC 32.5 33.5  RDW 15.1* 14.7*  LYMPHSABS 1.0  --   MONOABS 0.2  --   EOSABS 0.0  --   BASOSABS 0.0  --     ------------------------------------------------------------------------------------------------------------------  Chemistries   Recent Labs Lab 05/11/15 1645 05/14/15 2144  NA 140 137  K 4.2 4.2  CL 111 106  CO2 19* 20*  GLUCOSE 166* 188*  BUN 30*  35*  CREATININE 0.90 1.07*  CALCIUM 8.8* 8.9  AST 24  --   ALT 13*  --   ALKPHOS 64  --   BILITOT 0.3  --    ------------------------------------------------------------------------------------------------------------------ estimated creatinine clearance is 40.1 mL/min (by C-G formula based on Cr of 1.07). ------------------------------------------------------------------------------------------------------------------ No results for input(s): TSH, T4TOTAL, T3FREE, THYROIDAB in the last 72 hours.  Invalid input(s): FREET3   Coagulation profile No results for input(s): INR, PROTIME in the last 168 hours. ------------------------------------------------------------------------------------------------------------------- No results for input(s): DDIMER in the last 72 hours. -------------------------------------------------------------------------------------------------------------------  Cardiac Enzymes No results for input(s): CKMB, TROPONINI, MYOGLOBIN in the last 168 hours.  Invalid input(s): CK ------------------------------------------------------------------------------------------------------------------ Invalid input(s): POCBNP  ---------------------------------------------------------------------------------------------------------------  Urinalysis    Component Value Date/Time   COLORURINE Yellow 10/31/2013 1407   COLORURINE YELLOW 09/20/2009 1206   APPEARANCEUR Hazy 10/31/2013 1407   APPEARANCEUR CLEAR 09/20/2009 1206   LABSPEC 1.011 10/31/2013 1407   LABSPEC 1.010 09/20/2009 1206   PHURINE 5.0 10/31/2013 1407   PHURINE 5.5 09/20/2009 1206   GLUCOSEU Negative 10/31/2013 1407   GLUCOSEU NEGATIVE  09/20/2009 1206   HGBUR Negative 10/31/2013 1407   HGBUR NEGATIVE 09/20/2009 1206   BILIRUBINUR Negative 10/31/2013 [place] 09/20/2009 1206   KETONESUR Negative 10/31/2013 [place] 09/20/2009 1206   PROTEINUR Negative 10/31/2013 1407   PROTEINUR NEGATIVE 09/20/2009 1206   UROBILINOGEN 0.2 09/20/2009 1206   NITRITE Negative 10/31/2013 1407   NITRITE NEGATIVE 09/20/2009 1206   LEUKOCYTESUR Trace 10/31/2013 1407   LEUKOCYTESUR SMALL* 09/20/2009 1206     RADIOLOGY: No results found.  EKG: Orders placed or performed in visit on 02/08/15  . EKG 12-Lead    IMPRESSION AND PLAN: 82 year old female patient with history of hypothyroidism, coronary artery disease, hypertension, hyperlipidemia, carotid artery disease presented to the emergency room with the redness of the left ankle and also swelling of the left lower extremity and redness. Admitting diagnosis 1. Left lower extremity cellulitis 2. Left lower extremity pain secondary to cellulitis 3. Hypertension 4. Coronary artery disease 5. Hyperlipidemia  Treatment plan Admit patient to medical floor Start patient on IV vancomycin and IV Zosyn antibiotic DVT prophylaxis with subcutaneous Lovenox 40 MG daily Pain management with oral Norco Resume home medication for hypertension and hyperlipidemia Supportive care.   All the records are reviewed and case discussed with ED provider. Management plans discussed with the patient, family and they are in agreement.  CODE STATUS:FULL    Code Status Orders        Start     Ordered   05/15/15 0057  Full code   Continuous     05/15/15 0057    Code Status History    Date Active Date Inactive Code Status Order ID Comments User Context   06/06/2014  4:35 PM 06/08/2014  4:59 PM Full Code [id number]  Wellington Hampshire, MD Inpatient   05/31/2014  1:14 PM 06/01/2014  7:53 PM Full Code [id number]  Wellington Hampshire, MD Inpatient       TOTAL TIME TAKING  CARE OF THIS PATIENT: 51 minutes.    Saundra Shelling M.D on 05/15/2015 at 12:57 AM  Between 7am to 6pm - Pager - [phone]  After 6pm go to [email or web address] - password [email or web address] [facility]  Office  [phone]  CC: Primary care physician; Idelle Crouch, MD

## 2015-05-15 NOTE — Progress Notes (Signed)
[place] [facility] at [place] NAME: [name]    MR#:  [id number]  DATE OF BIRTH:  [birth year]  SUBJECTIVE: 82 year old female patient admitted this morning because of left leg pain, swelling, left leg cellulitis. Patient was given clindamycin the day before but because of worsening of symptoms she is admitted again.   CHIEF COMPLAINT:   Chief Complaint  Patient presents with  . Leg Swelling  . Wound Check    REVIEW OF SYSTEMS:   ROS CONSTITUTIONAL: No fever, fatigue or weakness.  EYES: No blurred or double vision.  EARS, NOSE, AND THROAT: No tinnitus or ear pain.  RESPIRATORY: No cough, shortness of breath, wheezing or hemoptysis.  CARDIOVASCULAR: No chest pain, orthopnea, edema.  GASTROINTESTINAL: No nausea, vomiting, diarrhea or abdominal pain.  GENITOURINARY: No dysuria,  Or hematuria. ENDOCRINE: No polyuria, nocturia,  HEMATOLOGY: No anemia, easy bruising or bleeding SKIN: No rash or lesion. MUSCULOSKELETAL: Left foot redness, pain.NEUROLOGIC: No tingling, numbness, weakness.  PSYCHIATRY: No anxiety or depression.   DRUG ALLERGIES:   Allergies  Allergen Reactions  . Ciprofloxacin Diarrhea and Nausea And Vomiting  . Codeine Diarrhea and Nausea And Vomiting  . Metronidazole Diarrhea, Nausea And Vomiting and Nausea Only  . Sulfa Antibiotics Other (See Comments)    Distress  . Neomycin-Bacitracin Zn-Polymyx Other (See Comments)    Reaction:  Unknown   . Bacitracin-Polymyxin B Rash  . Celecoxib Itching, Rash and Other (See Comments)  . Petrolatum-Zinc Oxide Hives  . Statins Other (See Comments)    Reaction:  Muscle pain  . Vioxx [Rofecoxib] Itching and Rash    VITALS:  Blood pressure 158/51, pulse 80, temperature 98 F (36.7 C), temperature source Oral, resp. rate 18, height 5\' 3"  (1.6 m), weight 70.308 kg (155 lb), SpO2 98 %.  PHYSICAL EXAMINATION:  GENERAL:  82 y.o.-year-old patient lying in the bed with no acute  distress.  EYES: Pupils equal, round, reactive to light and accommodation. No scleral icterus. Extraocular muscles intact.  HEENT: Head atraumatic, normocephalic. Oropharynx and nasopharynx clear.  NECK:  Supple, no jugular venous distention. No thyroid enlargement, no tenderness.  LUNGS: Normal breath sounds bilaterally, no wheezing, rales,rhonchi or crepitation. No use of accessory muscles of respiration.  CARDIOVASCULAR: S1, S2 normal. No murmurs, rubs, or gallops.  ABDOMEN: Soft, nontender, nondistended. Bowel sounds present. No organomegaly or mass.  EXTREMITIES:  redness, swelling on the dorsum of the left foot slightly tender to palpation. Right foot is not red and not swollen.  NEUROLOGIC: Cranial nerves II through XII are intact. Muscle strength 5/5 in all extremities. Sensation intact. Gait not checked.  PSYCHIATRIC: The patient is alert and oriented x 3.  SKIN: No obvious rash, lesion, or ulcer.    LABORATORY PANEL:   CBC  Recent Labs Lab 05/15/15 0342  WBC 6.0  HGB 10.2*  HCT 30.3*  PLT 187   ------------------------------------------------------------------------------------------------------------------  Chemistries   Recent Labs Lab 05/11/15 1645  05/15/15 0342  NA 140  < > 142  K 4.2  < > 4.2  CL 111  < > 111  CO2 19*  < > 23  GLUCOSE 166*  < > 169*  BUN 30*  < > 34*  CREATININE 0.90  < > 1.01*  CALCIUM 8.8*  < > 8.8*  AST 24  --   --   ALT 13*  --   --   ALKPHOS 64  --   --   BILITOT 0.3  --   --   < > =  values in this interval not displayed. ------------------------------------------------------------------------------------------------------------------  Cardiac Enzymes No results for input(s): TROPONINI in the last 168 hours. ------------------------------------------------------------------------------------------------------------------  RADIOLOGY:  No results found.  EKG:   Orders placed or performed in visit on 02/08/15  . EKG 12-Lead     ASSESSMENT AND PLAN:   Left foot cellulitis ; failed outpatient therapy with clindamycin, symptoms worsened requiring IV antibiotics with vancomycin and Zosyn. Patient is improving. Likely discharge tomorrow morning with Keflex or clindamycin.   and has no fever, WBC is normal. Elevate the leg.  #2 . Htn;controlled.continue home medications including Toprol-XL 50 MG daily ramipril 10 MG daily Crestor 5 MG daily  3.Hypothryroid;on  Levothyroxine. 4.DMII;continue Metformin.  FI chronic anxiety. #6 coronary artery disease  #7 history of GERD #8 rheumatoid arthritis H/o CVA; continue aspirin, Plavix, statins.   All the records are reviewed and case discussed with Care Management/Social Workerr. Management plans discussed with the patient, family and they are in agreement.  CODE STATUS: full  TOTAL TIME TAKING CARE OF THIS PATIENT: 20 minutes.   POSSIBLE D/C IN 1-2DAYS, DEPENDING ON CLINICAL CONDITION.   Epifanio Lesches M.D on 05/15/2015 at 12:15 PM  Between 7am to 6pm - Pager - [phone]  After 6pm go to [email or web address] - password [email or web address] [facility]  Office  [phone]  CC: Primary care physician; Idelle Crouch, MD   Note: This dictation was prepared with Dragon dictation along with smaller phrase technology. Any transcriptional errors that result from this process are unintentional.

## 2015-05-15 NOTE — Care Management Obs Status (Signed)
[facility] NOTIFICATION   Patient Details  Name: [name] MRN: [id number] Date of Birth: 1933/07/20   Medicare Observation Status Notification Given:  Yes  CODE [address], RN 05/15/2015, 1:59 PM

## 2015-05-15 NOTE — Progress Notes (Signed)
Pharmacy Antibiotic Note  [name] is a 82 y.o. female admitted on 05/14/2015 with cellulitis.  Pharmacy has been consulted for vancomycin and Zosyn dosing.  Plan: DW 60kg  Vd 43L kei 0.04 hr-1  T1/2 17 hours Vancomycin 1 gram q 24 hours ordered with stacked dosing. Level before 5th dose. Goal trough 15-20.  Zosyn 4.5 grams q 8 hours ordered for Pseudomonas risk of recent antibiotic usage as outpatient (clindamycin.)  Height: 5\' 3"  (160 cm) Weight: 155 lb (70.308 kg) IBW/kg (Calculated) : 52.4  Temp (24hrs), Avg:98.1 F (36.7 C), Min:97.8 F (36.6 C), Max:98.4 F (36.9 C)   Recent Labs Lab 05/11/15 1645 05/14/15 2144 05/15/15 0342  WBC 7.0 6.5 6.0  CREATININE 0.90 1.07* 1.01*    Estimated Creatinine Clearance: 42.5 mL/min (by C-G formula based on Cr of 1.01).    Allergies  Allergen Reactions  . Ciprofloxacin Diarrhea and Nausea And Vomiting  . Codeine Diarrhea and Nausea And Vomiting  . Metronidazole Diarrhea, Nausea And Vomiting and Nausea Only  . Sulfa Antibiotics Other (See Comments)    Distress  . Neomycin-Bacitracin Zn-Polymyx Other (See Comments)    Reaction:  Unknown   . Bacitracin-Polymyxin B Rash  . Celecoxib Itching, Rash and Other (See Comments)  . Petrolatum-Zinc Oxide Hives  . Statins Other (See Comments)    Reaction:  Muscle pain  . Vioxx [Rofecoxib] Itching and Rash    Antimicrobials this admission: vancomycin  >>  Zosyn  >>   Dose adjustments this admission:   Microbiology results: 4/29 BCx: NG x 3 days   Thank you for allowing pharmacy to be a part of this patient's care.  Tahiri Shareef S 05/15/2015 5:49 AM

## 2015-05-15 NOTE — Progress Notes (Signed)
Inpatient Diabetes Program Recommendations  AACE/ADA: New Consensus Statement on Inpatient Glycemic Control (2015)  Target Ranges:  Prepandial:   less than 140 mg/dL      Peak postprandial:   less than 180 mg/dL (1-2 hours)      Critically ill patients:  140 - 180 mg/dL   Review of Glycemic Control:  Results for HERMA, GEFFERT (MRN [id number]) as of 05/15/2015 14:20  Ref. Range 05/14/2015 21:44 05/15/2015 03:42  Glucose Latest Ref Range: 65-99 mg/dL 188 (H) 169 (H)   Diabetes history: Type 2 diabetes Outpatient Diabetes medications: Metformin 500 mg bid Current orders for Inpatient glycemic control:  Metformin 500 mg bid Inpatient Diabetes Program Recommendations:   Please consider adding Novolog moderate tid with meals and HS.  Thanks, Adah Perl, RN, BC-ADM Inpatient Diabetes Coordinator Pager [phone] (8a-5p)

## 2015-05-15 NOTE — Care Management Note (Addendum)
Case Management Note  Patient Details  Name: [name] MRN: [id number] Date of Birth: 1934/01/12  Subjective/Objective:                  Met with patient to discuss discharge planning. She denies RNCM needs. She states she never went to cardiac rehab. She states her PCP is Dr. Georgie Chard. She states she uses a cane to ambulate; drives. She states she lives with her husband. She denies difficulty obtaining medications from [facility] [address].   Action/Plan: No anticipated RNCM needs. Home health list left with patient just in case something changes.   Expected Discharge Date:                  Expected Discharge Plan:     In-House Referral:     Discharge planning Services  CM Consult  Post Acute Care Choice:    Choice offered to:  Patient  DME Arranged:    DME Agency:     HH Arranged:    [place] Agency:     Status of Service:  Completed, signed off  Medicare Important Message Given:    Date Medicare IM Given:    Medicare IM give by:    Date Additional Medicare IM Given:    Additional Medicare Important Message give by:     If discussed at [place] of Stay Meetings, dates discussed:    Additional Comments:  Marshell Garfinkel, RN 05/15/2015, 9:46 AM

## 2015-05-16 LAB — CULTURE, BLOOD (ROUTINE X 2)
CULTURE: NO GROWTH
Culture: NO GROWTH

## 2015-05-16 MED ORDER — CEPHALEXIN 500 MG PO CAPS: 500.0000 mg | ORAL_CAPSULE | Freq: Four times a day (QID) | ORAL | Status: DC

## 2015-05-16 NOTE — Progress Notes (Signed)
[facility] NAME: [name]    MR#:  [id number]  DATE OF BIRTH:  [birth year]  SUBJECTIVE improving cellulitis of the left foot. Stable for discharge.   CHIEF COMPLAINT:   Chief Complaint  Patient presents with  . Leg Swelling  . Wound Check    REVIEW OF SYSTEMS:   ROS CONSTITUTIONAL: No fever, fatigue or weakness.  EYES: No blurred or double vision.  EARS, NOSE, AND THROAT: No tinnitus or ear pain.  RESPIRATORY: No cough, shortness of breath, wheezing or hemoptysis.  CARDIOVASCULAR: No chest pain, orthopnea, edema.  GASTROINTESTINAL: No nausea, vomiting, diarrhea or abdominal pain.  GENITOURINARY: No dysuria,  Or hematuria. ENDOCRINE: No polyuria, nocturia,  HEMATOLOGY: No anemia, easy bruising or bleeding SKIN: No rash or lesion. MUSCULOSKELETAL: Left foot redness, pain.NEUROLOGIC: No tingling, numbness, weakness.  PSYCHIATRY: No anxiety or depression.   DRUG ALLERGIES:   Allergies  Allergen Reactions  . Ciprofloxacin Diarrhea and Nausea And Vomiting  . Codeine Diarrhea and Nausea And Vomiting  . Metronidazole Diarrhea, Nausea And Vomiting and Nausea Only  . Sulfa Antibiotics Other (See Comments)    Distress  . Neomycin-Bacitracin Zn-Polymyx Other (See Comments)    Reaction:  Unknown   . Bacitracin-Polymyxin B Rash  . Celecoxib Itching, Rash and Other (See Comments)  . Petrolatum-Zinc Oxide Hives  . Statins Other (See Comments)    Reaction:  Muscle pain  . Vioxx [Rofecoxib] Itching and Rash    VITALS:  Blood pressure 134/50, pulse 73, temperature 98 F (36.7 C), temperature source Oral, resp. rate 16, height 5\' 3"  (1.6 m), weight 70.308 kg (155 lb), SpO2 100 %.  PHYSICAL EXAMINATION:  GENERAL:  82 y.o.-year-old patient lying in the bed with no acute distress.  EYES: Pupils equal, round, reactive to light and accommodation. No scleral icterus. Extraocular muscles intact.  HEENT: Head atraumatic,  normocephalic. Oropharynx and nasopharynx clear.  NECK:  Supple, no jugular venous distention. No thyroid enlargement, no tenderness.  LUNGS: Normal breath sounds bilaterally, no wheezing, rales,rhonchi or crepitation. No use of accessory muscles of respiration.  CARDIOVASCULAR: S1, S2 normal. No murmurs, rubs, or gallops.  ABDOMEN: Soft, nontender, nondistended. Bowel sounds present. No organomegaly or mass.  EXTREMITIES:  redness, swelling on the dorsum of the left foot slightly tender to palpation. Right foot is not red and not swollen.  NEUROLOGIC: Cranial nerves II through XII are intact. Muscle strength 5/5 in all extremities. Sensation intact. Gait not checked.  PSYCHIATRIC: The patient is alert and oriented x 3.  SKIN: No obvious rash, lesion, or ulcer.    LABORATORY PANEL:   CBC  Recent Labs Lab 05/15/15 0342  WBC 6.0  HGB 10.2*  HCT 30.3*  PLT 187   ------------------------------------------------------------------------------------------------------------------  Chemistries   Recent Labs Lab 05/11/15 1645  05/15/15 0342  NA 140  < > 142  K 4.2  < > 4.2  CL 111  < > 111  CO2 19*  < > 23  GLUCOSE 166*  < > 169*  BUN 30*  < > 34*  CREATININE 0.90  < > 1.01*  CALCIUM 8.8*  < > 8.8*  AST 24  --   --   ALT 13*  --   --   ALKPHOS 64  --   --   BILITOT 0.3  --   --   < > = values in this interval not displayed. ------------------------------------------------------------------------------------------------------------------  Cardiac Enzymes No results for input(s): TROPONINI in  the last 168 hours. ------------------------------------------------------------------------------------------------------------------  RADIOLOGY:  No results found.  EKG:   Orders placed or performed in visit on 02/08/15  . EKG 12-Lead    ASSESSMENT AND PLAN:   Left foot cellulitis ; failed outpatient therapy with clindamycin, Patient received IV vancomycin, Zosyn. Discharge  home with Keflex.  #2 . Htn;controlled.continue home medications including Toprol-XL 50 MG daily ramipril 10 MG daily Crestor 5 MG daily  3.Hypothryroid;on  Levothyroxine. 4.DMII;continue Metformin.  5. chronic anxiety. #6 coronary artery disease  #7 history of GERD #8 rheumatoid arthritis H/o CVA; continue aspirin, Plavix, statins.   All the records are reviewed and case discussed with Care Management/Social Workerr. Management plans discussed with the patient, family and they are in agreement.  CODE STATUS: full  TOTAL TIME TAKING CARE OF THIS PATIENT: 20 minutes.   Discharge home today   Dequann Vandervelden M.D on 05/16/2015 at 2:01 PM  Between 7am to 6pm - Pager - [phone]  After 6pm go to [email or web address] - password [email or web address] [facility]  Office  [phone]  CC: Primary care physician; Idelle Crouch, MD   Note: This dictation was prepared with Dragon dictation along with smaller phrase technology. Any transcriptional errors that result from this process are unintentional.

## 2015-05-16 NOTE — Progress Notes (Signed)
DISCHARGE NOTE:  Pt given discharge instructions, pt verbalized understanding. Pt wheeled to car by staff.

## 2015-05-17 NOTE — Discharge Summary (Signed)
[name], is a 82 y.o. female  DOB [birth year]  MRN [id number].  Admission date:  05/14/2015  Admitting Physician  Saundra Shelling, MD  Discharge Date:  05/16/2015   Primary MD  Idelle Crouch, MD  Recommendations for primary care physician for things to follow:   Follow up with  primary doctor in 1 week   Admission Diagnosis  Cellulitis of lower extremity, unspecified laterality [L03.119]   Discharge Diagnosis  Cellulitis of lower extremity, unspecified laterality [L03.119]    Principal Problem:   Cellulitis      Past Medical History  Diagnosis Date  . Hyperlipidemia   . Hypertension   . Mild reactive airways disease   . Chronic anxiety   . Esophagitis   . Diverticulosis   . History of colon polyps   . CAD (coronary artery disease)     a. lexiscan 08/2013: nl wall motion, no ischemia, EF 50-55%; b. cardiac cath 05/31/2014: mLAD 90%, dLAD 50%, dLCx 60%, 1st Mrg 80%, pRCA 60% s/p PCI/DES, mRCA 1st lesion 70%, mRCA 2nd 95% s/p PCI/long DES to cover entire mRCA, RPLB 40%;  c. 05/2014 Staged PCI of LAD w/ 2.5 x 15 mm Xience Alpine DES.  Marland Kitchen History of echocardiogram     a. 08/2013: normal LVSF, nl RVSF, no valvular regurgitation or stenosis   . Carotid artery disease ([place])   . PONV (postoperative nausea and vomiting)   . Hypothyroidism   . Thyroid cancer ([place])     a. s/p right sided hemi-thyroidectomy; b. planning for radioactive iodine tx; c. followed by Dr. Marcelene Butte  . Type 2 diabetes mellitus ([place])   . GERD (gastroesophageal reflux disease)   . Osteoarthritis   . Rheumatoid arthritis ([place])   . Stroke [facility])     a. 05/2014 pt c/o garbled speech following cath 5/19. MRI/A 5/26 showed left caudate infarct->conservative mgmt per neuro.    Past Surgical History  Procedure Laterality Date  . Lumbar laminectomy  X 6   . Meniscus repair Bilateral     "2 on the right; 1 on the left"  . Breast implant removal  06/2001  . Abdominal hysterectomy    . Rotator cuff repair      "I think 2 left, 2 right"  . Cholecystectomy    . Ganglion cyst excision Right     AC joint  . Thyroidectomy    . Tonsillectomy    . Appendectomy    . Carpal tunnel release      "I've have a total of 7" (06/06/2014)  . Back surgery    . Augmentation mammaplasty    . Cataract extraction w/ intraocular lens  implant, bilateral Bilateral   . Tubal ligation    . Coronary angioplasty with stent placement  05/31/2014; 06/06/2014    "2; 1"  . Cardiac catheterization N/A 06/06/2014    Procedure: Coronary/Graft Atherectomy;  Surgeon: Wellington Hampshire, MD;  Location: [place] CV LAB;  Service: Cardiovascular;  Laterality: N/A;  . Cardiac catheterization N/A 05/31/2014    Procedure: Left Heart Cath;  Surgeon: Minna Merritts, MD;  Location: [place] [facility];  Service: Cardiovascular;  Laterality: N/A;  . Cardiac catheterization N/A 05/31/2014    Procedure: Coronary Stent Intervention;  Surgeon: Wellington Hampshire, MD;  Location: [place] [facility];  Service: Cardiovascular;  Laterality: N/A;       History of present illness and  Hospital Course:     Kindly see H&P for history of present illness  and admission details, please review complete Labs, Consult reports and Test reports for all details in brief  HPI  from the history and physical done on the day of admission 82 year old female patient admitted for cellulitis of the left foot, failed outpatient therapy with clindamycin.   Hospital Course  #1 left foot cellulitis without evidence of fever or white count, patient placed in observation status. Patient received IV vancomycin. Redness, tenderness of the left foot improved. Patient wanted to go home but she did not want to take clindamycin , and wrote rescription for Keflex. 2. #2 hyperlipidemia continue statins. #3 essential  hypertension: Patient is on Toprol-XL 50 MG by mouth daily 4 anxiety: Patient is on Xanax. #5 mellitus type II: Patient is on metformin 500 mg by mouth twice a day. #6 hypothyroidism continue levothyroxine 100 g daily. 7 history of CVA patient with aspirin, Plavix: Continue that.  Discharge Condition: stable   Follow UP  Follow-up Information    Follow up with SPARKS,JEFFREY D, MD On 05/23/2015.   Specialty:  Internal Medicine   Why:  at 1045   Contact information:   [place] [id number] [phone]         Discharge Instructions  and  Discharge Medications        Medication List    STOP taking these medications        clindamycin 300 MG capsule  Commonly known as:  CLEOCIN      TAKE these medications        ALPRAZolam 0.25 MG tablet  Commonly known as:  XANAX  Take 0.25 mg by mouth at bedtime as needed.     aspirin EC 81 MG tablet  Take 81 mg by mouth daily.     cephALEXin 500 MG capsule  Commonly known as:  KEFLEX  Take 1 capsule (500 mg total) by mouth 4 (four) times daily.     Cholecalciferol 1000 units tablet  Take 1,000 Units by mouth daily.     clopidogrel 75 MG tablet  Commonly known as:  PLAVIX  Take 1 tablet (75 mg total) by mouth daily.     ferrous fumarate 325 (106 Fe) MG Tabs tablet  Commonly known as:  HEMOCYTE - 106 mg FE  Take 1 tablet (106 mg of iron total) by mouth daily.     furosemide 20 MG tablet  Commonly known as:  LASIX  TAKE 1 TABLET BY MOUTH AS NEEDED FOR EDEMA     levothyroxine 100 MCG tablet  Commonly known as:  SYNTHROID, LEVOTHROID  TAKE 1 TABLET(100 MCG) BY MOUTH DAILY BEFORE BREAKFAST     metFORMIN 500 MG tablet  Commonly known as:  GLUCOPHAGE  Take 500 mg by mouth 2 (two) times daily with a meal.     metoprolol succinate 50 MG 24 hr tablet  Commonly known as:  TOPROL XL  Take 1 tablet (50 mg total) by mouth daily.     mupirocin ointment 2 %  Commonly known as:   BACTROBAN  Apply to affected area 3 times daily     nitroGLYCERIN 0.4 MG SL tablet  Commonly known as:  NITROSTAT  PLACE 1 TABLET UNDER THE TONGUE EVERY 5 MINUTES AS NEEDED FOR CHEST PAIN, call 911 if pain is not relieved after 3rd pill     pantoprazole 40 MG tablet  Commonly known as:  PROTONIX  Take 40 mg by mouth daily.     potassium chloride 10 MEQ tablet  Commonly known as:  K-DUR  TAKE 1 TABLET BY MOUTH AS NEEDED. TAKE WITH FUROSEMIDE     ramipril 10 MG capsule  Commonly known as:  ALTACE  TAKE 1 CAPSULE(10 MG) BY MOUTH DAILY     rosuvastatin 5 MG tablet  Commonly known as:  CRESTOR  Take 1 tablet (5 mg total) by mouth daily.     Turmeric 500 MG Caps  Take 500 mg by mouth daily.          Diet and Activity recommendation: See Discharge Instructions above   Consults obtained - none   Major procedures and Radiology Reports - PLEASE review detailed and final reports for all details, in brief -     No results found.  Micro Results    Recent Results (from the past 240 hour(s))  Blood culture (routine x 2)     Status: None   Collection Time: 05/11/15  4:40 PM  Result Value Ref Range Status   Specimen Description BLOOD LEFT WRIST  Final   Special Requests BOTTLES DRAWN AEROBIC AND ANAEROBIC 1 CC  Final   Culture NO GROWTH 5 DAYS  Final   Report Status 05/16/2015 FINAL  Final  Blood culture (routine x 2)     Status: None   Collection Time: 05/11/15  4:50 PM  Result Value Ref Range Status   Specimen Description BLOOD RIGHT ASSIST CONTROL  Final   Special Requests BOTTLES DRAWN AEROBIC AND ANAEROBIC 1 CC  Final   Culture NO GROWTH 5 DAYS  Final   Report Status 05/16/2015 FINAL  Final       Today   Subjective:   [name] today has no headache,no chest abdominal pain,no new weakness tingling or numbness, feels much better wants to go home today.   Objective:   Blood pressure 134/50, pulse 73, temperature 98 F (36.7 C), temperature source  Oral, resp. rate 16, height 5\' 3"  (1.6 m), weight 70.308 kg (155 lb), SpO2 100 %.  No intake or output data in the 24 hours ending 05/17/15 1329  Exam Awake Alert, Oriented x 3, No new F.N deficits, Normal affect [place].AT,PERRAL Supple Neck,No JVD, No cervical lymphadenopathy appriciated.  Symmetrical Chest wall movement, Good air movement bilaterally, CTAB RRR,No Gallops,Rubs or new Murmurs, No Parasternal Heave +ve B.Sounds, Abd Soft, Non tender, No organomegaly appriciated, No rebound -guarding or rigidity. No Cyanosis, Clubbing or edema, No new Rash or bruise Decreased it redness of the left foot. Data Review   CBC w Diff:  Lab Results  Component Value Date   WBC 6.0 05/15/2015   WBC 4.4 05/04/2014   HGB 10.2* 05/15/2015   HGB 12.6 05/04/2014   HCT 30.3* 05/15/2015   HCT 38.1 05/04/2014   PLT 187 05/15/2015   PLT 180 05/04/2014   LYMPHOPCT 14 05/11/2015   LYMPHOPCT 25.8 05/04/2014   BANDSPCT 0 05/11/2015   MONOPCT 3 05/11/2015   MONOPCT 5.4 05/04/2014   EOSPCT 0 05/11/2015   EOSPCT 1.6 05/04/2014   BASOPCT 0 05/11/2015   BASOPCT 0.9 05/04/2014    CMP:  Lab Results  Component Value Date   NA 142 05/15/2015   NA 139 05/04/2014   K 4.2 05/15/2015   K 4.5 05/04/2014   CL 111 05/15/2015   CL 105 05/04/2014   CO2 23 05/15/2015   CO2 26 05/04/2014   BUN 34* 05/15/2015   BUN 20 05/04/2014   CREATININE 1.01* 05/15/2015   CREATININE 1.10* 05/04/2014   PROT 6.5 05/11/2015  PROT 6.9 05/04/2014   ALBUMIN 3.8 05/11/2015   ALBUMIN 4.1 05/04/2014   BILITOT 0.3 05/11/2015   BILITOT 0.6 05/04/2014   ALKPHOS 64 05/11/2015   ALKPHOS 52 05/04/2014   AST 24 05/11/2015   AST 26 05/04/2014   ALT 13* 05/11/2015   ALT 17 05/04/2014  .   Total Time in preparing paper work, data evaluation and todays exam - 69 minutes  Gerturde Kuba M.D on 05/16/2015 at 1:29 PM    Note: This dictation was prepared with Dragon dictation along with smaller phrase technology. Any  transcriptional errors that result from this process are unintentional.

## 2015-05-21 ENCOUNTER — Other Ambulatory Visit: Payer: Self-pay | Admitting: Internal Medicine

## 2015-05-21 DIAGNOSIS — N63 Unspecified lump in unspecified breast: Secondary | ICD-10-CM

## 2015-05-22 ENCOUNTER — Inpatient Hospital Stay (HOSPITAL_BASED_OUTPATIENT_CLINIC_OR_DEPARTMENT_OTHER): Payer: Medicare Other | Admitting: Oncology

## 2015-05-22 ENCOUNTER — Inpatient Hospital Stay: Payer: Medicare Other | Attending: Oncology

## 2015-05-22 ENCOUNTER — Encounter: Payer: Self-pay | Admitting: Oncology

## 2015-05-22 VITALS — BP 144/77 | HR 76 | Temp 97.7°F | Resp 18 | Wt 157.4 lb

## 2015-05-22 DIAGNOSIS — Z955 Presence of coronary angioplasty implant and graft: Secondary | ICD-10-CM | POA: Insufficient documentation

## 2015-05-22 DIAGNOSIS — D62 Acute posthemorrhagic anemia: Secondary | ICD-10-CM

## 2015-05-22 DIAGNOSIS — E785 Hyperlipidemia, unspecified: Secondary | ICD-10-CM | POA: Insufficient documentation

## 2015-05-22 DIAGNOSIS — E119 Type 2 diabetes mellitus without complications: Secondary | ICD-10-CM | POA: Insufficient documentation

## 2015-05-22 DIAGNOSIS — M069 Rheumatoid arthritis, unspecified: Secondary | ICD-10-CM | POA: Diagnosis not present

## 2015-05-22 DIAGNOSIS — K219 Gastro-esophageal reflux disease without esophagitis: Secondary | ICD-10-CM | POA: Insufficient documentation

## 2015-05-22 DIAGNOSIS — C73 Malignant neoplasm of thyroid gland: Secondary | ICD-10-CM | POA: Insufficient documentation

## 2015-05-22 DIAGNOSIS — Z8673 Personal history of transient ischemic attack (TIA), and cerebral infarction without residual deficits: Secondary | ICD-10-CM | POA: Diagnosis not present

## 2015-05-22 DIAGNOSIS — Z7982 Long term (current) use of aspirin: Secondary | ICD-10-CM | POA: Insufficient documentation

## 2015-05-22 DIAGNOSIS — Z7984 Long term (current) use of oral hypoglycemic drugs: Secondary | ICD-10-CM | POA: Insufficient documentation

## 2015-05-22 DIAGNOSIS — Z79899 Other long term (current) drug therapy: Secondary | ICD-10-CM | POA: Insufficient documentation

## 2015-05-22 DIAGNOSIS — I2510 Atherosclerotic heart disease of native coronary artery without angina pectoris: Secondary | ICD-10-CM | POA: Diagnosis not present

## 2015-05-22 DIAGNOSIS — D649 Anemia, unspecified: Secondary | ICD-10-CM

## 2015-05-22 DIAGNOSIS — I10 Essential (primary) hypertension: Secondary | ICD-10-CM | POA: Diagnosis not present

## 2015-05-22 DIAGNOSIS — E890 Postprocedural hypothyroidism: Secondary | ICD-10-CM | POA: Diagnosis not present

## 2015-05-22 LAB — COMPREHENSIVE METABOLIC PANEL
ALK PHOS: 74 U/L (ref 38–126)
ALT: 13 U/L — AB (ref 14–54)
AST: 23 U/L (ref 15–41)
Albumin: 4.4 g/dL (ref 3.5–5.0)
Anion gap: 9 (ref 5–15)
BILIRUBIN TOTAL: 0.7 mg/dL (ref 0.3–1.2)
BUN: 35 mg/dL — AB (ref 6–20)
CALCIUM: 9.3 mg/dL (ref 8.9–10.3)
CO2: 20 mmol/L — ABNORMAL LOW (ref 22–32)
CREATININE: 0.99 mg/dL (ref 0.44–1.00)
Chloride: 107 mmol/L (ref 101–111)
GFR calc Af Amer: >60 mL/min (ref >60)
GFR, EST NON AFRICAN AMERICAN: 53 mL/min — AB (ref >60)
Glucose, Bld: 102 mg/dL — ABNORMAL HIGH (ref 65–99)
Potassium: 4.4 mmol/L (ref 3.5–5.1)
Sodium: 136 mmol/L (ref 135–145)
TOTAL PROTEIN: 7.1 g/dL (ref 6.5–8.1)

## 2015-05-22 LAB — CBC WITH DIFFERENTIAL/PLATELET
Basophils Absolute: 0.1 10*3/uL (ref 0–0.1)
Basophils Relative: 1 %
Eosinophils Absolute: 0.2 10*3/uL (ref 0–0.7)
Eosinophils Relative: 3 %
HEMATOCRIT: 31.1 % — AB (ref 35.0–47.0)
HEMOGLOBIN: 10.4 g/dL — AB (ref 12.0–16.0)
Lymphs Abs: 1 10*3/uL (ref 1.0–3.6)
MCH: 27.9 pg (ref 26.0–34.0)
MCHC: 33.5 g/dL (ref 32.0–36.0)
MCV: 83.2 fL (ref 80.0–100.0)
MONO ABS: 0.5 10*3/uL (ref 0.2–0.9)
Monocytes Relative: 6 %
NEUTROS ABS: 6 10*3/uL (ref 1.4–6.5)
Platelets: 243 10*3/uL (ref 150–440)
RBC: 3.74 MIL/uL — AB (ref 3.80–5.20)
RDW: 14.2 % (ref 11.5–14.5)
WBC: 7.8 10*3/uL (ref 3.6–11.0)

## 2015-05-22 LAB — IRON AND TIBC
Iron: 57 ug/dL (ref 28–170)
Saturation Ratios: 20 % (ref 10.4–31.8)
TIBC: 283 ug/dL (ref 250–450)
UIBC: 226 ug/dL

## 2015-05-22 LAB — FERRITIN: FERRITIN: 54 ng/mL (ref 11–307)

## 2015-05-22 LAB — TSH: TSH: 1.309 u[IU]/mL (ref 0.350–4.500)

## 2015-05-22 NOTE — Progress Notes (Signed)
[place] @ [facility] [phone]  Fax:([phone] [place]: [birth year]  MR#: [id number]  FKC#:[id number]  Patient Care Team: Idelle Crouch, MD as PCP - General (Internal Medicine) Minna Merritts, MD as Consulting Physician (Cardiology)  CHIEF COMPLAINT:  Chief Complaint  Patient presents with  . Thyroid Cancer    Oncology History   1.papillary carcinoma of thyroid (right lobe)multifocal.  0.5 cm tumor extending into perithyroid soft tissue status post total thyroidectomy.  One lymph node was negative.  No angiolymphatic invasion seen T3 N0 M0 stage IIIa disease(diagnosis in March of2016) 2.  Status post radioactive iodine therapy in May of 2016     Thyroid cancer ([place])   04/11/2014 Initial Diagnosis Thyroid cancer      INTERVAL HISTORY:  82 year old lady has just finished radioactive iodine therapy came today for the follow-up.  He tolerated treatment very well.  Patient started on Synthroid had some swelling in the neck area which is gradually resolving. Patient is feeling stronger.Marland Kitchen No chills and fever.  Appetite is improving. Patient is here for ongoing evaluation regarding carcinoma of thyroid.  Getting her strength back. REVIEW OF SYSTEMS:   Gen. status: No chills and fever.  Appetite has been stable. HEENT: Swelling in the neck area has not improved Respiratory system: No cough.  No hemoptysis.  No shortness of breath at rest or exertion.  No chest pain. Cardiovascular system: No chest pain.  No palpitation.  No paroxysmal nocturnal dyspnea. Gastro intestinal system: No heartburn.  No nausea or vomiting.  No abdominal pain.  No diarrhea.  No constipation.  No rectal bleeding. Skin: No evidence of ecchymosis or rash. Neurological system: No dizziness.  No tingling.  His was.  no tingling numbness.  no focal weakness or any focal signs. Lower extremities no edema  As per HPI. Otherwise, a complete review of systems is  negatve.  PAST MEDICAL HISTORY: Past Medical History  Diagnosis Date  . Hyperlipidemia   . Hypertension   . Mild reactive airways disease   . Chronic anxiety   . Esophagitis   . Diverticulosis   . History of colon polyps   . CAD (coronary artery disease)     a. lexiscan 08/2013: nl wall motion, no ischemia, EF 50-55%; b. cardiac cath 05/31/2014: mLAD 90%, dLAD 50%, dLCx 60%, 1st Mrg 80%, pRCA 60% s/p PCI/DES, mRCA 1st lesion 70%, mRCA 2nd 95% s/p PCI/long DES to cover entire mRCA, RPLB 40%;  c. 05/2014 Staged PCI of LAD w/ 2.5 x 15 mm Xience Alpine DES.  Marland Kitchen History of echocardiogram     a. 08/2013: normal LVSF, nl RVSF, no valvular regurgitation or stenosis   . Carotid artery disease ([place])   . PONV (postoperative nausea and vomiting)   . Hypothyroidism   . Thyroid cancer ([place])     a. s/p right sided hemi-thyroidectomy; b. planning for radioactive iodine tx; c. followed by Dr. Marcelene Butte  . Type 2 diabetes mellitus ([place])   . GERD (gastroesophageal reflux disease)   . Osteoarthritis   . Rheumatoid arthritis ([place])   . Stroke [facility])     a. 05/2014 pt c/o garbled speech following cath 5/19. MRI/A 5/26 showed left caudate infarct->conservative mgmt per neuro.    PAST SURGICAL HISTORY: Past Surgical History  Procedure Laterality Date  . Lumbar laminectomy  X 6  . Meniscus repair Bilateral     "2 on the right; 1 on the left"  . Breast implant removal  06/2001  . Abdominal hysterectomy    . Rotator cuff repair      "I think 2 left, 2 right"  . Cholecystectomy    . Ganglion cyst excision Right     AC joint  . Thyroidectomy    . Tonsillectomy    . Appendectomy    . Carpal tunnel release      "I've have a total of 7" (06/06/2014)  . Back surgery    . Augmentation mammaplasty    . Cataract extraction w/ intraocular lens  implant, bilateral Bilateral   . Tubal ligation    . Coronary angioplasty with stent placement  05/31/2014; 06/06/2014    "2; 1"  . Cardiac catheterization N/A 06/06/2014     Procedure: Coronary/Graft Atherectomy;  Surgeon: Wellington Hampshire, MD;  Location: [place] [facility];  Service: Cardiovascular;  Laterality: N/A;  . Cardiac catheterization N/A 05/31/2014    Procedure: Left Heart Cath;  Surgeon: Minna Merritts, MD;  Location: [place] [facility];  Service: Cardiovascular;  Laterality: N/A;  . Cardiac catheterization N/A 05/31/2014    Procedure: Coronary Stent Intervention;  Surgeon: Wellington Hampshire, MD;  Location: [place] [facility];  Service: Cardiovascular;  Laterality: N/A;    FAMILY HISTORY Family History  Problem Relation Age of Onset  . Heart disease Sister     stent placed x 3     GYNECOLOGIC HISTORY:  No LMP recorded. Patient is postmenopausal.     ADVANCED DIRECTIVES:  Patient does have advanced health care directive  HEALTH MAINTENANCE: Social History  Substance Use Topics  . Smoking status: Never Smoker   . Smokeless tobacco: Never Used  . Alcohol Use: No      Allergies  Allergen Reactions  . Ciprofloxacin Diarrhea and Nausea And Vomiting  . Codeine Diarrhea and Nausea And Vomiting  . Metronidazole Diarrhea, Nausea And Vomiting and Nausea Only  . Sulfa Antibiotics Other (See Comments)    Distress  . Neomycin-Bacitracin Zn-Polymyx Other (See Comments)    Reaction:  Unknown   . Bacitracin-Polymyxin B Rash  . Celecoxib Itching, Rash and Other (See Comments)  . Petrolatum-Zinc Oxide Hives  . Statins Other (See Comments)    Reaction:  Muscle pain  . Vioxx [Rofecoxib] Itching and Rash    Current Outpatient Prescriptions  Medication Sig Dispense Refill  . ALPRAZolam (XANAX) 0.25 MG tablet Take 0.25 mg by mouth at bedtime as needed.     Marland Kitchen aspirin EC 81 MG tablet Take 81 mg by mouth daily.     . cephALEXin (KEFLEX) 500 MG capsule Take 1 capsule (500 mg total) by mouth 4 (four) times daily. 21 capsule 0  . Cholecalciferol 1000 UNITS tablet Take 1,000 Units by mouth daily.     . clopidogrel (PLAVIX) 75 MG tablet Take 1 tablet  (75 mg total) by mouth daily. 90 tablet 3  . ferrous fumarate (HEMOCYTE - 106 MG FE) 325 (106 FE) MG TABS tablet Take 1 tablet (106 mg of iron total) by mouth daily. 90 each 3  . furosemide (LASIX) 20 MG tablet TAKE 1 TABLET BY MOUTH AS NEEDED FOR EDEMA 30 tablet 3  . levothyroxine (SYNTHROID, LEVOTHROID) 100 MCG tablet TAKE 1 TABLET(100 MCG) BY MOUTH DAILY BEFORE BREAKFAST 30 tablet 0  . metFORMIN (GLUCOPHAGE) 500 MG tablet Take 500 mg by mouth 2 (two) times daily with a meal.    . metoprolol succinate (TOPROL XL) 50 MG 24 hr tablet Take 1 tablet (50 mg total) by mouth  daily. 90 tablet 3  . mupirocin ointment (BACTROBAN) 2 % Apply to affected area 3 times daily 22 g 0  . nitroGLYCERIN (NITROSTAT) 0.4 MG SL tablet PLACE 1 TABLET UNDER THE TONGUE EVERY 5 MINUTES AS NEEDED FOR CHEST PAIN, call 911 if pain is not relieved after 3rd pill 25 tablet 6  . pantoprazole (PROTONIX) 40 MG tablet Take 40 mg by mouth daily.    . potassium chloride (K-DUR) 10 MEQ tablet TAKE 1 TABLET BY MOUTH AS NEEDED. TAKE WITH FUROSEMIDE 30 tablet 3  . ramipril (ALTACE) 10 MG capsule TAKE 1 CAPSULE(10 MG) BY MOUTH DAILY 30 capsule 3  . rosuvastatin (CRESTOR) 5 MG tablet Take 1 tablet (5 mg total) by mouth daily. 90 tablet 3  . Turmeric 500 MG CAPS Take 500 mg by mouth daily.     No current facility-administered medications for this visit.    OBJECTIVE:  Filed Vitals:   05/22/15 1546  BP: 144/77  Pulse: 76  Temp: 97.7 F (36.5 C)  Resp: 18     Body mass index is 27.9 kg/(m^2).    ECOG FS:1 - Symptomatic but completely ambulatory  PHYSICAL EXAM: GENERAL:  Well developed, well nourished, sitting comfortably in the exam room in no acute distress. MENTAL STATUS:  Alert and oriented to person, place and time. HEAD:    Pupils equal round and reactive to light and accomodation.  No conjunctivitis or scleral icterus. ENT:  Oropharynx clear without lesion.  Tongue normal. Mucous membranes moist.  Thyroid: No  enlargement RESPIRATORY:  Clear to auscultation without rales, wheezes or rhonchi. CARDIOVASCULAR:  Regular rate and rhythm without murmur, rub or gallop. BREAST:  Right breast without masses, skin changes or nipple discharge.  Left breast without masses, skin changes or nipple discharge. ABDOMEN:  Soft, non-tender, with active bowel sounds, and no hepatosplenomegaly.  No masses. BACK:  No CVA tenderness.  No tenderness on percussion of the back or rib cage. SKIN:  No rashes, ulcers or lesions. EXTREMITIES: No edema, no skin discoloration or tenderness.  No palpable cords. LYMPH NODES: No palpable cervical, supraclavicular, axillary or inguinal adenopathy  NEUROLOGICAL: Unremarkable. PSYCH:  Appropriate.   LAB RESULTS:  Appointment on 05/22/2015  Component Date Value Ref Range Status  . Sodium 05/22/2015 136  135 - 145 mmol/L Final  . Potassium 05/22/2015 4.4  3.5 - 5.1 mmol/L Final  . Chloride 05/22/2015 107  101 - 111 mmol/L Final  . CO2 05/22/2015 20* 22 - 32 mmol/L Final  . Glucose, Bld 05/22/2015 102* 65 - 99 mg/dL Final  . BUN 05/22/2015 35* 6 - 20 mg/dL Final  . Creatinine, Ser 05/22/2015 0.99  0.44 - 1.00 mg/dL Final  . Calcium 05/22/2015 9.3  8.9 - 10.3 mg/dL Final  . Total Protein 05/22/2015 7.1  6.5 - 8.1 g/dL Final  . Albumin 05/22/2015 4.4  3.5 - 5.0 g/dL Final  . AST 05/22/2015 23  15 - 41 U/L Final  . ALT 05/22/2015 13* 14 - 54 U/L Final  . Alkaline Phosphatase 05/22/2015 74  38 - 126 U/L Final  . Total Bilirubin 05/22/2015 0.7  0.3 - 1.2 mg/dL Final  . GFR calc non Af Amer 05/22/2015 53* >60 mL/min Final  . GFR calc Af Amer 05/22/2015 >60  >60 mL/min Final   Comment: (NOTE) The eGFR has been calculated using the CKD EPI equation. This calculation has not been validated in all clinical situations. eGFR's persistently <60 mL/min signify possible Chronic Kidney Disease.   Marland Kitchen  Anion gap 05/22/2015 9  5 - 15 Final    No results found for:  LABCA2   ASSESSMENT: 82 year old patient with carcinoma of the thyroid T3 disease.  Status post total thyroidectomy Patient had a radioactive iodine therapy on May 5 Hemoglobin is 10.6 TSH is 1.309  Anemia still persistent patient was advised to get upper and lower endoscopy done Ferritin and iron and iron-binding capacity signed within acceptable range.   MEDICAL DECISION MAKING:  Continue same dose of Synthroid All lab data has been reviewed. GI workup has been recommended  Continue same dose of Synthroid.  Reevaluation in 4 months  Thyroid cancer   Staging form: Thyroid - Papillary or Follicular (Under 45 years), AJCC 7th Edition     Clinical: Stage I (T3, N0, M0) - Signed by Forest Gleason, MD on 06/10/2014   Forest Gleason, MD   05/22/2015 4:26 PM

## 2015-05-23 ENCOUNTER — Other Ambulatory Visit: Payer: Self-pay | Admitting: Internal Medicine

## 2015-05-23 ENCOUNTER — Other Ambulatory Visit: Payer: Self-pay

## 2015-05-23 DIAGNOSIS — R600 Localized edema: Secondary | ICD-10-CM

## 2015-05-23 MED ORDER — PANTOPRAZOLE SODIUM 40 MG PO TBEC: 40.0000 mg | DELAYED_RELEASE_TABLET | Freq: Every day | ORAL | Status: DC

## 2015-05-23 NOTE — Telephone Encounter (Signed)
Refill sent for pantoprazole 40 mg  

## 2015-05-24 ENCOUNTER — Other Ambulatory Visit: Payer: Self-pay | Admitting: Internal Medicine

## 2015-05-24 DIAGNOSIS — R600 Localized edema: Secondary | ICD-10-CM

## 2015-05-27 ENCOUNTER — Other Ambulatory Visit: Payer: Self-pay | Admitting: *Deleted

## 2015-05-27 MED ORDER — PANTOPRAZOLE SODIUM 40 MG PO TBEC: 40.0000 mg | DELAYED_RELEASE_TABLET | Freq: Every day | ORAL | Status: DC

## 2015-05-28 ENCOUNTER — Encounter: Payer: Self-pay | Admitting: Oncology

## 2015-05-28 LAB — THYROGLOBULIN LEVEL: Thyroglobulin: <2 ng/mL

## 2015-05-28 LAB — T4: T4, Total: 10.6 ug/dL (ref 4.5–12.0)

## 2015-05-29 ENCOUNTER — Ambulatory Visit
Admission: RE | Admit: 2015-05-29 | Discharge: 2015-05-29 | Disposition: A | Discharge status: Home / Self Care | Payer: Medicare Other | Source: Ambulatory Visit | Attending: Internal Medicine | Admitting: Internal Medicine

## 2015-05-29 DIAGNOSIS — R600 Localized edema: Secondary | ICD-10-CM | POA: Diagnosis not present

## 2015-06-04 ENCOUNTER — Ambulatory Visit
Admission: RE | Admit: 2015-06-04 | Discharge: 2015-06-04 | Disposition: A | Discharge status: Home / Self Care | Payer: Medicare Other | Source: Ambulatory Visit | Attending: Internal Medicine | Admitting: Internal Medicine

## 2015-06-04 DIAGNOSIS — N63 Unspecified lump in unspecified breast: Secondary | ICD-10-CM

## 2015-06-06 ENCOUNTER — Other Ambulatory Visit: Payer: Self-pay | Admitting: Internal Medicine

## 2015-06-06 DIAGNOSIS — N63 Unspecified lump in unspecified breast: Secondary | ICD-10-CM

## 2015-06-08 ENCOUNTER — Other Ambulatory Visit: Payer: Self-pay | Admitting: Oncology

## 2015-06-09 ENCOUNTER — Emergency Department: Payer: Medicare Other

## 2015-06-09 ENCOUNTER — Emergency Department
Admission: EM | Admit: 2015-06-09 | Discharge: 2015-06-09 | Disposition: A | Discharge status: Home / Self Care | Payer: Medicare Other | Attending: Emergency Medicine | Admitting: Emergency Medicine

## 2015-06-09 DIAGNOSIS — W010XXA Fall on same level from slipping, tripping and stumbling without subsequent striking against object, initial encounter: Secondary | ICD-10-CM | POA: Insufficient documentation

## 2015-06-09 DIAGNOSIS — Z7984 Long term (current) use of oral hypoglycemic drugs: Secondary | ICD-10-CM | POA: Insufficient documentation

## 2015-06-09 DIAGNOSIS — S0990XA Unspecified injury of head, initial encounter: Secondary | ICD-10-CM | POA: Diagnosis present

## 2015-06-09 DIAGNOSIS — Z7982 Long term (current) use of aspirin: Secondary | ICD-10-CM | POA: Insufficient documentation

## 2015-06-09 DIAGNOSIS — Y999 Unspecified external cause status: Secondary | ICD-10-CM | POA: Insufficient documentation

## 2015-06-09 DIAGNOSIS — S8000XA Contusion of unspecified knee, initial encounter: Secondary | ICD-10-CM

## 2015-06-09 DIAGNOSIS — Z79899 Other long term (current) drug therapy: Secondary | ICD-10-CM | POA: Diagnosis not present

## 2015-06-09 DIAGNOSIS — S8002XA Contusion of left knee, initial encounter: Secondary | ICD-10-CM | POA: Insufficient documentation

## 2015-06-09 DIAGNOSIS — E785 Hyperlipidemia, unspecified: Secondary | ICD-10-CM | POA: Diagnosis not present

## 2015-06-09 DIAGNOSIS — S8001XA Contusion of right knee, initial encounter: Secondary | ICD-10-CM | POA: Insufficient documentation

## 2015-06-09 DIAGNOSIS — I2510 Atherosclerotic heart disease of native coronary artery without angina pectoris: Secondary | ICD-10-CM | POA: Diagnosis not present

## 2015-06-09 DIAGNOSIS — Z8673 Personal history of transient ischemic attack (TIA), and cerebral infarction without residual deficits: Secondary | ICD-10-CM | POA: Diagnosis not present

## 2015-06-09 DIAGNOSIS — Z85850 Personal history of malignant neoplasm of thyroid: Secondary | ICD-10-CM | POA: Diagnosis not present

## 2015-06-09 DIAGNOSIS — Y929 Unspecified place or not applicable: Secondary | ICD-10-CM | POA: Insufficient documentation

## 2015-06-09 DIAGNOSIS — E039 Hypothyroidism, unspecified: Secondary | ICD-10-CM | POA: Insufficient documentation

## 2015-06-09 DIAGNOSIS — Y939 Activity, unspecified: Secondary | ICD-10-CM | POA: Diagnosis not present

## 2015-06-09 DIAGNOSIS — E119 Type 2 diabetes mellitus without complications: Secondary | ICD-10-CM | POA: Insufficient documentation

## 2015-06-09 DIAGNOSIS — M1990 Unspecified osteoarthritis, unspecified site: Secondary | ICD-10-CM | POA: Insufficient documentation

## 2015-06-09 DIAGNOSIS — S0083XA Contusion of other part of head, initial encounter: Secondary | ICD-10-CM | POA: Diagnosis not present

## 2015-06-09 DIAGNOSIS — I10 Essential (primary) hypertension: Secondary | ICD-10-CM | POA: Insufficient documentation

## 2015-06-09 MED ORDER — ACETAMINOPHEN 325 MG PO TABS
650.0000 mg | ORAL_TABLET | Freq: Once | ORAL | Status: AC
Start: 2015-06-09 — End: 2015-06-09
  Administered 2015-06-09: 650 mg via ORAL
  Filled 2015-06-09: qty 2

## 2015-06-09 NOTE — ED Notes (Signed)
Pt discharged to home.  Family member driving.  Discharge instructions reviewed.  Verbalized understanding.  No questions or concerns at this time.  Teach back verified.  Pt in NAD.  No items left in ED.   

## 2015-06-09 NOTE — ED Notes (Signed)
Pt arrives here via [facility] from h ome  She was walking outside today and she lost her balance resulting in a fall on her patio.  Pt reports that she hit her head either on the patio or on a stone rabbit figurine  Abrasion to her forehead and to her temporal area on the left side of her face   Bilateral knees noted with edema and ecchymosis  Left greater than right  Pt takes plavix daily

## 2015-06-09 NOTE — ED Provider Notes (Signed)
[facility] Emergency Department Provider Note  ____________________________________________   I have reviewed the triage vital signs and the nursing notes.   HISTORY  Chief Complaint Fall    HPI [name] is a 82 y.o. female who is on Plavix states that she tripped. It was definitely a trip. She remembers stumbling, she did not have any chest pain shows breath nausea or vomiting. She did not have any syncopal feelings before after she did not pass out. Patient is able to ambulate but she did bump her head on the patio, as well as both her knees. She denies any nausea or vomiting she denies any significant headache she does not wish any pain medications at this time, she feels in her normal state of health at this time and she felt a normal state of health before she fell.     Past Medical History  Diagnosis Date  . Hyperlipidemia   . Hypertension   . Mild reactive airways disease   . Chronic anxiety   . Esophagitis   . Diverticulosis   . History of colon polyps   . CAD (coronary artery disease)     a. lexiscan 08/2013: nl wall motion, no ischemia, EF 50-55%; b. cardiac cath 05/31/2014: mLAD 90%, dLAD 50%, dLCx 60%, 1st Mrg 80%, pRCA 60% s/p PCI/DES, mRCA 1st lesion 70%, mRCA 2nd 95% s/p PCI/long DES to cover entire mRCA, RPLB 40%;  c. 05/2014 Staged PCI of LAD w/ 2.5 x 15 mm Xience Alpine DES.  Marland Kitchen History of echocardiogram     a. 08/2013: normal LVSF, nl RVSF, no valvular regurgitation or stenosis   . Carotid artery disease ([place])   . PONV (postoperative nausea and vomiting)   . Hypothyroidism   . Thyroid cancer ([place])     a. s/p right sided hemi-thyroidectomy; b. planning for radioactive iodine tx; c. followed by Dr. Marcelene Butte  . Type 2 diabetes mellitus ([place])   . GERD (gastroesophageal reflux disease)   . Osteoarthritis   . Rheumatoid arthritis ([place])   . Stroke [facility])     a. 05/2014 pt c/o garbled speech following cath 5/19. MRI/A 5/26 showed left  caudate infarct->conservative mgmt per neuro.    Patient Active Problem List   Diagnosis Date Noted  . Cellulitis 05/15/2015  . Bilateral leg edema 02/08/2015  . Chronic anxiety 11/22/2014  . Cerebrovascular accident (CVA) ([place]) 07/25/2014  . Arterial vascular disease 07/25/2014  . Swelling of right lower extremity 06/22/2014  . Type 2 diabetes mellitus ([place])   . Stroke ([place]) 06/08/2014  . Anemia 06/07/2014  . Unstable angina ([place]) 06/06/2014  . Carotid artery disease ([place])   . CAD (coronary artery disease)   . Hyperlipidemia   . Hypertension   . Angina pectoris ([place]) 05/31/2014  . SOB (shortness of breath) 04/11/2014  . Palpitations 04/11/2014  . Other fatigue 04/11/2014  . Thyroid cancer ([place]) 04/11/2014    Past Surgical History  Procedure Laterality Date  . Lumbar laminectomy  X 6  . Meniscus repair Bilateral     "2 on the right; 1 on the left"  . Breast implant removal  06/2001  . Abdominal hysterectomy    . Rotator cuff repair      "I think 2 left, 2 right"  . Cholecystectomy    . Ganglion cyst excision Right     AC joint  . Thyroidectomy    . Tonsillectomy    . Appendectomy    . Carpal tunnel release      "  I've have a total of 7" (06/06/2014)  . Back surgery    . Augmentation mammaplasty    . Cataract extraction w/ intraocular lens  implant, bilateral Bilateral   . Tubal ligation    . Coronary angioplasty with stent placement  05/31/2014; 06/06/2014    "2; 1"  . Cardiac catheterization N/A 06/06/2014    Procedure: Coronary/Graft Atherectomy;  Surgeon: Wellington Hampshire, MD;  Location: [place] [facility];  Service: Cardiovascular;  Laterality: N/A;  . Cardiac catheterization N/A 05/31/2014    Procedure: Left Heart Cath;  Surgeon: Minna Merritts, MD;  Location: [place] [facility];  Service: Cardiovascular;  Laterality: N/A;  . Cardiac catheterization N/A 05/31/2014    Procedure: Coronary Stent Intervention;  Surgeon: Wellington Hampshire, MD;  Location: [place]  [facility];  Service: Cardiovascular;  Laterality: N/A;    Current Outpatient Rx  Name  Route  Sig  Dispense  Refill  . ALPRAZolam (XANAX) 0.25 MG tablet   Oral   Take 0.25 mg by mouth at bedtime as needed.          Marland Kitchen aspirin EC 81 MG tablet   Oral   Take 81 mg by mouth daily.          . cephALEXin (KEFLEX) 500 MG capsule   Oral   Take 1 capsule (500 mg total) by mouth 4 (four) times daily.   21 capsule   0   . Cholecalciferol 1000 UNITS tablet   Oral   Take 1,000 Units by mouth daily.          . clopidogrel (PLAVIX) 75 MG tablet   Oral   Take 1 tablet (75 mg total) by mouth daily.   90 tablet   3   . ferrous fumarate (HEMOCYTE - 106 MG FE) 325 (106 FE) MG TABS tablet   Oral   Take 1 tablet (106 mg of iron total) by mouth daily.   90 each   3   . furosemide (LASIX) 20 MG tablet      TAKE 1 TABLET BY MOUTH AS NEEDED FOR EDEMA   30 tablet   3   . levothyroxine (SYNTHROID, LEVOTHROID) 100 MCG tablet      TAKE 1 TABLET(100 MCG) BY MOUTH DAILY BEFORE BREAKFAST   30 tablet   0   . metFORMIN (GLUCOPHAGE) 500 MG tablet   Oral   Take 500 mg by mouth 2 (two) times daily with a meal.         . metoprolol succinate (TOPROL XL) 50 MG 24 hr tablet   Oral   Take 1 tablet (50 mg total) by mouth daily.   90 tablet   3   . mupirocin ointment (BACTROBAN) 2 %      Apply to affected area 3 times daily   22 g   0   . nitroGLYCERIN (NITROSTAT) 0.4 MG SL tablet      PLACE 1 TABLET UNDER THE TONGUE EVERY 5 MINUTES AS NEEDED FOR CHEST PAIN, call 911 if pain is not relieved after 3rd pill   25 tablet   6   . pantoprazole (PROTONIX) 40 MG tablet   Oral   Take 1 tablet (40 mg total) by mouth daily.   30 tablet   3   . potassium chloride (K-DUR) 10 MEQ tablet      TAKE 1 TABLET BY MOUTH AS NEEDED. TAKE WITH FUROSEMIDE   30 tablet   3   . ramipril (ALTACE)  10 MG capsule      TAKE 1 CAPSULE(10 MG) BY MOUTH DAILY   30 capsule   3   . rosuvastatin (CRESTOR) 5  MG tablet   Oral   Take 1 tablet (5 mg total) by mouth daily.   90 tablet   3   . Turmeric 500 MG CAPS   Oral   Take 500 mg by mouth daily.           Allergies Ciprofloxacin; Codeine; Metronidazole; Sulfa antibiotics; Neomycin-bacitracin zn-polymyx; Bacitracin-polymyxin b; Celecoxib; Petrolatum-zinc oxide; Statins; and Vioxx  Family History  Problem Relation Age of Onset  . Heart disease Sister     stent placed x 3     Social History Social History  Substance Use Topics  . Smoking status: Never Smoker   . Smokeless tobacco: Never Used  . Alcohol Use: No    Review of Systems Constitutional: No fever/chills Eyes: No visual changes. ENT: No sore throat. No stiff neck no neck pain Cardiovascular: Denies chest pain. Respiratory: Denies shortness of breath. Gastrointestinal:   no vomiting.  No diarrhea.  No constipation. Genitourinary: Negative for dysuria. Musculoskeletal: Negative lower extremity swelling Skin: Negative for rash. Neurological: Negative for headaches, focal weakness or numbness. 10-point ROS otherwise negative.  ____________________________________________   PHYSICAL EXAM:  VITAL SIGNS: ED Triage Vitals  Enc Vitals Group     BP 06/09/15 1903 138/81 mmHg     Pulse Rate 06/09/15 1903 70     Resp 06/09/15 1903 17     Temp 06/09/15 1903 98 F (36.7 C)     Temp Source 06/09/15 1903 Oral     SpO2 06/09/15 1903 96 %     Weight 06/09/15 1903 155 lb (70.308 kg)     Height 06/09/15 1903 5\' 3"  (1.6 m)     Head Cir --      Peak Flow --      Pain Score 06/09/15 1904 6     Pain Loc --      Pain Edu? --      Excl. in [place]? --     Constitutional: Alert and oriented. Well appearing and in no acute distress. Eyes: Conjunctivae are normal. PERRL. EOMI. Head: Hematoma noted to the forehead. No depressed skull fracture palpated. No significant laceration.. Slight abrasion noted to the left side of the face as well as no instability of the facial  bones Nose: No congestion/rhinnorhea. Mouth/Throat: Mucous membranes are moist.  Oropharynx non-erythematous. Neck: No stridor.   Nontender with no meningismus Cardiovascular: Normal rate, regular rhythm. Grossly normal heart sounds.  Good peripheral circulation. Respiratory: Normal respiratory effort.  No retractions. Lungs CTAB. Abdominal: Soft and nontender. No distention. No guarding no rebound Back:  There is no focal tenderness or step off there is no midline tenderness there are no lesions noted. there is no CVA tenderness Musculoskeletal: Her bilateral hematomas above the patella (the right which is full range of motion no evidence of effusion, no tenderness to palpation to the joint itself, she has no hip pain or ankle pain or tenderness.. No joint effusions, no DVT signs strong distal pulses no edema Neurologic:  Normal speech and language. No gross focal neurologic deficits are appreciated.  Skin:  Skin is warm, dry and intact. No rash noted. Psychiatric: Mood and affect are normal. Speech and behavior are normal.  ____________________________________________   LABS (all labs ordered are listed, but only abnormal results are displayed)  Labs Reviewed - No data to display ____________________________________________  EKG  I personally interpreted any EKGs ordered by me or triage  ____________________________________________  RADIOLOGY  I reviewed any imaging ordered by me or triage that were performed during my shift and, if possible, patient and/or family made aware of any abnormal findings. ____________________________________________   PROCEDURES  Procedure(s) performed: None  Critical Care performed: None  ____________________________________________   INITIAL IMPRESSION / ASSESSMENT AND PLAN / ED COURSE  Pertinent labs & imaging results that were available during my care of the patient were reviewed by me and considered in my medical decision making (see  chart for details).  Patient in her normal state of health with a mechanical fall she is awake and alert no acute distress, we will obtain appropriate imaging giving her age and at this time she would prefer not to have extensive workup and I do not think it is indicated specifically because the patient has had no symptoms of cardiogenic or other syncope or fall. Vital signs are reassuring. ____________________________________________   FINAL CLINICAL IMPRESSION(S) / ED DIAGNOSES  Final diagnoses:  Closed head injury      This chart was dictated using voice recognition software.  Despite best efforts to proofread,  errors can occur which can change meaning.     Schuyler Amor, MD 06/09/15 1929

## 2015-06-11 ENCOUNTER — Emergency Department
Admission: EM | Admit: 2015-06-11 | Discharge: 2015-06-11 | Disposition: A | Discharge status: Home / Self Care | Payer: Medicare Other | Attending: Emergency Medicine | Admitting: Emergency Medicine

## 2015-06-11 ENCOUNTER — Encounter: Payer: Self-pay | Admitting: Medical Oncology

## 2015-06-11 DIAGNOSIS — Z79899 Other long term (current) drug therapy: Secondary | ICD-10-CM | POA: Insufficient documentation

## 2015-06-11 DIAGNOSIS — Z86010 Personal history of colonic polyps: Secondary | ICD-10-CM | POA: Insufficient documentation

## 2015-06-11 DIAGNOSIS — E785 Hyperlipidemia, unspecified: Secondary | ICD-10-CM | POA: Diagnosis not present

## 2015-06-11 DIAGNOSIS — E039 Hypothyroidism, unspecified: Secondary | ICD-10-CM | POA: Insufficient documentation

## 2015-06-11 DIAGNOSIS — Z7984 Long term (current) use of oral hypoglycemic drugs: Secondary | ICD-10-CM | POA: Diagnosis not present

## 2015-06-11 DIAGNOSIS — M1990 Unspecified osteoarthritis, unspecified site: Secondary | ICD-10-CM | POA: Diagnosis not present

## 2015-06-11 DIAGNOSIS — Z85850 Personal history of malignant neoplasm of thyroid: Secondary | ICD-10-CM | POA: Diagnosis not present

## 2015-06-11 DIAGNOSIS — Z8673 Personal history of transient ischemic attack (TIA), and cerebral infarction without residual deficits: Secondary | ICD-10-CM | POA: Insufficient documentation

## 2015-06-11 DIAGNOSIS — Z4801 Encounter for change or removal of surgical wound dressing: Secondary | ICD-10-CM | POA: Diagnosis present

## 2015-06-11 DIAGNOSIS — I2510 Atherosclerotic heart disease of native coronary artery without angina pectoris: Secondary | ICD-10-CM | POA: Diagnosis not present

## 2015-06-11 DIAGNOSIS — Z5189 Encounter for other specified aftercare: Secondary | ICD-10-CM

## 2015-06-11 DIAGNOSIS — S61011D Laceration without foreign body of right thumb without damage to nail, subsequent encounter: Secondary | ICD-10-CM | POA: Diagnosis not present

## 2015-06-11 DIAGNOSIS — W19XXXD Unspecified fall, subsequent encounter: Secondary | ICD-10-CM | POA: Insufficient documentation

## 2015-06-11 DIAGNOSIS — I119 Hypertensive heart disease without heart failure: Secondary | ICD-10-CM | POA: Insufficient documentation

## 2015-06-11 DIAGNOSIS — Z7982 Long term (current) use of aspirin: Secondary | ICD-10-CM | POA: Insufficient documentation

## 2015-06-11 MED ORDER — BACITRACIN ZINC 500 UNIT/GM EX OINT
TOPICAL_OINTMENT | Freq: Two times a day (BID) | CUTANEOUS | Status: DC
  Administered 2015-06-11: 20:00:00 via TOPICAL

## 2015-06-11 NOTE — Discharge Instructions (Signed)
Wound Care °Taking care of your wound properly can help to prevent pain and infection. It can also help your wound to heal more quickly.  °HOW TO CARE FOR YOUR WOUND  °· Take or apply over-the-counter and prescription medicines only as told by your health care provider. °· If you were prescribed antibiotic medicine, take or apply it as told by your health care provider. Do not stop using the antibiotic even if your condition improves. °· Clean the wound each day or as told by your health care provider. °¨ Wash the wound with mild soap and water. °¨ Rinse the wound with water to remove all soap. °¨ Pat the wound dry with a clean towel. Do not rub it. °· There are many different ways to close and cover a wound. For example, a wound can be covered with stitches (sutures), skin glue, or adhesive strips. Follow instructions from your health care provider about: °¨ How to take care of your wound. °¨ When and how you should change your bandage (dressing). °¨ When you should remove your dressing. °¨ Removing whatever was used to close your wound. °· Check your wound every day for signs of infection. Watch for: °¨ Redness, swelling, or pain. °¨ Fluid, blood, or pus. °· Keep the dressing dry until your health care provider says it can be removed. Do not take baths, swim, use a hot tub, or do anything that would put your wound underwater until your health care provider approves. °· Raise (elevate) the injured area above the level of your heart while you are sitting or lying down. °· Do not scratch or pick at the wound. °· Keep all follow-up visits as told by your health care provider. This is important. °SEEK MEDICAL CARE IF: °· You received a tetanus shot and you have swelling, severe pain, redness, or bleeding at the injection site. °· You have a fever. °· Your pain is not controlled with medicine. °· You have increased redness, swelling, or pain at the site of your wound. °· You have fluid, blood, or pus coming from your  wound. °· You notice a bad smell coming from your wound or your dressing. °SEEK IMMEDIATE MEDICAL CARE IF: °· You have a red streak going away from your wound. °  °This information is not intended to replace advice given to you by your health care provider. Make sure you discuss any questions you have with your health care provider. °  °Document Released: 10/08/2007 Document Revised: 05/15/2014 Document Reviewed: 12/25/2013 °Elsevier Interactive Patient Education ©2016 [employer] ° °

## 2015-06-11 NOTE — ED Provider Notes (Signed)
[facility] Emergency Department Provider Note ____________________________________________  Time seen: 1930  I have reviewed the triage vital signs and the nursing notes.  HISTORY  Chief Complaint  Follow-up  HPI [name] is a 82 y.o. female visits to the ED for evaluation of a laceration to her right thumb that she sustained on Sunday. She was seen here following the fall, but did not notify the provider of a superficial laceration to her right thumb that she sustained. She has kept the wound covered with nonstick bandages since that time. She does note some intermittent bleeding through the bandage. She takes Plavix daily and has tried to manage the wound with primarily first aid measures. She presents today for reevaluation due to his continued pain and bleeding from the thumb. She reports pain is currently controlled with Tylenol bleeding is currently stopped. She also notes that her tetanus is greater than 10 years out of date.  Past Medical History  Diagnosis Date  . Hyperlipidemia   . Hypertension   . Mild reactive airways disease   . Chronic anxiety   . Esophagitis   . Diverticulosis   . History of colon polyps   . CAD (coronary artery disease)     a. lexiscan 08/2013: nl wall motion, no ischemia, EF 50-55%; b. cardiac cath 05/31/2014: mLAD 90%, dLAD 50%, dLCx 60%, 1st Mrg 80%, pRCA 60% s/p PCI/DES, mRCA 1st lesion 70%, mRCA 2nd 95% s/p PCI/long DES to cover entire mRCA, RPLB 40%;  c. 05/2014 Staged PCI of LAD w/ 2.5 x 15 mm Xience [place]pine DES.  Marland Kitchen History of echocardiogram     a. 08/2013: normal LVSF, nl RVSF, no valvular regurgitation or stenosis   . Carotid artery disease ([place])   . PONV (postoperative nausea and vomiting)   . Hypothyroidism   . Thyroid cancer ([place])     a. s/p right sided hemi-thyroidectomy; b. planning for radioactive iodine tx; c. followed by Dr. Marcelene [place]  . Type 2 diabetes mellitus ([place])   . GERD (gastroesophageal reflux disease)    . Osteoarthritis   . Rheumatoid arthritis ([place])   . Stroke [facility])     a. 05/2014 pt c/o garbled speech following cath 5/19. MRI/A 5/26 showed left caudate infarct->conservative mgmt per neuro.    Patient Active Problem List   Diagnosis Date Noted  . Cellulitis 05/15/2015  . Bilateral leg edema 02/08/2015  . Chronic anxiety 11/22/2014  . Cerebrovascular accident (CVA) ([place]) 07/25/2014  . Arterial vascular disease 07/25/2014  . Swelling of right lower extremity 06/22/2014  . Type 2 diabetes mellitus ([place])   . Stroke ([place]) 06/08/2014  . Anemia 06/07/2014  . Unstable angina ([place]) 06/06/2014  . Carotid artery disease ([place])   . CAD (coronary artery disease)   . Hyperlipidemia   . Hypertension   . Angina pectoris ([place]) 05/31/2014  . SOB (shortness of breath) 04/11/2014  . Palpitations 04/11/2014  . Other fatigue 04/11/2014  . Thyroid cancer ([place]) 04/11/2014    Past Surgical History  Procedure Laterality Date  . Lumbar laminectomy  X 6  . Meniscus repair Bilateral     "2 on the right; 1 on the left"  . Breast implant removal  06/2001  . Abdominal hysterectomy    . Rotator cuff repair      "I think 2 left, 2 right"  . Cholecystectomy    . Ganglion cyst excision Right     AC joint  . Thyroidectomy    . Tonsillectomy    .  Appendectomy    . Carpal tunnel release      "I've have a total of 7" (06/06/2014)  . Back surgery    . Augmentation mammaplasty    . Cataract extraction w/ intraocular lens  implant, bilateral Bilateral   . Tubal ligation    . Coronary angioplasty with stent placement  05/31/2014; 06/06/2014    "2; 1"  . Cardiac catheterization N/A 06/06/2014    Procedure: Coronary/Graft Atherectomy;  Surgeon: Wellington Hampshire, MD;  Location: [place] [facility];  Service: Cardiovascular;  Laterality: N/A;  . Cardiac catheterization N/A 05/31/2014    Procedure: Left Heart Cath;  Surgeon: Minna Merritts, MD;  Location: [place] [facility];  Service: Cardiovascular;   Laterality: N/A;  . Cardiac catheterization N/A 05/31/2014    Procedure: Coronary Stent Intervention;  Surgeon: Wellington Hampshire, MD;  Location: [place] [facility];  Service: Cardiovascular;  Laterality: N/A;    Current Outpatient Rx  Name  Route  Sig  Dispense  Refill  . ALPRAZolam (XANAX) 0.25 MG tablet   Oral   Take 0.25 mg by mouth at bedtime as needed.          Marland Kitchen aspirin EC 81 MG tablet   Oral   Take 81 mg by mouth daily.          . cephALEXin (KEFLEX) 500 MG capsule   Oral   Take 1 capsule (500 mg total) by mouth 4 (four) times daily.   21 capsule   0   . Cholecalciferol 1000 UNITS tablet   Oral   Take 1,000 Units by mouth daily.          . clopidogrel (PLAVIX) 75 MG tablet   Oral   Take 1 tablet (75 mg total) by mouth daily.   90 tablet   3   . ferrous fumarate (HEMOCYTE - 106 MG FE) 325 (106 FE) MG TABS tablet   Oral   Take 1 tablet (106 mg of iron total) by mouth daily.   90 each   3   . furosemide (LASIX) 20 MG tablet      TAKE 1 TABLET BY MOUTH AS NEEDED FOR EDEMA   30 tablet   3   . levothyroxine (SYNTHROID, LEVOTHROID) 100 MCG tablet      TAKE 1 TABLET(100 MCG) BY MOUTH DAILY BEFORE BREAKFAST   30 tablet   0   . levothyroxine (SYNTHROID, LEVOTHROID) 100 MCG tablet      TAKE 1 TABLET(100 MCG) BY MOUTH DAILY BEFORE BREAKFAST   90 tablet   1   . metFORMIN (GLUCOPHAGE) 500 MG tablet   Oral   Take 500 mg by mouth 2 (two) times daily with a meal.         . metoprolol succinate (TOPROL XL) 50 MG 24 hr tablet   Oral   Take 1 tablet (50 mg total) by mouth daily.   90 tablet   3   . mupirocin ointment (BACTROBAN) 2 %      Apply to affected area 3 times daily   22 g   0   . nitroGLYCERIN (NITROSTAT) 0.4 MG SL tablet      PLACE 1 TABLET UNDER THE TONGUE EVERY 5 MINUTES AS NEEDED FOR CHEST PAIN, call 911 if pain is not relieved after 3rd pill   25 tablet   6   . pantoprazole (PROTONIX) 40 MG tablet   Oral   Take 1 tablet (40 mg  total) by mouth  daily.   30 tablet   3   . potassium chloride (K-DUR) 10 MEQ tablet      TAKE 1 TABLET BY MOUTH AS NEEDED. TAKE WITH FUROSEMIDE   30 tablet   3   . ramipril (ALTACE) 10 MG capsule      TAKE 1 CAPSULE(10 MG) BY MOUTH DAILY   30 capsule   3   . rosuvastatin (CRESTOR) 5 MG tablet   Oral   Take 1 tablet (5 mg total) by mouth daily.   90 tablet   3   . Turmeric 500 MG CAPS   Oral   Take 500 mg by mouth daily.           Allergies Ciprofloxacin; Codeine; Metronidazole; Sulfa antibiotics; Neomycin; Bacitracin-polymyxin b; Celecoxib; Petrolatum-zinc oxide; Statins; and Vioxx  Family History  Problem Relation Age of Onset  . Heart disease Sister     stent placed x 3     Social History Social History  Substance Use Topics  . Smoking status: Never Smoker   . Smokeless tobacco: Never Used  . Alcohol Use: No   Review of Systems  Constitutional: Negative for fever. Musculoskeletal: Negative for back pain. Skin: Negative for rash. right thumb lac as above.  Neurological: Negative for headaches, focal weakness or numbness. ____________________________________________  PHYSICAL EXAM:  VITAL SIGNS: ED Triage Vitals  Enc Vitals Group     BP 06/11/15 1811 171/53 mmHg     Pulse Rate 06/11/15 1811 80     Resp 06/11/15 1811 18     Temp 06/11/15 1811 98.2 F (36.8 C)     Temp Source 06/11/15 1811 Oral     SpO2 06/11/15 1811 100 %     Weight 06/11/15 1811 155 lb (70.308 kg)     Height 06/11/15 1811 5\' 3"  (1.6 m)     Head Cir --      Peak Flow --      Pain Score 06/11/15 1811 4     Pain Loc --      Pain Edu? --      Excl. in [place]? --    Constitutional: Alert and oriented. Well appearing and in no distress. Head: Normocephalic and atraumatic. Cardiovascular: Normal distal pulses and cap refill Respiratory: Normal respiratory effort.  Musculoskeletal: Normal composite fist. Right thumb with superficial laceration with macerated skin flap. No active  bleeding. Nontender with normal range of motion in all extremities.  Neurologic:  Normal gait without ataxia. Normal speech and language. No gross focal neurologic deficits are appreciated. Skin:  Skin is warm, dry and intact. No rash noted. ____________________________________________  PROCEDURES  Bacitracin ointment  Wound dressing ____________________________________________  INITIAL IMPRESSION / ASSESSMENT AND PLAN / ED COURSE  Patient with a superficial right thumb laceration without signs of local infection. The wound is cleansed and dressed here in the ED. Patient is encouraged to continue with daily wound dressing and wound care. She is advised that her wound and of itself does not require a tetanus booster. She will follow-up with her primary care provider for positive for tetanus status at her next annual exam. ____________________________________________  FINAL CLINICAL IMPRESSION(S) / ED DIAGNOSES  Final diagnoses:  Visit for wound check     Melvenia Needles, PA-C 06/11/15 2015  Lisa Roca, MD 06/11/15 2020

## 2015-06-11 NOTE — ED Notes (Signed)
Pt reports that she was seen here Sunday after a fall, here for re-chec. Pt reports that she continues to have pain/bleeding from rt thumb, bleeding controlled at this time. Also reports headache.

## 2015-06-11 NOTE — ED Notes (Signed)
Pt discharged to home.  Family member driving.  Discharge instructions reviewed.  Verbalized understanding.  No questions or concerns at this time.  Teach back verified.  Pt in NAD.  No items left in ED.   

## 2015-06-18 ENCOUNTER — Other Ambulatory Visit: Payer: Self-pay | Admitting: Internal Medicine

## 2015-06-18 DIAGNOSIS — M79605 Pain in left leg: Principal | ICD-10-CM

## 2015-06-18 DIAGNOSIS — M79604 Pain in right leg: Secondary | ICD-10-CM

## 2015-06-19 ENCOUNTER — Ambulatory Visit
Admission: RE | Admit: 2015-06-19 | Discharge: 2015-06-19 | Disposition: A | Discharge status: Home / Self Care | Payer: Medicare Other | Source: Ambulatory Visit | Attending: Internal Medicine | Admitting: Internal Medicine

## 2015-06-19 DIAGNOSIS — M79604 Pain in right leg: Secondary | ICD-10-CM | POA: Diagnosis present

## 2015-06-19 DIAGNOSIS — M79605 Pain in left leg: Secondary | ICD-10-CM | POA: Diagnosis present

## 2015-08-16 ENCOUNTER — Other Ambulatory Visit: Payer: Self-pay | Admitting: Cardiovascular Disease

## 2015-09-10 ENCOUNTER — Other Ambulatory Visit: Payer: Self-pay | Admitting: Oncology

## 2015-09-13 ENCOUNTER — Other Ambulatory Visit: Payer: Self-pay | Admitting: *Deleted

## 2015-09-13 DIAGNOSIS — C73 Malignant neoplasm of thyroid gland: Secondary | ICD-10-CM

## 2015-09-18 ENCOUNTER — Inpatient Hospital Stay: Payer: Medicare Other

## 2015-09-18 ENCOUNTER — Inpatient Hospital Stay: Payer: Medicare Other | Admitting: Internal Medicine

## 2015-09-26 ENCOUNTER — Encounter: Payer: Self-pay | Admitting: Cardiovascular Disease

## 2015-09-26 ENCOUNTER — Ambulatory Visit (INDEPENDENT_AMBULATORY_CARE_PROVIDER_SITE_OTHER): Payer: Medicare Other | Admitting: Cardiovascular Disease

## 2015-09-26 VITALS — BP 110/60 | HR 73 | Ht 63.0 in | Wt 149.5 lb

## 2015-09-26 DIAGNOSIS — R600 Localized edema: Secondary | ICD-10-CM

## 2015-09-26 DIAGNOSIS — R296 Repeated falls: Secondary | ICD-10-CM

## 2015-09-26 DIAGNOSIS — E1159 Type 2 diabetes mellitus with other circulatory complications: Secondary | ICD-10-CM

## 2015-09-26 DIAGNOSIS — I2510 Atherosclerotic heart disease of native coronary artery without angina pectoris: Secondary | ICD-10-CM | POA: Diagnosis not present

## 2015-09-26 DIAGNOSIS — I10 Essential (primary) hypertension: Secondary | ICD-10-CM

## 2015-09-26 DIAGNOSIS — I209 Angina pectoris, unspecified: Secondary | ICD-10-CM

## 2015-09-26 DIAGNOSIS — E785 Hyperlipidemia, unspecified: Secondary | ICD-10-CM | POA: Diagnosis not present

## 2015-09-26 NOTE — Progress Notes (Signed)
Cardiology Office Note  Date:  09/26/2015   ID:  [name], DOB 1933-08-21, MRN [id number]  PCP:  Idelle Crouch, MD   Chief Complaint  Patient presents with  . other    6 month follow up. Meds reviewed by the patient verbally. Pt. took a fall back in May.     HPI:  82 y/o female with coronary artery disease, cardiac cath May 2016 revealing severe LAD and RCA disease,  Percutaneous intervention and drug-eluting stent placement was performed to  the right coronary artery.The LAD was also felt to be amenable for PCI but it was felt that she would require a Rotablator. This was performed later at [facility] Jun 01, 2014 She presents today for routine follow-up for coronary artery disease  In follow-up she reports having had Several falls x 3 Mechanical falls, tripped on a mat, often stepping in the wrong place Very deconditioned, terrible gait instability, walks with a cane  Also reports having problems with gout, uric acid 8.1 On meloxican prn for pain Denies any significant leg swelling  She eats out every day, 3 meals per day, drinks significant amount of water. She watches her weight daily and takes Lasix for any increase in her weight No regular exercise,  Mild chronic shortness of breath on exertion  Lab work reviewed showing HCT 33 HBA1C 6.2 total chol 125 LDL 37  EKG on today's visit shows no sinus rhythm with rate 73 bpm, no significant ST or T-wave changes  Other past medical history episode of garbled speech after PCI and MRI/A of the brain was performed and showed an acute, small, nonhemorrhagic infarct of the left caudate with moderate small vessel disease. She did not have any acute neurologic deficits.   Carotid ultrasound was performed and showed bilateral 1-39% internal carotid artery stenosis.   Other past medical history reviewed Thyroid nodule found, status post ablation, currently on thyroid supplementation This was performed March  2016  PMH:   has a past medical history of CAD (coronary artery disease); Carotid artery disease ([place]); Chronic anxiety; Diverticulosis; Esophagitis; GERD (gastroesophageal reflux disease); History of colon polyps; History of echocardiogram; Hyperlipidemia; Hypertension; Hypothyroidism; Mild reactive airways disease; Osteoarthritis; PONV (postoperative nausea and vomiting); Rheumatoid arthritis ([place]); Stroke [facility]); Thyroid cancer ([place]); and Type 2 diabetes mellitus ([place]).  PSH:    Past Surgical History:  Procedure Laterality Date  . ABDOMINAL HYSTERECTOMY    . APPENDECTOMY    . AUGMENTATION MAMMAPLASTY    . BACK SURGERY    . BREAST IMPLANT REMOVAL  06/2001  . CARDIAC CATHETERIZATION N/A 06/06/2014   Procedure: Coronary/Graft Atherectomy;  Surgeon: Wellington Hampshire, MD;  Location: [place] [facility];  Service: Cardiovascular;  Laterality: N/A;  . CARDIAC CATHETERIZATION N/A 05/31/2014   Procedure: Left Heart Cath;  Surgeon: Minna Merritts, MD;  Location: [place] [facility];  Service: Cardiovascular;  Laterality: N/A;  . CARDIAC CATHETERIZATION N/A 05/31/2014   Procedure: Coronary Stent Intervention;  Surgeon: Wellington Hampshire, MD;  Location: [place] [facility];  Service: Cardiovascular;  Laterality: N/A;  . CARPAL TUNNEL RELEASE     "I've have a total of 7" (06/06/2014)  . CATARACT EXTRACTION W/ INTRAOCULAR LENS  IMPLANT, BILATERAL Bilateral   . CHOLECYSTECTOMY    . CORONARY ANGIOPLASTY WITH STENT PLACEMENT  05/31/2014; 06/06/2014   "2; 1"  . GANGLION CYST EXCISION Right    AC joint  . LUMBAR LAMINECTOMY  X 6  . MENISCUS REPAIR Bilateral    "  2 on the right; 1 on the left"  . ROTATOR CUFF REPAIR     "I think 2 left, 2 right"  . THYROIDECTOMY    . TONSILLECTOMY    . TUBAL LIGATION      Current Outpatient Prescriptions  Medication Sig Dispense Refill  . allopurinol (ZYLOPRIM) 100 MG tablet Take 100 mg by mouth 2 (two) times daily.    Marland Kitchen ALPRAZolam (XANAX) 0.25 MG tablet Take 0.25 mg  by mouth at bedtime as needed.     Marland Kitchen aspirin EC 81 MG tablet Take 81 mg by mouth daily.     . cephALEXin (KEFLEX) 500 MG capsule Take 1 capsule (500 mg total) by mouth 4 (four) times daily. 21 capsule 0  . Cholecalciferol 1000 UNITS tablet Take 1,000 Units by mouth daily.     . clopidogrel (PLAVIX) 75 MG tablet Take 1 tablet (75 mg total) by mouth daily. 90 tablet 3  . Colchicine (MITIGARE) 0.6 MG CAPS Take 0.6 mg by mouth 2 (two) times daily.    . ferrous fumarate (HEMOCYTE - 106 MG FE) 325 (106 FE) MG TABS tablet Take 1 tablet (106 mg of iron total) by mouth daily. 90 each 3  . furosemide (LASIX) 20 MG tablet TAKE 1 TABLET BY MOUTH AS NEEDED FOR EDEMA 30 tablet 3  . levothyroxine (SYNTHROID, LEVOTHROID) 100 MCG tablet TAKE 1 TABLET(100 MCG) BY MOUTH DAILY BEFORE BREAKFAST 30 tablet 0  . metFORMIN (GLUCOPHAGE) 500 MG tablet Take 500 mg by mouth 2 (two) times daily with a meal.    . metoprolol succinate (TOPROL XL) 50 MG 24 hr tablet Take 1 tablet (50 mg total) by mouth daily. 90 tablet 3  . mupirocin ointment (BACTROBAN) 2 % Apply to affected area 3 times daily 22 g 0  . nitroGLYCERIN (NITROSTAT) 0.4 MG SL tablet PLACE 1 TABLET UNDER THE TONGUE EVERY 5 MINUTES AS NEEDED FOR CHEST PAIN 25 tablet 1  . pantoprazole (PROTONIX) 40 MG tablet Take 1 tablet (40 mg total) by mouth daily. 30 tablet 3  . potassium chloride (K-DUR) 10 MEQ tablet TAKE 1 TABLET BY MOUTH AS NEEDED. TAKE WITH FUROSEMIDE 30 tablet 3  . ramipril (ALTACE) 10 MG capsule TAKE 1 CAPSULE(10 MG) BY MOUTH DAILY 30 capsule 3  . rosuvastatin (CRESTOR) 5 MG tablet Take 1 tablet (5 mg total) by mouth daily. 90 tablet 3  . Turmeric 500 MG CAPS Take 500 mg by mouth daily.     No current facility-administered medications for this visit.      Allergies:   Ciprofloxacin; Codeine; Metronidazole; Sulfa antibiotics; Neomycin; Bacitracin-polymyxin b; Celecoxib; Petrolatum-zinc oxide; Statins; and Vioxx [rofecoxib]   Social History:  The patient   reports that she has never smoked. She has never used smokeless tobacco. She reports that she does not drink alcohol or use drugs.   Family History:   family history includes Heart disease in her sister.    Review of Systems: Review of Systems  Respiratory: Negative.   Cardiovascular: Negative.   Gastrointestinal: Negative.   Musculoskeletal: Positive for falls.       Gait instability  Neurological: Positive for weakness.  Psychiatric/Behavioral: Negative.   All other systems reviewed and are negative.    PHYSICAL EXAM: VS:  BP 110/60 (BP Location: Left Arm, Patient Position: Sitting, Cuff Size: Normal)   Pulse 73   Ht 5\' 3"  (1.6 m)   Wt 149 lb 8 oz (67.8 kg)   BMI 26.48 kg/m  , BMI Body mass  index is 26.48 kg/m. GEN: Well nourished, well developed, in no acute distress , frail, walks with a cane Difficulty transitioning from chair to table, required assistance HEENT: normal  Neck: no JVD, carotid bruits, or masses Cardiac: RRR; no murmurs, rubs, or gallops,no edema  Respiratory:  clear to auscultation bilaterally, normal work of breathing GI: soft, nontender, nondistended, + BS MS: no deformity or atrophy  Skin: warm and dry, no rash Neuro:  Strength and sensation are intact Psych: euthymic mood, full affect    Recent Labs: 05/22/2015: ALT 13; BUN 35; Creatinine, Ser 0.99; Hemoglobin 10.4; Platelets 243; Potassium 4.4; Sodium 136; TSH 1.309    Lipid Panel Lab Results  Component Value Date   CHOL 151 08/14/2014   HDL 42 08/14/2014   LDLCALC 67 08/14/2014   TRIG 209 (H) 08/14/2014      Wt Readings from Last 3 Encounters:  09/26/15 149 lb 8 oz (67.8 kg)  06/11/15 155 lb (70.3 kg)  06/09/15 155 lb (70.3 kg)       ASSESSMENT AND PLAN:  Angina pectoris ([place]) - Plan: EKG 12-Lead Currently with no symptoms of angina. No further workup at this time. Continue current medication regimen.  Coronary artery disease involving native coronary artery of native heart  without angina pectoris - Plan: EKG 12-Lead  Essential hypertension - Plan: EKG 12-Lead Blood pressure is well controlled on today's visit. No changes made to the medications.  Hyperlipidemia Cholesterol is at goal on the current lipid regimen. No changes to the medications were made.  Type 2 diabetes mellitus with other circulatory complication, without long-term current use of insulin ([place]) Lab work results discussed with her, hemoglobin A1c relatively well-controlled Poor diet, eats out frequently  Bilateral leg edema Minimal leg edema on today's visit, recommended she take extra Lasix as needed for swelling  Falls frequently Long discussion concerning recent falls, trauma to her eye on multiple occasions, Preventive measures to decrease fall risk, Exercises she can participate in, programs that she can join for leg strengthening   Total encounter time more than 25 minutes  Greater than 50% was spent in counseling and coordination of care with the patient   Disposition:   F/U  6 months   Orders Placed This Encounter  Procedures  . EKG 12-Lead     Signed, [place] Plants, M.D., Ph.D. 09/26/2015  [place], [place] \

## 2015-09-26 NOTE — Patient Instructions (Addendum)

## 2015-10-01 ENCOUNTER — Telehealth: Payer: Self-pay | Admitting: Cardiovascular Disease

## 2015-10-01 ENCOUNTER — Other Ambulatory Visit: Payer: Self-pay | Admitting: *Deleted

## 2015-10-01 MED ORDER — NITROGLYCERIN 0.4 MG SL SUBL: SUBLINGUAL_TABLET | SUBLINGUAL | 2 refills | Status: DC

## 2015-10-01 NOTE — Telephone Encounter (Signed)
°*  STAT* If patient is at the pharmacy, call can be transferred to refill team.   1. Which medications need to be refilled? (please list name of each medication and dose if known) Nitro  0.4 mg SL tab   2. Which pharmacy/location (including street and city if local pharmacy) is medication to be sent to? [place]    3. Do they need a 30 day or 90 day supply? [place]

## 2015-10-01 NOTE — Telephone Encounter (Signed)
Nitro 0.4 mg #25 R#2 sent to [facility].

## 2015-10-09 ENCOUNTER — Ambulatory Visit: Payer: Medicare Other

## 2015-10-09 ENCOUNTER — Other Ambulatory Visit: Payer: Medicare Other

## 2015-10-21 ENCOUNTER — Encounter: Payer: Self-pay | Admitting: Internal Medicine

## 2015-10-21 ENCOUNTER — Inpatient Hospital Stay: Payer: Medicare Other

## 2015-10-21 ENCOUNTER — Inpatient Hospital Stay: Payer: Medicare Other | Attending: Internal Medicine | Admitting: Internal Medicine

## 2015-10-21 VITALS — BP 133/67 | HR 72 | Temp 97.3°F | Resp 17 | Ht 63.0 in | Wt 152.0 lb

## 2015-10-21 DIAGNOSIS — N939 Abnormal uterine and vaginal bleeding, unspecified: Secondary | ICD-10-CM

## 2015-10-21 DIAGNOSIS — Z87820 Personal history of traumatic brain injury: Secondary | ICD-10-CM | POA: Diagnosis not present

## 2015-10-21 DIAGNOSIS — E785 Hyperlipidemia, unspecified: Secondary | ICD-10-CM | POA: Diagnosis not present

## 2015-10-21 DIAGNOSIS — F419 Anxiety disorder, unspecified: Secondary | ICD-10-CM | POA: Diagnosis not present

## 2015-10-21 DIAGNOSIS — K219 Gastro-esophageal reflux disease without esophagitis: Secondary | ICD-10-CM | POA: Diagnosis not present

## 2015-10-21 DIAGNOSIS — C73 Malignant neoplasm of thyroid gland: Secondary | ICD-10-CM

## 2015-10-21 DIAGNOSIS — E119 Type 2 diabetes mellitus without complications: Secondary | ICD-10-CM | POA: Diagnosis not present

## 2015-10-21 DIAGNOSIS — I10 Essential (primary) hypertension: Secondary | ICD-10-CM | POA: Diagnosis not present

## 2015-10-21 DIAGNOSIS — M1990 Unspecified osteoarthritis, unspecified site: Secondary | ICD-10-CM | POA: Insufficient documentation

## 2015-10-21 DIAGNOSIS — I2510 Atherosclerotic heart disease of native coronary artery without angina pectoris: Secondary | ICD-10-CM | POA: Diagnosis not present

## 2015-10-21 DIAGNOSIS — D649 Anemia, unspecified: Secondary | ICD-10-CM

## 2015-10-21 DIAGNOSIS — E890 Postprocedural hypothyroidism: Secondary | ICD-10-CM | POA: Diagnosis not present

## 2015-10-21 DIAGNOSIS — Z79899 Other long term (current) drug therapy: Secondary | ICD-10-CM

## 2015-10-21 LAB — CBC WITH DIFFERENTIAL/PLATELET
BASOS ABS: 0.1 10*3/uL (ref 0–0.1)
BASOS PCT: 1 %
Eosinophils Absolute: 0.1 10*3/uL (ref 0–0.7)
Eosinophils Relative: 2 %
HEMATOCRIT: 30.4 % — AB (ref 35.0–47.0)
HEMOGLOBIN: 10.2 g/dL — AB (ref 12.0–16.0)
Lymphocytes Relative: 15 %
Lymphs Abs: 1.1 10*3/uL (ref 1.0–3.6)
MCH: 28.7 pg (ref 26.0–34.0)
MCHC: 33.6 g/dL (ref 32.0–36.0)
MCV: 85.2 fL (ref 80.0–100.0)
MONO ABS: 0.6 10*3/uL (ref 0.2–0.9)
Monocytes Relative: 8 %
NEUTROS ABS: 5.6 10*3/uL (ref 1.4–6.5)
NEUTROS PCT: 74 %
Platelets: 196 10*3/uL (ref 150–440)
RBC: 3.57 MIL/uL — ABNORMAL LOW (ref 3.80–5.20)
RDW: 17.4 % — AB (ref 11.5–14.5)
WBC: 7.5 10*3/uL (ref 3.6–11.0)

## 2015-10-21 LAB — COMPREHENSIVE METABOLIC PANEL
ALBUMIN: 4.1 g/dL (ref 3.5–5.0)
ALT: 16 U/L (ref 14–54)
ANION GAP: 7 (ref 5–15)
AST: 25 U/L (ref 15–41)
Alkaline Phosphatase: 77 U/L (ref 38–126)
BILIRUBIN TOTAL: 0.4 mg/dL (ref 0.3–1.2)
BUN: 22 mg/dL — ABNORMAL HIGH (ref 6–20)
CALCIUM: 8.9 mg/dL (ref 8.9–10.3)
CO2: 23 mmol/L (ref 22–32)
Chloride: 108 mmol/L (ref 101–111)
Creatinine, Ser: 0.74 mg/dL (ref 0.44–1.00)
GFR calc non Af Amer: >60 mL/min (ref >60)
GLUCOSE: 103 mg/dL — AB (ref 65–99)
POTASSIUM: 4.3 mmol/L (ref 3.5–5.1)
Sodium: 138 mmol/L (ref 135–145)
TOTAL PROTEIN: 6.9 g/dL (ref 6.5–8.1)

## 2015-10-21 NOTE — Progress Notes (Signed)
Pt reports feeling fatigued always.  Had a fall in May and then four weeks later.  Pt seen in ER reports a sensation like a headache on right side and possibly swollen glands.

## 2015-10-21 NOTE — Progress Notes (Signed)
[name] OFFICE PROGRESS NOTE  Patient Care Team: Idelle Crouch, MD as PCP - General (Internal Medicine) Minna Merritts, MD as Consulting Physician (Cardiology)  Thyroid cancer [facility])   Staging form: Thyroid - Papillary or Follicular (45 years and older), AJCC 7th Edition   - Clinical stage from 03/22/2014: Stage III (T3, N0, M0) - Signed by Cammie Sickle, MD on 10/22/2015   Staging form: Thyroid - Papillary or Follicular (Under 45 years), AJCC 7th Edition   - Pathologic: No stage assigned - Unsigned   Oncology History   1. 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  0.5 cm tumor extending into perithyroid soft tissue status post total thyroidectomy;   One lymph node was negative.  No angiolymphatic invasion seen [Dr.McQueen]; Intermediate risk of recurrence- goal TSH 0.1- 0.5  # Status post radioactive iodine therapy in May of 2016.  # On Synthroid      Thyroid cancer [facility])     This is my first interaction with the patient as patient's primary oncologist has been Dr.Choksi. I reviewed the patient's prior charts/pertinent labs/imaging in detail; findings are summarized above.     INTERVAL HISTORY:  [name] 82 y.o.  female pleasant patient above history of Stage I papillary thyroid carcinoma is here for follow-up.  Patient denies any neck swelling. However noted to have a fall mechanical approximately 3-4 weeks ago. Noted to have some neck pain however improving.   Patient also notes to have vaginal bleeding on and off for the last 2 years. Not getting any better or worse. Denies any weight loss. Denies any lumps or bumps. Chronic mild fatigue but otherwise no shortness of breath or chest pain. No cough.  REVIEW OF SYSTEMS:  A complete 10 point review of system is done which is negative except mentioned above/history of present illness.   PAST MEDICAL HISTORY :  Past Medical History:   Diagnosis Date  . CAD (coronary artery disease)    a. lexiscan 08/2013: nl wall motion, no ischemia, EF 50-55%; b. cardiac cath 05/31/2014: mLAD 90%, dLAD 50%, dLCx 60%, 1st Mrg 80%, pRCA 60% s/p PCI/DES, mRCA 1st lesion 70%, mRCA 2nd 95% s/p PCI/long DES to cover entire mRCA, RPLB 40%;  c. 05/2014 Staged PCI of LAD w/ 2.5 x 15 mm Xience Alpine DES.  . Carotid artery disease ([place])   . Chronic anxiety   . Diverticulosis   . Esophagitis   . GERD (gastroesophageal reflux disease)   . History of colon polyps   . History of echocardiogram    a. 08/2013: normal LVSF, nl RVSF, no valvular regurgitation or stenosis   . Hyperlipidemia   . Hypertension   . Hypothyroidism   . Mild reactive airways disease   . Osteoarthritis   . PONV (postoperative nausea and vomiting)   . Rheumatoid arthritis ([place])   . Stroke [facility])    a. 05/2014 pt c/o garbled speech following cath 5/19. MRI/A 5/26 showed left caudate infarct->conservative mgmt per neuro.  . Thyroid cancer ([place])    a. s/p right sided hemi-thyroidectomy; b. planning for radioactive iodine tx; c. followed by Dr. Marcelene Butte  . Type 2 diabetes mellitus ([place])     PAST SURGICAL HISTORY :   Past Surgical History:  Procedure Laterality Date  . ABDOMINAL HYSTERECTOMY    . APPENDECTOMY    . AUGMENTATION MAMMAPLASTY    . BACK SURGERY    . BREAST IMPLANT REMOVAL  06/2001  . CARDIAC CATHETERIZATION N/A 06/06/2014   Procedure: Coronary/Graft Atherectomy;  Surgeon: Wellington Hampshire, MD;  Location: [place] [facility];  Service: Cardiovascular;  Laterality: N/A;  . CARDIAC CATHETERIZATION N/A 05/31/2014   Procedure: Left Heart Cath;  Surgeon: Minna Merritts, MD;  Location: [place] CV LAB;  Service: Cardiovascular;  Laterality: N/A;  . CARDIAC CATHETERIZATION N/A 05/31/2014   Procedure: Coronary Stent Intervention;  Surgeon: Wellington Hampshire, MD;  Location: [place] CV LAB;  Service: Cardiovascular;  Laterality: N/A;  . CARPAL TUNNEL RELEASE     "I've  have a total of 7" (06/06/2014)  . CATARACT EXTRACTION W/ INTRAOCULAR LENS  IMPLANT, BILATERAL Bilateral   . CHOLECYSTECTOMY    . CORONARY ANGIOPLASTY WITH STENT PLACEMENT  05/31/2014; 06/06/2014   "2; 1"  . GANGLION CYST EXCISION Right    AC joint  . LUMBAR LAMINECTOMY  X 6  . MENISCUS REPAIR Bilateral    "2 on the right; 1 on the left"  . ROTATOR CUFF REPAIR     "I think 2 left, 2 right"  . THYROIDECTOMY    . TONSILLECTOMY    . TUBAL LIGATION      FAMILY HISTORY :   Family History  Problem Relation Age of Onset  . Heart disease Sister     stent placed x 3     SOCIAL HISTORY:   Social History  Substance Use Topics  . Smoking status: Never Smoker  . Smokeless tobacco: Never Used  . Alcohol use No    ALLERGIES:  is allergic to ciprofloxacin; codeine; metronidazole; sulfa antibiotics; neomycin; bacitracin-polymyxin b; celecoxib; petrolatum-zinc oxide; statins; and vioxx [rofecoxib].  MEDICATIONS:  Current Outpatient Prescriptions  Medication Sig Dispense Refill  . allopurinol (ZYLOPRIM) 100 MG tablet Take 100 mg by mouth 2 (two) times daily.    Marland Kitchen ALPRAZolam (XANAX) 0.25 MG tablet Take 0.25 mg by mouth at bedtime as needed.     Marland Kitchen aspirin EC 81 MG tablet Take 81 mg by mouth daily.     . Cholecalciferol 1000 UNITS tablet Take 1,000 Units by mouth daily.     . clopidogrel (PLAVIX) 75 MG tablet Take 1 tablet (75 mg total) by mouth daily. 90 tablet 3  . Colchicine (MITIGARE) 0.6 MG CAPS Take 0.6 mg by mouth 2 (two) times daily.    . ferrous fumarate (HEMOCYTE - 106 MG FE) 325 (106 FE) MG TABS tablet Take 1 tablet (106 mg of iron total) by mouth daily. 90 each 3  . furosemide (LASIX) 20 MG tablet TAKE 1 TABLET BY MOUTH AS NEEDED FOR EDEMA 30 tablet 3  . levothyroxine (SYNTHROID, LEVOTHROID) 100 MCG tablet TAKE 1 TABLET(100 MCG) BY MOUTH DAILY BEFORE BREAKFAST 30 tablet 0  . metFORMIN (GLUCOPHAGE) 500 MG tablet Take 500 mg by mouth 2 (two) times daily with a meal.    . metoprolol  succinate (TOPROL XL) 50 MG 24 hr tablet Take 1 tablet (50 mg total) by mouth daily. 90 tablet 3  . mupirocin ointment (BACTROBAN) 2 % Apply to affected area 3 times daily 22 g 0  . nitroGLYCERIN (NITROSTAT) 0.4 MG SL tablet PLACE 1 TABLET UNDER THE TONGUE EVERY 5 MINUTES AS NEEDED FOR CHEST PAIN 25 tablet 2  . pantoprazole (PROTONIX) 40 MG tablet Take 1 tablet (40 mg total) by mouth daily. 30 tablet 3  . potassium chloride (K-DUR) 10 MEQ tablet TAKE 1 TABLET BY MOUTH AS NEEDED. TAKE WITH FUROSEMIDE 30 tablet 3  . ramipril (  ALTACE) 10 MG capsule TAKE 1 CAPSULE(10 MG) BY MOUTH DAILY 30 capsule 3  . rosuvastatin (CRESTOR) 5 MG tablet Take 1 tablet (5 mg total) by mouth daily. 90 tablet 3  . Turmeric 500 MG CAPS Take 500 mg by mouth daily.     No current facility-administered medications for this visit.     PHYSICAL EXAMINATION: ECOG PERFORMANCE STATUS: 0 - Asymptomatic  BP 133/67 (BP Location: Right Arm, Patient Position: Sitting)   Pulse 72   Temp 97.3 F (36.3 C) (Tympanic)   Resp 17   Ht 5\' 3"  (1.6 m)   Wt 152 lb (68.9 kg)   BMI 26.93 kg/m   Filed Weights   10/21/15 1459  Weight: 152 lb (68.9 kg)    GENERAL: Well-nourished well-developed; Alert, no distress and comfortable.   Alone. Walks with a cane. EYES: no pallor or icterus OROPHARYNX: no thrush or ulceration; good dentition  NECK: supple, no masses felt LYMPH:  no palpable lymphadenopathy in the cervical, axillary or inguinal regions LUNGS: clear to auscultation and  No wheeze or crackles HEART/CVS: regular rate & rhythm and no murmurs; No lower extremity edema ABDOMEN:abdomen soft, non-tender and normal bowel sounds Musculoskeletal:no cyanosis of digits and no clubbing  PSYCH: alert & oriented x 3 with fluent speech NEURO: no focal motor/sensory deficits SKIN:  no rashes or significant lesions  LABORATORY DATA:  I have reviewed the data as listed    Component Value Date/Time   NA 138 10/21/2015 1350   NA 139  05/04/2014 1058   K 4.3 10/21/2015 1350   K 4.5 05/04/2014 1058   CL 108 10/21/2015 1350   CL 105 05/04/2014 1058   CO2 23 10/21/2015 1350   CO2 26 05/04/2014 1058   GLUCOSE 103 (H) 10/21/2015 1350   GLUCOSE 161 (H) 05/04/2014 1058   BUN 22 (H) 10/21/2015 1350   BUN 20 05/04/2014 1058   CREATININE 0.74 10/21/2015 1350   CREATININE 1.10 (H) 05/04/2014 1058   CALCIUM 8.9 10/21/2015 1350   CALCIUM 9.5 05/04/2014 1058   PROT 6.9 10/21/2015 1350   PROT 6.9 05/04/2014 1058   ALBUMIN 4.1 10/21/2015 1350   ALBUMIN 4.1 05/04/2014 1058   AST 25 10/21/2015 1350   AST 26 05/04/2014 1058   ALT 16 10/21/2015 1350   ALT 17 05/04/2014 1058   ALKPHOS 77 10/21/2015 1350   ALKPHOS 52 05/04/2014 1058   BILITOT 0.4 10/21/2015 1350   BILITOT 0.6 05/04/2014 1058   GFRNONAA >60 10/21/2015 1350   GFRNONAA 48 (L) 05/04/2014 1058   GFRAA >60 10/21/2015 1350   GFRAA 56 (L) 05/04/2014 1058    No results found for: SPEP, UPEP  Lab Results  Component Value Date   WBC 7.5 10/21/2015   NEUTROABS 5.6 10/21/2015   HGB 10.2 (L) 10/21/2015   HCT 30.4 (L) 10/21/2015   MCV 85.2 10/21/2015   PLT 196 10/21/2015      Chemistry      Component Value Date/Time   NA 138 10/21/2015 1350   NA 139 05/04/2014 1058   K 4.3 10/21/2015 1350   K 4.5 05/04/2014 1058   CL 108 10/21/2015 1350   CL 105 05/04/2014 1058   CO2 23 10/21/2015 1350   CO2 26 05/04/2014 1058   BUN 22 (H) 10/21/2015 1350   BUN 20 05/04/2014 1058   CREATININE 0.74 10/21/2015 1350   CREATININE 1.10 (H) 05/04/2014 1058      Component Value Date/Time   CALCIUM 8.9 10/21/2015 1350  CALCIUM 9.5 05/04/2014 1058   ALKPHOS 77 10/21/2015 1350   ALKPHOS 52 05/04/2014 1058   AST 25 10/21/2015 1350   AST 26 05/04/2014 1058   ALT 16 10/21/2015 1350   ALT 17 05/04/2014 1058   BILITOT 0.4 10/21/2015 1350   BILITOT 0.6 05/04/2014 1058       RADIOGRAPHIC STUDIES: I have personally reviewed the radiological images as listed and agreed with  the findings in the report. No results found.   ASSESSMENT & PLAN:  Thyroid cancer ([place]) Thyroid cancer- stage III status post resection followed by radioactive iodine; patient's Synthroid.   # Hypothyroidism /TSH suppression -currently on Synthoid 75 mcg/ [from 100 mcg appx 2 months; Dr.Sparks]; patient's goal TSH for intermediate risk- 0.1-0.5; however given her age I think it's reasonable to continue the current dose. TSH 1.97.   # vaginal bleeding- x2 years; recommend gynecology- once a month [had hysterectomy-30 years ago.]recommend gynecology.  # Mild anemia hemoglobin 10 chronic- question if related above.  # follow up in  7months/labs-thyroid tumor marker/labs/thyroid profile-few days prior.   # 25 minutes face-to-face with the patient discussing the above plan of care; more than 50% of time spent on prognosis/ natural history; counseling and coordination.   Orders Placed This Encounter  Procedures  . TgAb+Thyroglobulin IMA or RIA    Standing Status:   Future    Standing Expiration Date:   11/24/2016  . Thyroid Panel With TSH    Standing Status:   Future    Standing Expiration Date:   11/24/2016  . CBC with Differential/Platelet    Standing Status:   Future    Standing Expiration Date:   11/24/2016  . Comprehensive metabolic panel    Standing Status:   Future    Standing Expiration Date:   11/24/2016  . Iron and TIBC    Standing Status:   Future    Standing Expiration Date:   11/24/2016  . Ferritin    Standing Status:   Future    Standing Expiration Date:   11/24/2016  . Ambulatory referral to Gynecology    Referral Priority:   Routine    Referral Type:   Consultation    Referral Reason:   Specialty Services Required    Referred to Provider:   Maceo Pro, MD    Requested Specialty:   Obstetrics and Gynecology    Number of Visits Requested:   1   All questions were answered. The patient knows to call the clinic with any problems, questions or concerns.       Cammie Sickle, MD 10/22/2015 8:12 AM

## 2015-10-21 NOTE — Assessment & Plan Note (Addendum)
Thyroid cancer- stage III status post resection followed by radioactive iodine; patient's Synthroid.   # Hypothyroidism /TSH suppression -currently on Synthoid 75 mcg/ [from 100 mcg appx 2 months; Dr.Sparks]; patient's goal TSH for intermediate risk- 0.1-0.5; however given her age I think it's reasonable to continue the current dose. TSH 1.97.   # vaginal bleeding- x2 years; recommend gynecology- once a month [had hysterectomy-30 years ago.]recommend gynecology.  # Mild anemia hemoglobin 10 chronic- question if related above.  # follow up in  37months/labs-thyroid tumor marker/labs/thyroid profile-few days prior.   # 25 minutes face-to-face with the patient discussing the above plan of care; more than 50% of time spent on prognosis/ natural history; counseling and coordination.

## 2015-10-22 LAB — THYROID PANEL WITH TSH
Free Thyroxine Index: 1.9 (ref 1.2–4.9)
T3 UPTAKE RATIO: 26 % (ref 24–39)
T4 TOTAL: 7.2 ug/dL (ref 4.5–12.0)
TSH: 1.97 u[IU]/mL (ref 0.450–4.500)

## 2015-10-23 ENCOUNTER — Telehealth: Payer: Self-pay | Admitting: *Deleted

## 2015-10-23 NOTE — Telephone Encounter (Signed)
-----   Message from Cammie Sickle, MD sent at 10/22/2015  5:27 PM EDT ----- Recommend continued dose of synthroid. Please inform pt.

## 2015-10-24 NOTE — Telephone Encounter (Signed)
Left msg on patient's voice mail. Keep same synthroid dosing. Asked patient to contact cancer center to confirm that vm msg was rcvd.

## 2015-10-28 ENCOUNTER — Other Ambulatory Visit: Payer: Self-pay | Admitting: Cardiovascular Disease

## 2015-10-29 ENCOUNTER — Ambulatory Visit
Admission: RE | Admit: 2015-10-29 | Discharge: 2015-10-29 | Disposition: A | Discharge status: Home / Self Care | Payer: Medicare Other | Source: Ambulatory Visit | Attending: Internal Medicine | Admitting: Internal Medicine

## 2015-10-29 DIAGNOSIS — N63 Unspecified lump in unspecified breast: Secondary | ICD-10-CM

## 2015-11-24 ENCOUNTER — Other Ambulatory Visit: Payer: Self-pay | Admitting: Cardiovascular Disease

## 2015-11-27 ENCOUNTER — Encounter (INDEPENDENT_AMBULATORY_CARE_PROVIDER_SITE_OTHER): Payer: Self-pay | Admitting: Orthopedic Surgery

## 2015-11-27 ENCOUNTER — Ambulatory Visit (INDEPENDENT_AMBULATORY_CARE_PROVIDER_SITE_OTHER): Payer: Medicare Other | Admitting: Family

## 2015-11-27 ENCOUNTER — Other Ambulatory Visit: Payer: Self-pay | Admitting: Cardiovascular Disease

## 2015-11-27 VITALS — Ht 63.0 in | Wt 152.0 lb

## 2015-11-27 DIAGNOSIS — M159 Polyosteoarthritis, unspecified: Secondary | ICD-10-CM | POA: Diagnosis not present

## 2015-11-27 DIAGNOSIS — M1009 Idiopathic gout, multiple sites: Secondary | ICD-10-CM | POA: Insufficient documentation

## 2015-11-27 DIAGNOSIS — M1A09X0 Idiopathic chronic gout, multiple sites, without tophus (tophi): Secondary | ICD-10-CM

## 2015-11-27 LAB — URIC ACID: URIC ACID, SERUM: 4.6 mg/dL (ref 2.5–7.0)

## 2015-11-27 MED ORDER — COLCHICINE 0.6 MG PO CAPS: 0.6000 mg | ORAL_CAPSULE | Freq: Two times a day (BID) | ORAL | 2 refills | Status: DC

## 2015-11-27 MED ORDER — MELOXICAM 7.5 MG PO TABS: 7.5000 mg | ORAL_TABLET | Freq: Every day | ORAL | 1 refills | Status: DC | PRN

## 2015-11-27 NOTE — Addendum Note (Signed)
Addended by: Mariana Arn L on: 11/27/2015 05:00 PM   Modules accepted: Orders

## 2015-11-27 NOTE — Progress Notes (Signed)
Office Visit Note   Patient: [name]           Date of Birth: 09-23-1933           MRN: [id number] Visit Date: 11/27/2015              Requested by: Idelle Crouch, MD [place] [facility] [place], [place] [address] PCP: Idelle Crouch, MD   Assessment & Plan: Visit Diagnoses:  1. Idiopathic chronic gout of multiple sites without tophus     Plan: Continue allopurinol daily. Colchicine as needed during flares. We will refill her meloxicam today as well. We'll call tomorrow with uric acid.  Follow-Up Instructions: Return in about 3 months (around 02/27/2016).   Orders:  No orders of the defined types were placed in this encounter.  No orders of the defined types were placed in this encounter.     Procedures: No procedures performed   Clinical Data: No additional findings.   Subjective: Chief Complaint  Patient presents with  . Left Wrist - Pain  . Right Wrist - Pain    Pt states that she has been seen for bilateral wrist pain and elbow pain in the past and had been given bilateral short arm wrist splints. Does not wear them every day she states that she is not able to do anything with them on and has tried wearing just at night. Patient does not have them on today. She states that she is taking Mobic on old rx that she had been given and then advised not to take bc "i am a heart patient"  But states that " I take it anyway" because it is the only thing that helps with the pain that she is having. Patient states that the pain now radiates up to the elbow bilaterally. Also in for a recheck of her uric acid level patient is taking allopurinol BID and colchicine PRN. The last level was below 5 and states that when she stops taking the colchicine the pain comes back pretty quickly.    Review of Systems  Constitutional: Negative for chills and fever.  Musculoskeletal: Positive for arthralgias. Negative for joint swelling.  All other systems  reviewed and are negative.    Objective: Vital Signs: Ht 5\' 3"  (1.6 m)   Wt 152 lb (68.9 kg)   BMI 26.93 kg/m   Physical Exam  Constitutional: She is oriented to person, place, and time. She appears well-developed and well-nourished.  Pulmonary/Chest: Effort normal.  Neurological: She is alert and oriented to person, place, and time.  Psychiatric: She has a normal mood and affect.  Nursing note reviewed.  She has no redness, no cellulitis in her wrists or feet, no signs of infection.  Ortho Exam Risks and feet are nontender today. Left elbow nontender. No pain with range of motion.  Specialty Comments:  No specialty comments available.  Imaging: No results found.   PMFS History: Patient Active Problem List   Diagnosis Date Noted  . Idiopathic gout of multiple sites 11/27/2015  . Cellulitis 05/15/2015  . Bilateral leg edema 02/08/2015  . Chronic anxiety 11/22/2014  . Cerebrovascular accident (CVA) ([place]) 07/25/2014  . Arterial vascular disease 07/25/2014  . Swelling of right lower extremity 06/22/2014  . Type 2 diabetes mellitus ([place])   . Stroke ([place]) 06/08/2014  . Anemia 06/07/2014  . Unstable angina ([place]) 06/06/2014  . Carotid artery disease ([place])   . CAD (coronary artery disease)   . Hyperlipidemia   .  Hypertension   . Angina pectoris ([place]) 05/31/2014  . SOB (shortness of breath) 04/11/2014  . Palpitations 04/11/2014  . Other fatigue 04/11/2014  . Thyroid cancer ([place]) 04/11/2014   Past Medical History:  Diagnosis Date  . CAD (coronary artery disease)    a. lexiscan 08/2013: nl wall motion, no ischemia, EF 50-55%; b. cardiac cath 05/31/2014: mLAD 90%, dLAD 50%, dLCx 60%, 1st Mrg 80%, pRCA 60% s/p PCI/DES, mRCA 1st lesion 70%, mRCA 2nd 95% s/p PCI/long DES to cover entire mRCA, RPLB 40%;  c. 05/2014 Staged PCI of LAD w/ 2.5 x 15 mm Xience Alpine DES.  . Carotid artery disease ([place])   . Chronic anxiety   . Diverticulosis   . Esophagitis   . GERD (gastroesophageal  reflux disease)   . History of colon polyps   . History of echocardiogram    a. 08/2013: normal LVSF, nl RVSF, no valvular regurgitation or stenosis   . Hyperlipidemia   . Hypertension   . Hypothyroidism   . Mild reactive airways disease   . Osteoarthritis   . PONV (postoperative nausea and vomiting)   . Rheumatoid arthritis ([place])   . Stroke [facility])    a. 05/2014 pt c/o garbled speech following cath 5/19. MRI/A 5/26 showed left caudate infarct->conservative mgmt per neuro.  . Thyroid cancer ([place])    a. s/p right sided hemi-thyroidectomy; b. planning for radioactive iodine tx; c. followed by Dr. Marcelene Butte  . Type 2 diabetes mellitus ([facility])     Family History  Problem Relation Age of Onset  . Heart disease Sister     stent placed x 3     Past Surgical History:  Procedure Laterality Date  . ABDOMINAL HYSTERECTOMY    . APPENDECTOMY    . AUGMENTATION MAMMAPLASTY    . BACK SURGERY    . BREAST IMPLANT REMOVAL  06/2001  . CARDIAC CATHETERIZATION N/A 06/06/2014   Procedure: Coronary/Graft Atherectomy;  Surgeon: Wellington Hampshire, MD;  Location: [place] [facility] LAB;  Service: Cardiovascular;  Laterality: N/A;  . CARDIAC CATHETERIZATION N/A 05/31/2014   Procedure: Left Heart Cath;  Surgeon: Minna Merritts, MD;  Location: [place] [facility];  Service: Cardiovascular;  Laterality: N/A;  . CARDIAC CATHETERIZATION N/A 05/31/2014   Procedure: Coronary Stent Intervention;  Surgeon: Wellington Hampshire, MD;  Location: [place] [facility];  Service: Cardiovascular;  Laterality: N/A;  . CARPAL TUNNEL RELEASE     "I've have a total of 7" (06/06/2014)  . CATARACT EXTRACTION W/ INTRAOCULAR LENS  IMPLANT, BILATERAL Bilateral   . CHOLECYSTECTOMY    . CORONARY ANGIOPLASTY WITH STENT PLACEMENT  05/31/2014; 06/06/2014   "2; 1"  . GANGLION CYST EXCISION Right    AC joint  . LUMBAR LAMINECTOMY  X 6  . MENISCUS REPAIR Bilateral    "2 on the right; 1 on the left"  . ROTATOR CUFF REPAIR     "I think 2 left, 2 right"    . THYROIDECTOMY    . TONSILLECTOMY    . TUBAL LIGATION     Social History   Occupational History  . retired    Social History Main Topics  . Smoking status: Never Smoker  . Smokeless tobacco: Never Used  . Alcohol use No  . Drug use: No  . Sexual activity: Not Currently

## 2015-12-22 ENCOUNTER — Other Ambulatory Visit (INDEPENDENT_AMBULATORY_CARE_PROVIDER_SITE_OTHER): Payer: Self-pay | Admitting: Orthopedic Surgery

## 2015-12-27 ENCOUNTER — Encounter: Payer: Self-pay | Admitting: Emergency Medicine

## 2015-12-27 ENCOUNTER — Other Ambulatory Visit: Payer: Self-pay | Admitting: Internal Medicine

## 2015-12-27 ENCOUNTER — Emergency Department: Payer: Medicare Other

## 2015-12-27 ENCOUNTER — Emergency Department
Admission: EM | Admit: 2015-12-27 | Discharge: 2015-12-28 | Disposition: A | Discharge status: Home / Self Care | Payer: Medicare Other | Attending: Emergency Medicine | Admitting: Emergency Medicine

## 2015-12-27 ENCOUNTER — Telehealth: Payer: Self-pay | Admitting: Cardiovascular Disease

## 2015-12-27 DIAGNOSIS — J45909 Unspecified asthma, uncomplicated: Secondary | ICD-10-CM | POA: Diagnosis not present

## 2015-12-27 DIAGNOSIS — Z7982 Long term (current) use of aspirin: Secondary | ICD-10-CM | POA: Insufficient documentation

## 2015-12-27 DIAGNOSIS — E119 Type 2 diabetes mellitus without complications: Secondary | ICD-10-CM | POA: Insufficient documentation

## 2015-12-27 DIAGNOSIS — I2510 Atherosclerotic heart disease of native coronary artery without angina pectoris: Secondary | ICD-10-CM | POA: Diagnosis not present

## 2015-12-27 DIAGNOSIS — Z7984 Long term (current) use of oral hypoglycemic drugs: Secondary | ICD-10-CM | POA: Insufficient documentation

## 2015-12-27 DIAGNOSIS — R55 Syncope and collapse: Secondary | ICD-10-CM

## 2015-12-27 DIAGNOSIS — M7121 Synovial cyst of popliteal space [Baker], right knee: Secondary | ICD-10-CM | POA: Diagnosis not present

## 2015-12-27 DIAGNOSIS — Z79899 Other long term (current) drug therapy: Secondary | ICD-10-CM | POA: Insufficient documentation

## 2015-12-27 DIAGNOSIS — I10 Essential (primary) hypertension: Secondary | ICD-10-CM | POA: Diagnosis not present

## 2015-12-27 DIAGNOSIS — E039 Hypothyroidism, unspecified: Secondary | ICD-10-CM | POA: Diagnosis not present

## 2015-12-27 DIAGNOSIS — M79604 Pain in right leg: Secondary | ICD-10-CM

## 2015-12-27 MED ORDER — TRAMADOL HCL 50 MG PO TABS
25.0000 mg | ORAL_TABLET | Freq: Once | ORAL | Status: AC
  Administered 2015-12-28: 25 mg via ORAL
  Filled 2015-12-27: qty 1

## 2015-12-27 NOTE — ED Triage Notes (Signed)
Pt ambulatory to triage, reports right lower leg pain x couple months, sent over by pcp for evaluation of DVT, no hx of clots.

## 2015-12-27 NOTE — ED Notes (Signed)
Pt taken to US

## 2015-12-27 NOTE — ED Notes (Signed)
Pt still in [place]. Will assess when she gets to room.

## 2015-12-27 NOTE — Telephone Encounter (Signed)
Spoke w/ pt.  She reports that she has a very heavy rt leg and it is painful. She reports that her rt leg wakes her up at night for the past couple of weeks.  She is being evaluated for ministrokes by another MD. She had 2 falls in May & July of 2017.    She is concerned that she has a blood clot in her leg. She denies redness, heat or swelling. She asks if we can give her an u/s today, but advised her that due to the late hour, we do not have any openings. After some discussion, she requests an appt at the hospital for u/s tonight or tomorrow. Advised her that we cannot schedule after hours appts for this. She would like to proceed to ED to have this evaluated before the weekend and will proceed there now. Asked her to call back if we can be of further assistance.

## 2015-12-27 NOTE — ED Notes (Signed)
Dr. Sung at bedside.  

## 2015-12-27 NOTE — Telephone Encounter (Signed)
Patient wants an early fu in January do address some concerns of feeling like she may pass out and heaviness in her RLE she thinks may be a blood clot.  Patient does not want sooner than January appt.

## 2015-12-27 NOTE — ED Provider Notes (Signed)
[facility] Emergency Department Provider Note   ____________________________________________   First MD Initiated Contact with Patient 12/27/15 2317     (approximate)  I have reviewed the triage vital signs and the nursing notes.   HISTORY  Chief Complaint Leg Pain (right)    HPI [name] is a 82 y.o. female sent to the ED by her PCP for ultrasound to evaluate right lower extremity DVT. Patient reports nontraumatic right lower leg pain for 2 months. Called her PCP this afternoon but the clinic was closing so she was referred to the ED for further evaluation. Complains of right lower extremity pain, worse at night and with ambulation. Denies associated extremity weakness, numbness/tingling. Denies recent fever, chills, chest pain, shortness of breath, abdominal pain, nausea, vomiting, diarrhea. Denies recent travel or trauma. Nothing makes her pain better. Weightbearing makes her pain worse.   Past Medical History:  Diagnosis Date  . CAD (coronary artery disease)    a. lexiscan 08/2013: nl wall motion, no ischemia, EF 50-55%; b. cardiac cath 05/31/2014: mLAD 90%, dLAD 50%, dLCx 60%, 1st Mrg 80%, pRCA 60% s/p PCI/DES, mRCA 1st lesion 70%, mRCA 2nd 95% s/p PCI/long DES to cover entire mRCA, RPLB 40%;  c. 05/2014 Staged PCI of LAD w/ 2.5 x 15 mm Xience [place] DES.  . Carotid artery disease ([place])   . Chronic anxiety   . Diverticulosis   . Esophagitis   . GERD (gastroesophageal reflux disease)   . History of colon polyps   . History of echocardiogram    a. 08/2013: normal LVSF, nl RVSF, no valvular regurgitation or stenosis   . Hyperlipidemia   . Hypertension   . Hypothyroidism   . Mild reactive airways disease   . Osteoarthritis   . PONV (postoperative nausea and vomiting)   . Rheumatoid arthritis ([place])   . Stroke [facility])    a. 05/2014 pt c/o garbled speech following cath 5/19. MRI/A 5/26 showed left caudate infarct->conservative mgmt per neuro.  .  Thyroid cancer ([place])    a. s/p right sided hemi-thyroidectomy; b. planning for radioactive iodine tx; c. followed by Dr. Marcelene Butte  . Type 2 diabetes mellitus [facility])     Patient Active Problem List   Diagnosis Date Noted  . Idiopathic gout of multiple sites 11/27/2015  . Cellulitis 05/15/2015  . Bilateral leg edema 02/08/2015  . Chronic anxiety 11/22/2014  . Cerebrovascular accident (CVA) ([place]) 07/25/2014  . Arterial vascular disease 07/25/2014  . Swelling of right lower extremity 06/22/2014  . Type 2 diabetes mellitus ([place])   . Stroke ([place]) 06/08/2014  . Anemia 06/07/2014  . Unstable angina ([place]) 06/06/2014  . Carotid artery disease ([place])   . CAD (coronary artery disease)   . Hyperlipidemia   . Hypertension   . Angina pectoris ([place]) 05/31/2014  . SOB (shortness of breath) 04/11/2014  . Palpitations 04/11/2014  . Other fatigue 04/11/2014  . Thyroid cancer ([place]) 04/11/2014    Past Surgical History:  Procedure Laterality Date  . ABDOMINAL HYSTERECTOMY    . APPENDECTOMY    . AUGMENTATION MAMMAPLASTY    . BACK SURGERY    . BREAST IMPLANT REMOVAL  06/2001  . CARDIAC CATHETERIZATION N/A 06/06/2014   Procedure: Coronary/Graft Atherectomy;  Surgeon: Wellington Hampshire, MD;  Location: [place] [facility];  Service: Cardiovascular;  Laterality: N/A;  . CARDIAC CATHETERIZATION N/A 05/31/2014   Procedure: Left Heart Cath;  Surgeon: Minna Merritts, MD;  Location: [place] [facility];  Service: Cardiovascular;  Laterality: N/A;  . CARDIAC CATHETERIZATION N/A 05/31/2014   Procedure: Coronary Stent Intervention;  Surgeon: Wellington Hampshire, MD;  Location: [place] [facility];  Service: Cardiovascular;  Laterality: N/A;  . CARPAL TUNNEL RELEASE     "I've have a total of 7" (06/06/2014)  . CATARACT EXTRACTION W/ INTRAOCULAR LENS  IMPLANT, BILATERAL Bilateral   . CHOLECYSTECTOMY    . CORONARY ANGIOPLASTY WITH STENT PLACEMENT  05/31/2014; 06/06/2014   "2; 1"  . GANGLION CYST EXCISION Right    AC  joint  . LUMBAR LAMINECTOMY  X 6  . MENISCUS REPAIR Bilateral    "2 on the right; 1 on the left"  . ROTATOR CUFF REPAIR     "I think 2 left, 2 right"  . THYROIDECTOMY    . TONSILLECTOMY    . TUBAL LIGATION      Prior to Admission medications   Medication Sig Start Date End Date Taking? Authorizing Provider  allopurinol (ZYLOPRIM) 100 MG tablet TAKE 1 TABLET BY MOUTH TWICE DAILY FOR GOUT 12/23/15   Newt Minion, MD  ALPRAZolam Duanne Moron) 0.25 MG tablet Take 0.25 mg by mouth at bedtime as needed.  08/23/13   Historical Provider, MD  aspirin EC 81 MG tablet Take 81 mg by mouth daily.     Historical Provider, MD  Cholecalciferol 1000 UNITS tablet Take 1,000 Units by mouth daily.     Historical Provider, MD  clopidogrel (PLAVIX) 75 MG tablet Take 1 tablet (75 mg total) by mouth daily. 02/08/15   Minna Merritts, MD  Colchicine (MITIGARE) 0.6 MG CAPS Take 0.6 mg by mouth 2 (two) times daily. 11/27/15   Chrystie Nose, NP  ferrous fumarate (HEMOCYTE - 106 MG FE) 325 (106 FE) MG TABS tablet Take 1 tablet (106 mg of iron total) by mouth daily. 11/22/14   Forest Gleason, MD  furosemide (LASIX) 20 MG tablet TAKE 1 TABLET BY MOUTH AS NEEDED FOR EDEMA 12/12/14   Minna Merritts, MD  levothyroxine (SYNTHROID, LEVOTHROID) 100 MCG tablet TAKE 1 TABLET(100 MCG) BY MOUTH DAILY BEFORE BREAKFAST 04/08/15   Forest Gleason, MD  meloxicam (MOBIC) 7.5 MG tablet Take 1 tablet (7.5 mg total) by mouth daily as needed for pain. 11/27/15   Chrystie Nose, NP  metFORMIN (GLUCOPHAGE) 500 MG tablet Take 500 mg by mouth 2 (two) times daily with a meal.    Historical Provider, MD  metoprolol succinate (TOPROL XL) 50 MG 24 hr tablet Take 1 tablet (50 mg total) by mouth daily. 05/13/15   Minna Merritts, MD  mupirocin ointment (BACTROBAN) 2 % Apply to affected area 3 times daily 05/11/15 05/10/16  Earleen Newport, MD  nitroGLYCERIN (NITROSTAT) 0.4 MG SL tablet PLACE 1 TABLET UNDER THE TONGUE EVERY 5 MINUTES AS NEEDED FOR  CHEST PAIN 10/01/15   Minna Merritts, MD  nitroGLYCERIN (NITROSTAT) 0.4 MG SL tablet PLACE 1 TABLET UNDER THE TONGUE EVERY 5 MINUTES AS NEEDED FOR CHEST PAIN 10/28/15   Minna Merritts, MD  nitroGLYCERIN (NITROSTAT) 0.4 MG SL tablet PLACE 1 TABLET UNDER THE TONGUE EVERY 5 MINUTES AS NEEDED FOR CHEST PAIN 11/25/15   Minna Merritts, MD  pantoprazole (PROTONIX) 40 MG tablet TAKE 1 TABLET(40 MG) BY MOUTH DAILY 11/27/15   Minna Merritts, MD  potassium chloride (K-DUR) 10 MEQ tablet TAKE 1 TABLET BY MOUTH AS NEEDED. TAKE WITH FUROSEMIDE 12/12/14   Minna Merritts, MD  ramipril (ALTACE) 10 MG capsule TAKE 1 CAPSULE(10 MG) BY MOUTH  DAILY 05/13/15   Minna Merritts, MD  rosuvastatin (CRESTOR) 5 MG tablet Take 1 tablet (5 mg total) by mouth daily. 02/08/15   Minna Merritts, MD  Turmeric 500 MG CAPS Take 500 mg by mouth daily.    Historical Provider, MD    Allergies Ciprofloxacin; Codeine; Metronidazole; Sulfa antibiotics; Neomycin; Bacitracin-polymyxin b; Celecoxib; Petrolatum-zinc oxide; Statins; and Vioxx [rofecoxib]  Family History  Problem Relation Age of Onset  . Heart disease Sister     stent placed x 3     Social History Social History  Substance Use Topics  . Smoking status: Never Smoker  . Smokeless tobacco: Never Used  . Alcohol use No    Review of Systems  Constitutional: No fever/chills. Eyes: No visual changes. ENT: No sore throat. Cardiovascular: Denies chest pain. Respiratory: Denies shortness of breath. Gastrointestinal: No abdominal pain.  No nausea, no vomiting.  No diarrhea.  No constipation. Genitourinary: Negative for dysuria. Musculoskeletal: Positive for right leg pain. Skin: Negative for rash. Neurological: Negative for headaches, focal weakness or numbness.  10-point ROS otherwise negative.  ____________________________________________   PHYSICAL EXAM:  VITAL SIGNS: ED Triage Vitals [12/27/15 1945]  Enc Vitals Group     BP (!) 150/59     Pulse  Rate 71     Resp 16     Temp 97.7 F (36.5 C)     Temp Source Oral     SpO2 99 %     Weight 142 lb (64.4 kg)     Height 5\' 3"  (1.6 m)     Head Circumference      Peak Flow      Pain Score 3     Pain Loc      Pain Edu?      Excl. in [place]?     Constitutional: Alert and oriented. Well appearing and in no acute distress. Eyes: Conjunctivae are normal. PERRL. EOMI. Head: Atraumatic. Nose: No congestion/rhinnorhea. Mouth/Throat: Mucous membranes are moist.  Oropharynx non-erythematous. Neck: No stridor.  No cervical spine tenderness to palpation. Cardiovascular: Normal rate, regular rhythm. Grossly normal heart sounds.  Good peripheral circulation. Respiratory: Normal respiratory effort.  No retractions. Lungs CTAB. Gastrointestinal: Soft and nontender. No distention. No abdominal bruits. No CVA tenderness. Musculoskeletal: Right popliteal fossa tender to palpation. Supple without evidence for compartment syndrome. Symmetrically warm limb without evidence for ischemia. 2+ femoral, popliteal and distal pulses. Brisk, less than 5 second capillary refill. 1+ nonpitting edema.  No joint effusions. Neurologic:  Normal speech and language. No gross focal neurologic deficits are appreciated. Ambulates with cane. Skin:  Skin is warm, dry and intact. No rash noted. Psychiatric: Mood and affect are normal. Speech and behavior are normal.  ____________________________________________   LABS (all labs ordered are listed, but only abnormal results are displayed)  Labs Reviewed - No data to display ____________________________________________  EKG  None ____________________________________________  RADIOLOGY  RLE Doppler interpreted per Dr. Francoise Ceo: 1. No sonographic evidence for right lower extremity acute DVT.  2. Cystic mass containing scattered echoes within the popliteal  fossa   ____________________________________________   PROCEDURES  Procedure(s) performed:  None  Procedures  Critical Care performed: No  ____________________________________________   INITIAL IMPRESSION / ASSESSMENT AND PLAN / ED COURSE  Pertinent labs & imaging results that were available during my care of the patient were reviewed by me and considered in my medical decision making (see chart for details).  82 year old female with a two-month history of right lower extremity pain. Found to  have complex Bakers cyst on ultrasound. Will apply Ace wrap, analgesia and patient to follow-up either with her orthopedic doctor in [place] or locally. Strict return precautions given. Patient and spouse verbalize understanding and agree with plan of care.  Clinical Course      ____________________________________________   FINAL CLINICAL IMPRESSION(S) / ED DIAGNOSES  Final diagnoses:  Right leg pain  Synovial cyst of right popliteal space      NEW MEDICATIONS STARTED DURING THIS VISIT:  New Prescriptions   No medications on file     Note:  This document was prepared using Dragon voice recognition software and may include unintentional dictation errors.    Paulette Blanch, MD 12/28/15 [phone]

## 2015-12-27 NOTE — Telephone Encounter (Signed)
Left message for pt to call back  °

## 2015-12-28 MED ORDER — TRAMADOL HCL 50 MG PO TABS
ORAL_TABLET | ORAL | 0 refills | Status: DC
Start: 2015-12-28 — End: 2016-01-23

## 2015-12-28 NOTE — ED Notes (Signed)
Pt alert and oriented X4, active, cooperative, pt in NAD. RR even and unlabored, color WNL.  Pt informed to return if any life threatening symptoms occur.   

## 2015-12-28 NOTE — Discharge Instructions (Signed)
1. You may remove Ace wrap to bathe or sleep. 2. You may take tramadol as needed for pain. 3. Return to the ER for worsening symptoms, extremity weakness, numbness/tingling, difficulty breathing or other concerns.

## 2016-01-19 ENCOUNTER — Other Ambulatory Visit (INDEPENDENT_AMBULATORY_CARE_PROVIDER_SITE_OTHER): Payer: Self-pay | Admitting: Orthopedic Surgery

## 2016-01-23 ENCOUNTER — Ambulatory Visit (INDEPENDENT_AMBULATORY_CARE_PROVIDER_SITE_OTHER): Payer: Medicare Other | Admitting: Cardiovascular Disease

## 2016-01-23 ENCOUNTER — Encounter: Payer: Self-pay | Admitting: Cardiovascular Disease

## 2016-01-23 VITALS — BP 134/64 | HR 68 | Ht 63.0 in | Wt 142.5 lb

## 2016-01-23 DIAGNOSIS — R42 Dizziness and giddiness: Secondary | ICD-10-CM

## 2016-01-23 DIAGNOSIS — E1159 Type 2 diabetes mellitus with other circulatory complications: Secondary | ICD-10-CM

## 2016-01-23 DIAGNOSIS — E782 Mixed hyperlipidemia: Secondary | ICD-10-CM

## 2016-01-23 DIAGNOSIS — R55 Syncope and collapse: Secondary | ICD-10-CM | POA: Diagnosis not present

## 2016-01-23 DIAGNOSIS — D649 Anemia, unspecified: Secondary | ICD-10-CM

## 2016-01-23 DIAGNOSIS — I2510 Atherosclerotic heart disease of native coronary artery without angina pectoris: Secondary | ICD-10-CM

## 2016-01-23 DIAGNOSIS — I10 Essential (primary) hypertension: Secondary | ICD-10-CM

## 2016-01-23 MED ORDER — MECLIZINE HCL 25 MG PO TABS: 25.0000 mg | ORAL_TABLET | Freq: Three times a day (TID) | ORAL | 3 refills | Status: DC | PRN

## 2016-01-23 NOTE — Patient Instructions (Addendum)
Medication Instructions:   No medication changes made  Please take meclizine as needed for vertigo  Labwork:  No new labs needed  Testing/Procedures:  No further testing at this time   I recommend watching educational videos on topics of interest to you at:       [email or web address]  Enter code: [email or web address]    Follow-Up: It was a pleasure seeing you in the office today. Please call us if you have new issues that need to be addressed before your next appt.  [phone]  Your physician wants you to follow-up in: 12 months.  You will receive a reminder letter in the mail two months in advance. If you don't receive a letter, please call our office to schedule the follow-up appointment.  If you need a refill on your cardiac medications before your next appointment, please call your pharmacy.     Vertigo Introduction Vertigo means that you feel like you are moving when you are not. Vertigo can also make you feel like things around you are moving when they are not. This feeling can come and go at any time. Vertigo often goes away on its own. Follow these instructions at home:  Avoid making fast movements.  Avoid driving.  Avoid using heavy machinery.  Avoid doing any task or activity that might cause danger to you or other people if you would have a vertigo attack while you are doing it.  Sit down right away if you feel dizzy or have trouble with your balance.  Take over-the-counter and prescription medicines only as told by your doctor.  Follow instructions from your doctor about which positions or movements you should avoid.  Drink enough fluid to keep your pee (urine) clear or pale yellow.  Keep all follow-up visits as told by your doctor. This is important. Contact a doctor if:  Medicine does not help your vertigo.  You have a fever.  Your problems get worse or you have new symptoms.  Your family or friends see changes in your behavior.  You feel sick to your  stomach (nauseous) or you throw up (vomit).  You have a "pins and needles" feeling or you are numb in part of your body. Get help right away if:  You have trouble moving or talking.  You are always dizzy.  You pass out (faint).  You get very bad headaches.  You feel weak or have trouble using your hands, arms, or legs.  You have changes in your hearing.  You have changes in your seeing (vision).  You get a stiff neck.  Bright light starts to bother you. This information is not intended to replace advice given to you by your health care provider. Make sure you discuss any questions you have with your health care provider. Document Released: 10/08/2007 Document Revised: 06/06/2015 Document Reviewed: 04/23/2014  2017 Elsevier

## 2016-01-23 NOTE — Progress Notes (Addendum)
Cardiology Office Note  Date:  01/23/2016   ID:  [name], DOB 02-Apr-1933, MRN [id number]  PCP:  Idelle Crouch, MD   Chief Complaint  Patient presents with  . other    Early fu feeling like she may pass out some heaviness in legs.  Reviewed meds with pt verbally.    HPI:  83 y/o female with coronary artery disease, cardiac cath May 2016 revealing severe LAD and RCA disease, Percutaneous intervention and drug-eluting stent placement was performed to  the right coronary artery.The LAD was also felt to be amenable for PCI but it was felt that she would require a Rotablator. This was performed later at [facility] Jun 01, 2014 She presents today for routine follow-up for coronary artery disease  In follow-up today she reports that she has had symptoms of lightheadedness/spinning. No symptoms for the past 2 weeks Prior to that had daily symptoms for several weeks. Typically occurred while she was standing getting ready for bed. Described it as a feeling like she was going to go out. Spinning, room turning. She would lay down, symptoms would eventually resolve She was taking some Dramamine at home which made her sleepy  Denies any tachycardia or palpitations concerning for arrhythmia She is not been checking her blood pressure at home even when having symptoms of lightheadedness  No recent falls  Lab work reviewed with her HCT 32 HBA1C 6.2 Total chol 125 Thyroid medication: up to 100 ug in 12/2015 from 75 g TSH 9.7  EKG on today's visit shows normal sinus rhythm with rate 68 bpm, no significant ST or T-wave changes  Other past medical history reviewed Prior falls x 3 Mechanical, tripped on a mat, often stepping in the wrong place Very deconditioned, terrible gait instability, walks with a cane  Previous problems with gout, uric acid 8.1 On meloxican prn for pain  She eats out every day, 3 meals per day, drinks significant amount of water. She watches her weight  daily and takes Lasix for any increase in her weight No regular exercise,  Mild chronic shortness of breath on exertion  Lab work reviewed showing HCT 33 HBA1C 6.2 total chol 125 LDL 37  Orthostatics performed,  No drop in pressures  EKG on today's visit shows no sinus rhythm with rate 73 bpm, no significant ST or T-wave changes  Other past medical history episode of garbled speech after PCI and MRI/A of the brain was performed and showed an acute, small, nonhemorrhagic infarct of the left caudate with moderate small vessel disease. She did not have any acute neurologic deficits.  Carotid ultrasound was performed and showed bilateral 1-39% internal carotid artery stenosis.   Thyroid nodule found, status post ablation, currently on thyroid supplementation This was performed March 2016  PMH:   has a past medical history of CAD (coronary artery disease); Carotid artery disease ([place]); Chronic anxiety; Diverticulosis; Esophagitis; GERD (gastroesophageal reflux disease); History of colon polyps; History of echocardiogram; Hyperlipidemia; Hypertension; Hypothyroidism; Mild reactive airways disease; Osteoarthritis; PONV (postoperative nausea and vomiting); Rheumatoid arthritis ([place]); Stroke [facility]); Thyroid cancer ([place]); and Type 2 diabetes mellitus ([place]).  PSH:    Past Surgical History:  Procedure Laterality Date  . ABDOMINAL HYSTERECTOMY    . APPENDECTOMY    . AUGMENTATION MAMMAPLASTY    . BACK SURGERY    . BREAST IMPLANT REMOVAL  06/2001  . CARDIAC CATHETERIZATION N/A 06/06/2014   Procedure: Coronary/Graft Atherectomy;  Surgeon: Wellington Hampshire, MD;  Location: [place] [facility];  Service: Cardiovascular;  Laterality: N/A;  . CARDIAC CATHETERIZATION N/A 05/31/2014   Procedure: Left Heart Cath;  Surgeon: Minna Merritts, MD;  Location: [place] [facility];  Service: Cardiovascular;  Laterality: N/A;  . CARDIAC CATHETERIZATION N/A 05/31/2014   Procedure: Coronary Stent Intervention;   Surgeon: Wellington Hampshire, MD;  Location: [place] [facility];  Service: Cardiovascular;  Laterality: N/A;  . CARPAL TUNNEL RELEASE     "I've have a total of 7" (06/06/2014)  . CATARACT EXTRACTION W/ INTRAOCULAR LENS  IMPLANT, BILATERAL Bilateral   . CHOLECYSTECTOMY    . CORONARY ANGIOPLASTY WITH STENT PLACEMENT  05/31/2014; 06/06/2014   "2; 1"  . GANGLION CYST EXCISION Right    AC joint  . LUMBAR LAMINECTOMY  X 6  . MENISCUS REPAIR Bilateral    "2 on the right; 1 on the left"  . ROTATOR CUFF REPAIR     "I think 2 left, 2 right"  . THYROIDECTOMY    . TONSILLECTOMY    . TUBAL LIGATION      Current Outpatient Prescriptions  Medication Sig Dispense Refill  . allopurinol (ZYLOPRIM) 100 MG tablet TAKE 1 TABLET BY MOUTH TWICE DAILY FOR GOUT 60 tablet 0  . ALPRAZolam (XANAX) 0.25 MG tablet Take 0.25 mg by mouth at bedtime as needed.     Marland Kitchen aspirin EC 81 MG tablet Take 81 mg by mouth daily.     . Cholecalciferol 1000 UNITS tablet Take 1,000 Units by mouth daily.     . clopidogrel (PLAVIX) 75 MG tablet Take 1 tablet (75 mg total) by mouth daily. 90 tablet 3  . Colchicine (MITIGARE) 0.6 MG CAPS Take 0.6 mg by mouth 2 (two) times daily. 30 capsule 2  . ferrous fumarate (HEMOCYTE - 106 MG FE) 325 (106 FE) MG TABS tablet Take 1 tablet (106 mg of iron total) by mouth daily. 90 each 3  . furosemide (LASIX) 20 MG tablet TAKE 1 TABLET BY MOUTH AS NEEDED FOR EDEMA 30 tablet 3  . levothyroxine (SYNTHROID, LEVOTHROID) 100 MCG tablet TAKE 1 TABLET(100 MCG) BY MOUTH DAILY BEFORE BREAKFAST 30 tablet 0  . metFORMIN (GLUCOPHAGE) 500 MG tablet Take 500 mg by mouth 2 (two) times daily with a meal.    . metoprolol succinate (TOPROL XL) 50 MG 24 hr tablet Take 1 tablet (50 mg total) by mouth daily. 90 tablet 3  . mupirocin ointment (BACTROBAN) 2 % Apply to affected area 3 times daily 22 g 0  . nitroGLYCERIN (NITROSTAT) 0.4 MG SL tablet PLACE 1 TABLET UNDER THE TONGUE EVERY 5 MINUTES AS NEEDED FOR CHEST PAIN 25  tablet 2  . pantoprazole (PROTONIX) 40 MG tablet TAKE 1 TABLET(40 MG) BY MOUTH DAILY 30 tablet 3  . potassium chloride (K-DUR) 10 MEQ tablet TAKE 1 TABLET BY MOUTH AS NEEDED. TAKE WITH FUROSEMIDE 30 tablet 3  . ramipril (ALTACE) 10 MG capsule TAKE 1 CAPSULE(10 MG) BY MOUTH DAILY 30 capsule 3  . rosuvastatin (CRESTOR) 5 MG tablet Take 1 tablet (5 mg total) by mouth daily. 90 tablet 3  . Turmeric 500 MG CAPS Take 500 mg by mouth daily.    . meclizine (ANTIVERT) 25 MG tablet Take 1 tablet (25 mg total) by mouth 3 (three) times daily as needed. 90 tablet 3   No current facility-administered medications for this visit.      Allergies:   Ciprofloxacin; Codeine; Metronidazole; Sulfa antibiotics; Neomycin; Bacitracin-polymyxin b; Celecoxib; Petrolatum-zinc oxide; Statins; and Vioxx [rofecoxib]   Social History:  The patient  reports that she has never smoked. She has never used smokeless tobacco. She reports that she does not drink alcohol or use drugs.   Family History:   family history includes Heart disease in her sister.    Review of Systems: Review of Systems  Constitutional: Negative.   Respiratory: Negative.   Cardiovascular: Negative.   Gastrointestinal: Negative.   Musculoskeletal: Negative.   Neurological: Positive for dizziness.  Psychiatric/Behavioral: Negative.   All other systems reviewed and are negative.    PHYSICAL EXAM: VS:  BP 134/64 (BP Location: Left Arm, Patient Position: Sitting, Cuff Size: Normal)   Pulse 68   Ht 5\' 3"  (1.6 m)   Wt 142 lb 8 oz (64.6 kg)   BMI 25.24 kg/m  , BMI Body mass index is 25.24 kg/m. GEN: Well nourished, well developed, in no acute distress  HEENT: normal  Neck: no JVD, carotid bruits, or masses Cardiac: RRR; no murmurs, rubs, or gallops,no edema  Respiratory:  clear to auscultation bilaterally, normal work of breathing GI: soft, nontender, nondistended, + BS MS: no deformity or atrophy  Skin: warm and dry, no rash Neuro:   Strength and sensation are intact Psych: euthymic mood, full affect    Recent Labs: 10/21/2015: ALT 16; BUN 22; Creatinine, Ser 0.74; Hemoglobin 10.2; Platelets 196; Potassium 4.3; Sodium 138; TSH 1.970    Lipid Panel Lab Results  Component Value Date   CHOL 151 08/14/2014   HDL 42 08/14/2014   LDLCALC 67 08/14/2014   TRIG 209 (H) 08/14/2014      Wt Readings from Last 3 Encounters:  01/23/16 142 lb 8 oz (64.6 kg)  12/27/15 142 lb (64.4 kg)  11/27/15 152 lb (68.9 kg)       ASSESSMENT AND PLAN:  Vertigo Recent symptoms concerning for vertigo She treated herself with Dramamine at home Symptoms have resolved for the past 2 weeks Suggested she could also try meclizine  Near syncope She reported having near syncope but symptoms likely from vertigo If she does have recurrent symptoms recommended she call our office Two-week monitor could be ordered. Also recommended she check blood pressure when she has symptoms  Coronary artery disease involving native coronary artery of native heart without angina pectoris Currently with no symptoms of angina. No further workup at this time. Continue current medication regimen.  Mixed hyperlipidemia Cholesterol at goal, continue Crestor  Essential hypertension Blood pressure is well controlled on today's visit. No changes made to the medications.  Type 2 diabetes mellitus with other circulatory complication, without long-term current use of insulin (HCC) Unable to hemoglobin A1c well controlled Encouraged aggressive low carbohydrate diet  Anemia She reports this is a chronic issue Monitored by primary care   Total encounter time more than 25 minutes  Greater than 50% was spent in counseling and coordination of care with the patient   Disposition:   F/U  6 months  No orders of the defined types were placed in this encounter.    Signed, Esmond Plants, M.D., Ph.D. 01/23/2016  [place],  [place] \

## 2016-01-27 NOTE — Addendum Note (Signed)
Addended by: Britt Bottom on: 01/27/2016 08:32 AM   Modules accepted: Orders

## 2016-01-28 ENCOUNTER — Other Ambulatory Visit (INDEPENDENT_AMBULATORY_CARE_PROVIDER_SITE_OTHER): Payer: Self-pay | Admitting: Family

## 2016-01-28 DIAGNOSIS — M159 Polyosteoarthritis, unspecified: Secondary | ICD-10-CM

## 2016-01-31 ENCOUNTER — Other Ambulatory Visit: Payer: Self-pay | Admitting: Cardiovascular Disease

## 2016-01-31 DIAGNOSIS — I2510 Atherosclerotic heart disease of native coronary artery without angina pectoris: Secondary | ICD-10-CM

## 2016-02-06 ENCOUNTER — Other Ambulatory Visit: Payer: Self-pay | Admitting: Cardiovascular Disease

## 2016-02-17 ENCOUNTER — Other Ambulatory Visit: Payer: Self-pay

## 2016-02-17 ENCOUNTER — Telehealth: Payer: Self-pay | Admitting: Cardiovascular Disease

## 2016-02-17 MED ORDER — ROSUVASTATIN CALCIUM 5 MG PO TABS: 5.0000 mg | ORAL_TABLET | Freq: Every day | ORAL | 3 refills | Status: DC

## 2016-02-17 NOTE — Telephone Encounter (Signed)
Sent in refill for Crestor to [facility].

## 2016-02-17 NOTE — Telephone Encounter (Signed)
°*  STAT* If patient is at the pharmacy, call can be transferred to refill team.   1. Which medications need to be refilled? (please list name of each medication and dose if known) crestor  2. Which pharmacy/location (including street and city if local pharmacy) is medication to be sent to? [facility] pharmacy   3. Do they need a 30 day or 90 day supply? 30 day

## 2016-02-20 ENCOUNTER — Other Ambulatory Visit (INDEPENDENT_AMBULATORY_CARE_PROVIDER_SITE_OTHER): Payer: Self-pay | Admitting: Orthopedic Surgery

## 2016-02-27 ENCOUNTER — Ambulatory Visit (INDEPENDENT_AMBULATORY_CARE_PROVIDER_SITE_OTHER): Payer: Medicare Other | Admitting: Orthopedic Surgery

## 2016-03-17 ENCOUNTER — Ambulatory Visit (INDEPENDENT_AMBULATORY_CARE_PROVIDER_SITE_OTHER): Payer: Medicare Other

## 2016-03-17 ENCOUNTER — Ambulatory Visit (INDEPENDENT_AMBULATORY_CARE_PROVIDER_SITE_OTHER): Payer: Medicare Other | Admitting: Orthopedic Surgery

## 2016-03-17 ENCOUNTER — Encounter (INDEPENDENT_AMBULATORY_CARE_PROVIDER_SITE_OTHER): Payer: Self-pay | Admitting: Orthopedic Surgery

## 2016-03-17 DIAGNOSIS — M1A09X0 Idiopathic chronic gout, multiple sites, without tophus (tophi): Secondary | ICD-10-CM

## 2016-03-17 DIAGNOSIS — M545 Low back pain, unspecified: Secondary | ICD-10-CM

## 2016-03-17 LAB — URIC ACID: Uric Acid, Serum: 4.4 mg/dL (ref 2.5–7.0)

## 2016-03-17 MED ORDER — ALLOPURINOL 100 MG PO TABS: 100.0000 mg | ORAL_TABLET | Freq: Two times a day (BID) | ORAL | 10 refills | Status: DC

## 2016-03-17 MED ORDER — PREDNISONE 10 MG PO TABS: 20.0000 mg | ORAL_TABLET | Freq: Every day | ORAL | 0 refills | Status: DC

## 2016-03-17 NOTE — Progress Notes (Signed)
Office Visit Note   Patient: [name]           Date of Birth: 1933/04/17           MRN: [id number] Visit Date: 03/17/2016              Requested by: Idelle Crouch, MD [facility] [place],  [id number] PCP: Idelle Crouch, MD  Chief Complaint  Patient presents with  . Lower Back - Pain    HPI: Patient is an 83 y.o female who presents for 3 month follow up. She is doing well overall. She continues to take allopurinol 100 mg 1 po bid. She is also mitigare. She is requesting a refill on both. She also would like lower back evaluated. She is having pain bilaterally. She has history of 6 surgeries with Dr. Glenna Fellows. She is concerned she has possibly loosening lately. Maxcine Ham, RT    Assessment & Plan: Visit Diagnoses:  1. Acute bilateral low back pain without sciatica   2. Idiopathic chronic gout of multiple sites without tophus     Plan: We'll repeat a uric acid of uric acid is still below 5 we will decrease to 1 allopurinol every day. She is given a low dose prednisone for her back pain, she will start a 10 mg every morning and may increase to 20 mg if needed. Patient states that she has had difficulty with her nerves wall taking prednisone in the past. We will follow up in 3 months follow-up sooner if she requires follow-up for her lumbar spine.  Follow-Up Instructions: Return in about 3 months (around 06/17/2016).   Ortho Exam Examination patient is alert oriented no adenopathy well-dressed normal affect normal respiratory effort she does have an antalgic gait. Examination she has no radicular symptoms or weakness in either lower extremity. She has no signs or symptoms of acute gout flareup. Radiographs of the lumbar spine shows intact internal fixation. Her pain is primarily the paraspinous muscles of the lumbar spine. ROS: Complete review of systems negative except as mentioned in the history of present illness. Imaging: Xr  Lumbar Spine 2-3 Views  Result Date: 03/17/2016 Two-view radiographs lumbar spine shows stable internal fixation as well as stable intact cage placement. No failure of the hardware no loss of reduction.   Labs: Lab Results  Component Value Date   ESRSEDRATE 21 10/31/2013   LABURIC 4.6 11/27/2015   REPTSTATUS 05/16/2015 FINAL 05/11/2015   CULT NO GROWTH 5 DAYS 05/11/2015    Orders:  Orders Placed This Encounter  Procedures  . XR Lumbar Spine 2-3 Views   Meds ordered this encounter  Medications  . allopurinol (ZYLOPRIM) 100 MG tablet    Sig: Take 1 tablet (100 mg total) by mouth 2 (two) times daily.    Dispense:  60 tablet    Refill:  10  . predniSONE (DELTASONE) 10 MG tablet    Sig: Take 2 tablets (20 mg total) by mouth daily with breakfast.    Dispense:  60 tablet    Refill:  0     Procedures: No procedures performed  Clinical Data: No additional findings.  Subjective: Review of Systems  Objective: Vital Signs: There were no vitals taken for this visit.  Specialty Comments:  No specialty comments available.  PMFS History: Patient Active Problem List   Diagnosis Date Noted  . Acute bilateral low back pain without sciatica 03/17/2016  . Idiopathic chronic gout of multiple sites without  tophus 03/17/2016  . Idiopathic gout of multiple sites 11/27/2015  . Cellulitis 05/15/2015  . Bilateral leg edema 02/08/2015  . Chronic anxiety 11/22/2014  . Cerebrovascular accident (CVA) ([place]) 07/25/2014  . Arterial vascular disease 07/25/2014  . Swelling of right lower extremity 06/22/2014  . Type 2 diabetes mellitus ([place])   . Stroke ([place]) 06/08/2014  . Anemia 06/07/2014  . Unstable angina ([place]) 06/06/2014  . Carotid artery disease ([place])   . CAD (coronary artery disease)   . Hyperlipidemia   . Hypertension   . Angina pectoris ([place]) 05/31/2014  . SOB (shortness of breath) 04/11/2014  . Palpitations 04/11/2014  . Other fatigue 04/11/2014  . Thyroid cancer ([place])  04/11/2014   Past Medical History:  Diagnosis Date  . CAD (coronary artery disease)    a. lexiscan 08/2013: nl wall motion, no ischemia, EF 50-55%; b. cardiac cath 05/31/2014: mLAD 90%, dLAD 50%, dLCx 60%, 1st Mrg 80%, pRCA 60% s/p PCI/DES, mRCA 1st lesion 70%, mRCA 2nd 95% s/p PCI/long DES to cover entire mRCA, RPLB 40%;  c. 05/2014 Staged PCI of LAD w/ 2.5 x 15 mm Xience Alpine DES.  . Carotid artery disease ([place])   . Chronic anxiety   . Diverticulosis   . Esophagitis   . GERD (gastroesophageal reflux disease)   . History of colon polyps   . History of echocardiogram    a. 08/2013: normal LVSF, nl RVSF, no valvular regurgitation or stenosis   . Hyperlipidemia   . Hypertension   . Hypothyroidism   . Mild reactive airways disease   . Osteoarthritis   . PONV (postoperative nausea and vomiting)   . Rheumatoid arthritis ([place])   . Stroke [facility])    a. 05/2014 pt c/o garbled speech following cath 5/19. MRI/A 5/26 showed left caudate infarct->conservative mgmt per neuro.  . Thyroid cancer ([place])    a. s/p right sided hemi-thyroidectomy; b. planning for radioactive iodine tx; c. followed by Dr. Marcelene Butte  . Type 2 diabetes mellitus ([facility])     Family History  Problem Relation Age of Onset  . Heart disease Sister     stent placed x 3     Past Surgical History:  Procedure Laterality Date  . ABDOMINAL HYSTERECTOMY    . APPENDECTOMY    . AUGMENTATION MAMMAPLASTY    . BACK SURGERY    . BREAST IMPLANT REMOVAL  06/2001  . CARDIAC CATHETERIZATION N/A 06/06/2014   Procedure: Coronary/Graft Atherectomy;  Surgeon: Wellington Hampshire, MD;  Location: [place] [facility];  Service: Cardiovascular;  Laterality: N/A;  . CARDIAC CATHETERIZATION N/A 05/31/2014   Procedure: Left Heart Cath;  Surgeon: Minna Merritts, MD;  Location: [place] [facility];  Service: Cardiovascular;  Laterality: N/A;  . CARDIAC CATHETERIZATION N/A 05/31/2014   Procedure: Coronary Stent Intervention;  Surgeon: Wellington Hampshire, MD;   Location: [place] [facility];  Service: Cardiovascular;  Laterality: N/A;  . CARPAL TUNNEL RELEASE     "I've have a total of 7" (06/06/2014)  . CATARACT EXTRACTION W/ INTRAOCULAR LENS  IMPLANT, BILATERAL Bilateral   . CHOLECYSTECTOMY    . CORONARY ANGIOPLASTY WITH STENT PLACEMENT  05/31/2014; 06/06/2014   "2; 1"  . GANGLION CYST EXCISION Right    AC joint  . LUMBAR LAMINECTOMY  X 6  . MENISCUS REPAIR Bilateral    "2 on the right; 1 on the left"  . ROTATOR CUFF REPAIR     "I think 2 left, 2 right"  . THYROIDECTOMY    .  TONSILLECTOMY    . TUBAL LIGATION     Social History   Occupational History  . retired    Social History Main Topics  . Smoking status: Never Smoker  . Smokeless tobacco: Never Used  . Alcohol use No  . Drug use: No  . Sexual activity: Not Currently

## 2016-03-17 NOTE — Addendum Note (Signed)
Addended by: Maxcine Ham on: 03/17/2016 04:47 PM   Modules accepted: Orders

## 2016-03-18 ENCOUNTER — Other Ambulatory Visit
Admission: RE | Admit: 2016-03-18 | Discharge: 2016-03-18 | Disposition: A | Discharge status: Home / Self Care | Payer: Medicare Other | Source: Ambulatory Visit | Attending: Ophthalmology | Admitting: Ophthalmology

## 2016-03-18 DIAGNOSIS — H04339 Acute lacrimal canaliculitis of unspecified lacrimal passage: Secondary | ICD-10-CM | POA: Diagnosis present

## 2016-03-20 LAB — EYE CULTURE

## 2016-03-24 ENCOUNTER — Other Ambulatory Visit: Payer: Self-pay | Admitting: Cardiovascular Disease

## 2016-04-08 LAB — CULTURE, FUNGUS WITHOUT SMEAR

## 2016-04-13 ENCOUNTER — Inpatient Hospital Stay: Payer: Medicare Other | Attending: Internal Medicine

## 2016-04-13 DIAGNOSIS — C73 Malignant neoplasm of thyroid gland: Secondary | ICD-10-CM | POA: Insufficient documentation

## 2016-04-13 DIAGNOSIS — Z86010 Personal history of colonic polyps: Secondary | ICD-10-CM | POA: Insufficient documentation

## 2016-04-13 DIAGNOSIS — I10 Essential (primary) hypertension: Secondary | ICD-10-CM | POA: Insufficient documentation

## 2016-04-13 DIAGNOSIS — N952 Postmenopausal atrophic vaginitis: Secondary | ICD-10-CM | POA: Diagnosis not present

## 2016-04-13 DIAGNOSIS — Z8673 Personal history of transient ischemic attack (TIA), and cerebral infarction without residual deficits: Secondary | ICD-10-CM | POA: Insufficient documentation

## 2016-04-13 DIAGNOSIS — Z79899 Other long term (current) drug therapy: Secondary | ICD-10-CM | POA: Insufficient documentation

## 2016-04-13 DIAGNOSIS — E785 Hyperlipidemia, unspecified: Secondary | ICD-10-CM | POA: Diagnosis not present

## 2016-04-13 DIAGNOSIS — E119 Type 2 diabetes mellitus without complications: Secondary | ICD-10-CM | POA: Diagnosis not present

## 2016-04-13 DIAGNOSIS — F419 Anxiety disorder, unspecified: Secondary | ICD-10-CM | POA: Diagnosis not present

## 2016-04-13 DIAGNOSIS — Z7982 Long term (current) use of aspirin: Secondary | ICD-10-CM | POA: Diagnosis not present

## 2016-04-13 DIAGNOSIS — M069 Rheumatoid arthritis, unspecified: Secondary | ICD-10-CM | POA: Insufficient documentation

## 2016-04-13 DIAGNOSIS — E890 Postprocedural hypothyroidism: Secondary | ICD-10-CM | POA: Diagnosis not present

## 2016-04-13 DIAGNOSIS — K219 Gastro-esophageal reflux disease without esophagitis: Secondary | ICD-10-CM | POA: Insufficient documentation

## 2016-04-13 DIAGNOSIS — I2510 Atherosclerotic heart disease of native coronary artery without angina pectoris: Secondary | ICD-10-CM | POA: Insufficient documentation

## 2016-04-13 DIAGNOSIS — N939 Abnormal uterine and vaginal bleeding, unspecified: Secondary | ICD-10-CM | POA: Insufficient documentation

## 2016-04-13 DIAGNOSIS — D649 Anemia, unspecified: Secondary | ICD-10-CM | POA: Diagnosis not present

## 2016-04-13 DIAGNOSIS — Z955 Presence of coronary angioplasty implant and graft: Secondary | ICD-10-CM | POA: Insufficient documentation

## 2016-04-13 LAB — COMPREHENSIVE METABOLIC PANEL
ALBUMIN: 4.4 g/dL (ref 3.5–5.0)
ALT: 21 U/L (ref 14–54)
ANION GAP: 8 (ref 5–15)
AST: 29 U/L (ref 15–41)
Alkaline Phosphatase: 69 U/L (ref 38–126)
BUN: 24 mg/dL — AB (ref 6–20)
CHLORIDE: 105 mmol/L (ref 101–111)
CO2: 24 mmol/L (ref 22–32)
Calcium: 9.3 mg/dL (ref 8.9–10.3)
Creatinine, Ser: 0.96 mg/dL (ref 0.44–1.00)
GFR calc Af Amer: >60 mL/min (ref >60)
GFR, EST NON AFRICAN AMERICAN: 54 mL/min — AB (ref >60)
GLUCOSE: 114 mg/dL — AB (ref 65–99)
POTASSIUM: 4.7 mmol/L (ref 3.5–5.1)
Sodium: 137 mmol/L (ref 135–145)
Total Bilirubin: 0.4 mg/dL (ref 0.3–1.2)
Total Protein: 7.1 g/dL (ref 6.5–8.1)

## 2016-04-13 LAB — FERRITIN: Ferritin: 41 ng/mL (ref 11–307)

## 2016-04-13 LAB — CBC WITH DIFFERENTIAL/PLATELET
BASOS ABS: 0.1 10*3/uL (ref 0–0.1)
BASOS PCT: 1 %
EOS PCT: 0 %
Eosinophils Absolute: 0 10*3/uL (ref 0–0.7)
HCT: 33 % — ABNORMAL LOW (ref 35.0–47.0)
Hemoglobin: 11.1 g/dL — ABNORMAL LOW (ref 12.0–16.0)
Lymphocytes Relative: 15 %
Lymphs Abs: 1.1 10*3/uL (ref 1.0–3.6)
MCH: 29.8 pg (ref 26.0–34.0)
MCHC: 33.6 g/dL (ref 32.0–36.0)
MCV: 88.8 fL (ref 80.0–100.0)
MONO ABS: 0.5 10*3/uL (ref 0.2–0.9)
Monocytes Relative: 7 %
Neutro Abs: 5.3 10*3/uL (ref 1.4–6.5)
Neutrophils Relative %: 77 %
PLATELETS: 191 10*3/uL (ref 150–440)
RBC: 3.72 MIL/uL — ABNORMAL LOW (ref 3.80–5.20)
RDW: 16 % — AB (ref 11.5–14.5)
WBC: 7 10*3/uL (ref 3.6–11.0)

## 2016-04-13 LAB — IRON AND TIBC
Iron: 95 ug/dL (ref 28–170)
Saturation Ratios: 33 % — ABNORMAL HIGH (ref 10.4–31.8)
TIBC: 288 ug/dL (ref 250–450)
UIBC: 193 ug/dL

## 2016-04-14 LAB — THYROID PANEL WITH TSH
FREE THYROXINE INDEX: 2.2 (ref 1.2–4.9)
T3 Uptake Ratio: 26 % (ref 24–39)
T4 TOTAL: 8.6 ug/dL (ref 4.5–12.0)
TSH: 2.73 u[IU]/mL (ref 0.450–4.500)

## 2016-04-14 LAB — TGAB+THYROGLOBULIN IMA OR RIA

## 2016-04-14 LAB — THYROGLOBULIN BY IMA

## 2016-04-20 ENCOUNTER — Inpatient Hospital Stay (HOSPITAL_BASED_OUTPATIENT_CLINIC_OR_DEPARTMENT_OTHER): Payer: Medicare Other | Admitting: Internal Medicine

## 2016-04-20 VITALS — BP 131/73 | HR 74 | Temp 97.5°F | Resp 18

## 2016-04-20 DIAGNOSIS — E890 Postprocedural hypothyroidism: Secondary | ICD-10-CM | POA: Diagnosis not present

## 2016-04-20 DIAGNOSIS — N952 Postmenopausal atrophic vaginitis: Secondary | ICD-10-CM

## 2016-04-20 DIAGNOSIS — N939 Abnormal uterine and vaginal bleeding, unspecified: Secondary | ICD-10-CM | POA: Diagnosis not present

## 2016-04-20 DIAGNOSIS — Z79899 Other long term (current) drug therapy: Secondary | ICD-10-CM

## 2016-04-20 DIAGNOSIS — D649 Anemia, unspecified: Secondary | ICD-10-CM | POA: Diagnosis not present

## 2016-04-20 DIAGNOSIS — C73 Malignant neoplasm of thyroid gland: Secondary | ICD-10-CM

## 2016-04-20 NOTE — Progress Notes (Signed)
[place] OFFICE PROGRESS NOTE  Patient Care Team: Idelle Crouch, MD as PCP - General (Internal Medicine) Minna Merritts, MD as Consulting Physician (Cardiology)  Cancer Staging Thyroid cancer [facility]) Staging form: Thyroid - Papillary or Follicular, AJCC 7th Edition - Clinical stage from 03/22/2014: Stage III (T3, N0, M0) - Signed by Cammie Sickle, MD on 10/22/2015 Staging form: Thyroid - Papillary or Follicular (Under 45 years), AJCC 7th Edition - Pathologic: No stage assigned - Unsigned    Oncology History   1. 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  0.5 cm tumor extending into perithyroid soft tissue status post total thyroidectomy;   One lymph node was negative.  No angiolymphatic invasion seen [Dr.McQueen]; Intermediate risk of recurrence- goal TSH 0.1- 0.5  # Status post radioactive iodine therapy in May of 2016.  # On Synthroid      Thyroid cancer [facility])      INTERVAL HISTORY:  [name] 83 y.o.  female pleasant patient above history of Stage I papillary thyroid carcinoma is here for follow-up.  Patient also notes to have vaginal bleeding on and off for the last 2 years. Not getting any better or worse; she has not seen gynecologist as recommended at last visit. Denies any weight loss. Denies any lumps or bumps. No tremors.   REVIEW OF SYSTEMS:  A complete 10 point review of system is done which is negative except mentioned above/history of present illness.   PAST MEDICAL HISTORY :  Past Medical History:  Diagnosis Date  . CAD (coronary artery disease)    a. lexiscan 08/2013: nl wall motion, no ischemia, EF 50-55%; b. cardiac cath 05/31/2014: mLAD 90%, dLAD 50%, dLCx 60%, 1st Mrg 80%, pRCA 60% s/p PCI/DES, mRCA 1st lesion 70%, mRCA 2nd 95% s/p PCI/long DES to cover entire mRCA, RPLB 40%;  c. 05/2014 Staged PCI of LAD w/ 2.5 x 15 mm Xience Alpine DES.  . Carotid artery disease ([place])    . Chronic anxiety   . Diverticulosis   . Esophagitis   . GERD (gastroesophageal reflux disease)   . History of colon polyps   . History of echocardiogram    a. 08/2013: normal LVSF, nl RVSF, no valvular regurgitation or stenosis   . Hyperlipidemia   . Hypertension   . Hypothyroidism   . Mild reactive airways disease   . Osteoarthritis   . PONV (postoperative nausea and vomiting)   . Rheumatoid arthritis ([place])   . Stroke [facility])    a. 05/2014 pt c/o garbled speech following cath 5/19. MRI/A 5/26 showed left caudate infarct->conservative mgmt per neuro.  . Thyroid cancer ([place])    a. s/p right sided hemi-thyroidectomy; b. planning for radioactive iodine tx; c. followed by Dr. Marcelene Butte  . Type 2 diabetes mellitus ([place])     PAST SURGICAL HISTORY :   Past Surgical History:  Procedure Laterality Date  . ABDOMINAL HYSTERECTOMY    . APPENDECTOMY    . AUGMENTATION MAMMAPLASTY    . BACK SURGERY    . BREAST IMPLANT REMOVAL  06/2001  . CARDIAC CATHETERIZATION N/A 06/06/2014   Procedure: Coronary/Graft Atherectomy;  Surgeon: Wellington Hampshire, MD;  Location: [place] [facility];  Service: Cardiovascular;  Laterality: N/A;  . CARDIAC CATHETERIZATION N/A 05/31/2014   Procedure: Left Heart Cath;  Surgeon: Minna Merritts, MD;  Location: [place] [facility];  Service: Cardiovascular;  Laterality: N/A;  . CARDIAC CATHETERIZATION N/A 05/31/2014  Procedure: Coronary Stent Intervention;  Surgeon: Wellington Hampshire, MD;  Location: [place] [facility];  Service: Cardiovascular;  Laterality: N/A;  . CARPAL TUNNEL RELEASE     "I've have a total of 7" (06/06/2014)  . CATARACT EXTRACTION W/ INTRAOCULAR LENS  IMPLANT, BILATERAL Bilateral   . CHOLECYSTECTOMY    . CORONARY ANGIOPLASTY WITH STENT PLACEMENT  05/31/2014; 06/06/2014   "2; 1"  . GANGLION CYST EXCISION Right    AC joint  . LUMBAR LAMINECTOMY  X 6  . MENISCUS REPAIR Bilateral    "2 on the right; 1 on the left"  . ROTATOR CUFF REPAIR     "I think 2  left, 2 right"  . THYROIDECTOMY    . TONSILLECTOMY    . TUBAL LIGATION      FAMILY HISTORY :   Family History  Problem Relation Age of Onset  . Heart disease Sister     stent placed x 3     SOCIAL HISTORY:   Social History  Substance Use Topics  . Smoking status: Never Smoker  . Smokeless tobacco: Never Used  . Alcohol use No    ALLERGIES:  is allergic to ciprofloxacin; codeine; metronidazole; sulfa antibiotics; neomycin; bacitracin-polymyxin b; celecoxib; petrolatum-zinc oxide; statins; and vioxx [rofecoxib].  MEDICATIONS:  Current Outpatient Prescriptions  Medication Sig Dispense Refill  . allopurinol (ZYLOPRIM) 100 MG tablet Take 1 tablet (100 mg total) by mouth 2 (two) times daily. 60 tablet 10  . ALPRAZolam (XANAX) 0.25 MG tablet Take 0.25 mg by mouth at bedtime as needed.     . Cholecalciferol 1000 UNITS tablet Take 1,000 Units by mouth daily.     . clopidogrel (PLAVIX) 75 MG tablet TAKE 1 TABLET DAILY 90 tablet 3  . Colchicine (MITIGARE) 0.6 MG CAPS Take 0.6 mg by mouth 2 (two) times daily. 30 capsule 2  . levothyroxine (SYNTHROID, LEVOTHROID) 75 MCG tablet Take 1 tablet by mouth daily.    . metoprolol succinate (TOPROL XL) 50 MG 24 hr tablet Take 1 tablet (50 mg total) by mouth daily. 90 tablet 3  . nitroGLYCERIN (NITROSTAT) 0.4 MG SL tablet PLACE 1 TABLET UNDER THE TONGUE EVERY 5 MINUTES AS NEEDED FOR CHEST PAIN 25 tablet 2  . pantoprazole (PROTONIX) 40 MG tablet TAKE 1 TABLET(40 MG) BY MOUTH DAILY 30 tablet 3  . potassium chloride (K-DUR) 10 MEQ tablet TAKE 1 TABLET BY MOUTH AS NEEDED. TAKE WITH FUROSEMIDE 30 tablet 3  . ramipril (ALTACE) 10 MG capsule TAKE 1 CAPSULE(10 MG) BY MOUTH DAILY 30 capsule 3  . aspirin EC 81 MG tablet Take 81 mg by mouth daily.     . Turmeric 500 MG CAPS Take 500 mg by mouth daily.     No current facility-administered medications for this visit.     PHYSICAL EXAMINATION: ECOG PERFORMANCE STATUS: 0 - Asymptomatic  BP 131/73   Pulse 74    Temp 97.5 F (36.4 C) (Tympanic)   Resp 18   There were no vitals filed for this visit.  GENERAL: Well-nourished well-developed; Alert, no distress and comfortable.   Alone. Walks with a cane. EYES: no pallor or icterus OROPHARYNX: no thrush or ulceration; good dentition  NECK: supple, no masses felt LYMPH:  no palpable lymphadenopathy in the cervical, axillary or inguinal regions LUNGS: clear to auscultation and  No wheeze or crackles HEART/CVS: regular rate & rhythm and no murmurs; No lower extremity edema ABDOMEN:abdomen soft, non-tender and normal bowel sounds Musculoskeletal:no cyanosis of digits and no clubbing  PSYCH: alert & oriented x 3 with fluent speech NEURO: no focal motor/sensory deficits SKIN:  no rashes or significant lesions  LABORATORY DATA:  I have reviewed the data as listed    Component Value Date/Time   NA 137 04/13/2016 1440   NA 139 05/04/2014 1058   K 4.7 04/13/2016 1440   K 4.5 05/04/2014 1058   CL 105 04/13/2016 1440   CL 105 05/04/2014 1058   CO2 24 04/13/2016 1440   CO2 26 05/04/2014 1058   GLUCOSE 114 (H) 04/13/2016 1440   GLUCOSE 161 (H) 05/04/2014 1058   BUN 24 (H) 04/13/2016 1440   BUN 20 05/04/2014 1058   CREATININE 0.96 04/13/2016 1440   CREATININE 1.10 (H) 05/04/2014 1058   CALCIUM 9.3 04/13/2016 1440   CALCIUM 9.5 05/04/2014 1058   PROT 7.1 04/13/2016 1440   PROT 6.9 05/04/2014 1058   ALBUMIN 4.4 04/13/2016 1440   ALBUMIN 4.1 05/04/2014 1058   AST 29 04/13/2016 1440   AST 26 05/04/2014 1058   ALT 21 04/13/2016 1440   ALT 17 05/04/2014 1058   ALKPHOS 69 04/13/2016 1440   ALKPHOS 52 05/04/2014 1058   BILITOT 0.4 04/13/2016 1440   BILITOT 0.6 05/04/2014 1058   GFRNONAA 54 (L) 04/13/2016 1440   GFRNONAA 48 (L) 05/04/2014 1058   GFRAA >60 04/13/2016 1440   GFRAA 56 (L) 05/04/2014 1058    No results found for: SPEP, UPEP  Lab Results  Component Value Date   WBC 7.0 04/13/2016   NEUTROABS 5.3 04/13/2016   HGB 11.1 (L)  04/13/2016   HCT 33.0 (L) 04/13/2016   MCV 88.8 04/13/2016   PLT 191 04/13/2016      Chemistry      Component Value Date/Time   NA 137 04/13/2016 1440   NA 139 05/04/2014 1058   K 4.7 04/13/2016 1440   K 4.5 05/04/2014 1058   CL 105 04/13/2016 1440   CL 105 05/04/2014 1058   CO2 24 04/13/2016 1440   CO2 26 05/04/2014 1058   BUN 24 (H) 04/13/2016 1440   BUN 20 05/04/2014 1058   CREATININE 0.96 04/13/2016 1440   CREATININE 1.10 (H) 05/04/2014 1058      Component Value Date/Time   CALCIUM 9.3 04/13/2016 1440   CALCIUM 9.5 05/04/2014 1058   ALKPHOS 69 04/13/2016 1440   ALKPHOS 52 05/04/2014 1058   AST 29 04/13/2016 1440   AST 26 05/04/2014 1058   ALT 21 04/13/2016 1440   ALT 17 05/04/2014 1058   BILITOT 0.4 04/13/2016 1440   BILITOT 0.6 05/04/2014 1058       RADIOGRAPHIC STUDIES: I have personally reviewed the radiological images as listed and agreed with the findings in the report. No results found.   ASSESSMENT & PLAN:  Thyroid cancer ([place]) Thyroid cancer- stage III status post resection followed by radioactive iodine; patient's Synthroid.   # Hypothyroidism /TSH suppression -currently on Synthoid 75 mcg/ patient's goal TSH for intermediate risk- 0.1-0.5; however given her age I think it's reasonable to continue the current dose. TSH -2.7 [N]  # vaginal bleeding- x2 years; ? Atrophic vaginitis;  recommend gynecology- once a month [had hysterectomy-30 years ago.] recommend gynecology.  # Mild anemia hemoglobin 11 chronic-stable; iron studies- N.   # follow up in  43months/labs-thyroid tumor marker/labs/thyroid profile-few days prior.    Orders Placed This Encounter  Procedures  . CBC with Differential    Standing Status:   Future    Standing Expiration Date:  04/20/2017  . Comprehensive metabolic panel    Standing Status:   Future    Standing Expiration Date:   04/20/2017  . TgAb+Thyroglobulin IMA or RIA    Standing Status:   Future    Standing Expiration  Date:   04/20/2017  . Thyroid Panel With TSH    Standing Status:   Future    Standing Expiration Date:   04/20/2017   All questions were answered. The patient knows to call the clinic with any problems, questions or concerns.      Cammie Sickle, MD 04/20/2016 4:58 PM

## 2016-04-20 NOTE — Assessment & Plan Note (Addendum)
Thyroid cancer- stage III status post resection followed by radioactive iodine; patient's Synthroid.   # Hypothyroidism /TSH suppression -currently on Synthoid 75 mcg/ patient's goal TSH for intermediate risk- 0.1-0.5; however given her age I think it's reasonable to continue the current dose. TSH -2.7 [N]  # vaginal bleeding- x2 years; ? Atrophic vaginitis;  recommend gynecology- once a month [had hysterectomy-30 years ago.] recommend gynecology.  # Mild anemia hemoglobin 11 chronic-stable; iron studies- N.   # follow up in  8months/labs-thyroid tumor marker/labs/thyroid profile-few days prior.

## 2016-05-17 ENCOUNTER — Other Ambulatory Visit: Payer: Self-pay | Admitting: Cardiovascular Disease

## 2016-06-14 ENCOUNTER — Other Ambulatory Visit: Payer: Self-pay | Admitting: Cardiovascular Disease

## 2016-06-15 ENCOUNTER — Ambulatory Visit (INDEPENDENT_AMBULATORY_CARE_PROVIDER_SITE_OTHER): Payer: Medicare Other | Admitting: Orthopedic Surgery

## 2016-06-22 ENCOUNTER — Ambulatory Visit (INDEPENDENT_AMBULATORY_CARE_PROVIDER_SITE_OTHER): Payer: Medicare Other | Admitting: Orthopedic Surgery

## 2016-06-22 DIAGNOSIS — M1A09X0 Idiopathic chronic gout, multiple sites, without tophus (tophi): Secondary | ICD-10-CM

## 2016-06-22 DIAGNOSIS — M25551 Pain in right hip: Secondary | ICD-10-CM | POA: Diagnosis not present

## 2016-06-22 NOTE — Progress Notes (Signed)
Office Visit Note   Patient: [name]           Date of Birth: Dec 23, 1933           MRN: [id number] Visit Date: 06/22/2016              Requested by: Idelle Crouch, MD [place], [place] [address] PCP: Idelle Crouch, MD  No chief complaint on file.     HPI: The patient is a 83 year old woman seen in follow up for multiple issues. Has gout of multiple sites, states feet are most common sites. Reports has had 5 or so flares in last 6 months. Did drop down to Allopurinol once daily following last visit 3 months ago. Is happy with this change. Uses colchicine for flares. Is not taking Prednisone, states has had no return of sciatica. No back pain or radicular symptoms today.   Is complaining of right hip pain today. This is constant. Points to greater trochanter. Worse with ambulation. No relieving factors. Has not tried taking   Assessment & Plan: Visit Diagnoses:  1. Idiopathic chronic gout of multiple sites without tophus   2. Pain of right hip joint     Plan: We will redraw her uric acid today she'll follow-up in office in 6 months sooner should any concerns arise. Did offer her a cortisone and injection for her bursitis patient declined today will use ibuprofen or Aleve.  Follow-Up Instructions: No Follow-up on file.   Right Hip Exam   Tenderness  The patient is experiencing tenderness in the lateral and greater trochanter.  Range of Motion  The patient has normal right hip ROM.  Muscle Strength  The patient has normal right hip strength.  Tests  FABER: negative  Comments:  Flexion and extension and external rotation and internal rotation do not elicit pain   Back Exam   Tenderness  The patient is experiencing no tenderness.   Range of Motion  The patient has normal back ROM.      Patient is alert, oriented, no adenopathy, well-dressed, normal affect, normal respiratory effort. Stable gait.    Imaging: No results found.  Labs: Lab Results  Component Value Date   ESRSEDRATE 21 10/31/2013   LABURIC 4.4 03/17/2016   LABURIC 4.6 11/27/2015   REPTSTATUS 03/20/2016 FINAL 03/18/2016   REPTSTATUS 04/08/2016 FINAL 03/18/2016   CULT  03/18/2016    MODERATE STAPHYLOCOCCUS SPECIES (COAGULASE NEGATIVE) CALL MICROBIOLOGY LAB IF SENSITIVITIES ARE REQUIRED. Performed at [facility], [place] [address], [place], [place] [address]    CULT  03/18/2016    NO FUNGUS ISOLATED AFTER 21 DAYS Performed at [facility], [place] [address], [place], [place] [address]     Orders:  No orders of the defined types were placed in this encounter.  No orders of the defined types were placed in this encounter.    Procedures: No procedures performed  Clinical Data: No additional findings.  ROS:  All other systems negative, except as noted in the HPI. Review of Systems  Constitutional: Negative for chills and fever.  Musculoskeletal: Positive for arthralgias. Negative for back pain, gait problem, joint swelling and myalgias.  Neurological: Negative for weakness and numbness.    Objective: Vital Signs: There were no vitals taken for this visit.  Specialty Comments:  No specialty comments available.  PMFS History: Patient Active Problem List   Diagnosis Date Noted  . Acute bilateral low back  pain without sciatica 03/17/2016  . Idiopathic chronic gout of multiple sites without tophus 03/17/2016  . Idiopathic gout of multiple sites 11/27/2015  . Cellulitis 05/15/2015  . Bilateral leg edema 02/08/2015  . Chronic anxiety 11/22/2014  . Cerebrovascular accident (CVA) ([place]) 07/25/2014  . Arterial vascular disease 07/25/2014  . Swelling of right lower extremity 06/22/2014  . Type 2 diabetes mellitus ([place])   . Stroke ([place]) 06/08/2014  . Anemia 06/07/2014  . Unstable angina ([place]) 06/06/2014  . Carotid artery disease ([place])   . CAD (coronary artery disease)   . Hyperlipidemia   .  Hypertension   . Angina pectoris ([place]) 05/31/2014  . SOB (shortness of breath) 04/11/2014  . Palpitations 04/11/2014  . Other fatigue 04/11/2014  . Thyroid cancer ([place]) 04/11/2014   Past Medical History:  Diagnosis Date  . CAD (coronary artery disease)    a. lexiscan 08/2013: nl wall motion, no ischemia, EF 50-55%; b. cardiac cath 05/31/2014: mLAD 90%, dLAD 50%, dLCx 60%, 1st Mrg 80%, pRCA 60% s/p PCI/DES, mRCA 1st lesion 70%, mRCA 2nd 95% s/p PCI/long DES to cover entire mRCA, RPLB 40%;  c. 05/2014 Staged PCI of LAD w/ 2.5 x 15 mm Xience Alpine DES.  . Carotid artery disease ([place])   . Chronic anxiety   . Diverticulosis   . Esophagitis   . GERD (gastroesophageal reflux disease)   . History of colon polyps   . History of echocardiogram    a. 08/2013: normal LVSF, nl RVSF, no valvular regurgitation or stenosis   . Hyperlipidemia   . Hypertension   . Hypothyroidism   . Mild reactive airways disease   . Osteoarthritis   . PONV (postoperative nausea and vomiting)   . Rheumatoid arthritis ([place])   . Stroke [facility])    a. 05/2014 pt c/o garbled speech following cath 5/19. MRI/A 5/26 showed left caudate infarct->conservative mgmt per neuro.  . Thyroid cancer ([place])    a. s/p right sided hemi-thyroidectomy; b. planning for radioactive iodine tx; c. followed by Dr. Marcelene Butte  . Type 2 diabetes mellitus ([facility])     Family History  Problem Relation Age of Onset  . Heart disease Sister        stent placed x 3     Past Surgical History:  Procedure Laterality Date  . ABDOMINAL HYSTERECTOMY    . APPENDECTOMY    . AUGMENTATION MAMMAPLASTY    . BACK SURGERY    . BREAST IMPLANT REMOVAL  06/2001  . CARDIAC CATHETERIZATION N/A 06/06/2014   Procedure: Coronary/Graft Atherectomy;  Surgeon: Wellington Hampshire, MD;  Location: [place] CV LAB;  Service: Cardiovascular;  Laterality: N/A;  . CARDIAC CATHETERIZATION N/A 05/31/2014   Procedure: Left Heart Cath;  Surgeon: Minna Merritts, MD;  Location: [place]  CV LAB;  Service: Cardiovascular;  Laterality: N/A;  . CARDIAC CATHETERIZATION N/A 05/31/2014   Procedure: Coronary Stent Intervention;  Surgeon: Wellington Hampshire, MD;  Location: [place] CV LAB;  Service: Cardiovascular;  Laterality: N/A;  . CARPAL TUNNEL RELEASE     "I've have a total of 7" (06/06/2014)  . CATARACT EXTRACTION W/ INTRAOCULAR LENS  IMPLANT, BILATERAL Bilateral   . CHOLECYSTECTOMY    . CORONARY ANGIOPLASTY WITH STENT PLACEMENT  05/31/2014; 06/06/2014   "2; 1"  . GANGLION CYST EXCISION Right    AC joint  . LUMBAR LAMINECTOMY  X 6  . MENISCUS REPAIR Bilateral    "2 on the right; 1 on the left"  . ROTATOR CUFF REPAIR     "  I think 2 left, 2 right"  . THYROIDECTOMY    . TONSILLECTOMY    . TUBAL LIGATION     Social History   Occupational History  . retired    Social History Main Topics  . Smoking status: Never Smoker  . Smokeless tobacco: Never Used  . Alcohol use No  . Drug use: No  . Sexual activity: Not Currently

## 2016-06-23 LAB — URIC ACID: Uric Acid, Serum: 5.2 mg/dL (ref 2.5–7.0)

## 2016-07-21 ENCOUNTER — Other Ambulatory Visit: Payer: Self-pay | Admitting: Cardiovascular Disease

## 2016-08-19 ENCOUNTER — Other Ambulatory Visit: Payer: Self-pay | Admitting: Cardiovascular Disease

## 2016-08-20 ENCOUNTER — Other Ambulatory Visit: Payer: Self-pay | Admitting: Cardiovascular Disease

## 2016-09-10 IMAGING — CR DG KNEE COMPLETE 4+V*R*
4 series · 4 of 4 positions shown · non-contrast
Comparison: None.

CLINICAL DATA: Status post fall at home on patio, with right knee
pain, bruising and swelling. Initial encounter.

EXAM:
RIGHT KNEE - COMPLETE 4+ VIEW

[knee ap]
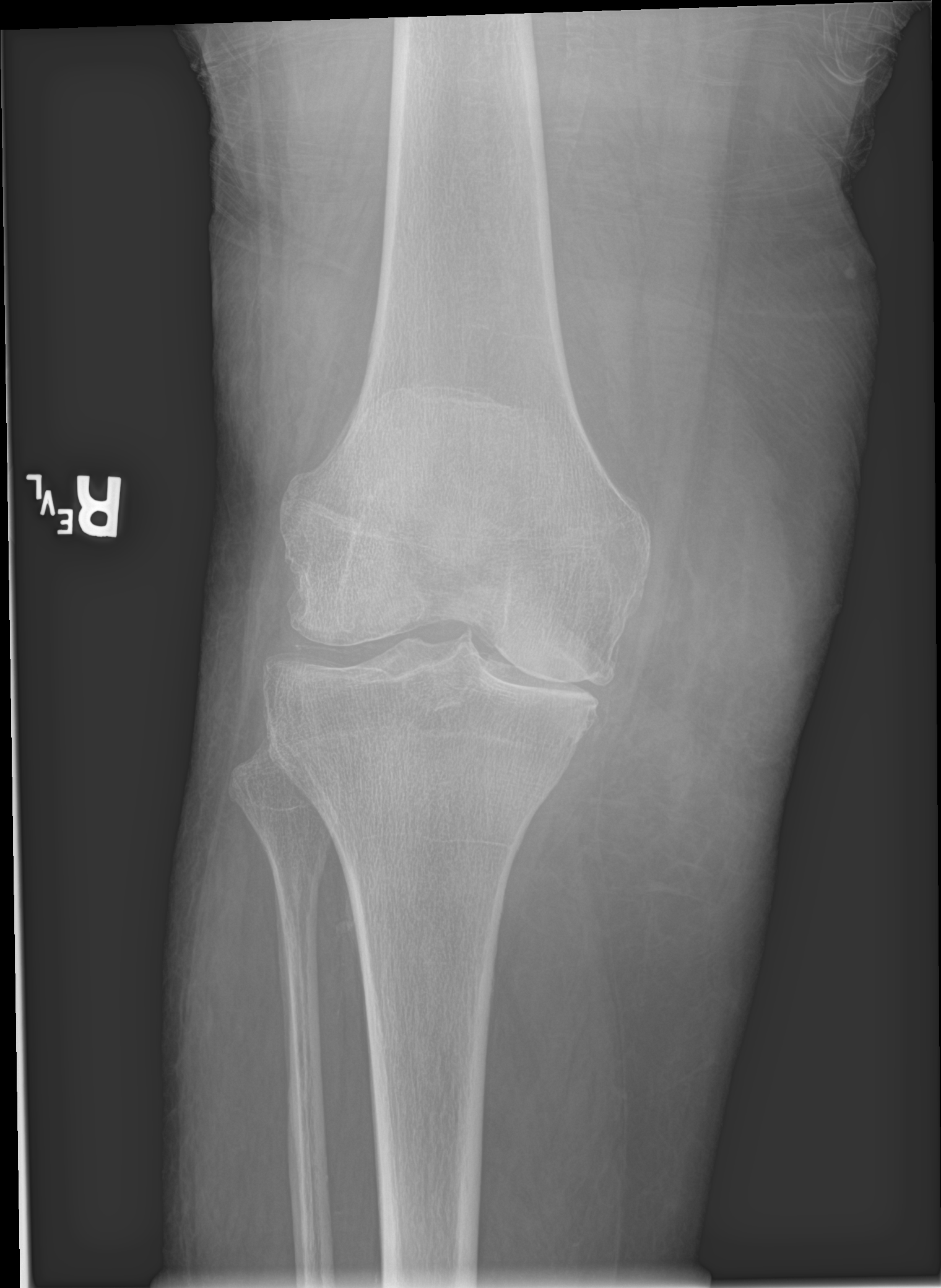

[knee obl (1 of 2)]
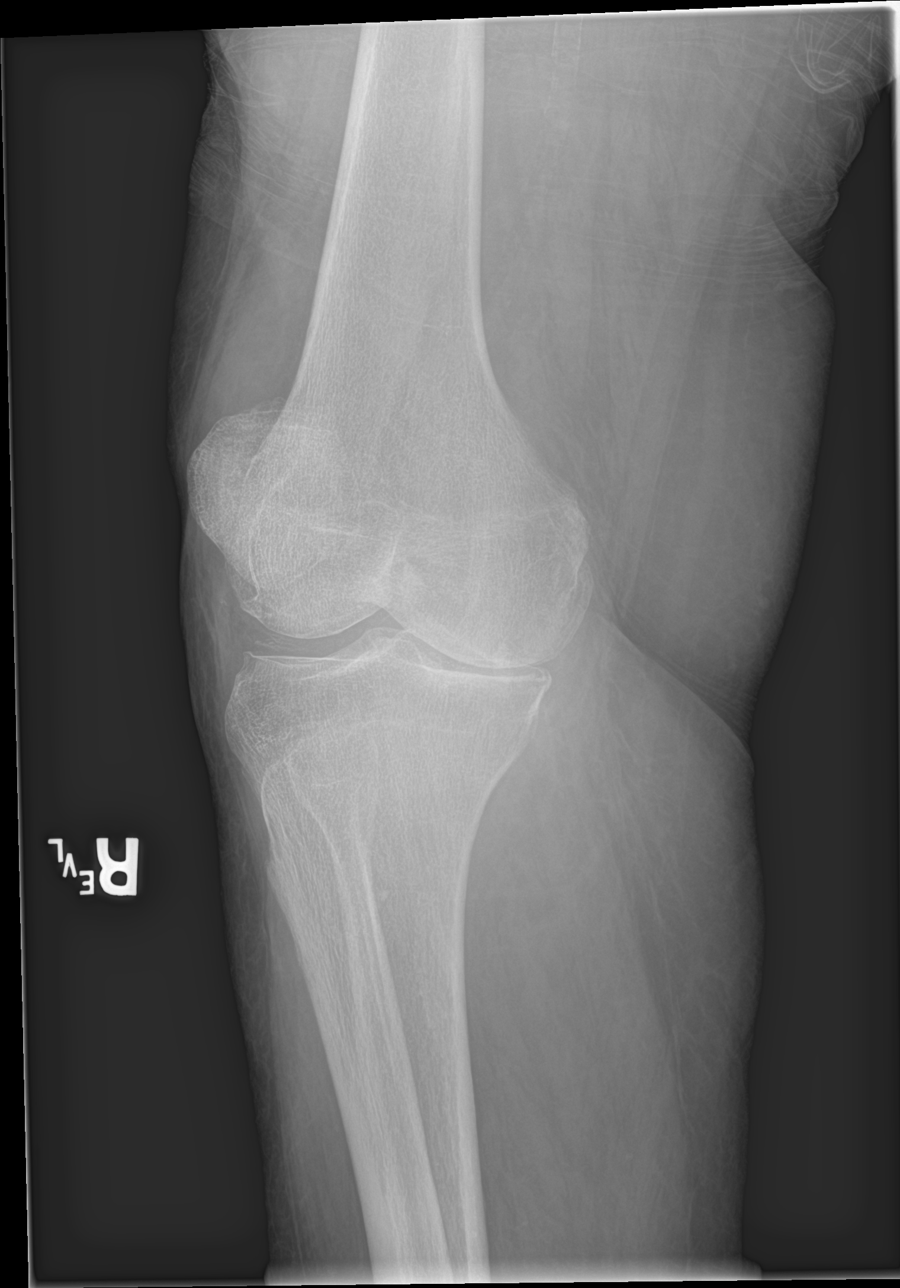

[knee obl (2 of 2)]
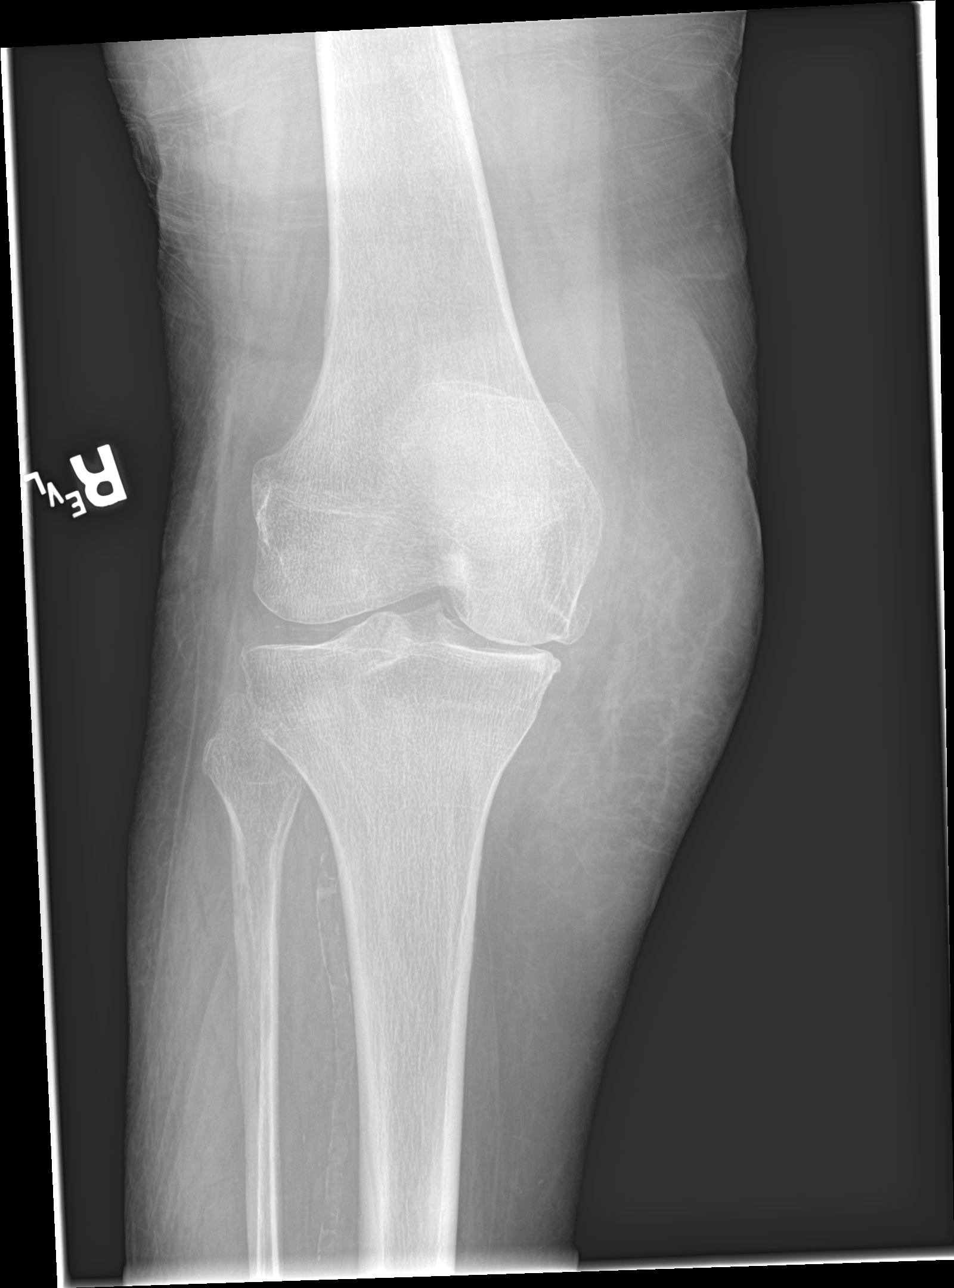

[knee lat]
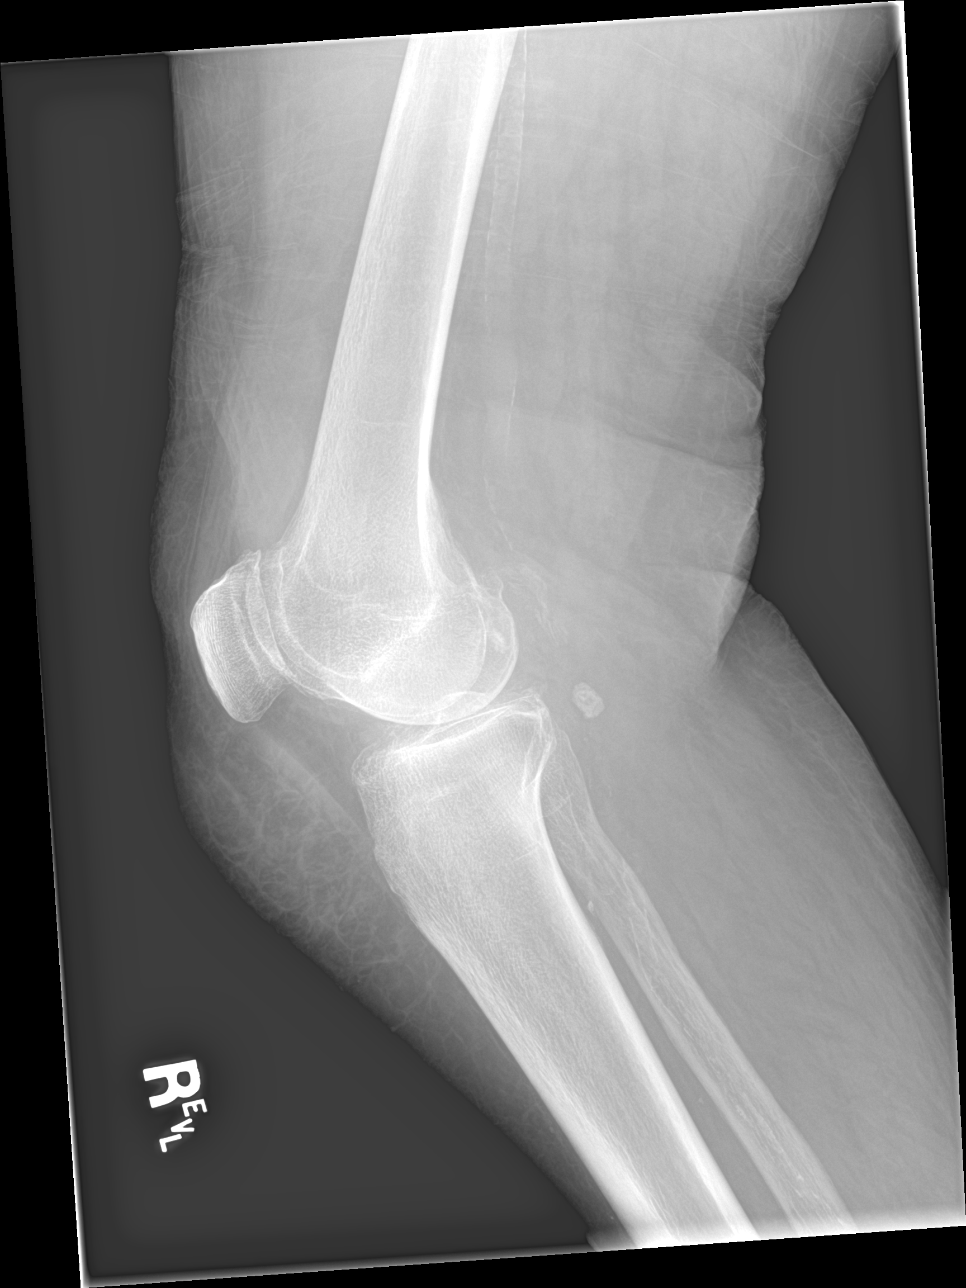

[4 of 4 positions shown; findings below may reference images not displayed]

FINDINGS: There is no evidence of fracture or dislocation. There is mild
narrowing of the medial compartment. Marginal osteophytes are seen
arising at the medial and lateral compartments. A fabella is noted.

No significant joint effusion is seen. Scattered vascular
calcifications are seen. Soft tissue swelling is noted inferior to
the patella.
IMPRESSION: 1. No evidence of fracture or dislocation.
2. Mild osteoarthritis noted, with mild narrowing of the medial
compartment.
3. Soft tissue swelling inferior to the patella.
4. Scattered vascular calcifications seen.

## 2016-09-10 IMAGING — CT CT HEAD W/O CM
4 of 8 series · 16 of 47 positions shown, 18 images · non-contrast
Comparison: MRI head 06/07/2014

CLINICAL DATA: Fall.  Head injury.

EXAM:
CT HEAD WITHOUT CONTRAST
CT MAXILLOFACIAL WITHOUT CONTRAST
CT CERVICAL SPINE WITHOUT CONTRAST
TECHNIQUE: Multidetector CT imaging of the head, cervical spine, and
maxillofacial structures were performed using the standard protocol
without intravenous contrast. Multiplanar CT image reconstructions
of the cervical spine and maxillofacial structures were also
generated.

[Series 3: max soft · axial · 0.33mm/px · z∈[-275,-149]mm · 8 of 81 slices shown, 10 images]
[im 9/81  brain]
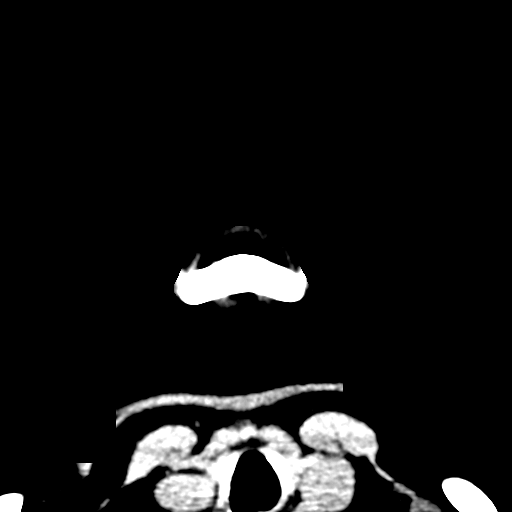
[im 9/81  bone]
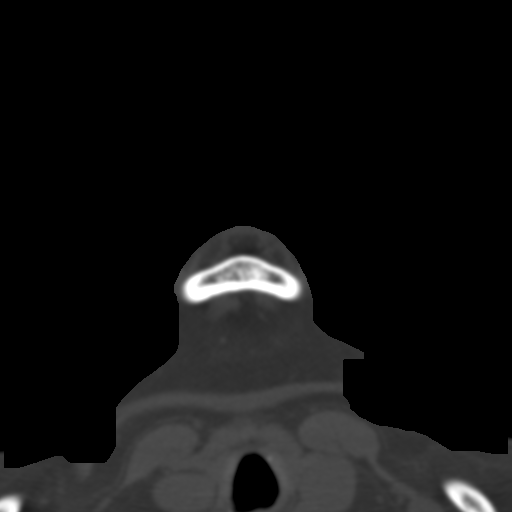
[im 18/81  brain]
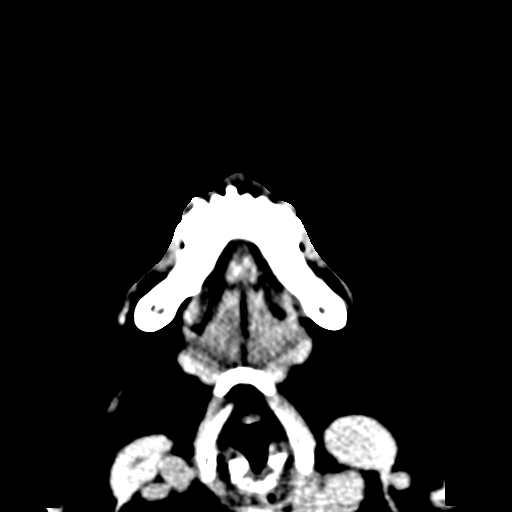
[im 27/81  brain]
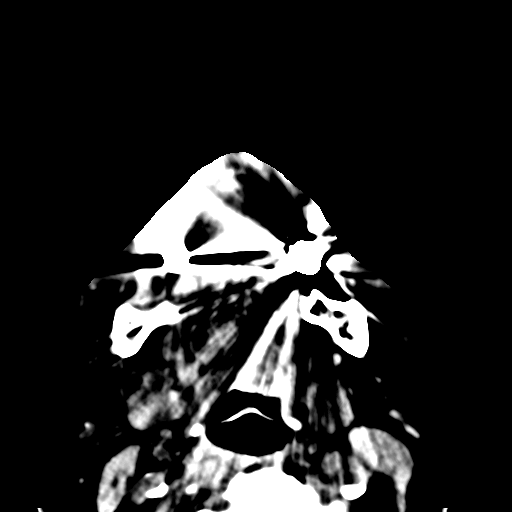
[im 36/81  brain]
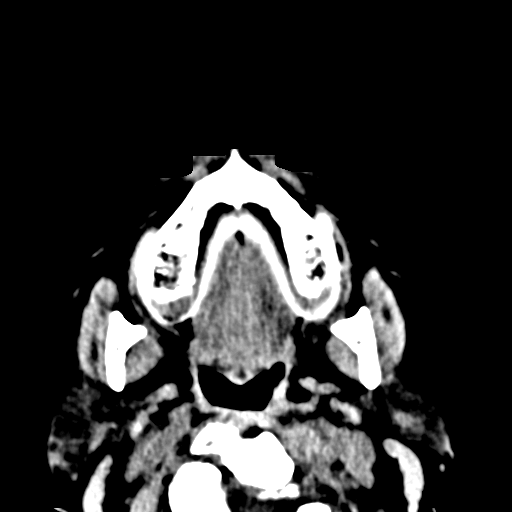
[im 45/81  brain]
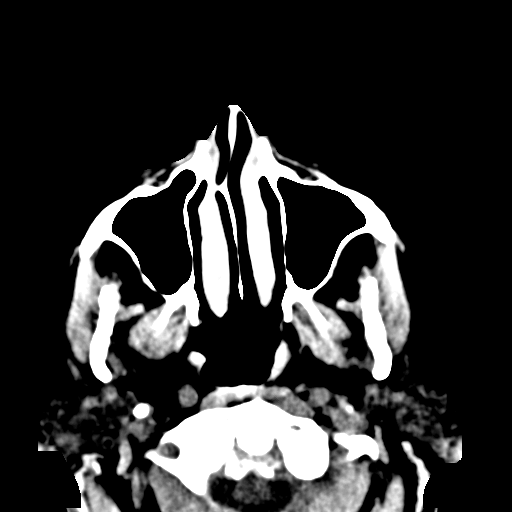
[im 45/81  bone]
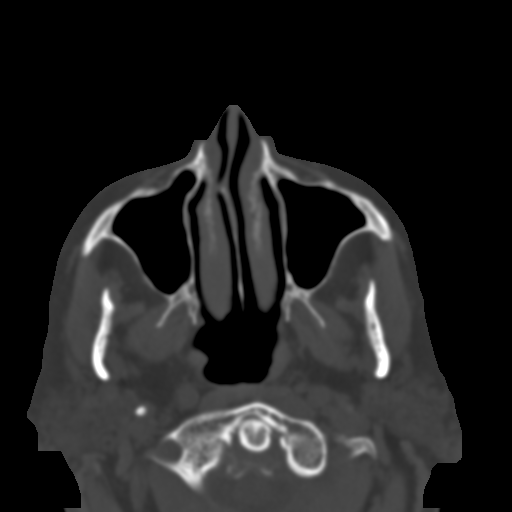
[im 54/81  brain]
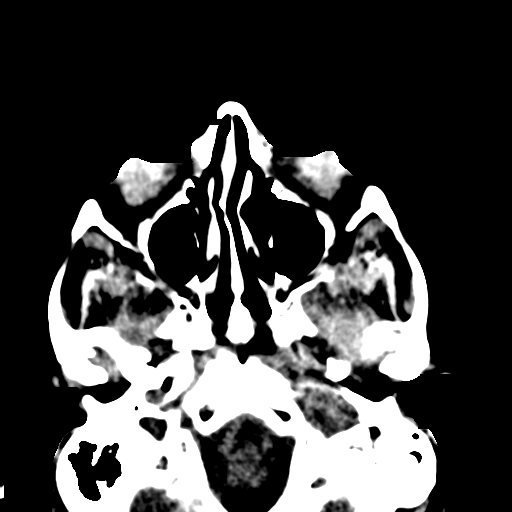
[im 63/81  brain]
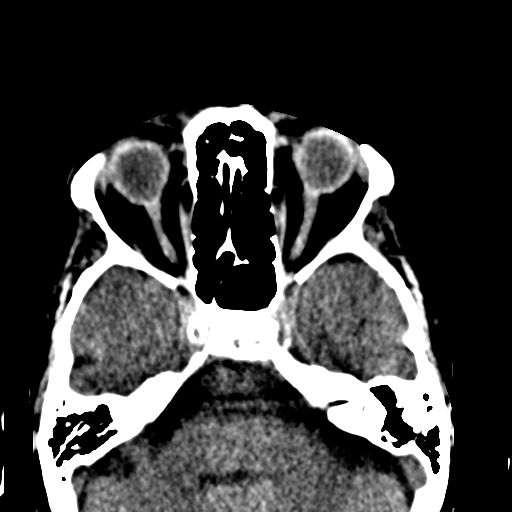
[im 72/81  brain]
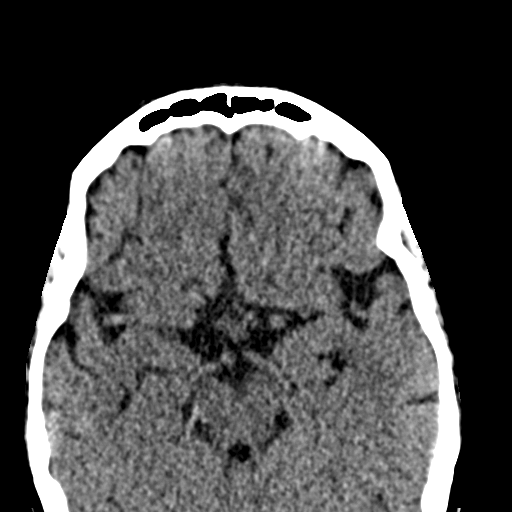

[Series 6: coronal soft · coronal · 0.31mm/px · 3 of 80 slices shown]
[im 20/80  brain]
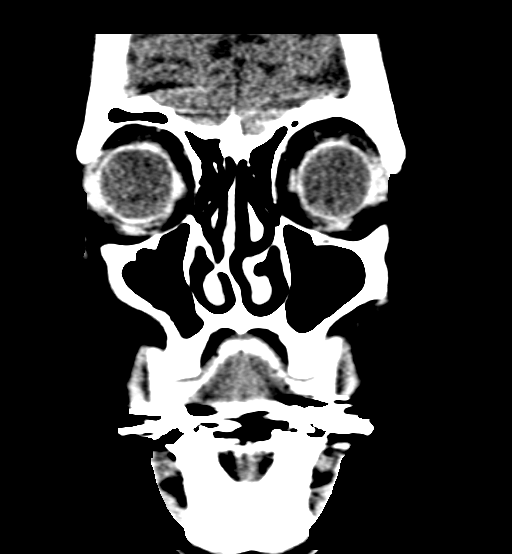
[im 40/80  brain]
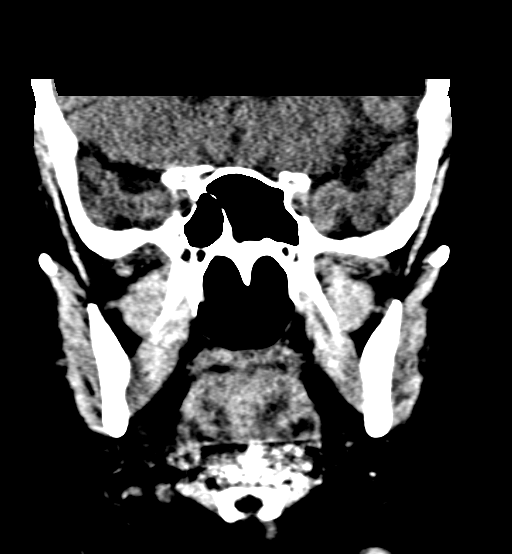
[im 60/80  brain]
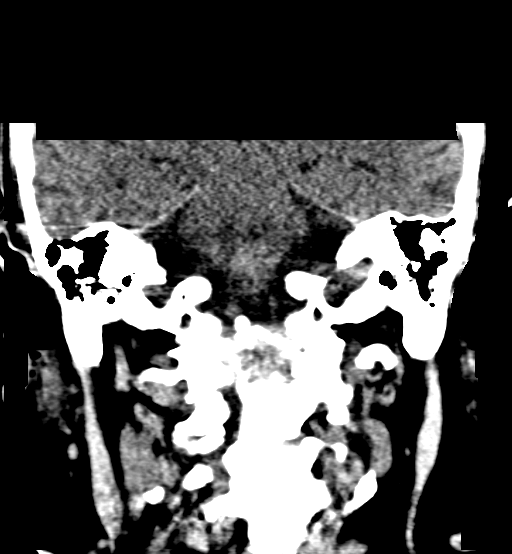

[Series 7: sagittal soft · sagittal · 0.33mm/px · 2 of 71 slices shown]
[im 24/71  brain]
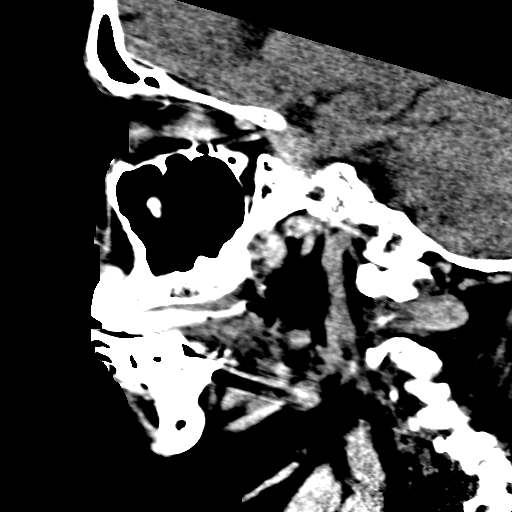
[im 47/71  brain]
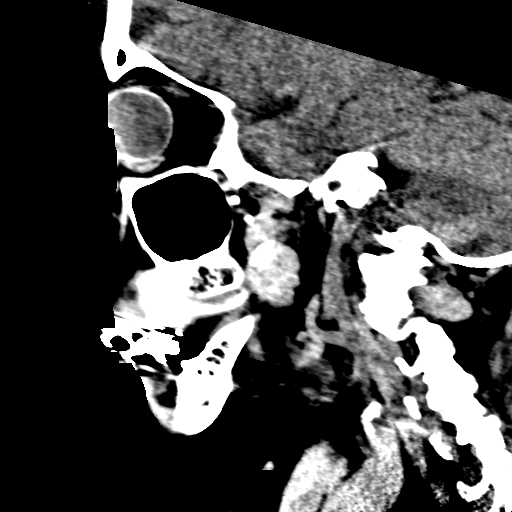

[Series 11: soft tissue · axial · 0.36mm/px · z∈[-310,-272]mm · 3 of 78 slices shown]
[im 10/78  brain]
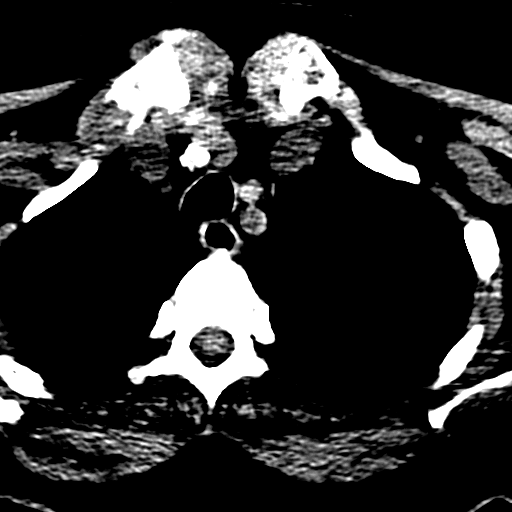
[im 20/78  brain]
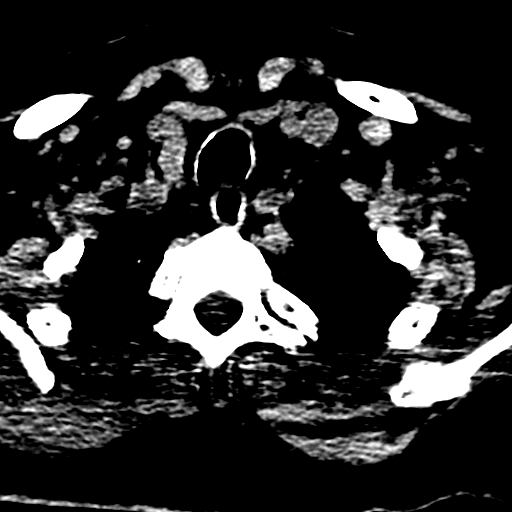
[im 29/78  brain]
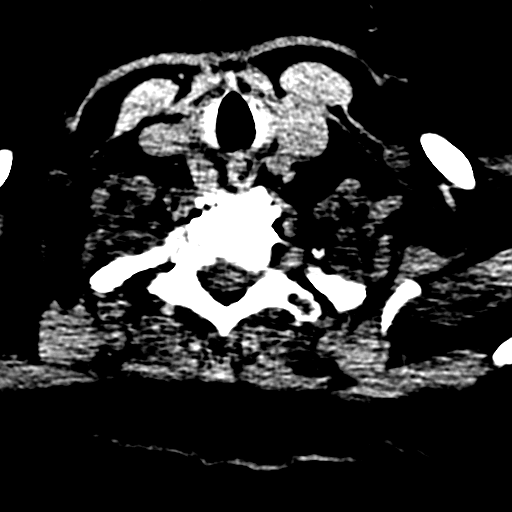

[16 of 47 positions shown; findings below may reference images not displayed]

FINDINGS: CT HEAD FINDINGS

Moderate atrophy. Chronic microvascular ischemic changes in the
white matter.

Negative for acute infarct.

Negative for intracranial hemorrhage.  Negative for mass lesion.

Negative for skull fracture.  Frontal scalp hematoma.

CT MAXILLOFACIAL FINDINGS

Negative for facial fracture. No fracture of the orbit or mandible.
No fracture of the nasal bone or nasal septum. Nasal septum deviated
to the right. No fracture the maxillary sinus. Zygomatic arch
intact.

Mild mucosal edema in the paranasal sinuses. No air-fluid level.
Advanced degenerative change in the TMJ bilaterally.

CT CERVICAL SPINE FINDINGS

Advanced degenerative change. Extensive calcified pannus behind the
dens. There is cystic change in the dense related to degenerative
change. Disc degeneration and spurring at C3-4. 4 mm anterior slip
C4-5 appears degenerative with advanced facet degeneration. Mild
anterior slip C5-6. Advanced disc degeneration and spurring at C6-7.
Diffuse facet degeneration throughout the cervical spine.

Negative for cervical spine fracture

Negative for mass lesion.  Lung apices clear.
IMPRESSION: No acute intracranial abnormality.  Moderate atrophy

Frontal scalp hematoma.  Negative for skull fracture

Negative for facial fracture

Advanced degenerative change in the cervical spine. Negative for
fracture of the cervical spine.

## 2016-09-21 ENCOUNTER — Other Ambulatory Visit: Payer: Self-pay | Admitting: Cardiovascular Disease

## 2016-10-12 ENCOUNTER — Inpatient Hospital Stay: Payer: Medicare Other | Attending: Internal Medicine

## 2016-10-12 DIAGNOSIS — E039 Hypothyroidism, unspecified: Secondary | ICD-10-CM | POA: Diagnosis not present

## 2016-10-12 DIAGNOSIS — Z86010 Personal history of colonic polyps: Secondary | ICD-10-CM | POA: Diagnosis not present

## 2016-10-12 DIAGNOSIS — Z7982 Long term (current) use of aspirin: Secondary | ICD-10-CM | POA: Diagnosis not present

## 2016-10-12 DIAGNOSIS — I10 Essential (primary) hypertension: Secondary | ICD-10-CM | POA: Insufficient documentation

## 2016-10-12 DIAGNOSIS — Z955 Presence of coronary angioplasty implant and graft: Secondary | ICD-10-CM | POA: Insufficient documentation

## 2016-10-12 DIAGNOSIS — I2510 Atherosclerotic heart disease of native coronary artery without angina pectoris: Secondary | ICD-10-CM | POA: Insufficient documentation

## 2016-10-12 DIAGNOSIS — Z85850 Personal history of malignant neoplasm of thyroid: Secondary | ICD-10-CM | POA: Diagnosis not present

## 2016-10-12 DIAGNOSIS — Z8673 Personal history of transient ischemic attack (TIA), and cerebral infarction without residual deficits: Secondary | ICD-10-CM | POA: Insufficient documentation

## 2016-10-12 DIAGNOSIS — M069 Rheumatoid arthritis, unspecified: Secondary | ICD-10-CM | POA: Diagnosis not present

## 2016-10-12 DIAGNOSIS — Z9841 Cataract extraction status, right eye: Secondary | ICD-10-CM | POA: Diagnosis not present

## 2016-10-12 DIAGNOSIS — J45909 Unspecified asthma, uncomplicated: Secondary | ICD-10-CM | POA: Diagnosis not present

## 2016-10-12 DIAGNOSIS — Z79899 Other long term (current) drug therapy: Secondary | ICD-10-CM | POA: Diagnosis not present

## 2016-10-12 DIAGNOSIS — N898 Other specified noninflammatory disorders of vagina: Secondary | ICD-10-CM | POA: Insufficient documentation

## 2016-10-12 DIAGNOSIS — K219 Gastro-esophageal reflux disease without esophagitis: Secondary | ICD-10-CM | POA: Insufficient documentation

## 2016-10-12 DIAGNOSIS — E785 Hyperlipidemia, unspecified: Secondary | ICD-10-CM | POA: Insufficient documentation

## 2016-10-12 DIAGNOSIS — D649 Anemia, unspecified: Secondary | ICD-10-CM | POA: Diagnosis not present

## 2016-10-12 DIAGNOSIS — C73 Malignant neoplasm of thyroid gland: Secondary | ICD-10-CM

## 2016-10-12 LAB — COMPREHENSIVE METABOLIC PANEL
ALBUMIN: 3.9 g/dL (ref 3.5–5.0)
ALK PHOS: 73 U/L (ref 38–126)
ALT: 14 U/L (ref 14–54)
ANION GAP: 9 (ref 5–15)
AST: 24 U/L (ref 15–41)
BUN: 23 mg/dL — AB (ref 6–20)
CO2: 25 mmol/L (ref 22–32)
CREATININE: 0.66 mg/dL (ref 0.44–1.00)
Calcium: 9.5 mg/dL (ref 8.9–10.3)
Chloride: 107 mmol/L (ref 101–111)
GFR calc Af Amer: >60 mL/min (ref >60)
GFR calc non Af Amer: >60 mL/min (ref >60)
GLUCOSE: 92 mg/dL (ref 65–99)
POTASSIUM: 4.7 mmol/L (ref 3.5–5.1)
SODIUM: 141 mmol/L (ref 135–145)
Total Bilirubin: 0.4 mg/dL (ref 0.3–1.2)
Total Protein: 7 g/dL (ref 6.5–8.1)

## 2016-10-12 LAB — CBC WITH DIFFERENTIAL/PLATELET
BASOS PCT: 1 %
Basophils Absolute: 0 10*3/uL (ref 0–0.1)
EOS ABS: 0 10*3/uL (ref 0–0.7)
Eosinophils Relative: 0 %
HCT: 31.1 % — ABNORMAL LOW (ref 35.0–47.0)
HEMOGLOBIN: 10.5 g/dL — AB (ref 12.0–16.0)
Lymphocytes Relative: 19 %
Lymphs Abs: 1 10*3/uL (ref 1.0–3.6)
MCH: 30.3 pg (ref 26.0–34.0)
MCHC: 33.8 g/dL (ref 32.0–36.0)
MCV: 89.5 fL (ref 80.0–100.0)
Monocytes Absolute: 0.4 10*3/uL (ref 0.2–0.9)
Monocytes Relative: 8 %
NEUTROS PCT: 72 %
Neutro Abs: 3.8 10*3/uL (ref 1.4–6.5)
Platelets: 179 10*3/uL (ref 150–440)
RBC: 3.47 MIL/uL — ABNORMAL LOW (ref 3.80–5.20)
RDW: 14.7 % — AB (ref 11.5–14.5)
WBC: 5.2 10*3/uL (ref 3.6–11.0)

## 2016-10-13 LAB — THYROID PANEL WITH TSH
FREE THYROXINE INDEX: 2.3 (ref 1.2–4.9)
T3 UPTAKE RATIO: 25 % (ref 24–39)
T4 TOTAL: 9 ug/dL (ref 4.5–12.0)
TSH: 4.52 u[IU]/mL — ABNORMAL HIGH (ref 0.450–4.500)

## 2016-10-13 LAB — TGAB+THYROGLOBULIN IMA OR RIA: Thyroglobulin Antibody: <1 IU/mL (ref 0.0–0.9)

## 2016-10-13 LAB — THYROGLOBULIN BY IMA

## 2016-10-19 ENCOUNTER — Inpatient Hospital Stay (HOSPITAL_BASED_OUTPATIENT_CLINIC_OR_DEPARTMENT_OTHER): Payer: Medicare Other | Admitting: Internal Medicine

## 2016-10-19 VITALS — BP 135/73 | HR 72 | Temp 97.6°F | Resp 20 | Ht 63.0 in | Wt 140.0 lb

## 2016-10-19 DIAGNOSIS — E039 Hypothyroidism, unspecified: Secondary | ICD-10-CM | POA: Diagnosis not present

## 2016-10-19 DIAGNOSIS — Z79899 Other long term (current) drug therapy: Secondary | ICD-10-CM

## 2016-10-19 DIAGNOSIS — D649 Anemia, unspecified: Secondary | ICD-10-CM

## 2016-10-19 DIAGNOSIS — N898 Other specified noninflammatory disorders of vagina: Secondary | ICD-10-CM

## 2016-10-19 DIAGNOSIS — C73 Malignant neoplasm of thyroid gland: Secondary | ICD-10-CM

## 2016-10-19 DIAGNOSIS — Z85850 Personal history of malignant neoplasm of thyroid: Secondary | ICD-10-CM | POA: Diagnosis not present

## 2016-10-19 MED ORDER — FLUCONAZOLE 100 MG PO TABS: 100.0000 mg | ORAL_TABLET | Freq: Every day | ORAL | 0 refills | Status: DC

## 2016-10-19 MED ORDER — LEVOTHYROXINE SODIUM 88 MCG PO TABS: 88.0000 ug | ORAL_TABLET | Freq: Every day | ORAL | 6 refills | Status: DC

## 2016-10-19 NOTE — Assessment & Plan Note (Addendum)
Thyroid cancer- stage III status post resection followed by radioactive iodine; patient's Synthroid. Clinically no recurrence of recurrence. Thyroglobulin is normal.  # Hypothyroidism /TSH suppression -currently on Synthoid 75 mcg [Goal: TSH for intermediate risk- 0.1-0.5] TSH - slightly elevated at 4.5.  Recommend increasing Synthroid to 88 g- given her age I would target a: Normal TSH.   # vaginal discharge-  Atrophic vaginitis;  recommend gynecology- once a month [had hysterectomy-30 years ago.] recommend diflucan 100 mg/day x7 days.   # Mild anemia hemoglobin 10.5 chronic-stable; iron studies- N. we will recheck again in 6 months. If continues to get worse; then recommend a bone marrow biopsy.  # follow up in  9month/labs-thyroid tumor marker/labs/thyroid profile-few days prior.

## 2016-10-19 NOTE — Progress Notes (Signed)
Patient here for follow-up for h/o thyroid cancer.

## 2016-10-19 NOTE — Progress Notes (Signed)
[place] OFFICE PROGRESS NOTE  Patient Care Team: Idelle Crouch, MD as PCP - General (Internal Medicine) Rockey Situ Kathlene November, MD as Consulting Physician (Cardiology)  Cancer Staging Thyroid cancer [facility]) Staging form: Thyroid - Papillary or Follicular, AJCC 7th Edition - Clinical stage from 03/22/2014: Stage III (T3, N0, M0) - Signed by Cammie Sickle, MD on 10/22/2015 Staging form: Thyroid - Papillary or Follicular (Under 45 years), AJCC 7th Edition - Pathologic: No stage assigned - Unsigned    Oncology History   1. 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  0.5 cm tumor extending into perithyroid soft tissue status post total thyroidectomy;   One lymph node was negative.  No angiolymphatic invasion seen [Dr.McQueen]; Intermediate risk of recurrence- goal TSH 0.1- 0.5  # Status post radioactive iodine therapy in May of 2016.  # On Synthroid      Thyroid cancer [facility])      INTERVAL HISTORY:  [name] 83 y.o.  female pleasant patient above history of Stage III papillary thyroid carcinoma is here for follow-up.   As per the husband patient continues to have intermittent vaginal discharge. She has not followed up with gynecologist recommend the past. Patient denies any new lumps or bumps. Appetite is good. No shortness of breath or cough. No chest pain. No nausea no vomiting or headaches.  Complains of ongoing fatigue. No tremors. No palpitations.  REVIEW OF SYSTEMS:  A complete 10 point review of system is done which is negative except mentioned above/history of present illness.   PAST MEDICAL HISTORY :  Past Medical History:  Diagnosis Date  . CAD (coronary artery disease)    a. lexiscan 08/2013: nl wall motion, no ischemia, EF 50-55%; b. cardiac cath 05/31/2014: mLAD 90%, dLAD 50%, dLCx 60%, 1st Mrg 80%, pRCA 60% s/p PCI/DES, mRCA 1st lesion 70%, mRCA 2nd 95% s/p PCI/long DES to cover entire  mRCA, RPLB 40%;  c. 05/2014 Staged PCI of LAD w/ 2.5 x 15 mm Xience Alpine DES.  . Carotid artery disease ([place])   . Chronic anxiety   . Diverticulosis   . Esophagitis   . GERD (gastroesophageal reflux disease)   . History of colon polyps   . History of echocardiogram    a. 08/2013: normal LVSF, nl RVSF, no valvular regurgitation or stenosis   . Hyperlipidemia   . Hypertension   . Hypothyroidism   . Mild reactive airways disease   . Osteoarthritis   . PONV (postoperative nausea and vomiting)   . Rheumatoid arthritis ([place])   . Stroke [facility])    a. 05/2014 pt c/o garbled speech following cath 5/19. MRI/A 5/26 showed left caudate infarct->conservative mgmt per neuro.  . Thyroid cancer ([place])    a. s/p right sided hemi-thyroidectomy; b. planning for radioactive iodine tx; c. followed by Dr. Marcelene Butte  . Type 2 diabetes mellitus ([place])     PAST SURGICAL HISTORY :   Past Surgical History:  Procedure Laterality Date  . ABDOMINAL HYSTERECTOMY    . APPENDECTOMY    . AUGMENTATION MAMMAPLASTY    . BACK SURGERY    . BREAST IMPLANT REMOVAL  06/2001  . CARDIAC CATHETERIZATION N/A 06/06/2014   Procedure: Coronary/Graft Atherectomy;  Surgeon: Wellington Hampshire, MD;  Location: [place] CV LAB;  Service: Cardiovascular;  Laterality: N/A;  . CARDIAC CATHETERIZATION N/A 05/31/2014   Procedure: Left Heart Cath;  Surgeon: Minna Merritts, MD;  Location: [place] INVASIVE CV  LAB;  Service: Cardiovascular;  Laterality: N/A;  . CARDIAC CATHETERIZATION N/A 05/31/2014   Procedure: Coronary Stent Intervention;  Surgeon: Wellington Hampshire, MD;  Location: [place] CV LAB;  Service: Cardiovascular;  Laterality: N/A;  . CARPAL TUNNEL RELEASE     "I've have a total of 7" (06/06/2014)  . CATARACT EXTRACTION W/ INTRAOCULAR LENS  IMPLANT, BILATERAL Bilateral   . CHOLECYSTECTOMY    . CORONARY ANGIOPLASTY WITH STENT PLACEMENT  05/31/2014; 06/06/2014   "2; 1"  . GANGLION CYST EXCISION Right    AC joint  . LUMBAR LAMINECTOMY   X 6  . MENISCUS REPAIR Bilateral    "2 on the right; 1 on the left"  . ROTATOR CUFF REPAIR     "I think 2 left, 2 right"  . THYROIDECTOMY    . TONSILLECTOMY    . TUBAL LIGATION      FAMILY HISTORY :   Family History  Problem Relation Age of Onset  . Heart disease Sister        stent placed x 3     SOCIAL HISTORY:   Social History  Substance Use Topics  . Smoking status: Never Smoker  . Smokeless tobacco: Never Used  . Alcohol use No    ALLERGIES:  is allergic to ciprofloxacin; codeine; metronidazole; sulfa antibiotics; neomycin; neomycin-bacitracin zn-polymyx; bacitracin-polymyxin b; celecoxib; petrolatum-zinc oxide; statins; and vioxx [rofecoxib].  MEDICATIONS:  Current Outpatient Prescriptions  Medication Sig Dispense Refill  . allopurinol (ZYLOPRIM) 100 MG tablet Take 1 tablet (100 mg total) by mouth 2 (two) times daily. 60 tablet 10  . ALPRAZolam (XANAX) 0.25 MG tablet Take 0.25 mg by mouth at bedtime as needed for anxiety or sleep.     . Cholecalciferol 1000 UNITS tablet Take 1,000 Units by mouth daily.     . clopidogrel (PLAVIX) 75 MG tablet TAKE 1 TABLET DAILY 90 tablet 3  . Colchicine (MITIGARE) 0.6 MG CAPS Take 0.6 mg by mouth 2 (two) times daily. 30 capsule 2  . levothyroxine (SYNTHROID, LEVOTHROID) 88 MCG tablet Take 1 tablet (88 mcg total) by mouth daily. 30 tablet 6  . metoprolol succinate (TOPROL XL) 50 MG 24 hr tablet Take 1 tablet (50 mg total) by mouth daily. 90 tablet 3  . pantoprazole (PROTONIX) 40 MG tablet TAKE 1 TABLET(40 MG) BY MOUTH DAILY 30 tablet 2  . ramipril (ALTACE) 10 MG capsule TAKE 1 CAPSULE(10 MG) BY MOUTH DAILY 30 capsule 3  . Turmeric 500 MG CAPS Take 500 mg by mouth daily.    Marland Kitchen aspirin EC 81 MG tablet Take 81 mg by mouth daily.     . fluconazole (DIFLUCAN) 100 MG tablet Take 1 tablet (100 mg total) by mouth daily. 7 tablet 0  . nitroGLYCERIN (NITROSTAT) 0.4 MG SL tablet PLACE 1 TABLET UNDER THE TONGUE EVERY 5 MINUTES AS NEEDED FOR CHEST  PAIN (Patient not taking: Reported on 10/19/2016) 25 tablet 2   No current facility-administered medications for this visit.     PHYSICAL EXAMINATION: ECOG PERFORMANCE STATUS: 0 - Asymptomatic  BP 135/73   Pulse 72   Temp 97.6 F (36.4 C) (Tympanic)   Resp 20   Ht '5\' 3"'$  (1.6 m)   Wt 140 lb (63.5 kg)   BMI 24.80 kg/m   Filed Weights   10/19/16 1413  Weight: 140 lb (63.5 kg)    GENERAL: Well-nourished well-developed; Alert, no distress and comfortable. Accompanied by her husband. Walks with a cane. EYES: no pallor or icterus OROPHARYNX: no thrush  or ulceration; good dentition  NECK: supple, no masses felt LYMPH:  no palpable lymphadenopathy in the cervical, axillary or inguinal regions LUNGS: clear to auscultation and  No wheeze or crackles HEART/CVS: regular rate & rhythm and no murmurs; No lower extremity edema ABDOMEN:abdomen soft, non-tender and normal bowel sounds Musculoskeletal:no cyanosis of digits and no clubbing  PSYCH: alert & oriented x 3 with fluent speech NEURO: no focal motor/sensory deficits SKIN:  no rashes or significant lesions  LABORATORY DATA:  I have reviewed the data as listed    Component Value Date/Time   NA 141 10/12/2016 1145   NA 139 05/04/2014 1058   K 4.7 10/12/2016 1145   K 4.5 05/04/2014 1058   CL 107 10/12/2016 1145   CL 105 05/04/2014 1058   CO2 25 10/12/2016 1145   CO2 26 05/04/2014 1058   GLUCOSE 92 10/12/2016 1145   GLUCOSE 161 (H) 05/04/2014 1058   BUN 23 (H) 10/12/2016 1145   BUN 20 05/04/2014 1058   CREATININE 0.66 10/12/2016 1145   CREATININE 1.10 (H) 05/04/2014 1058   CALCIUM 9.5 10/12/2016 1145   CALCIUM 9.5 05/04/2014 1058   PROT 7.0 10/12/2016 1145   PROT 6.9 05/04/2014 1058   ALBUMIN 3.9 10/12/2016 1145   ALBUMIN 4.1 05/04/2014 1058   AST 24 10/12/2016 1145   AST 26 05/04/2014 1058   ALT 14 10/12/2016 1145   ALT 17 05/04/2014 1058   ALKPHOS 73 10/12/2016 1145   ALKPHOS 52 05/04/2014 1058   BILITOT 0.4  10/12/2016 1145   BILITOT 0.6 05/04/2014 1058   GFRNONAA >60 10/12/2016 1145   GFRNONAA 48 (L) 05/04/2014 1058   GFRAA >60 10/12/2016 1145   GFRAA 56 (L) 05/04/2014 1058    No results found for: SPEP, UPEP  Lab Results  Component Value Date   WBC 5.2 10/12/2016   NEUTROABS 3.8 10/12/2016   HGB 10.5 (L) 10/12/2016   HCT 31.1 (L) 10/12/2016   MCV 89.5 10/12/2016   PLT 179 10/12/2016      Chemistry      Component Value Date/Time   NA 141 10/12/2016 1145   NA 139 05/04/2014 1058   K 4.7 10/12/2016 1145   K 4.5 05/04/2014 1058   CL 107 10/12/2016 1145   CL 105 05/04/2014 1058   CO2 25 10/12/2016 1145   CO2 26 05/04/2014 1058   BUN 23 (H) 10/12/2016 1145   BUN 20 05/04/2014 1058   CREATININE 0.66 10/12/2016 1145   CREATININE 1.10 (H) 05/04/2014 1058      Component Value Date/Time   CALCIUM 9.5 10/12/2016 1145   CALCIUM 9.5 05/04/2014 1058   ALKPHOS 73 10/12/2016 1145   ALKPHOS 52 05/04/2014 1058   AST 24 10/12/2016 1145   AST 26 05/04/2014 1058   ALT 14 10/12/2016 1145   ALT 17 05/04/2014 1058   BILITOT 0.4 10/12/2016 1145   BILITOT 0.6 05/04/2014 1058       RADIOGRAPHIC STUDIES: I have personally reviewed the radiological images as listed and agreed with the findings in the report. No results found.   ASSESSMENT & PLAN:  Thyroid cancer ([place]) Thyroid cancer- stage III status post resection followed by radioactive iodine; patient's Synthroid. Clinically no recurrence of recurrence. Thyroglobulin is normal.  # Hypothyroidism /TSH suppression -currently on Synthoid 75 mcg [Goal: TSH for intermediate risk- 0.1-0.5] TSH - slightly elevated at 4.5.  Recommend increasing Synthroid to 88 g- given her age I would target a: Normal TSH.   # vaginal discharge-  Atrophic vaginitis;  recommend gynecology- once a month [had hysterectomy-30 years ago.] recommend diflucan 100 mg/day x7 days.   # Mild anemia hemoglobin 10.5 chronic-stable; iron studies- N. we will recheck  again in 6 months. If continues to get worse; then recommend a bone marrow biopsy.  # follow up in  18month/labs-thyroid tumor marker/labs/thyroid profile-few days prior.    Orders Placed This Encounter  Procedures  . CBC with Differential/Platelet    Standing Status:   Future    Standing Expiration Date:   10/19/2017  . Comprehensive metabolic panel    Standing Status:   Future    Standing Expiration Date:   10/19/2017  . Thyroid Panel With TSH    Standing Status:   Future    Standing Expiration Date:   10/19/2017  . TgAb+Thyroglobulin IMA or RIA    Standing Status:   Future    Standing Expiration Date:   10/19/2017   All questions were answered. The patient knows to call the clinic with any problems, questions or concerns.      GCammie Sickle MD 10/19/2016 4:17 PM

## 2016-10-23 ENCOUNTER — Encounter: Payer: Self-pay | Admitting: Emergency Medicine

## 2016-10-23 ENCOUNTER — Emergency Department
Admission: EM | Admit: 2016-10-23 | Discharge: 2016-10-23 | Disposition: A | Discharge status: Home / Self Care | Payer: Medicare Other | Attending: Emergency Medicine | Admitting: Emergency Medicine

## 2016-10-23 DIAGNOSIS — I2510 Atherosclerotic heart disease of native coronary artery without angina pectoris: Secondary | ICD-10-CM | POA: Diagnosis not present

## 2016-10-23 DIAGNOSIS — M069 Rheumatoid arthritis, unspecified: Secondary | ICD-10-CM | POA: Insufficient documentation

## 2016-10-23 DIAGNOSIS — Z7901 Long term (current) use of anticoagulants: Secondary | ICD-10-CM | POA: Insufficient documentation

## 2016-10-23 DIAGNOSIS — I10 Essential (primary) hypertension: Secondary | ICD-10-CM | POA: Insufficient documentation

## 2016-10-23 DIAGNOSIS — Z79899 Other long term (current) drug therapy: Secondary | ICD-10-CM | POA: Insufficient documentation

## 2016-10-23 DIAGNOSIS — Y92014 Private driveway to single-family (private) house as the place of occurrence of the external cause: Secondary | ICD-10-CM | POA: Diagnosis not present

## 2016-10-23 DIAGNOSIS — E039 Hypothyroidism, unspecified: Secondary | ICD-10-CM | POA: Insufficient documentation

## 2016-10-23 DIAGNOSIS — Y999 Unspecified external cause status: Secondary | ICD-10-CM | POA: Diagnosis not present

## 2016-10-23 DIAGNOSIS — Z7982 Long term (current) use of aspirin: Secondary | ICD-10-CM | POA: Insufficient documentation

## 2016-10-23 DIAGNOSIS — S81811A Laceration without foreign body, right lower leg, initial encounter: Secondary | ICD-10-CM | POA: Insufficient documentation

## 2016-10-23 DIAGNOSIS — X58XXXA Exposure to other specified factors, initial encounter: Secondary | ICD-10-CM | POA: Diagnosis not present

## 2016-10-23 DIAGNOSIS — Y939 Activity, unspecified: Secondary | ICD-10-CM | POA: Diagnosis not present

## 2016-10-23 DIAGNOSIS — Z23 Encounter for immunization: Secondary | ICD-10-CM | POA: Insufficient documentation

## 2016-10-23 MED ORDER — TETANUS-DIPHTH-ACELL PERTUSSIS 5-2.5-18.5 LF-MCG/0.5 IM SUSP
0.5000 mL | Freq: Once | INTRAMUSCULAR | Status: AC
  Administered 2016-10-23: 0.5 mL via INTRAMUSCULAR
  Filled 2016-10-23: qty 0.5

## 2016-10-23 MED ORDER — CEPHALEXIN 500 MG PO CAPS
500.0000 mg | ORAL_CAPSULE | Freq: Once | ORAL | Status: AC
  Administered 2016-10-23: 500 mg via ORAL
  Filled 2016-10-23: qty 1

## 2016-10-23 MED ORDER — CEPHALEXIN 500 MG PO CAPS: 500.0000 mg | ORAL_CAPSULE | Freq: Three times a day (TID) | ORAL | 0 refills | Status: AC
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
## 2016-10-23 NOTE — ED Notes (Signed)
Laceration to right lower leg from car door. Pt c/o intermittent pain, but is tolerable.  Pt states she is on plavix.
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
## 2016-10-25 ENCOUNTER — Encounter: Payer: Self-pay | Admitting: Emergency Medicine

## 2016-10-25 ENCOUNTER — Emergency Department
Admission: EM | Admit: 2016-10-25 | Discharge: 2016-10-25 | Disposition: A | Discharge status: Home / Self Care | Payer: Medicare Other | Attending: Emergency Medicine | Admitting: Emergency Medicine

## 2016-10-25 DIAGNOSIS — Z955 Presence of coronary angioplasty implant and graft: Secondary | ICD-10-CM | POA: Insufficient documentation

## 2016-10-25 DIAGNOSIS — I10 Essential (primary) hypertension: Secondary | ICD-10-CM | POA: Insufficient documentation

## 2016-10-25 DIAGNOSIS — Z8673 Personal history of transient ischemic attack (TIA), and cerebral infarction without residual deficits: Secondary | ICD-10-CM | POA: Diagnosis not present

## 2016-10-25 DIAGNOSIS — Z5189 Encounter for other specified aftercare: Secondary | ICD-10-CM | POA: Diagnosis not present

## 2016-10-25 DIAGNOSIS — Z7982 Long term (current) use of aspirin: Secondary | ICD-10-CM | POA: Diagnosis not present

## 2016-10-25 DIAGNOSIS — S81811D Laceration without foreign body, right lower leg, subsequent encounter: Secondary | ICD-10-CM | POA: Insufficient documentation

## 2016-10-25 DIAGNOSIS — S81811A Laceration without foreign body, right lower leg, initial encounter: Secondary | ICD-10-CM

## 2016-10-25 DIAGNOSIS — E119 Type 2 diabetes mellitus without complications: Secondary | ICD-10-CM | POA: Insufficient documentation

## 2016-10-25 DIAGNOSIS — X58XXXD Exposure to other specified factors, subsequent encounter: Secondary | ICD-10-CM | POA: Diagnosis not present

## 2016-10-25 DIAGNOSIS — I6529 Occlusion and stenosis of unspecified carotid artery: Secondary | ICD-10-CM | POA: Diagnosis not present

## 2016-10-25 DIAGNOSIS — Z7901 Long term (current) use of anticoagulants: Secondary | ICD-10-CM | POA: Diagnosis not present

## 2016-10-25 DIAGNOSIS — I2510 Atherosclerotic heart disease of native coronary artery without angina pectoris: Secondary | ICD-10-CM | POA: Insufficient documentation

## 2016-10-25 DIAGNOSIS — Z85850 Personal history of malignant neoplasm of thyroid: Secondary | ICD-10-CM | POA: Diagnosis not present

## 2016-10-25 DIAGNOSIS — M069 Rheumatoid arthritis, unspecified: Secondary | ICD-10-CM | POA: Insufficient documentation

## 2016-10-25 DIAGNOSIS — E039 Hypothyroidism, unspecified: Secondary | ICD-10-CM | POA: Diagnosis not present

## 2016-10-25 NOTE — ED Notes (Signed)
Pt was seen Friday night (skintears on right lower leg) - she was told if the areas started bleeding agin to come back - pt is back for skin tear recheck

## 2016-10-25 NOTE — ED Triage Notes (Addendum)
Pt was here yesterday for skin tear. Reports thinks bleeding again and was told to come back if does; on plavix. Old drainage on dressing but do not see active bleeding.  Guaze seems stuck to wound and would likely need saline to remove and new non-adherant dressing.

## 2016-10-25 NOTE — ED Triage Notes (Signed)
FIRST NURSE NOTE-pt seen here for skin tear yesterday and told return if starts bleeding and reports it started again. Ambulatory without difficulty.

## 2016-10-25 NOTE — Discharge Instructions (Signed)
Keep The area as dry as possible. Keep the leg elevated as much as possible. Use Vaseline gauze to prevent the bandage from sticking. Watch for signs of infection. If the area becomes red and there is pus please follow-up with your doctor as soon as possible or return to the emergency department. If you have any questions please call your doctor or return to the emergency department

## 2016-10-25 NOTE — ED Provider Notes (Signed)
[facility] Emergency Department Provider Note  ____________________________________________   First MD Initiated Contact with Patient 10/25/16 1357     (approximate)  I have reviewed the triage vital signs and the nursing notes.   HISTORY  Chief Complaint skin tear    HPI [name] is a 83 y.o. female Who has a skin tear to the right lower leg. She was seen in the emergency department and the leg was repaired with Dermabond. A bandage was applied on top of the Dermabond. Patient is concerned because the area began to bleed again.She does not change the bandage due to the blue.   Past Medical History:  Diagnosis Date  . CAD (coronary artery disease)    a. lexiscan 08/2013: nl wall motion, no ischemia, EF 50-55%; b. cardiac cath 05/31/2014: mLAD 90%, dLAD 50%, dLCx 60%, 1st Mrg 80%, pRCA 60% s/p PCI/DES, mRCA 1st lesion 70%, mRCA 2nd 95% s/p PCI/long DES to cover entire mRCA, RPLB 40%;  c. 05/2014 Staged PCI of LAD w/ 2.5 x 15 mm Xience Alpine DES.  . Carotid artery disease ([place])   . Chronic anxiety   . Diverticulosis   . Esophagitis   . GERD (gastroesophageal reflux disease)   . History of colon polyps   . History of echocardiogram    a. 08/2013: normal LVSF, nl RVSF, no valvular regurgitation or stenosis   . Hyperlipidemia   . Hypertension   . Hypothyroidism   . Mild reactive airways disease   . Osteoarthritis   . PONV (postoperative nausea and vomiting)   . Rheumatoid arthritis ([place])   . Stroke [facility])    a. 05/2014 pt c/o garbled speech following cath 5/19. MRI/A 5/26 showed left caudate infarct->conservative mgmt per neuro.  . Thyroid cancer ([place])    a. s/p right sided hemi-thyroidectomy; b. planning for radioactive iodine tx; c. followed by Dr. Marcelene Butte  . Type 2 diabetes mellitus [facility])     Patient Active Problem List   Diagnosis Date Noted  . Acute bilateral low back pain without sciatica 03/17/2016  . Idiopathic chronic gout of  multiple sites without tophus 03/17/2016  . Idiopathic gout of multiple sites 11/27/2015  . Cellulitis 05/15/2015  . Bilateral leg edema 02/08/2015  . Chronic anxiety 11/22/2014  . Cerebrovascular accident (CVA) ([place]) 07/25/2014  . Arterial vascular disease 07/25/2014  . Swelling of right lower extremity 06/22/2014  . Type 2 diabetes mellitus ([place])   . Stroke ([place]) 06/08/2014  . Anemia 06/07/2014  . Unstable angina ([place]) 06/06/2014  . Carotid artery disease ([place])   . CAD (coronary artery disease)   . Hyperlipidemia   . Hypertension   . Angina pectoris ([place]) 05/31/2014  . SOB (shortness of breath) 04/11/2014  . Palpitations 04/11/2014  . Other fatigue 04/11/2014  . Thyroid cancer ([place]) 04/11/2014    Past Surgical History:  Procedure Laterality Date  . ABDOMINAL HYSTERECTOMY    . APPENDECTOMY    . AUGMENTATION MAMMAPLASTY    . BACK SURGERY    . BREAST IMPLANT REMOVAL  06/2001  . CARDIAC CATHETERIZATION N/A 06/06/2014   Procedure: Coronary/Graft Atherectomy;  Surgeon: Wellington Hampshire, MD;  Location: [place] [facility];  Service: Cardiovascular;  Laterality: N/A;  . CARDIAC CATHETERIZATION N/A 05/31/2014   Procedure: Left Heart Cath;  Surgeon: Minna Merritts, MD;  Location: [place] [facility];  Service: Cardiovascular;  Laterality: N/A;  . CARDIAC CATHETERIZATION N/A 05/31/2014   Procedure: Coronary Stent Intervention;  Surgeon: Wellington Hampshire,  MD;  Location: [place] [facility];  Service: Cardiovascular;  Laterality: N/A;  . CARPAL TUNNEL RELEASE     "I've have a total of 7" (06/06/2014)  . CATARACT EXTRACTION W/ INTRAOCULAR LENS  IMPLANT, BILATERAL Bilateral   . CHOLECYSTECTOMY    . CORONARY ANGIOPLASTY WITH STENT PLACEMENT  05/31/2014; 06/06/2014   "2; 1"  . GANGLION CYST EXCISION Right    AC joint  . LUMBAR LAMINECTOMY  X 6  . MENISCUS REPAIR Bilateral    "2 on the right; 1 on the left"  . ROTATOR CUFF REPAIR     "I think 2 left, 2 right"  . THYROIDECTOMY    .  TONSILLECTOMY    . TUBAL LIGATION      Prior to Admission medications   Medication Sig Start Date End Date Taking? Authorizing Provider  allopurinol (ZYLOPRIM) 100 MG tablet Take 1 tablet (100 mg total) by mouth 2 (two) times daily. 03/17/16   Newt Minion, MD  ALPRAZolam Duanne Moron) 0.25 MG tablet Take 0.25 mg by mouth at bedtime as needed for anxiety or sleep.  08/23/13   [provider]  aspirin EC 81 MG tablet Take 81 mg by mouth daily.     [provider]  cephALEXin (KEFLEX) 500 MG capsule Take 1 capsule (500 mg total) by mouth 3 (three) times daily. 10/23/16 11/02/16  Lannie Fields, PA-C  Cholecalciferol 1000 UNITS tablet Take 1,000 Units by mouth daily.     [provider]  clopidogrel (PLAVIX) 75 MG tablet TAKE 1 TABLET DAILY 01/31/16   Minna Merritts, MD  Colchicine (MITIGARE) 0.6 MG CAPS Take 0.6 mg by mouth 2 (two) times daily. 11/27/15   Suzan Slick, NP  fluconazole (DIFLUCAN) 100 MG tablet Take 1 tablet (100 mg total) by mouth daily. 10/19/16   Cammie Sickle, MD  levothyroxine (SYNTHROID, LEVOTHROID) 88 MCG tablet Take 1 tablet (88 mcg total) by mouth daily. 10/19/16   Cammie Sickle, MD  metoprolol succinate (TOPROL XL) 50 MG 24 hr tablet Take 1 tablet (50 mg total) by mouth daily. 05/13/15   Minna Merritts, MD  nitroGLYCERIN (NITROSTAT) 0.4 MG SL tablet PLACE 1 TABLET UNDER THE TONGUE EVERY 5 MINUTES AS NEEDED FOR CHEST PAIN Patient not taking: Reported on 10/19/2016 10/01/15   Minna Merritts, MD  pantoprazole (PROTONIX) 40 MG tablet TAKE 1 TABLET(40 MG) BY MOUTH DAILY 09/21/16   Minna Merritts, MD  ramipril (ALTACE) 10 MG capsule TAKE 1 CAPSULE(10 MG) BY MOUTH DAILY 05/13/15   Minna Merritts, MD  Turmeric 500 MG CAPS Take 500 mg by mouth daily.    [provider]    Allergies Ciprofloxacin; Codeine; Metronidazole; Sulfa antibiotics; Neomycin; Neomycin-bacitracin zn-polymyx; Bacitracin-polymyxin b; Celecoxib;  Petrolatum-zinc oxide; Statins; and Vioxx [rofecoxib]  Family History  Problem Relation Age of Onset  . Heart disease Sister        stent placed x 3     Social History Social History  Substance Use Topics  . Smoking status: Never Smoker  . Smokeless tobacco: Never Used  . Alcohol use No    Review of Systems  Constitutional: No fever/chills Eyes: No visual changes. ENT: No sore throat. Respiratory: Denies cough Genitourinary: Negative for dysuria. Musculoskeletal: Negative for back pain. Skin: Negative for rash. positive skin tear    ____________________________________________   PHYSICAL EXAM:  VITAL SIGNS: ED Triage Vitals  Enc Vitals Group     BP 10/25/16 1231 (!) 128/47  Pulse Rate 10/25/16 1231 67     Resp 10/25/16 1231 18     Temp 10/25/16 1231 98.1 F (36.7 C)     Temp Source 10/25/16 1231 Oral     SpO2 --      Weight 10/25/16 1228 140 lb (63.5 kg)     Height 10/25/16 1228 5\' 3"  (1.6 m)     Head Circumference --      Peak Flow --      Pain Score 10/25/16 1227 0     Pain Loc --      Pain Edu? --      Excl. in [place]? --     Constitutional: Alert and oriented. Well appearing and in no acute distress. Eyes: Conjunctivae are normal.  Head: Atraumatic. Nose: No congestion/rhinnorhea. Mouth/Throat: Mucous membranes are moist.   Cardiovascular: Normal rate, regular rhythm. Respiratory: Normal respiratory effort.  No retractions GU: deferred Musculoskeletal: FROM all extremities, warm and well perfused Neurologic:  Normal speech and language.  Skin:  Skin is warm, dry. BAndage was  removed. Blood is clotted along the top of the skin tear. There is no active bleeding at this time. There is no redness or pus noted Psychiatric: Mood and affect are normal. Speech and behavior are normal.  ____________________________________________   LABS (all labs ordered are listed, but only abnormal results are displayed)  Labs Reviewed - No data to  display ____________________________________________   ____________________________________________  RADIOLOGY    ____________________________________________   PROCEDURES  Procedure(s) performed: bandage was reapplied with Vaseline gauze, 4 x 4 gauze      ____________________________________________   INITIAL IMPRESSION / ASSESSMENT AND PLAN / ED COURSE  Pertinent labs & imaging results that were available during my care of the patient were reviewed by me and considered in my medical decision making (see chart for details).  Patient is a well 84 year old female with a skin tear that was repaired emergency department. Skin tear does not look infected at this time. We reapplied a dressing. She was able to ambulate on discharge.      ____________________________________________   FINAL CLINICAL IMPRESSION(S) / ED DIAGNOSES  Final diagnoses:  Encounter for wound care  Noninfected skin tear of right lower extremity, initial encounter      NEW MEDICATIONS STARTED DURING THIS VISIT:  Discharge Medication List as of 10/25/2016  2:17 PM       Note:  This document was prepared using Dragon voice recognition software and may include unintentional dictation errors.    Versie Starks, PA-C 10/25/16 1607    Lavonia Drafts, MD 10/26/16 [phone]

## 2016-11-25 ENCOUNTER — Other Ambulatory Visit: Payer: Self-pay | Admitting: Cardiovascular Disease

## 2016-11-27 ENCOUNTER — Other Ambulatory Visit: Payer: Self-pay | Admitting: Internal Medicine

## 2016-11-27 DIAGNOSIS — Z1231 Encounter for screening mammogram for malignant neoplasm of breast: Secondary | ICD-10-CM

## 2016-12-15 ENCOUNTER — Other Ambulatory Visit: Payer: Self-pay | Admitting: Cardiovascular Disease

## 2016-12-22 ENCOUNTER — Ambulatory Visit (INDEPENDENT_AMBULATORY_CARE_PROVIDER_SITE_OTHER): Payer: Medicare Other | Admitting: Orthopedic Surgery

## 2016-12-23 ENCOUNTER — Other Ambulatory Visit: Payer: Self-pay | Admitting: Cardiovascular Disease

## 2016-12-28 ENCOUNTER — Ambulatory Visit: Payer: Medicare Other

## 2016-12-29 ENCOUNTER — Encounter (INDEPENDENT_AMBULATORY_CARE_PROVIDER_SITE_OTHER): Payer: Self-pay | Admitting: Orthopedic Surgery

## 2016-12-29 ENCOUNTER — Ambulatory Visit (INDEPENDENT_AMBULATORY_CARE_PROVIDER_SITE_OTHER): Payer: Medicare Other | Admitting: Radiology

## 2016-12-29 DIAGNOSIS — M1A09X0 Idiopathic chronic gout, multiple sites, without tophus (tophi): Secondary | ICD-10-CM

## 2016-12-29 MED ORDER — ALLOPURINOL 100 MG PO TABS: 100.0000 mg | ORAL_TABLET | Freq: Two times a day (BID) | ORAL | 10 refills | Status: DC

## 2016-12-29 MED ORDER — COLCHICINE 0.6 MG PO CAPS
0.6000 mg | ORAL_CAPSULE | Freq: Two times a day (BID) | ORAL | 2 refills | Status: DC
Start: 2016-12-29 — End: 2017-07-01

## 2016-12-29 NOTE — Progress Notes (Signed)
Patient seen for nurse only appointment for uric acid blood draw and reorder of gout medications.

## 2016-12-30 ENCOUNTER — Telehealth (INDEPENDENT_AMBULATORY_CARE_PROVIDER_SITE_OTHER): Payer: Self-pay | Admitting: Orthopedic Surgery

## 2016-12-30 LAB — URIC ACID: Uric Acid, Serum: 3.9 mg/dL (ref 2.5–7.0)

## 2016-12-30 MED ORDER — COLCHICINE 0.6 MG PO CAPS: ORAL_CAPSULE | ORAL | 2 refills | Status: DC

## 2016-12-30 NOTE — Telephone Encounter (Signed)
Patient called for lab results. Wants someone to call her back

## 2016-12-30 NOTE — Telephone Encounter (Signed)
I called patient again, already tried calling this morning with no answer and no voicemail. Advised her that uric acid 3.9 within normal limits, continue gout medications. All prescriptions refilled yesterday. Another rx for mitigare which are colchicine capsules sent to her pharmacy which is preferred. Left this in pharmacy notes as well.

## 2017-01-01 ENCOUNTER — Encounter (INDEPENDENT_AMBULATORY_CARE_PROVIDER_SITE_OTHER): Payer: Self-pay | Admitting: Radiology

## 2017-01-13 ENCOUNTER — Other Ambulatory Visit: Payer: Self-pay | Admitting: Cardiovascular Disease

## 2017-01-18 ENCOUNTER — Other Ambulatory Visit: Payer: Self-pay | Admitting: Internal Medicine

## 2017-01-18 DIAGNOSIS — R1310 Dysphagia, unspecified: Secondary | ICD-10-CM

## 2017-01-27 ENCOUNTER — Ambulatory Visit
Admission: RE | Admit: 2017-01-27 | Discharge: 2017-01-27 | Disposition: A | Discharge status: Home / Self Care | Payer: Medicare Other | Source: Ambulatory Visit | Attending: Internal Medicine | Admitting: Internal Medicine

## 2017-01-27 DIAGNOSIS — Z1231 Encounter for screening mammogram for malignant neoplasm of breast: Secondary | ICD-10-CM

## 2017-02-05 ENCOUNTER — Ambulatory Visit: Payer: Medicare Other

## 2017-02-21 ENCOUNTER — Other Ambulatory Visit: Payer: Self-pay | Admitting: Cardiovascular Disease

## 2017-02-23 ENCOUNTER — Telehealth: Payer: Self-pay | Admitting: Cardiovascular Disease

## 2017-02-23 NOTE — Telephone Encounter (Signed)
-----   Message from Janan Ridge, [place] sent at 02/22/2017  9:14 AM EST ----- Can you please try to make an appointment with Dr. Rockey Situ, Thank you!

## 2017-02-23 NOTE — Telephone Encounter (Signed)
lmov to schedule fu  °

## 2017-02-24 ENCOUNTER — Other Ambulatory Visit: Payer: Self-pay | Admitting: Cardiovascular Disease

## 2017-02-28 ENCOUNTER — Other Ambulatory Visit: Payer: Self-pay | Admitting: Cardiovascular Disease

## 2017-03-01 NOTE — Telephone Encounter (Signed)
Pt scheduled for 03/15/17

## 2017-03-09 ENCOUNTER — Emergency Department: Payer: Medicare Other

## 2017-03-09 ENCOUNTER — Encounter: Payer: Self-pay | Admitting: Emergency Medicine

## 2017-03-09 ENCOUNTER — Other Ambulatory Visit: Payer: Self-pay

## 2017-03-09 ENCOUNTER — Emergency Department
Admission: EM | Admit: 2017-03-09 | Discharge: 2017-03-09 | Disposition: A | Discharge status: Home / Self Care | Payer: Medicare Other | Attending: Emergency Medicine | Admitting: Emergency Medicine

## 2017-03-09 DIAGNOSIS — S0990XA Unspecified injury of head, initial encounter: Secondary | ICD-10-CM | POA: Diagnosis present

## 2017-03-09 DIAGNOSIS — S8002XA Contusion of left knee, initial encounter: Secondary | ICD-10-CM | POA: Insufficient documentation

## 2017-03-09 DIAGNOSIS — Z7982 Long term (current) use of aspirin: Secondary | ICD-10-CM | POA: Insufficient documentation

## 2017-03-09 DIAGNOSIS — Z7902 Long term (current) use of antithrombotics/antiplatelets: Secondary | ICD-10-CM | POA: Insufficient documentation

## 2017-03-09 DIAGNOSIS — I2510 Atherosclerotic heart disease of native coronary artery without angina pectoris: Secondary | ICD-10-CM | POA: Diagnosis not present

## 2017-03-09 DIAGNOSIS — W01198A Fall on same level from slipping, tripping and stumbling with subsequent striking against other object, initial encounter: Secondary | ICD-10-CM | POA: Insufficient documentation

## 2017-03-09 DIAGNOSIS — E039 Hypothyroidism, unspecified: Secondary | ICD-10-CM | POA: Insufficient documentation

## 2017-03-09 DIAGNOSIS — E119 Type 2 diabetes mellitus without complications: Secondary | ICD-10-CM | POA: Diagnosis not present

## 2017-03-09 DIAGNOSIS — S8000XA Contusion of unspecified knee, initial encounter: Secondary | ICD-10-CM

## 2017-03-09 DIAGNOSIS — Y999 Unspecified external cause status: Secondary | ICD-10-CM | POA: Insufficient documentation

## 2017-03-09 DIAGNOSIS — Y929 Unspecified place or not applicable: Secondary | ICD-10-CM | POA: Insufficient documentation

## 2017-03-09 DIAGNOSIS — Z955 Presence of coronary angioplasty implant and graft: Secondary | ICD-10-CM | POA: Insufficient documentation

## 2017-03-09 DIAGNOSIS — S0083XA Contusion of other part of head, initial encounter: Secondary | ICD-10-CM | POA: Diagnosis not present

## 2017-03-09 DIAGNOSIS — S8001XA Contusion of right knee, initial encounter: Secondary | ICD-10-CM | POA: Insufficient documentation

## 2017-03-09 DIAGNOSIS — I10 Essential (primary) hypertension: Secondary | ICD-10-CM | POA: Insufficient documentation

## 2017-03-09 DIAGNOSIS — Y9389 Activity, other specified: Secondary | ICD-10-CM | POA: Insufficient documentation

## 2017-03-09 MED ORDER — BACITRACIN ZINC 500 UNIT/GM EX OINT
TOPICAL_OINTMENT | CUTANEOUS | Status: AC
  Administered 2017-03-09: 02:00:00
  Filled 2017-03-09: qty 0.9

## 2017-03-09 NOTE — ED Triage Notes (Signed)
Pt presents to ED with c/o head injury, knee pain, and hand pain after falling. Pt states she tripped and fell onto concrete. Large hematoma with laceration noted to left side of forehead. Bleeding controlled. Abrasion noted to her left hand as well. Pt denies loc.

## 2017-03-09 NOTE — ED Notes (Signed)
Family at bedside. 

## 2017-03-09 NOTE — ED Notes (Signed)
Bacitracin and dressings applied to the left hand and the left forehead. Non stick dressing applied as well.

## 2017-03-09 NOTE — ED Notes (Signed)
Patient transported to CT 

## 2017-03-09 NOTE — ED Provider Notes (Signed)
[facility] Emergency Department Provider Note ___   First MD Initiated Contact with Patient 03/09/17 [phone]     (approximate)  I have reviewed the triage vital signs and the nursing notes.   HISTORY  Chief Complaint Fall and Head Injury    HPI [name] PETTAWAY is a 84 y.o. female with below list of chronic medical conditions presents to the emergency department following accidental trip and fall onto concrete.  Patient states that she tripped after exiting her car resulted into a fall with left forehead left hand and bilateral knee injury.  Patient states her current pain score is 4 out of 10 on arrival.  Patient denies any loss of consciousness.   Past Medical History:  Diagnosis Date  . CAD (coronary artery disease)    a. lexiscan 08/2013: nl wall motion, no ischemia, EF 50-55%; b. cardiac cath 05/31/2014: mLAD 90%, dLAD 50%, dLCx 60%, 1st Mrg 80%, pRCA 60% s/p PCI/DES, mRCA 1st lesion 70%, mRCA 2nd 95% s/p PCI/long DES to cover entire mRCA, RPLB 40%;  c. 05/2014 Staged PCI of LAD w/ 2.5 x 15 mm Xience [place] DES.  . Carotid artery disease ([place])   . Chronic anxiety   . Diverticulosis   . Esophagitis   . GERD (gastroesophageal reflux disease)   . History of colon polyps   . History of echocardiogram    a. 08/2013: normal LVSF, nl RVSF, no valvular regurgitation or stenosis   . Hyperlipidemia   . Hypertension   . Hypothyroidism   . Mild reactive airways disease   . Osteoarthritis   . PONV (postoperative nausea and vomiting)   . Rheumatoid arthritis ([place])   . Stroke [facility])    a. 05/2014 pt c/o garbled speech following cath 5/19. MRI/A 5/26 showed left caudate infarct->conservative mgmt per neuro.  . Thyroid cancer ([place])    a. s/p right sided hemi-thyroidectomy; b. planning for radioactive iodine tx; c. followed by Dr. Marcelene Butte  . Type 2 diabetes mellitus [facility])     Patient Active Problem List   Diagnosis Date Noted  . Acute bilateral low back pain  without sciatica 03/17/2016  . Idiopathic chronic gout of multiple sites without tophus 03/17/2016  . Idiopathic gout of multiple sites 11/27/2015  . Cellulitis 05/15/2015  . Bilateral leg edema 02/08/2015  . Chronic anxiety 11/22/2014  . Cerebrovascular accident (CVA) ([place]) 07/25/2014  . Arterial vascular disease 07/25/2014  . Swelling of right lower extremity 06/22/2014  . Type 2 diabetes mellitus ([place])   . Stroke ([place]) 06/08/2014  . Anemia 06/07/2014  . Unstable angina ([place]) 06/06/2014  . Carotid artery disease ([place])   . CAD (coronary artery disease)   . Hyperlipidemia   . Hypertension   . Angina pectoris ([place]) 05/31/2014  . SOB (shortness of breath) 04/11/2014  . Palpitations 04/11/2014  . Other fatigue 04/11/2014  . Thyroid cancer ([place]) 04/11/2014    Past Surgical History:  Procedure Laterality Date  . ABDOMINAL HYSTERECTOMY    . APPENDECTOMY    . AUGMENTATION MAMMAPLASTY    . BACK SURGERY    . BREAST IMPLANT REMOVAL  06/2001  . CARDIAC CATHETERIZATION N/A 06/06/2014   Procedure: Coronary/Graft Atherectomy;  Surgeon: Wellington Hampshire, MD;  Location: [place] [facility];  Service: Cardiovascular;  Laterality: N/A;  . CARDIAC CATHETERIZATION N/A 05/31/2014   Procedure: Left Heart Cath;  Surgeon: Minna Merritts, MD;  Location: [place] [facility];  Service: Cardiovascular;  Laterality: N/A;  . CARDIAC CATHETERIZATION N/A  05/31/2014   Procedure: Coronary Stent Intervention;  Surgeon: Wellington Hampshire, MD;  Location: [place] [facility];  Service: Cardiovascular;  Laterality: N/A;  . CARPAL TUNNEL RELEASE     "I've have a total of 7" (06/06/2014)  . CATARACT EXTRACTION W/ INTRAOCULAR LENS  IMPLANT, BILATERAL Bilateral   . CHOLECYSTECTOMY    . CORONARY ANGIOPLASTY WITH STENT PLACEMENT  05/31/2014; 06/06/2014   "2; 1"  . GANGLION CYST EXCISION Right    AC joint  . LUMBAR LAMINECTOMY  X 6  . MENISCUS REPAIR Bilateral    "2 on the right; 1 on the left"  . ROTATOR CUFF REPAIR      "I think 2 left, 2 right"  . THYROIDECTOMY    . TONSILLECTOMY    . TUBAL LIGATION      Prior to Admission medications   Medication Sig Start Date End Date Taking? Authorizing Provider  allopurinol (ZYLOPRIM) 100 MG tablet Take 1 tablet (100 mg total) by mouth 2 (two) times daily. 12/29/16   Newt Minion, MD  ALPRAZolam Duanne Moron) 0.25 MG tablet Take 0.25 mg by mouth at bedtime as needed for anxiety or sleep.  08/23/13   [provider]  aspirin EC 81 MG tablet Take 81 mg by mouth daily.     [provider]  Cholecalciferol 1000 UNITS tablet Take 1,000 Units by mouth daily.     [provider]  clopidogrel (PLAVIX) 75 MG tablet TAKE 1 TABLET DAILY 01/31/16   Minna Merritts, MD  Colchicine (MITIGARE) 0.6 MG CAPS Take 0.6 mg by mouth 2 (two) times daily. 12/29/16   Newt Minion, MD  Colchicine (MITIGARE) 0.6 MG CAPS Take one capsule by mouth twice daily. 12/30/16   Newt Minion, MD  fluconazole (DIFLUCAN) 100 MG tablet Take 1 tablet (100 mg total) by mouth daily. 10/19/16   Cammie Sickle, MD  levothyroxine (SYNTHROID, LEVOTHROID) 88 MCG tablet Take 1 tablet (88 mcg total) by mouth daily. 10/19/16   Cammie Sickle, MD  metoprolol succinate (TOPROL XL) 50 MG 24 hr tablet Take 1 tablet (50 mg total) by mouth daily. 05/13/15   Minna Merritts, MD  nitroGLYCERIN (NITROSTAT) 0.4 MG SL tablet PLACE 1 TABLET UNDER THE TONGUE EVERY 5 MINUTES AS NEEDED FOR CHEST PAIN 10/01/15   Minna Merritts, MD  nitroGLYCERIN (NITROSTAT) 0.4 MG SL tablet PLACE 1 TABLET UNDER THE TONGUE EVERY 5 MINUTES AS NEEDED FOR CHEST PAIN 03/01/17   Minna Merritts, MD  pantoprazole (PROTONIX) 40 MG tablet TAKE 1 TABLET BY MOUTH DAILY 02/26/17   Minna Merritts, MD  ramipril (ALTACE) 10 MG capsule TAKE 1 CAPSULE(10 MG) BY MOUTH DAILY 05/13/15   Minna Merritts, MD  Turmeric 500 MG CAPS Take 500 mg by mouth daily.    [provider]    Allergies Ciprofloxacin; Codeine;  Metronidazole; Sulfa antibiotics; Neomycin; Neomycin-bacitracin zn-polymyx; Bacitracin-polymyxin b; Celecoxib; Petrolatum-zinc oxide; Statins; and Vioxx [rofecoxib]  Family History  Problem Relation Age of Onset  . Heart disease Sister        stent placed x 3     Social History Social History   Tobacco Use  . Smoking status: Never Smoker  . Smokeless tobacco: Never Used  Substance Use Topics  . Alcohol use: No  . Drug use: No    Review of Systems Constitutional: No fever/chills Eyes: No visual changes. ENT: No sore throat. Cardiovascular: Denies chest pain. Respiratory: Denies shortness of breath. Gastrointestinal: No abdominal  pain.  No nausea, no vomiting.  No diarrhea.  No constipation. Genitourinary: Negative for dysuria. Musculoskeletal: Positive for bilateral knee pain swelling.  Positive for left hand pain swelling Integumentary: Positive for left hand abrasion.  Positive for left forehead abrasion Neurological: Negative for headaches, focal weakness or numbness.   ____________________________________________   PHYSICAL EXAM:  VITAL SIGNS: ED Triage Vitals  Enc Vitals Group     BP 03/09/17 0027 (!) 169/47     Pulse Rate 03/09/17 0027 (!) 107     Resp 03/09/17 0027 18     Temp 03/09/17 0027 97.6 F (36.4 C)     Temp Source 03/09/17 0027 Oral     SpO2 03/09/17 0027 95 %     Weight 03/09/17 0028 55.3 kg (122 lb)     Height 03/09/17 0028 1.575 m (5\' 2" )     Head Circumference --      Peak Flow --      Pain Score 03/09/17 0027 5     Pain Loc --      Pain Edu? --    Constitutional: Alert and oriented. Well appearing and in no acute distress. Eyes: Conjunctivae are normal.  Head: Left forehead abrasion with associated swelling and ecchymosis Nose: No congestion/rhinnorhea. Mouth/Throat: Mucous membranes are moist.  Oropharynx non-erythematous. Neck: No stridor.  No cervical spine tenderness to palpation. Cardiovascular: Normal rate, regular rhythm. Good  peripheral circulation. Grossly normal heart sounds. Respiratory: Normal respiratory effort.  No retractions. Lungs CTAB. Gastrointestinal: Soft and nontender. No distention.  Musculoskeletal: Contusion/ecchymoses and swelling noted by the.  Abrasion and swelling noted to the left thumb MCP joint Ities. Neurologic:  Normal speech and language. No gross focal neurologic deficits are appreciated.  Skin: Contusion/ecchymoses and swelling.  Abrasion noted left forehead with associated swelling.  Abrasion noted left thumb MCP joint Psychiatric: Mood and affect are normal. Speech and behavior are normal.  ______  RADIOLOGY I, [place] N BROWN, personally viewed and evaluated these images (plain radiographs) as part of my medical decision making, as well as reviewing the written report by the radiologist.    Official radiology report(s): Ct Head Wo Contrast  Result Date: 03/09/2017 CLINICAL DATA:  Fall EXAM: CT HEAD WITHOUT CONTRAST TECHNIQUE: Contiguous axial images were obtained from the base of the skull through the vertex without intravenous contrast. COMPARISON:  06/09/2015 FINDINGS: Brain: There is atrophy and chronic small vessel disease changes. No acute intracranial abnormality. Specifically, no hemorrhage, hydrocephalus, mass lesion, acute infarction, or significant intracranial injury. Vascular: No hyperdense vessel or unexpected calcification. Skull: No acute calvarial abnormality. Sinuses/Orbits: Mucosal thickening in the paranasal sinuses. Other: Soft tissue swelling over the left forehead. IMPRESSION: No acute intracranial abnormality. Atrophy, chronic microvascular disease. Electronically Signed   By: Rolm Baptise M.D.   On: 03/09/2017 01:35   Dg Knee Complete 4 Views Left  Result Date: 03/09/2017 CLINICAL DATA:  Fall, bilateral knee pain EXAM: LEFT KNEE - COMPLETE 4+ VIEW COMPARISON:  06/09/2015 FINDINGS: Mild degenerative changes in the medial and patellofemoral compartments with  joint space narrowing and early spurring. No acute bony abnormality. Specifically, no fracture, subluxation, or dislocation. No joint effusion. IMPRESSION: Mild degenerative changes.  No acute bony abnormality. Electronically Signed   By: Rolm Baptise M.D.   On: 03/09/2017 01:32   Dg Knee Complete 4 Views Right  Result Date: 03/09/2017 CLINICAL DATA:  Fall, bilateral knee pain EXAM: RIGHT KNEE - COMPLETE 4+ VIEW COMPARISON:  06/09/2015 FINDINGS: Advanced tricompartment degenerative changes with joint space  loss and spurring. No acute bony abnormality. Specifically, no fracture, subluxation, or dislocation. Small joint effusion. IMPRESSION: Advanced tricompartment degenerative changes with small joint effusion. No acute bony abnormality. Electronically Signed   By: Rolm Baptise M.D.   On: 03/09/2017 01:33   Dg Hand Complete Left  Result Date: 03/09/2017 CLINICAL DATA:  Fall EXAM: LEFT HAND - COMPLETE 3+ VIEW COMPARISON:  None. FINDINGS: Diffuse advanced degenerative changes throughout the IP joints and 1st carpometacarpal joint. Most severe changes in the 3rd and 4th DIP joints and the 1st carpometacarpal joint. Chondrocalcinosis in the triangular fibrocartilage. No acute bony abnormality. Specifically, no fracture, subluxation, or dislocation. IMPRESSION: Advanced osteoarthritic changes within the left hand. No acute bony abnormality. Electronically Signed   By: Rolm Baptise M.D.   On: 03/09/2017 01:34    _______________________  Procedures   ____________________________________________   INITIAL IMPRESSION / ASSESSMENT AND PLAN / ED COURSE  As part of my medical decision making, I reviewed the following data within the electronic MEDICAL RECORD NUMBER  84 year old female with the above-stated history and physical exam secondary to accidental fall.  Concern for possible intracranial vascular injury versus skull fracture given forehead injury as such CT scan of the head was performed which revealed  no acute intracranial findings.  Concern for possible tibial plateau versus other acute bony abnormality of the bilateral knees and as such x-rays were performed which revealed no evidence of fracture or dislocation marcated arthritic changes noted.  Consider the possibility of left thumb fracture or dislocation given injury in that location.  X-ray revealed no evidence of any acute findings again marketed arthritic changes noted.  Patient informed of all clinical findings ice pack applied to each injury antibiotic ointment applied to each abrasion. ____________________________________________  FINAL CLINICAL IMPRESSION(S) / ED DIAGNOSES  Final diagnoses:  Contusion of face, initial encounter  Contusion of knee, unspecified laterality, initial encounter     MEDICATIONS GIVEN DURING THIS VISIT:  Medications  bacitracin 500 UNIT/GM ointment (  Given 03/09/17 0215)     ED Discharge Orders    None       Note:  This document was prepared using Dragon voice recognition software and may include unintentional dictation errors.    Gregor Hams, MD 03/09/17 2237

## 2017-03-09 NOTE — ED Notes (Signed)
Pt to the er for wounds r/t a fall while getting out of the car in the handicapped lane. Pt tripped over broken pavement. No LOC. Pt is taking plavix. Pt is alert and oriented.

## 2017-03-13 NOTE — Progress Notes (Signed)
Cardiology Office Note  Date:  03/15/2017   ID:  [name], [name] [birth year], MRN [id number]  PCP:  [name] Crouch, MD   Chief Complaint  Patient presents with  . OTHER    12 month f/u Pt had fall due to tripping over in parking lot and discuss blood thinners for possible tooth extraction. Meds reviewed verbally with pt.    HPI:  [name] is a 84 y/o female with PMH of  coronary artery disease,  cath May 2016 revealing severe LAD and RCA disease,  Percutaneous intervention and drug-eluting stent placement was performed to  the right coronary artery.The LAD was also felt to be amenable for PCI but it was felt that she would require a Rotablator. This was performed later at [facility] Jun 01, 2014 She presents today for routine follow-up for coronary artery disease  Tripped on curb, hit left side of face, Lay on the ground 30 min Went to the hospital had CT scan head Showed no acute findings, atrophy, chronic microvascular disease Balance is poor walks with a cane  Previously fell in the bedroom emts came  No vertigo, dizziness No chest pain, angina  Lab work reviewed and her  in detail Total chol 160 LDL 80 HBA1C 5.9  Denies any tachycardia or palpitations concerning for arrhythmia She eats out every day,  drinks significant amount of water. She watches her weight daily and takes Lasix for any increase in her weight No regular exercise,  Mild chronic shortness of breath on exertion  ShowsEKG personally reviewed by myself on todays visit Normal sinus rhythm rate 74 bpm no significant ST or T wave changes  Other past medical history reviewed Prior falls x 3 Mechanical, tripped on a mat, often stepping in the wrong place Very deconditioned, terrible gait instability, walks with a cane  Previous problems with gout, uric acid 8.1 On meloxican prn for pain  episode of garbled speech after PCI and MRI/A of the brain was performed and showed an acute,  small, nonhemorrhagic infarct of the left caudate with moderate small vessel disease. She did not have any acute neurologic deficits.  Carotid ultrasound was performed and showed bilateral 1-39% internal carotid artery stenosis.   Thyroid nodule found, status post ablation, currently on thyroid supplementation This was performed March 2016  PMH:   has a past medical history of CAD (coronary artery disease), Carotid artery disease ([place]), Chronic anxiety, Diverticulosis, Esophagitis, GERD (gastroesophageal reflux disease), History of colon polyps, History of echocardiogram, Hyperlipidemia, Hypertension, Hypothyroidism, Mild reactive airways disease, Osteoarthritis, PONV (postoperative nausea and vomiting), Rheumatoid arthritis ([place]), Stroke ([place]), Thyroid cancer ([place]), and Type 2 diabetes mellitus ([place]).  PSH:    Past Surgical History:  Procedure Laterality Date  . ABDOMINAL HYSTERECTOMY    . APPENDECTOMY    . AUGMENTATION MAMMAPLASTY    . BACK SURGERY    . BREAST IMPLANT REMOVAL  06/2001  . CARDIAC CATHETERIZATION N/A 06/06/2014   Procedure: Coronary/Graft Atherectomy;  Surgeon: Wellington Hampshire, MD;  Location: [place] [facility];  Service: Cardiovascular;  Laterality: N/A;  . CARDIAC CATHETERIZATION N/A 05/31/2014   Procedure: Left Heart Cath;  Surgeon: Minna Merritts, MD;  Location: [place] [facility];  Service: Cardiovascular;  Laterality: N/A;  . CARDIAC CATHETERIZATION N/A 05/31/2014   Procedure: Coronary Stent Intervention;  Surgeon: Wellington Hampshire, MD;  Location: [place] [facility];  Service: Cardiovascular;  Laterality: N/A;  . CARPAL TUNNEL RELEASE     "I've have  a total of 7" (06/06/2014)  . CATARACT EXTRACTION W/ INTRAOCULAR LENS  IMPLANT, BILATERAL Bilateral   . CHOLECYSTECTOMY    . CORONARY ANGIOPLASTY WITH STENT PLACEMENT  05/31/2014; 06/06/2014   "2; 1"  . GANGLION CYST EXCISION Right    AC joint  . LUMBAR LAMINECTOMY  X 6  . MENISCUS REPAIR Bilateral    "2 on the  right; 1 on the left"  . ROTATOR CUFF REPAIR     "I think 2 left, 2 right"  . THYROIDECTOMY    . TONSILLECTOMY    . TUBAL LIGATION      Current Outpatient Medications  Medication Sig Dispense Refill  . allopurinol (ZYLOPRIM) 100 MG tablet Take 1 tablet (100 mg total) by mouth 2 (two) times daily. 60 tablet 10  . ALPRAZolam (XANAX) 0.25 MG tablet Take 0.25 mg by mouth at bedtime as needed for anxiety or sleep.     . Cholecalciferol 1000 UNITS tablet Take 1,000 Units by mouth daily.     . clopidogrel (PLAVIX) 75 MG tablet TAKE 1 TABLET DAILY 90 tablet 3  . Colchicine (MITIGARE) 0.6 MG CAPS Take 0.6 mg by mouth 2 (two) times daily. 30 capsule 2  . levothyroxine (SYNTHROID, LEVOTHROID) 88 MCG tablet Take 1 tablet (88 mcg total) by mouth daily. 30 tablet 6  . metoprolol succinate (TOPROL XL) 50 MG 24 hr tablet Take 1 tablet (50 mg total) by mouth daily. 90 tablet 3  . Multiple Vitamins-Minerals (ICAPS AREDS 2 PO) Take by mouth daily.    . nitroGLYCERIN (NITROSTAT) 0.4 MG SL tablet PLACE 1 TABLET UNDER THE TONGUE EVERY 5 MINUTES AS NEEDED FOR CHEST PAIN 25 tablet 2  . pantoprazole (PROTONIX) 40 MG tablet TAKE 1 TABLET BY MOUTH DAILY 30 tablet 0  . ramipril (ALTACE) 10 MG capsule TAKE 1 CAPSULE(10 MG) BY MOUTH DAILY 30 capsule 3  . Thiamine HCl (VITAMIN B-1 PO) Take by mouth daily.    Marland Kitchen VITAMIN E PO Take by mouth daily.    . rosuvastatin (CRESTOR) 5 MG tablet Take 1 tablet (5 mg total) by mouth daily. 90 tablet 3   No current facility-administered medications for this visit.      Allergies:   Ciprofloxacin; Codeine; Metronidazole; Sulfa antibiotics; Neomycin; Neomycin-bacitracin zn-polymyx; Bacitracin-polymyxin b; Celecoxib; Petrolatum-zinc oxide; Statins; and Vioxx [rofecoxib]   Social History:  The patient  reports that  has never smoked. she has never used smokeless tobacco. She reports that she does not drink alcohol or use drugs.   Family History:   family history includes Heart  disease in her sister.    Review of Systems: Review of Systems  Constitutional: Negative.   Respiratory: Negative.   Cardiovascular: Negative.   Gastrointestinal: Negative.   Musculoskeletal: Positive for falls.  Neurological: Negative.   Psychiatric/Behavioral: Negative.   All other systems reviewed and are negative.    PHYSICAL EXAM: VS:  BP (!) 110/58 (BP Location: Left Arm, Patient Position: Sitting, Cuff Size: Normal)   Pulse 74   Ht 5\' 2"  (1.575 m)   Wt 127 lb 8 oz (57.8 kg)   BMI 23.32 kg/m  , BMI Body mass index is 23.32 kg/m. Constitutional:  oriented to person, place, and time. No distress.  HENT: Large region of ecchymosis left face into her neck Head: Normocephalic and atraumatic.  Eyes:  no discharge. No scleral icterus.  Neck: Normal range of motion. Neck supple. No JVD present.  Cardiovascular: Normal rate, regular rhythm, normal heart sounds and intact distal  pulses. Exam reveals no gallop and no friction rub. No edema No murmur heard. Pulmonary/Chest: Effort normal and breath sounds normal. No stridor. No respiratory distress.  no wheezes.  no rales.  no tenderness.  Abdominal: Soft.  no distension.  no tenderness.  Musculoskeletal: Normal range of motion.  no  tenderness or deformity.  Neurological:  normal muscle tone. Coordination normal. No atrophy Skin: Skin is warm and dry. No rash noted. not diaphoretic.  Psychiatric:  normal mood and affect. behavior is normal. Thought content normal.        Recent Labs: 10/12/2016: ALT 14; BUN 23; Creatinine, Ser 0.66; Hemoglobin 10.5; Platelets 179; Potassium 4.7; Sodium 141; TSH 4.520    Lipid Panel Lab Results  Component Value Date   CHOL 151 08/14/2014   HDL 42 08/14/2014   LDLCALC 67 08/14/2014   TRIG 209 (H) 08/14/2014      Wt Readings from Last 3 Encounters:  03/15/17 127 lb 8 oz (57.8 kg)  03/09/17 122 lb (55.3 kg)  10/25/16 140 lb (63.5 kg)       ASSESSMENT AND PLAN:  Vertigo Denies  having any significant symptoms  Near syncope No significant symptoms No further workup needed  Coronary artery disease involving native coronary artery of native heart without angina pectoris Currently with no symptoms of angina. No further workup at this time. Continue current medication regimen.  Stable  Mixed hyperlipidemia Cholesterol mildly above goal, we have sent a new prescription for Crestor 5 mg daily  Essential hypertension Blood pressure is well controlled on today's visit. No changes made to the medications. Stable  Falls Recently tripped on a curb, large bruising to left face evaluated in the emergency room  Walking with a cane on today's visit, difficulty getting down from the table  Type 2 diabetes mellitus with other circulatory complication, without long-term current use of insulin (HCC)  hemoglobin A1c well controlled Stable  Anemia chronic issue Monitored by primary care   Total encounter time more than 25 minutes  Greater than 50% was spent in counseling and coordination of care with the patient   Disposition:   F/U  12 months   Orders Placed This Encounter  Procedures  . EKG 12-Lead     Signed, Esmond Plants, M.D., Ph.D. 03/15/2017  [place], [place] \

## 2017-03-15 ENCOUNTER — Encounter: Payer: Self-pay | Admitting: Cardiovascular Disease

## 2017-03-15 ENCOUNTER — Ambulatory Visit (INDEPENDENT_AMBULATORY_CARE_PROVIDER_SITE_OTHER): Payer: Medicare Other | Admitting: Cardiovascular Disease

## 2017-03-15 VITALS — BP 110/58 | HR 74 | Ht 62.0 in | Wt 127.5 lb

## 2017-03-15 DIAGNOSIS — I10 Essential (primary) hypertension: Secondary | ICD-10-CM

## 2017-03-15 DIAGNOSIS — R600 Localized edema: Secondary | ICD-10-CM | POA: Diagnosis not present

## 2017-03-15 DIAGNOSIS — R55 Syncope and collapse: Secondary | ICD-10-CM | POA: Diagnosis not present

## 2017-03-15 DIAGNOSIS — I25118 Atherosclerotic heart disease of native coronary artery with other forms of angina pectoris: Secondary | ICD-10-CM

## 2017-03-15 DIAGNOSIS — E782 Mixed hyperlipidemia: Secondary | ICD-10-CM | POA: Diagnosis not present

## 2017-03-15 DIAGNOSIS — E1159 Type 2 diabetes mellitus with other circulatory complications: Secondary | ICD-10-CM

## 2017-03-15 MED ORDER — ROSUVASTATIN CALCIUM 5 MG PO TABS: 5.0000 mg | ORAL_TABLET | Freq: Every day | ORAL | 3 refills | Status: DC

## 2017-03-15 NOTE — Patient Instructions (Signed)

## 2017-03-24 ENCOUNTER — Other Ambulatory Visit: Payer: Self-pay | Admitting: Cardiovascular Disease

## 2017-03-24 DIAGNOSIS — I2510 Atherosclerotic heart disease of native coronary artery without angina pectoris: Secondary | ICD-10-CM

## 2017-03-25 ENCOUNTER — Other Ambulatory Visit: Payer: Self-pay | Admitting: Cardiovascular Disease

## 2017-04-08 ENCOUNTER — Other Ambulatory Visit: Payer: Self-pay | Admitting: Cardiovascular Disease

## 2017-04-14 ENCOUNTER — Inpatient Hospital Stay: Payer: Medicare Other | Attending: Internal Medicine

## 2017-04-14 DIAGNOSIS — I10 Essential (primary) hypertension: Secondary | ICD-10-CM | POA: Diagnosis not present

## 2017-04-14 DIAGNOSIS — Z79899 Other long term (current) drug therapy: Secondary | ICD-10-CM | POA: Diagnosis not present

## 2017-04-14 DIAGNOSIS — Z86010 Personal history of colonic polyps: Secondary | ICD-10-CM | POA: Diagnosis not present

## 2017-04-14 DIAGNOSIS — M069 Rheumatoid arthritis, unspecified: Secondary | ICD-10-CM | POA: Diagnosis not present

## 2017-04-14 DIAGNOSIS — E785 Hyperlipidemia, unspecified: Secondary | ICD-10-CM | POA: Insufficient documentation

## 2017-04-14 DIAGNOSIS — Z955 Presence of coronary angioplasty implant and graft: Secondary | ICD-10-CM | POA: Insufficient documentation

## 2017-04-14 DIAGNOSIS — K219 Gastro-esophageal reflux disease without esophagitis: Secondary | ICD-10-CM | POA: Diagnosis not present

## 2017-04-14 DIAGNOSIS — Z85850 Personal history of malignant neoplasm of thyroid: Secondary | ICD-10-CM | POA: Insufficient documentation

## 2017-04-14 DIAGNOSIS — I2510 Atherosclerotic heart disease of native coronary artery without angina pectoris: Secondary | ICD-10-CM | POA: Diagnosis not present

## 2017-04-14 DIAGNOSIS — Z8673 Personal history of transient ischemic attack (TIA), and cerebral infarction without residual deficits: Secondary | ICD-10-CM | POA: Diagnosis not present

## 2017-04-14 DIAGNOSIS — E890 Postprocedural hypothyroidism: Secondary | ICD-10-CM | POA: Insufficient documentation

## 2017-04-14 DIAGNOSIS — Z923 Personal history of irradiation: Secondary | ICD-10-CM | POA: Insufficient documentation

## 2017-04-14 DIAGNOSIS — Z9841 Cataract extraction status, right eye: Secondary | ICD-10-CM | POA: Diagnosis not present

## 2017-04-14 DIAGNOSIS — D649 Anemia, unspecified: Secondary | ICD-10-CM | POA: Diagnosis not present

## 2017-04-14 DIAGNOSIS — J45909 Unspecified asthma, uncomplicated: Secondary | ICD-10-CM | POA: Insufficient documentation

## 2017-04-14 DIAGNOSIS — C73 Malignant neoplasm of thyroid gland: Secondary | ICD-10-CM

## 2017-04-14 LAB — CBC WITH DIFFERENTIAL/PLATELET
BASOS PCT: 0 %
Basophils Absolute: 0 10*3/uL (ref 0–0.1)
EOS ABS: 0.1 10*3/uL (ref 0–0.7)
Eosinophils Relative: 1 %
HCT: 31.3 % — ABNORMAL LOW (ref 35.0–47.0)
HEMOGLOBIN: 10.5 g/dL — AB (ref 12.0–16.0)
Lymphocytes Relative: 12 %
Lymphs Abs: 0.9 10*3/uL — ABNORMAL LOW (ref 1.0–3.6)
MCH: 30.7 pg (ref 26.0–34.0)
MCHC: 33.4 g/dL (ref 32.0–36.0)
MCV: 91.8 fL (ref 80.0–100.0)
MONOS PCT: 6 %
Monocytes Absolute: 0.4 10*3/uL (ref 0.2–0.9)
NEUTROS PCT: 81 %
Neutro Abs: 5.9 10*3/uL (ref 1.4–6.5)
PLATELETS: 209 10*3/uL (ref 150–440)
RBC: 3.41 MIL/uL — ABNORMAL LOW (ref 3.80–5.20)
RDW: 15.6 % — ABNORMAL HIGH (ref 11.5–14.5)
WBC: 7.4 10*3/uL (ref 3.6–11.0)

## 2017-04-14 LAB — COMPREHENSIVE METABOLIC PANEL
ALT: 14 U/L (ref 14–54)
AST: 23 U/L (ref 15–41)
Albumin: 4 g/dL (ref 3.5–5.0)
Alkaline Phosphatase: 72 U/L (ref 38–126)
Anion gap: 9 (ref 5–15)
BUN: 32 mg/dL — ABNORMAL HIGH (ref 6–20)
CALCIUM: 9.4 mg/dL (ref 8.9–10.3)
CO2: 22 mmol/L (ref 22–32)
CREATININE: 0.91 mg/dL (ref 0.44–1.00)
Chloride: 108 mmol/L (ref 101–111)
GFR, EST NON AFRICAN AMERICAN: 58 mL/min — AB (ref >60)
Glucose, Bld: 93 mg/dL (ref 65–99)
Potassium: 4.6 mmol/L (ref 3.5–5.1)
Sodium: 139 mmol/L (ref 135–145)
Total Bilirubin: 0.6 mg/dL (ref 0.3–1.2)
Total Protein: 6.7 g/dL (ref 6.5–8.1)

## 2017-04-15 ENCOUNTER — Other Ambulatory Visit: Payer: Medicare Other

## 2017-04-15 LAB — THYROID PANEL WITH TSH
Free Thyroxine Index: 1.9 (ref 1.2–4.9)
T3 UPTAKE RATIO: 25 % (ref 24–39)
T4, Total: 7.5 ug/dL (ref 4.5–12.0)
TSH: 3.69 u[IU]/mL (ref 0.450–4.500)

## 2017-04-16 LAB — THYROGLOBULIN BY IMA: Thyroglobulin by IMA: 0.1 ng/mL — ABNORMAL LOW (ref 1.5–38.5)

## 2017-04-16 LAB — TGAB+THYROGLOBULIN IMA OR RIA: Thyroglobulin Antibody: <1 IU/mL (ref 0.0–0.9)

## 2017-04-19 ENCOUNTER — Inpatient Hospital Stay (HOSPITAL_BASED_OUTPATIENT_CLINIC_OR_DEPARTMENT_OTHER): Payer: Medicare Other | Admitting: Internal Medicine

## 2017-04-19 VITALS — BP 123/72 | HR 71 | Temp 97.9°F | Resp 16 | Wt 127.0 lb

## 2017-04-19 DIAGNOSIS — E890 Postprocedural hypothyroidism: Secondary | ICD-10-CM | POA: Diagnosis not present

## 2017-04-19 DIAGNOSIS — C73 Malignant neoplasm of thyroid gland: Secondary | ICD-10-CM

## 2017-04-19 DIAGNOSIS — D649 Anemia, unspecified: Secondary | ICD-10-CM | POA: Diagnosis not present

## 2017-04-19 DIAGNOSIS — Z85850 Personal history of malignant neoplasm of thyroid: Secondary | ICD-10-CM

## 2017-04-19 DIAGNOSIS — Z79899 Other long term (current) drug therapy: Secondary | ICD-10-CM | POA: Diagnosis not present

## 2017-04-19 DIAGNOSIS — Z923 Personal history of irradiation: Secondary | ICD-10-CM

## 2017-04-19 NOTE — Assessment & Plan Note (Addendum)
Thyroid cancer- stage III status post resection followed by radioactive iodine; patient's Synthroid.  # Clinically no recurrence of recurrence.Thyroglobulin is normal.  # Hypothyroidism /TSH suppression -currently on Synthoid 88 mcg [Goal: TSH for intermediate risk- 0.1-0.5]-given her age I would recommend TSH goal of normal.   # Mild anemia hemoglobin 10.5 chronic-stable; iron studies- N. Discussed that it is unclear if her mild anemia is the cause of her ongoing fatigue.  However, a bone marrow biopsy was discussed; and would be recommended to further evaluate the chronic anemia.  Patient wants to hold off any workup at this time.  Will follow labs  in 6 months; if worse then recommend a bone marrow biopsy.  # follow up in  3month/labs-thyroid tumor marker/labs/thyroid profile-few days prior.

## 2017-04-19 NOTE — Progress Notes (Signed)
[place] OFFICE PROGRESS NOTE  Patient Care Team: Idelle Crouch, MD as PCP - General (Internal Medicine) Rockey Situ Kathlene November, MD as Consulting Physician (Cardiology)  Cancer Staging Thyroid cancer [facility]) Staging form: Thyroid - Papillary or Follicular, AJCC 7th Edition - Clinical stage from 03/22/2014: Stage III (T3, N0, M0) - Signed by Cammie Sickle, MD on 10/22/2015 Staging form: Thyroid - Papillary or Follicular (Under 45 years), AJCC 7th Edition - Pathologic: No stage assigned - Unsigned    Oncology History   1. 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  0.5 cm tumor extending into perithyroid soft tissue status post total thyroidectomy;   One lymph node was negative.  No angiolymphatic invasion seen [Dr.McQueen]; Intermediate risk of recurrence- goal TSH 0.1- 0.5  # Status post radioactive iodine therapy in May of 2016.  # On Synthroid      Thyroid cancer [facility])      INTERVAL HISTORY:  [name] 84 y.o.  female pleasant patient above history of Stage III papillary thyroid carcinoma is here for follow-up.   Patient complains of chronic fatigue.  Otherwise denies any unusual shortness of breath or cough.  Denies any lumps in the neck; or in the underarms.  No tremors. No palpitations.  REVIEW OF SYSTEMS:  A complete 10 point review of system is done which is negative except mentioned above/history of present illness.   PAST MEDICAL HISTORY :  Past Medical History:  Diagnosis Date  . CAD (coronary artery disease)    a. lexiscan 08/2013: nl wall motion, no ischemia, EF 50-55%; b. cardiac cath 05/31/2014: mLAD 90%, dLAD 50%, dLCx 60%, 1st Mrg 80%, pRCA 60% s/p PCI/DES, mRCA 1st lesion 70%, mRCA 2nd 95% s/p PCI/long DES to cover entire mRCA, RPLB 40%;  c. 05/2014 Staged PCI of LAD w/ 2.5 x 15 mm Xience Alpine DES.  . Carotid artery disease ([place])   . Chronic anxiety   . Diverticulosis   .  Esophagitis   . GERD (gastroesophageal reflux disease)   . History of colon polyps   . History of echocardiogram    a. 08/2013: normal LVSF, nl RVSF, no valvular regurgitation or stenosis   . Hyperlipidemia   . Hypertension   . Hypothyroidism   . Mild reactive airways disease   . Osteoarthritis   . PONV (postoperative nausea and vomiting)   . Rheumatoid arthritis ([place])   . Stroke [facility])    a. 05/2014 pt c/o garbled speech following cath 5/19. MRI/A 5/26 showed left caudate infarct->conservative mgmt per neuro.  . Thyroid cancer ([place])    a. s/p right sided hemi-thyroidectomy; b. planning for radioactive iodine tx; c. followed by Dr. Marcelene Butte  . Type 2 diabetes mellitus ([place])     PAST SURGICAL HISTORY :   Past Surgical History:  Procedure Laterality Date  . ABDOMINAL HYSTERECTOMY    . APPENDECTOMY    . AUGMENTATION MAMMAPLASTY    . BACK SURGERY    . BREAST IMPLANT REMOVAL  06/2001  . CARDIAC CATHETERIZATION N/A 06/06/2014   Procedure: Coronary/Graft Atherectomy;  Surgeon: Wellington Hampshire, MD;  Location: [place] CV LAB;  Service: Cardiovascular;  Laterality: N/A;  . CARDIAC CATHETERIZATION N/A 05/31/2014   Procedure: Left Heart Cath;  Surgeon: Minna Merritts, MD;  Location: [place] CV LAB;  Service: Cardiovascular;  Laterality: N/A;  . CARDIAC CATHETERIZATION N/A 05/31/2014   Procedure: Coronary Stent Intervention;  Surgeon: Wellington Hampshire,  MD;  Location: [place] CV LAB;  Service: Cardiovascular;  Laterality: N/A;  . CARPAL TUNNEL RELEASE     "I've have a total of 7" (06/06/2014)  . CATARACT EXTRACTION W/ INTRAOCULAR LENS  IMPLANT, BILATERAL Bilateral   . CHOLECYSTECTOMY    . CORONARY ANGIOPLASTY WITH STENT PLACEMENT  05/31/2014; 06/06/2014   "2; 1"  . GANGLION CYST EXCISION Right    AC joint  . LUMBAR LAMINECTOMY  X 6  . MENISCUS REPAIR Bilateral    "2 on the right; 1 on the left"  . ROTATOR CUFF REPAIR     "I think 2 left, 2 right"  . THYROIDECTOMY    .  TONSILLECTOMY    . TUBAL LIGATION      FAMILY HISTORY :   Family History  Problem Relation Age of Onset  . Heart disease Sister        stent placed x 3     SOCIAL HISTORY:   Social History   Tobacco Use  . Smoking status: Never Smoker  . Smokeless tobacco: Never Used  Substance Use Topics  . Alcohol use: No  . Drug use: No    ALLERGIES:  is allergic to ciprofloxacin; codeine; metronidazole; sulfa antibiotics; neomycin; neomycin-bacitracin zn-polymyx; bacitracin-polymyxin b; celecoxib; petrolatum-zinc oxide; statins; and vioxx [rofecoxib].  MEDICATIONS:  Current Outpatient Medications  Medication Sig Dispense Refill  . allopurinol (ZYLOPRIM) 100 MG tablet Take 1 tablet (100 mg total) by mouth 2 (two) times daily. 60 tablet 10  . ALPRAZolam (XANAX) 0.25 MG tablet Take 0.25 mg by mouth at bedtime as needed for anxiety or sleep.     . Cholecalciferol 1000 UNITS tablet Take 1,000 Units by mouth daily.     . clopidogrel (PLAVIX) 75 MG tablet TAKE 1 TABLET DAILY 90 tablet 3  . Colchicine (MITIGARE) 0.6 MG CAPS Take 0.6 mg by mouth 2 (two) times daily. 30 capsule 2  . levothyroxine (SYNTHROID, LEVOTHROID) 88 MCG tablet Take 1 tablet (88 mcg total) by mouth daily. 30 tablet 6  . metoprolol succinate (TOPROL XL) 50 MG 24 hr tablet Take 1 tablet (50 mg total) by mouth daily. 90 tablet 3  . Multiple Vitamins-Minerals (ICAPS AREDS 2 PO) Take by mouth daily.    . nitroGLYCERIN (NITROSTAT) 0.4 MG SL tablet PLACE 1 TABLET UNDER THE TONGUE EVERY 5 MINUTES AS NEEDED FOR CHEST PAIN 25 tablet 2  . nitroGLYCERIN (NITROSTAT) 0.4 MG SL tablet PLACE 1 TABLET UNDER THE TONGUE EVERY 5 MINUTES AS NEEDED FOR CHEST PAIN 25 tablet 1  . pantoprazole (PROTONIX) 40 MG tablet TAKE 1 TABLET BY MOUTH DAILY 30 tablet 3  . ramipril (ALTACE) 10 MG capsule TAKE 1 CAPSULE(10 MG) BY MOUTH DAILY 30 capsule 3  . rosuvastatin (CRESTOR) 5 MG tablet Take 1 tablet (5 mg total) by mouth daily. 90 tablet 3  . Thiamine HCl  (VITAMIN B-1 PO) Take by mouth daily.    Marland Kitchen VITAMIN E PO Take by mouth daily.     No current facility-administered medications for this visit.     PHYSICAL EXAMINATION: ECOG PERFORMANCE STATUS: 0 - Asymptomatic  BP 123/72 (BP Location: Left Arm, Patient Position: Sitting)   Pulse 71   Temp 97.9 F (36.6 C)   Resp 16   Wt 127 lb (57.6 kg)   BMI 23.23 kg/m   Filed Weights   04/19/17 1444  Weight: 127 lb (57.6 kg)    GENERAL: Well-nourished well-developed; Alert, no distress and comfortable. Accompanied by her husband. Walks with  a cane. EYES: no pallor or icterus OROPHARYNX: no thrush or ulceration; good dentition  NECK: supple, no masses felt LYMPH:  no palpable lymphadenopathy in the cervical, axillary or inguinal regions LUNGS: clear to auscultation and  No wheeze or crackles HEART/CVS: regular rate & rhythm and no murmurs; No lower extremity edema ABDOMEN:abdomen soft, non-tender and normal bowel sounds Musculoskeletal:no cyanosis of digits and no clubbing  PSYCH: alert & oriented x 3 with fluent speech NEURO: no focal motor/sensory deficits SKIN:  no rashes or significant lesions  LABORATORY DATA:  I have reviewed the data as listed    Component Value Date/Time   NA 139 04/14/2017 1458   NA 139 05/04/2014 1058   K 4.6 04/14/2017 1458   K 4.5 05/04/2014 1058   CL 108 04/14/2017 1458   CL 105 05/04/2014 1058   CO2 22 04/14/2017 1458   CO2 26 05/04/2014 1058   GLUCOSE 93 04/14/2017 1458   GLUCOSE 161 (H) 05/04/2014 1058   BUN 32 (H) 04/14/2017 1458   BUN 20 05/04/2014 1058   CREATININE 0.91 04/14/2017 1458   CREATININE 1.10 (H) 05/04/2014 1058   CALCIUM 9.4 04/14/2017 1458   CALCIUM 9.5 05/04/2014 1058   PROT 6.7 04/14/2017 1458   PROT 6.9 05/04/2014 1058   ALBUMIN 4.0 04/14/2017 1458   ALBUMIN 4.1 05/04/2014 1058   AST 23 04/14/2017 1458   AST 26 05/04/2014 1058   ALT 14 04/14/2017 1458   ALT 17 05/04/2014 1058   ALKPHOS 72 04/14/2017 1458   ALKPHOS 52  05/04/2014 1058   BILITOT 0.6 04/14/2017 1458   BILITOT 0.6 05/04/2014 1058   GFRNONAA 58 (L) 04/14/2017 1458   GFRNONAA 48 (L) 05/04/2014 1058   GFRAA >60 04/14/2017 1458   GFRAA 56 (L) 05/04/2014 1058    No results found for: SPEP, UPEP  Lab Results  Component Value Date   WBC 7.4 04/14/2017   NEUTROABS 5.9 04/14/2017   HGB 10.5 (L) 04/14/2017   HCT 31.3 (L) 04/14/2017   MCV 91.8 04/14/2017   PLT 209 04/14/2017      Chemistry      Component Value Date/Time   NA 139 04/14/2017 1458   NA 139 05/04/2014 1058   K 4.6 04/14/2017 1458   K 4.5 05/04/2014 1058   CL 108 04/14/2017 1458   CL 105 05/04/2014 1058   CO2 22 04/14/2017 1458   CO2 26 05/04/2014 1058   BUN 32 (H) 04/14/2017 1458   BUN 20 05/04/2014 1058   CREATININE 0.91 04/14/2017 1458   CREATININE 1.10 (H) 05/04/2014 1058      Component Value Date/Time   CALCIUM 9.4 04/14/2017 1458   CALCIUM 9.5 05/04/2014 1058   ALKPHOS 72 04/14/2017 1458   ALKPHOS 52 05/04/2014 1058   AST 23 04/14/2017 1458   AST 26 05/04/2014 1058   ALT 14 04/14/2017 1458   ALT 17 05/04/2014 1058   BILITOT 0.6 04/14/2017 1458   BILITOT 0.6 05/04/2014 1058       RADIOGRAPHIC STUDIES: I have personally reviewed the radiological images as listed and agreed with the findings in the report. No results found.   ASSESSMENT & PLAN:  Thyroid cancer ([place]) Thyroid cancer- stage III status post resection followed by radioactive iodine; patient's Synthroid.  # Clinically no recurrence of recurrence.Thyroglobulin is normal.  # Hypothyroidism /TSH suppression -currently on Synthoid 88 mcg [Goal: TSH for intermediate risk- 0.1-0.5]-given her age I would recommend TSH goal of normal.   # Mild anemia hemoglobin  10.5 chronic-stable; iron studies- N. Discussed that it is unclear if her mild anemia is the cause of her ongoing fatigue.  However, a bone marrow biopsy was discussed; and would be recommended to further evaluate the chronic anemia.   Patient wants to hold off any workup at this time.  Will follow labs  in 6 months; if worse then recommend a bone marrow biopsy.  # follow up in  3month/labs-thyroid tumor marker/labs/thyroid profile-few days prior.    No orders of the defined types were placed in this encounter.  All questions were answered. The patient knows to call the clinic with any problems, questions or concerns.      GCammie Sickle MD 04/20/2017 7:44 AM

## 2017-05-05 ENCOUNTER — Other Ambulatory Visit: Payer: Self-pay | Admitting: Cardiovascular Disease

## 2017-05-08 ENCOUNTER — Other Ambulatory Visit: Payer: Self-pay | Admitting: Internal Medicine

## 2017-05-08 DIAGNOSIS — C73 Malignant neoplasm of thyroid gland: Secondary | ICD-10-CM

## 2017-06-04 ENCOUNTER — Other Ambulatory Visit: Payer: Self-pay | Admitting: Internal Medicine

## 2017-06-04 DIAGNOSIS — R270 Ataxia, unspecified: Secondary | ICD-10-CM

## 2017-06-07 ENCOUNTER — Other Ambulatory Visit: Payer: Self-pay | Admitting: Cardiovascular Disease

## 2017-06-07 ENCOUNTER — Other Ambulatory Visit: Payer: Self-pay | Admitting: Internal Medicine

## 2017-06-07 DIAGNOSIS — C73 Malignant neoplasm of thyroid gland: Secondary | ICD-10-CM

## 2017-06-08 NOTE — Telephone Encounter (Signed)
Next appt:  06/09/2017 at 10:00 AM in Radiology ([facility])   Ref Range & Units 34mo ago  Thyroglobulin by IMA 1.5 - 38.5 ng/mL 0.1Low    Comment: (NOTE)        Dx:  Thyroid cancer ([place])   Ref Range & Units 67mo ago  TSH 0.450 - 4.500 uIU/mL 3.690   T4, Total 4.5 - 12.0 ug/dL 7.5   T3 Uptake Ratio 24 - 39 % 25   Free Thyroxine Index 1.2 - 4.9 1.9        Dx:  Thyroid cancer ([facility])   Ref Range & Units 25mo ago  Thyroglobulin Antibody 0.0 - 0.9 IU/mL <1.0

## 2017-06-09 ENCOUNTER — Ambulatory Visit
Admission: RE | Admit: 2017-06-09 | Discharge: 2017-06-09 | Disposition: A | Discharge status: Home / Self Care | Payer: Medicare Other | Source: Ambulatory Visit | Attending: Internal Medicine | Admitting: Internal Medicine

## 2017-06-09 DIAGNOSIS — M1288 Other specific arthropathies, not elsewhere classified, other specified site: Secondary | ICD-10-CM | POA: Diagnosis not present

## 2017-06-09 DIAGNOSIS — M47812 Spondylosis without myelopathy or radiculopathy, cervical region: Secondary | ICD-10-CM | POA: Diagnosis not present

## 2017-06-09 DIAGNOSIS — R270 Ataxia, unspecified: Secondary | ICD-10-CM

## 2017-06-29 ENCOUNTER — Other Ambulatory Visit: Payer: Self-pay

## 2017-06-29 ENCOUNTER — Telehealth: Payer: Self-pay | Admitting: Cardiovascular Disease

## 2017-06-29 ENCOUNTER — Ambulatory Visit (INDEPENDENT_AMBULATORY_CARE_PROVIDER_SITE_OTHER): Payer: Medicare Other | Admitting: Orthopedic Surgery

## 2017-06-29 ENCOUNTER — Encounter (INDEPENDENT_AMBULATORY_CARE_PROVIDER_SITE_OTHER): Payer: Self-pay | Admitting: Orthopedic Surgery

## 2017-06-29 DIAGNOSIS — M6701 Short Achilles tendon (acquired), right ankle: Secondary | ICD-10-CM | POA: Diagnosis not present

## 2017-06-29 DIAGNOSIS — M1A09X0 Idiopathic chronic gout, multiple sites, without tophus (tophi): Secondary | ICD-10-CM | POA: Diagnosis not present

## 2017-06-29 DIAGNOSIS — M6702 Short Achilles tendon (acquired), left ankle: Secondary | ICD-10-CM

## 2017-06-29 DIAGNOSIS — I2510 Atherosclerotic heart disease of native coronary artery without angina pectoris: Secondary | ICD-10-CM

## 2017-06-29 MED ORDER — COLCHICINE 0.6 MG PO TABS: 0.6000 mg | ORAL_TABLET | Freq: Every day | ORAL | 4 refills | Status: DC

## 2017-06-29 MED ORDER — CLOPIDOGREL BISULFATE 75 MG PO TABS: 75.0000 mg | ORAL_TABLET | Freq: Every day | ORAL | 0 refills | Status: DC

## 2017-06-29 NOTE — Telephone Encounter (Signed)
°*  STAT* If patient is at the pharmacy, call can be transferred to refill team.   1. Which medications need to be refilled? (please list name of each medication and dose if known) Clopidogrel 75 mg   2. Which pharmacy/location (including street and city if local pharmacy) is medication to be sent to? [facility] on [address]  3. Do they need a 30 day or 90 day supply? Just need a week or two until mail order comes in

## 2017-06-29 NOTE — Telephone Encounter (Signed)
clopidogrel (PLAVIX) 75 MG tablet 14 tablet 0 06/29/2017    Sig - Route: Take 1 tablet (75 mg total) by mouth daily. - Oral   Sent to pharmacy as: clopidogrel (PLAVIX) 75 MG tablet   E-Prescribing Status: Receipt confirmed by pharmacy (06/29/2017 10:05 AM EDT)   Associated Diagnoses   Coronary artery disease involving native coronary artery of native heart without angina pectoris     Pharmacy   [id number] - [place], [place] AT [address]

## 2017-06-29 NOTE — Progress Notes (Signed)
Office Visit Note   Patient: [name]           Date of Birth: December 28, 1933           MRN: [id number] Visit Date: 06/29/2017              Requested by: Idelle Crouch, MD [facility] [place], [place] [address] PCP: Idelle Crouch, MD  Chief Complaint  Patient presents with  . Follow-up    70mo f/u      HPI: Patient presents in follow-up for both feet she states that the colchicine still works well she states she has had burning pain across the forefoot bilaterally.  Assessment & Plan: Visit Diagnoses:  1. Achilles tendon contracture, bilateral   2. Idiopathic chronic gout of multiple sites without tophus     Plan: Patient was given a 12-month refill prescription for the colchicine.  She was given instructions and demonstrated heel cord stretching.  Follow-Up Instructions: Return in about 3 months (around 09/29/2017).   Ortho Exam  Patient is alert, oriented, no adenopathy, well-dressed, normal affect, normal respiratory effort. Examination patient has an antalgic gait she does have a tight Achilles bilaterally with dorsiflexion just short of neutral with her knee extended.  She has a prominent first metatarsal head with plantarflexion of the first ray.  She has a good dorsalis pedis pulse bilaterally there is no redness no cellulitis no swelling in any of her joints.  No pain with range of motion of the MTP joint.  Imaging: No results found. No images are attached to the encounter.  Labs: Lab Results  Component Value Date   ESRSEDRATE 21 10/31/2013   LABURIC 3.9 12/29/2016   LABURIC 5.2 06/22/2016   LABURIC 4.4 03/17/2016   REPTSTATUS 03/20/2016 FINAL 03/18/2016   REPTSTATUS 04/08/2016 FINAL 03/18/2016   CULT  03/18/2016    MODERATE STAPHYLOCOCCUS SPECIES (COAGULASE NEGATIVE) CALL MICROBIOLOGY LAB IF SENSITIVITIES ARE REQUIRED. Performed at [facility], [place] [address], [place], [place] [address]    CULT  03/18/2016    NO FUNGUS ISOLATED AFTER 21 DAYS Performed at [facility], [place] [address], [place],  [address]      Lab Results  Component Value Date   ALBUMIN 4.0 04/14/2017   ALBUMIN 3.9 10/12/2016   ALBUMIN 4.4 04/13/2016   LABURIC 3.9 12/29/2016   LABURIC 5.2 06/22/2016   LABURIC 4.4 03/17/2016    There is no height or weight on file to calculate BMI.  Orders:  No orders of the defined types were placed in this encounter.  Meds ordered this encounter  Medications  . colchicine (COLCRYS) 0.6 MG tablet    Sig: Take 1 tablet (0.6 mg total) by mouth daily.    Dispense:  90 tablet    Refill:  4     Procedures: No procedures performed  Clinical Data: No additional findings.  ROS:  All other systems negative, except as noted in the HPI. Review of Systems  Objective: Vital Signs: There were no vitals taken for this visit.  Specialty Comments:  No specialty comments available.  PMFS History: Patient Active Problem List   Diagnosis Date Noted  . Achilles tendon contracture, bilateral 06/29/2017  . Acute bilateral low back pain without sciatica 03/17/2016  . Idiopathic chronic gout of multiple sites without tophus 03/17/2016  . Idiopathic gout of multiple sites 11/27/2015  . Cellulitis 05/15/2015  . Bilateral leg edema 02/08/2015  . Chronic anxiety 11/22/2014  .  Cerebrovascular accident (CVA) ([place]) 07/25/2014  . Arterial vascular disease 07/25/2014  . Swelling of right lower extremity 06/22/2014  . Type 2 diabetes mellitus ([place])   . Stroke ([place]) 06/08/2014  . Anemia 06/07/2014  . Unstable angina ([place]) 06/06/2014  . Carotid artery disease ([place])   . CAD (coronary artery disease)   . Hyperlipidemia   . Hypertension   . Angina pectoris ([place]) 05/31/2014  . SOB (shortness of breath) 04/11/2014  . Palpitations 04/11/2014  . Other fatigue 04/11/2014  . Thyroid cancer ([place]) 04/11/2014   Past Medical History:  Diagnosis Date  . CAD (coronary artery disease)      a. lexiscan 08/2013: nl wall motion, no ischemia, EF 50-55%; b. cardiac cath 05/31/2014: mLAD 90%, dLAD 50%, dLCx 60%, 1st Mrg 80%, pRCA 60% s/p PCI/DES, mRCA 1st lesion 70%, mRCA 2nd 95% s/p PCI/long DES to cover entire mRCA, RPLB 40%;  c. 05/2014 Staged PCI of LAD w/ 2.5 x 15 mm Xience Alpine DES.  . Carotid artery disease ([place])   . Chronic anxiety   . Diverticulosis   . Esophagitis   . GERD (gastroesophageal reflux disease)   . History of colon polyps   . History of echocardiogram    a. 08/2013: normal LVSF, nl RVSF, no valvular regurgitation or stenosis   . Hyperlipidemia   . Hypertension   . Hypothyroidism   . Mild reactive airways disease   . Osteoarthritis   . PONV (postoperative nausea and vomiting)   . Rheumatoid arthritis ([place])   . Stroke [facility])    a. 05/2014 pt c/o garbled speech following cath 5/19. MRI/A 5/26 showed left caudate infarct->conservative mgmt per neuro.  . Thyroid cancer ([place])    a. s/p right sided hemi-thyroidectomy; b. planning for radioactive iodine tx; c. followed by Dr. Marcelene Butte  . Type 2 diabetes mellitus ([facility])     Family History  Problem Relation Age of Onset  . Heart disease Sister        stent placed x 3     Past Surgical History:  Procedure Laterality Date  . ABDOMINAL HYSTERECTOMY    . APPENDECTOMY    . AUGMENTATION MAMMAPLASTY    . BACK SURGERY    . BREAST IMPLANT REMOVAL  06/2001  . CARDIAC CATHETERIZATION N/A 06/06/2014   Procedure: Coronary/Graft Atherectomy;  Surgeon: Wellington Hampshire, MD;  Location: [place] CV LAB;  Service: Cardiovascular;  Laterality: N/A;  . CARDIAC CATHETERIZATION N/A 05/31/2014   Procedure: Left Heart Cath;  Surgeon: Minna Merritts, MD;  Location: [place] CV LAB;  Service: Cardiovascular;  Laterality: N/A;  . CARDIAC CATHETERIZATION N/A 05/31/2014   Procedure: Coronary Stent Intervention;  Surgeon: Wellington Hampshire, MD;  Location: [place] CV LAB;  Service: Cardiovascular;  Laterality: N/A;  . CARPAL  TUNNEL RELEASE     "I've have a total of 7" (06/06/2014)  . CATARACT EXTRACTION W/ INTRAOCULAR LENS  IMPLANT, BILATERAL Bilateral   . CHOLECYSTECTOMY    . CORONARY ANGIOPLASTY WITH STENT PLACEMENT  05/31/2014; 06/06/2014   "2; 1"  . GANGLION CYST EXCISION Right    AC joint  . LUMBAR LAMINECTOMY  X 6  . MENISCUS REPAIR Bilateral    "2 on the right; 1 on the left"  . ROTATOR CUFF REPAIR     "I think 2 left, 2 right"  . THYROIDECTOMY    . TONSILLECTOMY    . TUBAL LIGATION     Social History   Occupational History  . Occupation: retired  Tobacco Use  . Smoking status: Never Smoker  . Smokeless tobacco: Never Used  Substance and Sexual Activity  . Alcohol use: No  . Drug use: No  . Sexual activity: Not Currently

## 2017-07-01 ENCOUNTER — Other Ambulatory Visit (INDEPENDENT_AMBULATORY_CARE_PROVIDER_SITE_OTHER): Payer: Self-pay

## 2017-07-01 ENCOUNTER — Telehealth (INDEPENDENT_AMBULATORY_CARE_PROVIDER_SITE_OTHER): Payer: Self-pay

## 2017-07-01 MED ORDER — COLCHICINE 0.6 MG PO CAPS: 0.6000 mg | ORAL_CAPSULE | Freq: Two times a day (BID) | ORAL | 2 refills | Status: DC

## 2017-07-01 NOTE — Telephone Encounter (Signed)
[facility] [address] sent fax rx for colchicine not covered will cover mitigare this was faxed to pharm

## 2017-07-09 ENCOUNTER — Other Ambulatory Visit: Payer: Self-pay | Admitting: Internal Medicine

## 2017-07-09 DIAGNOSIS — C73 Malignant neoplasm of thyroid gland: Secondary | ICD-10-CM

## 2017-07-09 NOTE — Telephone Encounter (Signed)
Dx:  Thyroid cancer ([place])   Ref Range & Units 66mo ago  TSH 0.450 - 4.500 uIU/mL 3.690   T4, Total 4.5 - 12.0 ug/dL 7.5   T3 Uptake Ratio 24 - 39 % 25   Free Thyroxine Index 1.2 - 4.9 1.9

## 2017-07-22 ENCOUNTER — Other Ambulatory Visit: Payer: Self-pay | Admitting: Cardiovascular Disease

## 2017-08-08 ENCOUNTER — Other Ambulatory Visit: Payer: Self-pay | Admitting: Internal Medicine

## 2017-08-08 DIAGNOSIS — C73 Malignant neoplasm of thyroid gland: Secondary | ICD-10-CM

## 2017-08-09 NOTE — Telephone Encounter (Signed)
   Ref Range & Units 26mo ago  Thyroglobulin by IMA 1.5 - 38.5 ng/mL 0.1Low          Ref Range & Units 79mo ago  Thyroglobulin Antibody 0.0 - 0.9 IU/mL <1.0         Ref Range & Units 105mo ago  TSH 0.450 - 4.500 uIU/mL 3.690   T4, Total 4.5 - 12.0 ug/dL 7.5   T3 Uptake Ratio 24 - 39 % 25   Free Thyroxine Index 1.2 - 4.9 1.9

## 2017-09-09 ENCOUNTER — Other Ambulatory Visit: Payer: Self-pay | Admitting: Internal Medicine

## 2017-09-09 DIAGNOSIS — C73 Malignant neoplasm of thyroid gland: Secondary | ICD-10-CM

## 2017-09-09 NOTE — Telephone Encounter (Signed)
   Ref Range & Units 86mo ago  TSH 0.450 - 4.500 uIU/mL 3.690   T4, Total 4.5 - 12.0 ug/dL 7.5   T3 Uptake Ratio 24 - 39 % 25   Free Thyroxine Index 1.2 - 4.9 1.9        )   Ref Range & Units 66mo ago  Thyroglobulin Antibody 0.0 - 0.9 IU/mL <1.0        )   Ref Range & Units 57mo ago  Thyroglobulin by IMA 1.5 - 38.5 ng/mL 0.1Low

## 2017-09-23 ENCOUNTER — Other Ambulatory Visit: Payer: Self-pay

## 2017-09-23 DIAGNOSIS — I2510 Atherosclerotic heart disease of native coronary artery without angina pectoris: Secondary | ICD-10-CM

## 2017-09-23 MED ORDER — CLOPIDOGREL BISULFATE 75 MG PO TABS: 75.0000 mg | ORAL_TABLET | Freq: Every day | ORAL | 0 refills | Status: DC

## 2017-09-23 NOTE — Telephone Encounter (Signed)
Requested Prescriptions   Signed Prescriptions Disp Refills  . clopidogrel (PLAVIX) 75 MG tablet 7 tablet 0    Sig: Take 1 tablet (75 mg total) by mouth daily.    Authorizing Provider: Minna Merritts    Ordering User: Britt Bottom

## 2017-09-23 NOTE — Telephone Encounter (Signed)
*  STAT* If patient is at the pharmacy, call can be transferred to refill team.   1. Which medications need to be refilled? (please list name of each medication and dose if known) Plavix  2. Which pharmacy/location (including street and city if local pharmacy) is medication to be sent to? [place]  3. Do they need a 30 day or 90 day supply? Pt states she only needs 5 pills until her mail order comes in.

## 2017-09-27 ENCOUNTER — Emergency Department
Admission: EM | Admit: 2017-09-27 | Discharge: 2017-09-27 | Disposition: A | Discharge status: Home / Self Care | Payer: Medicare Other | Attending: Emergency Medicine | Admitting: Emergency Medicine

## 2017-09-27 ENCOUNTER — Telehealth: Payer: Self-pay | Admitting: Cardiovascular Disease

## 2017-09-27 ENCOUNTER — Encounter: Payer: Self-pay | Admitting: Emergency Medicine

## 2017-09-27 ENCOUNTER — Emergency Department: Payer: Medicare Other

## 2017-09-27 ENCOUNTER — Other Ambulatory Visit: Payer: Self-pay

## 2017-09-27 DIAGNOSIS — Z8673 Personal history of transient ischemic attack (TIA), and cerebral infarction without residual deficits: Secondary | ICD-10-CM | POA: Diagnosis not present

## 2017-09-27 DIAGNOSIS — Z79899 Other long term (current) drug therapy: Secondary | ICD-10-CM | POA: Diagnosis not present

## 2017-09-27 DIAGNOSIS — R079 Chest pain, unspecified: Secondary | ICD-10-CM | POA: Diagnosis not present

## 2017-09-27 DIAGNOSIS — Z85850 Personal history of malignant neoplasm of thyroid: Secondary | ICD-10-CM | POA: Insufficient documentation

## 2017-09-27 DIAGNOSIS — Z955 Presence of coronary angioplasty implant and graft: Secondary | ICD-10-CM | POA: Insufficient documentation

## 2017-09-27 DIAGNOSIS — F419 Anxiety disorder, unspecified: Secondary | ICD-10-CM | POA: Diagnosis not present

## 2017-09-27 DIAGNOSIS — E119 Type 2 diabetes mellitus without complications: Secondary | ICD-10-CM | POA: Diagnosis not present

## 2017-09-27 DIAGNOSIS — I10 Essential (primary) hypertension: Secondary | ICD-10-CM | POA: Insufficient documentation

## 2017-09-27 DIAGNOSIS — Z9049 Acquired absence of other specified parts of digestive tract: Secondary | ICD-10-CM | POA: Diagnosis not present

## 2017-09-27 DIAGNOSIS — E039 Hypothyroidism, unspecified: Secondary | ICD-10-CM | POA: Insufficient documentation

## 2017-09-27 DIAGNOSIS — I2510 Atherosclerotic heart disease of native coronary artery without angina pectoris: Secondary | ICD-10-CM | POA: Diagnosis not present

## 2017-09-27 DIAGNOSIS — Z7902 Long term (current) use of antithrombotics/antiplatelets: Secondary | ICD-10-CM | POA: Diagnosis not present

## 2017-09-27 LAB — TROPONIN I: Troponin I: <0.03 ng/mL (ref <0.03)

## 2017-09-27 LAB — BASIC METABOLIC PANEL
ANION GAP: 7 (ref 5–15)
BUN: 29 mg/dL — AB (ref 8–23)
CHLORIDE: 108 mmol/L (ref 98–111)
CO2: 24 mmol/L (ref 22–32)
Calcium: 8.9 mg/dL (ref 8.9–10.3)
Creatinine, Ser: 0.79 mg/dL (ref 0.44–1.00)
GFR calc Af Amer: >60 mL/min (ref >60)
GFR calc non Af Amer: >60 mL/min (ref >60)
GLUCOSE: 110 mg/dL — AB (ref 70–99)
POTASSIUM: 4.1 mmol/L (ref 3.5–5.1)
Sodium: 139 mmol/L (ref 135–145)

## 2017-09-27 LAB — CBC
HEMATOCRIT: 31.2 % — AB (ref 35.0–47.0)
HEMOGLOBIN: 10.8 g/dL — AB (ref 12.0–16.0)
MCH: 31.2 pg (ref 26.0–34.0)
MCHC: 34.5 g/dL (ref 32.0–36.0)
MCV: 90.5 fL (ref 80.0–100.0)
Platelets: 184 10*3/uL (ref 150–440)
RBC: 3.45 MIL/uL — ABNORMAL LOW (ref 3.80–5.20)
RDW: 15.6 % — AB (ref 11.5–14.5)
WBC: 5.4 10*3/uL (ref 3.6–11.0)

## 2017-09-27 NOTE — ED Provider Notes (Signed)
[facility] Emergency Department Provider Note  Time seen: 4:51 PM  I have reviewed the triage vital signs and the nursing notes.   HISTORY  Chief Complaint Chest Pain    HPI [name] is a 84 y.o. female with a past medical history of CAD status post cardiac stents in 2016 presents to the emergency department for chest pain.  According to the patient for the past 2 days she has been having intermittent chest pains, states they are very mild, coming ago denies any chest pain currently.  Denies any trouble breathing nausea or diaphoresis.  Patient was supposed to have a tooth pulled today however while at the dentist she did tell him about the chest pain but they called her cardiologist Dr. Rockey Situ who recommended she come to the emergency department for evaluation.  Currently the patient appears well, has no complaints at this time.   Past Medical History:  Diagnosis Date  . CAD (coronary artery disease)    a. lexiscan 08/2013: nl wall motion, no ischemia, EF 50-55%; b. cardiac cath 05/31/2014: mLAD 90%, dLAD 50%, dLCx 60%, 1st Mrg 80%, pRCA 60% s/p PCI/DES, mRCA 1st lesion 70%, mRCA 2nd 95% s/p PCI/long DES to cover entire mRCA, RPLB 40%;  c. 05/2014 Staged PCI of LAD w/ 2.5 x 15 mm Xience Alpine DES.  . Carotid artery disease ([place])   . Chronic anxiety   . Diverticulosis   . Esophagitis   . GERD (gastroesophageal reflux disease)   . History of colon polyps   . History of echocardiogram    a. 08/2013: normal LVSF, nl RVSF, no valvular regurgitation or stenosis   . Hyperlipidemia   . Hypertension   . Hypothyroidism   . Mild reactive airways disease   . Osteoarthritis   . PONV (postoperative nausea and vomiting)   . Rheumatoid arthritis ([place])   . Stroke [facility])    a. 05/2014 pt c/o garbled speech following cath 5/19. MRI/A 5/26 showed left caudate infarct->conservative mgmt per neuro.  . Thyroid cancer ([place])    a. s/p right sided hemi-thyroidectomy; b.  planning for radioactive iodine tx; c. followed by Dr. Marcelene Butte  . Type 2 diabetes mellitus [facility])     Patient Active Problem List   Diagnosis Date Noted  . Achilles tendon contracture, bilateral 06/29/2017  . Acute bilateral low back pain without sciatica 03/17/2016  . Idiopathic chronic gout of multiple sites without tophus 03/17/2016  . Idiopathic gout of multiple sites 11/27/2015  . Cellulitis 05/15/2015  . Bilateral leg edema 02/08/2015  . Chronic anxiety 11/22/2014  . Cerebrovascular accident (CVA) ([place]) 07/25/2014  . Arterial vascular disease 07/25/2014  . Swelling of right lower extremity 06/22/2014  . Type 2 diabetes mellitus ([place])   . Stroke ([place]) 06/08/2014  . Anemia 06/07/2014  . Unstable angina ([place]) 06/06/2014  . Carotid artery disease ([place])   . CAD (coronary artery disease)   . Hyperlipidemia   . Hypertension   . Angina pectoris ([place]) 05/31/2014  . SOB (shortness of breath) 04/11/2014  . Palpitations 04/11/2014  . Other fatigue 04/11/2014  . Thyroid cancer ([place]) 04/11/2014    Past Surgical History:  Procedure Laterality Date  . ABDOMINAL HYSTERECTOMY    . APPENDECTOMY    . AUGMENTATION MAMMAPLASTY    . BACK SURGERY    . BREAST IMPLANT REMOVAL  06/2001  . CARDIAC CATHETERIZATION N/A 06/06/2014   Procedure: Coronary/Graft Atherectomy;  Surgeon: Wellington Hampshire, MD;  Location: [place] CV LAB;  Service: Cardiovascular;  Laterality: N/A;  . CARDIAC CATHETERIZATION N/A 05/31/2014   Procedure: Left Heart Cath;  Surgeon: Minna Merritts, MD;  Location: [place] CV LAB;  Service: Cardiovascular;  Laterality: N/A;  . CARDIAC CATHETERIZATION N/A 05/31/2014   Procedure: Coronary Stent Intervention;  Surgeon: Wellington Hampshire, MD;  Location: [place] CV LAB;  Service: Cardiovascular;  Laterality: N/A;  . CARPAL TUNNEL RELEASE     "I've have a total of 7" (06/06/2014)  . CATARACT EXTRACTION W/ INTRAOCULAR LENS  IMPLANT, BILATERAL Bilateral   . CHOLECYSTECTOMY     . CORONARY ANGIOPLASTY WITH STENT PLACEMENT  05/31/2014; 06/06/2014   "2; 1"  . GANGLION CYST EXCISION Right    AC joint  . LUMBAR LAMINECTOMY  X 6  . MENISCUS REPAIR Bilateral    "2 on the right; 1 on the left"  . ROTATOR CUFF REPAIR     "I think 2 left, 2 right"  . THYROIDECTOMY    . TONSILLECTOMY    . TUBAL LIGATION      Prior to Admission medications   Medication Sig Start Date End Date Taking? Authorizing Provider  allopurinol (ZYLOPRIM) 100 MG tablet Take 1 tablet (100 mg total) by mouth 2 (two) times daily. 12/29/16   Newt Minion, MD  ALPRAZolam Duanne Moron) 0.25 MG tablet Take 0.25 mg by mouth at bedtime as needed for anxiety or sleep.  08/23/13   [provider]  Cholecalciferol 1000 UNITS tablet Take 1,000 Units by mouth daily.     [provider]  clopidogrel (PLAVIX) 75 MG tablet Take 1 tablet (75 mg total) by mouth daily. 09/23/17   Minna Merritts, MD  colchicine (COLCRYS) 0.6 MG tablet Take 1 tablet (0.6 mg total) by mouth daily. 06/29/17   Newt Minion, MD  Colchicine (MITIGARE) 0.6 MG CAPS Take 0.6 mg by mouth 2 (two) times daily. 07/01/17   Newt Minion, MD  levothyroxine (SYNTHROID, LEVOTHROID) 88 MCG tablet TAKE 1 TABLET(88 MCG) BY MOUTH DAILY 09/09/17   Cammie Sickle, MD  metoprolol succinate (TOPROL XL) 50 MG 24 hr tablet Take 1 tablet (50 mg total) by mouth daily. 05/13/15   Minna Merritts, MD  Multiple Vitamins-Minerals (ICAPS AREDS 2 PO) Take by mouth daily.    [provider]  nitroGLYCERIN (NITROSTAT) 0.4 MG SL tablet PLACE 1 TABLET UNDER THE TONGUE EVERY 5 MINUTES AS NEEDED FOR CHEST PAIN 10/01/15   Minna Merritts, MD  nitroGLYCERIN (NITROSTAT) 0.4 MG SL tablet PLACE 1 TABLET UNDER THE TONGUE EVERY 5 MINUTES AS NEEDED FOR CHEST PAIN 06/08/17   Minna Merritts, MD  pantoprazole (PROTONIX) 40 MG tablet TAKE 1 TABLET BY MOUTH DAILY 07/22/17   Minna Merritts, MD  ramipril (ALTACE) 10 MG capsule TAKE 1 CAPSULE(10 MG) BY MOUTH  DAILY 05/13/15   Minna Merritts, MD  rosuvastatin (CRESTOR) 5 MG tablet TAKE 1 TABLET DAILY 05/05/17   Minna Merritts, MD  Thiamine HCl (VITAMIN B-1 PO) Take by mouth daily.    [provider]  VITAMIN E PO Take by mouth daily.    [provider]    Allergies  Allergen Reactions  . Ciprofloxacin Diarrhea and Nausea And Vomiting  . Codeine Diarrhea and Nausea And Vomiting  . Metronidazole Diarrhea, Nausea And Vomiting and Nausea Only  . Sulfa Antibiotics Other (See Comments) and Hives    Distress Distress  . Neomycin Itching  . Neomycin-Bacitracin Zn-Polymyx Other (See Comments)    Reaction:  Unknown  Reaction:  Unknown   . Bacitracin-Polymyxin B Rash  . Celecoxib Itching, Rash and Other (See Comments)  . Petrolatum-Zinc Oxide Hives  . Statins Other (See Comments)    Reaction:  Muscle pain Reaction:  Muscle pain  . Vioxx [Rofecoxib] Itching and Rash    Family History  Problem Relation Age of Onset  . Heart disease Sister        stent placed x 3     Social History Social History   Tobacco Use  . Smoking status: Never Smoker  . Smokeless tobacco: Never Used  Substance Use Topics  . Alcohol use: No  . Drug use: No    Review of Systems Constitutional: Negative for fever. Cardiovascular: Positive for intermittent chest pain.  None currently. Respiratory: Negative for shortness of breath. Gastrointestinal: Negative for abdominal pain, vomiting and diarrhea. Genitourinary: Negative for urinary compaints Musculoskeletal: Negative for new leg pain or swelling Skin: Negative for skin complaints  Neurological: Negative for headache All other ROS negative  ____________________________________________   PHYSICAL EXAM:  VITAL SIGNS: ED Triage Vitals  Enc Vitals Group     BP 09/27/17 1314 (!) 142/60     Pulse Rate 09/27/17 1314 77     Resp 09/27/17 1314 18     Temp 09/27/17 1314 98.4 F (36.9 C)     Temp Source 09/27/17 1314 Oral     SpO2  09/27/17 1314 100 %     Weight 09/27/17 1312 130 lb (59 kg)     Height 09/27/17 1312 5\' 2"  (1.575 m)     Head Circumference --      Peak Flow --      Pain Score 09/27/17 1312 3     Pain Loc --      Pain Edu? --      Excl. in [place]? --    Constitutional: Alert and oriented. Well appearing and in no distress. Eyes: Normal exam ENT   Head: Normocephalic and atraumatic.   Mouth/Throat: Mucous membranes are moist. Cardiovascular: Normal rate, regular rhythm. No murmur Respiratory: Normal respiratory effort without tachypnea nor retractions. Breath sounds are clear Gastrointestinal: Soft and nontender. No distention.   Musculoskeletal: Nontender with normal range of motion in all extremities. Neurologic:  Normal speech and language. No gross focal neurologic deficits  Skin:  Skin is warm, dry and intact.  Psychiatric: Mood and affect are normal.   ____________________________________________    EKG  EKG reviewed and interpreted by myself shows normal sinus rhythm at 75 bpm with a narrow QRS, normal axis, normal intervals, no concerning ST changes.  ____________________________________________    RADIOLOGY  Chest x-ray is clear  ____________________________________________   INITIAL IMPRESSION / ASSESSMENT AND PLAN / ED COURSE  Pertinent labs & imaging results that were available during my care of the patient were reviewed by me and considered in my medical decision making (see chart for details).  Patient presents to the emergency department for 2 days of intermittent chest pain denies any currently.  Was referred by her dentist and cardiologist to come to the ED for evaluation.  Here the patient denies any pain at this time.  No shortness of breath nausea or diaphoresis at any point.  Chest wall is nontender to palpation.  Patient's lab work is reassuring including a negative cardiac enzyme.  EKG appears normal, chest x-ray is clear.  We will repeat a troponin.  Sounds the  patient's repeat troponin result normal anticipate likely discharge home with cardiology follow-up this  week with Dr. Rockey Situ.  Patient agreeable to plan of care.  Repeat troponin remains negative.  We will discharge patient home with cardiology follow-up this week.  Patient agreeable to plan of care.  ____________________________________________   FINAL CLINICAL IMPRESSION(S) / ED DIAGNOSES  Chest pain    Harvest Dark, MD 09/27/17 1752

## 2017-09-27 NOTE — Telephone Encounter (Signed)
Spoke with Leafy Ro over at oral surgeon's office and they are going to discharge patient to ED for further evaluation of her chest pain. No further questions at this time.

## 2017-09-27 NOTE — ED Notes (Signed)
Pt ambulatory to POV without difficulty. VSS. NAD. Discharge instructions, follow up reviewed. All questions and concerns addressed.

## 2017-09-27 NOTE — ED Triage Notes (Signed)
Patient reports left-sided chest pain that started last night. Denies SOB, N/V, dizziness. Patient reports history of MI and stent placement.

## 2017-09-27 NOTE — Telephone Encounter (Signed)
Pt c/o of Chest Pain: STAT if CP now or developed within 24 hours  1. Are you having CP right now? yes  2. Are you experiencing any other symptoms (ex. SOB, nausea, vomiting, sweating)? SOB  3. How long have you been experiencing CP? Since breakfast this a.m.  4. Is your CP continuous or coming and going? conintual   5. Have you taken Nitroglycerin? 2 NItro before she went to the oral surgeon.  ?

## 2017-09-28 ENCOUNTER — Telehealth: Payer: Self-pay | Admitting: Cardiovascular Disease

## 2017-09-28 NOTE — Telephone Encounter (Signed)
Spoke to patient who was seen in ED on 09/27/17 for CP  She was not in a state to schedule follow up will try again in

## 2017-09-29 ENCOUNTER — Ambulatory Visit (INDEPENDENT_AMBULATORY_CARE_PROVIDER_SITE_OTHER): Payer: Medicare Other | Admitting: Orthopedic Surgery

## 2017-09-30 NOTE — Telephone Encounter (Signed)
I called and spoke with the patient. She states she does not wish to proceed with a stress test at this time.  I offered to schedule a follow up appointment for her to come in and be seen.  The patient states "it seems like every time I get a call from your office I'm out somewhere else. I will need to go home and look at my calendar, I'm so busy." The patient states we are "on my list" to call and schedule. I advised her we will wait for a call back from her.

## 2017-09-30 NOTE — Telephone Encounter (Signed)
-----   Message from Minna Merritts, MD sent at 09/30/2017  8:07 AM EDT ----- Regarding: FW: patient with chest pain Would discuss with patient whether she would like to order stress test for chest pain Tg  ----- Message ----- From: Harvest Dark, MD Sent: 09/27/2017   5:52 PM EDT To: Minna Merritts, MD Subject: patient with chest pain                        Hi Dr. Rockey Situ, I saw the patient appears today in the emergency department [name].  She presented for intermittent chest discomfort over the past 2 days denied any chest pain while in the emergency department and denied any nausea shortness of breath or diaphoresis at any point.  Overall the patient appeared very well on my examination.  Patient's lab work including 2 sets of troponins were negative with a normal chest x-ray and reassuring EKG.  I discharged the patient home but I asked her to follow-up with your office this week. Thank you for your help, Harvest Dark MD [facility] ED)

## 2017-10-05 ENCOUNTER — Ambulatory Visit (INDEPENDENT_AMBULATORY_CARE_PROVIDER_SITE_OTHER): Payer: Medicare Other | Admitting: Orthopedic Surgery

## 2017-10-10 ENCOUNTER — Other Ambulatory Visit: Payer: Self-pay | Admitting: Internal Medicine

## 2017-10-10 DIAGNOSIS — C73 Malignant neoplasm of thyroid gland: Secondary | ICD-10-CM

## 2017-10-11 NOTE — Telephone Encounter (Signed)
   Ref Range & Units 60mo ago  Thyroglobulin by IMA 1.5 - 38.5 ng/mL 0.1Low           Ref Range & Units 66mo ago  Thyroglobulin Antibody 0.0 - 0.9 IU/mL <1.0          Ref Range & Units 6mo ago  TSH 0.450 - 4.500 uIU/mL 3.690   T4, Total 4.5 - 12.0 ug/dL 7.5   T3 Uptake Ratio 24 - 39 % 25   Free Thyroxine Index 1.2 - 4.9 1.9   Comment: (NOTE)

## 2017-10-19 ENCOUNTER — Other Ambulatory Visit: Payer: Self-pay | Admitting: *Deleted

## 2017-10-19 ENCOUNTER — Other Ambulatory Visit: Payer: Self-pay

## 2017-10-19 ENCOUNTER — Inpatient Hospital Stay: Payer: Medicare Other | Attending: Internal Medicine

## 2017-10-19 DIAGNOSIS — E890 Postprocedural hypothyroidism: Secondary | ICD-10-CM | POA: Insufficient documentation

## 2017-10-19 DIAGNOSIS — D649 Anemia, unspecified: Secondary | ICD-10-CM | POA: Insufficient documentation

## 2017-10-19 DIAGNOSIS — C73 Malignant neoplasm of thyroid gland: Secondary | ICD-10-CM

## 2017-10-19 DIAGNOSIS — Z923 Personal history of irradiation: Secondary | ICD-10-CM | POA: Insufficient documentation

## 2017-10-19 DIAGNOSIS — Z85850 Personal history of malignant neoplasm of thyroid: Secondary | ICD-10-CM | POA: Insufficient documentation

## 2017-10-19 DIAGNOSIS — Z79899 Other long term (current) drug therapy: Secondary | ICD-10-CM | POA: Diagnosis not present

## 2017-10-19 DIAGNOSIS — I10 Essential (primary) hypertension: Secondary | ICD-10-CM | POA: Insufficient documentation

## 2017-10-19 LAB — COMPREHENSIVE METABOLIC PANEL
ALBUMIN: 3.8 g/dL (ref 3.5–5.0)
ALT: 16 U/L (ref 0–44)
AST: 24 U/L (ref 15–41)
Alkaline Phosphatase: 59 U/L (ref 38–126)
Anion gap: 11 (ref 5–15)
BUN: 34 mg/dL — AB (ref 8–23)
CHLORIDE: 105 mmol/L (ref 98–111)
CO2: 22 mmol/L (ref 22–32)
Calcium: 9.1 mg/dL (ref 8.9–10.3)
Creatinine, Ser: 0.97 mg/dL (ref 0.44–1.00)
GFR calc Af Amer: >60 mL/min (ref >60)
GFR calc non Af Amer: 53 mL/min — ABNORMAL LOW (ref >60)
GLUCOSE: 192 mg/dL — AB (ref 70–99)
POTASSIUM: 4.3 mmol/L (ref 3.5–5.1)
SODIUM: 138 mmol/L (ref 135–145)
Total Bilirubin: 0.4 mg/dL (ref 0.3–1.2)
Total Protein: 6.1 g/dL — ABNORMAL LOW (ref 6.5–8.1)

## 2017-10-19 LAB — CBC WITH DIFFERENTIAL/PLATELET
Abs Immature Granulocytes: 0.15 10*3/uL — ABNORMAL HIGH (ref 0.00–0.07)
BASOS ABS: 0 10*3/uL (ref 0.0–0.1)
BASOS PCT: 0 %
EOS ABS: 0 10*3/uL (ref 0.0–0.5)
Eosinophils Relative: 0 %
HCT: 34.5 % — ABNORMAL LOW (ref 36.0–46.0)
Hemoglobin: 10.9 g/dL — ABNORMAL LOW (ref 12.0–15.0)
IMMATURE GRANULOCYTES: 1 %
Lymphocytes Relative: 8 %
Lymphs Abs: 0.9 10*3/uL (ref 0.7–4.0)
MCH: 28.8 pg (ref 26.0–34.0)
MCHC: 31.6 g/dL (ref 30.0–36.0)
MCV: 91.3 fL (ref 80.0–100.0)
Monocytes Absolute: 0.3 10*3/uL (ref 0.1–1.0)
Monocytes Relative: 3 %
NEUTROS ABS: 10.7 10*3/uL — AB (ref 1.7–7.7)
NEUTROS PCT: 88 %
PLATELETS: 212 10*3/uL (ref 150–400)
RBC: 3.78 MIL/uL — ABNORMAL LOW (ref 3.87–5.11)
RDW: 15.1 % (ref 11.5–15.5)
WBC: 12.1 10*3/uL — AB (ref 4.0–10.5)
nRBC: 0 % (ref 0.0–0.2)

## 2017-10-20 LAB — THYROID PANEL WITH TSH
Free Thyroxine Index: 2.4 (ref 1.2–4.9)
T3 Uptake Ratio: 31 % (ref 24–39)
T4, Total: 7.6 ug/dL (ref 4.5–12.0)
TSH: 1.49 u[IU]/mL (ref 0.450–4.500)

## 2017-10-20 LAB — TGAB+THYROGLOBULIN IMA OR RIA

## 2017-10-20 LAB — THYROGLOBULIN BY IMA: Thyroglobulin by IMA: <0.1 ng/mL — ABNORMAL LOW (ref 1.5–38.5)

## 2017-10-25 ENCOUNTER — Encounter: Payer: Self-pay | Admitting: Nurse Practitioner

## 2017-10-25 ENCOUNTER — Inpatient Hospital Stay (HOSPITAL_BASED_OUTPATIENT_CLINIC_OR_DEPARTMENT_OTHER): Payer: Medicare Other | Admitting: Nurse Practitioner

## 2017-10-25 ENCOUNTER — Other Ambulatory Visit: Payer: Self-pay

## 2017-10-25 VITALS — BP 133/65 | HR 73 | Temp 97.6°F | Resp 18

## 2017-10-25 DIAGNOSIS — D649 Anemia, unspecified: Secondary | ICD-10-CM

## 2017-10-25 DIAGNOSIS — Z79899 Other long term (current) drug therapy: Secondary | ICD-10-CM

## 2017-10-25 DIAGNOSIS — E890 Postprocedural hypothyroidism: Secondary | ICD-10-CM

## 2017-10-25 DIAGNOSIS — Z85850 Personal history of malignant neoplasm of thyroid: Secondary | ICD-10-CM | POA: Diagnosis not present

## 2017-10-25 DIAGNOSIS — Z923 Personal history of irradiation: Secondary | ICD-10-CM

## 2017-10-25 DIAGNOSIS — I10 Essential (primary) hypertension: Secondary | ICD-10-CM

## 2017-10-25 DIAGNOSIS — C73 Malignant neoplasm of thyroid gland: Secondary | ICD-10-CM

## 2017-10-25 MED ORDER — LEVOTHYROXINE SODIUM 88 MCG PO TABS: ORAL_TABLET | ORAL | 1 refills | Status: DC

## 2017-10-25 NOTE — Progress Notes (Signed)
[facility] OFFICE PROGRESS NOTE  Patient Care Team: Idelle Crouch, MD as PCP - General (Internal Medicine) Minna Merritts, MD as Consulting Physician (Cardiology) Cammie Sickle, MD as Medical Oncologist (Medical Oncology)  Cancer Staging Thyroid cancer [facility]) Staging form: Thyroid - Papillary or Follicular, AJCC 7th Edition - Clinical stage from 03/22/2014: Stage III (T3, N0, M0) - Signed by Cammie Sickle, MD on 10/22/2015 Staging form: Thyroid - Papillary or Follicular (Under 45 years), AJCC 7th Edition - Pathologic: No stage assigned - Unsigned   Oncology History   1. 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  0.5 cm tumor extending into perithyroid soft tissue status post total thyroidectomy;   One lymph node was negative.  No angiolymphatic invasion seen [Dr.McQueen]; Intermediate risk of recurrence- goal TSH 0.1- 0.5  # Status post radioactive iodine therapy in May of 2016.  # On Synthroid      Thyroid cancer [facility])      INTERVAL HISTORY: [name] 84 y.o. female pleasant patient with above history of stage III papillary thyroid carcinoma who returns to clinic today for follow-up.  Today, she reports stress of being caregiver for her chronically ill husband.  She has had tooth extraction performed with abscess and was previously on antibiotics which she completed 2 days ago.  Denies any recent fever or chills.  Denies any new in the neck or under her arms.  Denies unusual shortness of breath or cough.  Denies any tremors or palpitations.  In interim, she was seen in emergency room on 09/27/2017 for 2-day history of intermittent chest pain, mild.  Cardiac work-up was negative and she was discharged home to follow-up with cardiology which she has not done.  She has not had any additional episodes of symptoms.    Review of Systems  Constitutional: Negative for chills, fever,  malaise/fatigue and weight loss.  HENT: Negative for congestion, ear discharge, ear pain, sinus pain, sore throat and tinnitus.   Eyes: Negative.   Respiratory: Negative.  Negative for cough, sputum production and shortness of breath.   Cardiovascular: Negative for chest pain, palpitations, orthopnea, claudication and leg swelling.  Gastrointestinal: Negative for abdominal pain, blood in stool, constipation, diarrhea, heartburn, nausea and vomiting.  Genitourinary: Negative.   Musculoskeletal: Negative.   Skin: Negative.   Neurological: Negative for dizziness, tingling, weakness and headaches.  Endo/Heme/Allergies: Negative.   Psychiatric/Behavioral: Negative.     PAST MEDICAL HISTORY :  Past Medical History:  Diagnosis Date  . CAD (coronary artery disease)    a. lexiscan 08/2013: nl wall motion, no ischemia, EF 50-55%; b. cardiac cath 05/31/2014: mLAD 90%, dLAD 50%, dLCx 60%, 1st Mrg 80%, pRCA 60% s/p PCI/DES, mRCA 1st lesion 70%, mRCA 2nd 95% s/p PCI/long DES to cover entire mRCA, RPLB 40%;  c. 05/2014 Staged PCI of LAD w/ 2.5 x 15 mm Xience Alpine DES.  . Carotid artery disease ([place])   . Chronic anxiety   . Diverticulosis   . Esophagitis   . GERD (gastroesophageal reflux disease)   . History of colon polyps   . History of echocardiogram    a. 08/2013: normal LVSF, nl RVSF, no valvular regurgitation or stenosis   . Hyperlipidemia   . Hypertension   . Hypothyroidism   . Mild reactive airways disease   . Osteoarthritis   . PONV (postoperative nausea and vomiting)   . Rheumatoid arthritis ([place])   . Stroke [facility])  a. 05/2014 pt c/o garbled speech following cath 5/19. MRI/A 5/26 showed left caudate infarct->conservative mgmt per neuro.  . Thyroid cancer ([place])    a. s/p right sided hemi-thyroidectomy; b. planning for radioactive iodine tx; c. followed by Dr. Marcelene Butte  . Type 2 diabetes mellitus ([place])     PAST SURGICAL HISTORY :   Past Surgical History:  Procedure Laterality Date  .  ABDOMINAL HYSTERECTOMY    . APPENDECTOMY    . AUGMENTATION MAMMAPLASTY    . BACK SURGERY    . BREAST IMPLANT REMOVAL  06/2001  . CARDIAC CATHETERIZATION N/A 06/06/2014   Procedure: Coronary/Graft Atherectomy;  Surgeon: Wellington Hampshire, MD;  Location: [place] CV LAB;  Service: Cardiovascular;  Laterality: N/A;  . CARDIAC CATHETERIZATION N/A 05/31/2014   Procedure: Left Heart Cath;  Surgeon: Minna Merritts, MD;  Location: [place] CV LAB;  Service: Cardiovascular;  Laterality: N/A;  . CARDIAC CATHETERIZATION N/A 05/31/2014   Procedure: Coronary Stent Intervention;  Surgeon: Wellington Hampshire, MD;  Location: [place] CV LAB;  Service: Cardiovascular;  Laterality: N/A;  . CARPAL TUNNEL RELEASE     "I've have a total of 7" (06/06/2014)  . CATARACT EXTRACTION W/ INTRAOCULAR LENS  IMPLANT, BILATERAL Bilateral   . CHOLECYSTECTOMY    . CORONARY ANGIOPLASTY WITH STENT PLACEMENT  05/31/2014; 06/06/2014   "2; 1"  . GANGLION CYST EXCISION Right    AC joint  . LUMBAR LAMINECTOMY  X 6  . MENISCUS REPAIR Bilateral    "2 on the right; 1 on the left"  . ROTATOR CUFF REPAIR     "I think 2 left, 2 right"  . THYROIDECTOMY    . TONSILLECTOMY    . TUBAL LIGATION      FAMILY HISTORY :   Family History  Problem Relation Age of Onset  . Heart disease Sister        stent placed x 3     SOCIAL HISTORY:   Social History   Tobacco Use  . Smoking status: Never Smoker  . Smokeless tobacco: Never Used  Substance Use Topics  . Alcohol use: No  . Drug use: No    ALLERGIES:  is allergic to ciprofloxacin; codeine; metronidazole; sulfa antibiotics; neomycin; neomycin-bacitracin zn-polymyx; bacitracin-polymyxin b; celecoxib; petrolatum-zinc oxide; statins; and vioxx [rofecoxib].  MEDICATIONS:  Current Outpatient Medications  Medication Sig Dispense Refill  . allopurinol (ZYLOPRIM) 100 MG tablet Take 1 tablet (100 mg total) by mouth 2 (two) times daily. 60 tablet 10  . ALPRAZolam (XANAX) 0.25  MG tablet Take 0.25 mg by mouth at bedtime as needed for anxiety or sleep.     . Cholecalciferol 1000 UNITS tablet Take 1,000 Units by mouth daily.     . clopidogrel (PLAVIX) 75 MG tablet Take 1 tablet (75 mg total) by mouth daily. 7 tablet 0  . colchicine (COLCRYS) 0.6 MG tablet Take 1 tablet (0.6 mg total) by mouth daily. 90 tablet 4  . levothyroxine (SYNTHROID, LEVOTHROID) 88 MCG tablet TAKE 1 TABLET(88 MCG) BY MOUTH DAILY 90 tablet 1  . metoprolol succinate (TOPROL XL) 50 MG 24 hr tablet Take 1 tablet (50 mg total) by mouth daily. 90 tablet 3  . pantoprazole (PROTONIX) 40 MG tablet TAKE 1 TABLET BY MOUTH DAILY 30 tablet 8  . ramipril (ALTACE) 10 MG capsule TAKE 1 CAPSULE(10 MG) BY MOUTH DAILY 30 capsule 3  . rosuvastatin (CRESTOR) 5 MG tablet TAKE 1 TABLET DAILY 90 tablet 3  . Thiamine HCl (VITAMIN B-1 PO)  Take by mouth daily.    [place] VITAMIN E PO Take by mouth daily.    . nitroGLYCERIN (NITROSTAT) 0.4 MG SL tablet PLACE 1 TABLET UNDER THE TONGUE EVERY 5 MINUTES AS NEEDED FOR CHEST PAIN (Patient not taking: Reported on 10/25/2017) 25 tablet 2   No current facility-administered medications for this visit.     PHYSICAL EXAMINATION: ECOG PERFORMANCE STATUS: 0 - Asymptomatic  BP 133/65   Pulse 73   Temp 97.6 F (36.4 C) (Tympanic)   Resp 18   There were no vitals filed for this visit.  GENERAL: Well-nourished well-developed; Alert, no distress and comfortable. Alone.   EYES: no pallor or icterus OROPHARYNX: no thrush or ulceration.  NECK: supple; no lymph nodes felt LYMPH: no palpable lymphadenopathy in the cervical or axillary regions LUNGS: clear breath sounds to auscultation bilaterally. No wheeze or crackles HEART/CVS: regular rate & rhythm and no murmurs; No lower extremity edema ABDOMEN: abdomen soft, non-tender and normal bowel sounds.  Musculoskeletal: no cyanosis of digits and no clubbing  PSYCH: alert & oriented x 3 with fluent speech NEURO: no focal motor/sensory  deficits SKIN: no rashes or significant lesions   LABORATORY DATA:  I have reviewed the data as listed CBC Latest Ref Rng & Units 10/19/2017 09/27/2017 04/14/2017  WBC 4.0 - 10.5 K/uL 12.1(H) 5.4 7.4  Hemoglobin 12.0 - 15.0 g/dL 10.9(L) 10.8(L) 10.5(L)  Hematocrit 36.0 - 46.0 % 34.5(L) 31.2(L) 31.3(L)  Platelets 150 - 400 K/uL 212 184 209   CMP Latest Ref Rng & Units 10/19/2017 09/27/2017 04/14/2017  Glucose 70 - 99 mg/dL 192(H) 110(H) 93  BUN 8 - 23 mg/dL 34(H) 29(H) 32(H)  Creatinine 0.44 - 1.00 mg/dL 0.97 0.79 0.91  Sodium 135 - 145 mmol/L 138 139 139  Potassium 3.5 - 5.1 mmol/L 4.3 4.1 4.6  Chloride 98 - 111 mmol/L 105 108 108  CO2 22 - 32 mmol/L _0 Calcium 8.9 - 10.3 mg/dL 9.1 8.9 9.4  Total Protein 6.5 - 8.1 g/dL 6.1(L) - 6.7  Total Bilirubin 0.3 - 1.2 mg/dL 0.4 - 0.6  Alkaline Phos 38 - 126 U/L 59 - 72  AST 15 - 41 U/L 24 - 23  ALT 0 - 44 U/L 16 - 14   Lab Results  Component Value Date   TSH 1.490 10/19/2017    RADIOGRAPHIC STUDIES: I have personally reviewed the radiological images as listed and agreed with the findings in the report. No results found.   ASSESSMENT & PLAN:  No problem-specific Assessment & Plan notes found for this encounter.  1. Thyroid cancer-stage III s/p resection followed by radioactive iodine.  Clinically, no evidence of recurrence.  Thyroglobulin is normal.  2.  Hypothyroidism/TSH suppression-currently on Synthroid 88 mcg.  Given her age, Dr. Rogue Bussing has recommended TSH goal of normal vs TSH for intermediate risk of 0.1-0.5. TSH most recently 1.490; within goal.  Will refill Synthroid today and plan to recheck TSH in 6 months.  3.  Anemia-mild hemoglobin 10.9; chronic. Slightly improved.  Previously, iron studies were negative.  No evidence of micro or macrocytosis.  Discussed that further work-up would indicate bone marrow biopsy to further evaluate chronic anemia which she is reluctant to proceed with at this time.  Discussed that in  setting of mild improvement, could revisit in 6 months.  If worsening anemia would recommend bone marrow biopsy sooner.    6 months- labs (cbc, cmp, thyroid tumor marker, thyroid panel w/ tsh). Follow up with Dr. Rogue Bussing  few days later for reevaluation and discussion of results.  Orders Placed This Encounter  Procedures  . TgAb+Thyroglobulin IMA or RIA    Standing Status:   Future    Standing Expiration Date:   10/26/2018  . Thyroid Panel With TSH    Standing Status:   Future    Standing Expiration Date:   10/26/2018  . CBC with Differential/Platelet    Standing Status:   Future    Standing Expiration Date:   10/26/2018  . Comprehensive metabolic panel    Standing Status:   Future    Standing Expiration Date:   10/26/2018   All questions were answered. The patient knows to call the clinic with any problems, questions or concerns.   Beckey Rutter, DNP, AGNP-C [facility] (work cell) [phone] (office)  CC: Dr. Rogue Bussing

## 2017-11-04 ENCOUNTER — Ambulatory Visit (INDEPENDENT_AMBULATORY_CARE_PROVIDER_SITE_OTHER): Payer: Medicare Other | Admitting: Orthopedic Surgery

## 2017-11-10 ENCOUNTER — Other Ambulatory Visit: Payer: Self-pay | Admitting: Cardiovascular Disease

## 2017-11-11 NOTE — Progress Notes (Signed)
Cardiology Office Note  Date:  11/12/2017   ID:  [name], [name] Jun 03, 1933, MRN [id number]  PCP:  Idelle Crouch, MD   Chief Complaint  Patient presents with  . other    Pt. was at [facility] ER with chest pain in Sep. 2019. Pt. still c/o chest pains.     HPI:  [name] is a 84 y/o female with PMH of  coronary artery disease,  cath May 2016 revealing severe LAD and RCA disease,  Percutaneous intervention and drug-eluting stent placement was performed to  the right coronary artery.The LAD was also felt to be amenable for PCI but it was felt that she would require a Rotablator. 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 She watches her weight daily and takes Lasix for any increase in her weight No regular exercise,  Mild chronic shortness of breath on exertion  EKG personally reviewed by myself on todays visit Shows normal sinus rhythm rate 76 bpm no significant ST or T wave changes  Other past medical history reviewed Prior falls x 3 Mechanical, tripped on a mat, often stepping in the  wrong place Very deconditioned, terrible gait instability, walks with a cane  Previous problems with gout, uric acid 8.1 On meloxican prn for pain  episode of garbled speech after PCI and MRI/A of the brain was performed and showed an acute, small, nonhemorrhagic infarct of the left caudate with moderate small vessel disease. She did not have any acute neurologic deficits.  Carotid ultrasound was performed and showed bilateral 1-39% internal carotid artery stenosis.   Thyroid nodule found, status post ablation, currently on thyroid supplementation This was performed March 2016  PMH:   has a past medical history of CAD (coronary artery disease), Carotid artery disease ([place]), Chronic anxiety, Diverticulosis, Esophagitis, GERD (gastroesophageal reflux disease), History of colon polyps, History of echocardiogram, Hyperlipidemia, Hypertension, Hypothyroidism, Mild reactive airways disease, Osteoarthritis, PONV (postoperative nausea and vomiting), Rheumatoid arthritis ([place]), Stroke ([place]), Thyroid cancer ([place]), and Type 2 diabetes mellitus ([place]).  PSH:    Past Surgical History:  Procedure Laterality Date  . ABDOMINAL HYSTERECTOMY    . APPENDECTOMY    . AUGMENTATION MAMMAPLASTY    . BACK SURGERY    . BREAST IMPLANT REMOVAL  06/2001  . CARDIAC CATHETERIZATION N/A 06/06/2014   Procedure: Coronary/Graft Atherectomy;  Surgeon: Wellington Hampshire, MD;  Location: [place] [facility];  Service: Cardiovascular;  Laterality: N/A;  . CARDIAC CATHETERIZATION N/A 05/31/2014   Procedure: Left Heart Cath;  Surgeon: Minna Merritts, MD;  Location: [place] [facility];  Service: Cardiovascular;  Laterality: N/A;  . CARDIAC CATHETERIZATION N/A 05/31/2014   Procedure: Coronary Stent Intervention;  Surgeon: Wellington Hampshire, MD;  Location: [place] [facility];  Service: Cardiovascular;  Laterality: N/A;  . CARPAL TUNNEL RELEASE     "I've have a total of 7" (06/06/2014)  . CATARACT EXTRACTION W/ INTRAOCULAR LENS   IMPLANT, BILATERAL Bilateral   . CHOLECYSTECTOMY    . CORONARY ANGIOPLASTY WITH STENT PLACEMENT  05/31/2014; 06/06/2014   "2; 1"  . GANGLION CYST EXCISION Right    AC joint  . LUMBAR LAMINECTOMY  X 6  . MENISCUS REPAIR Bilateral    "2 on the right; 1 on the left"  . ROTATOR CUFF REPAIR     "I think 2 left, 2 right"  . THYROIDECTOMY    . TONSILLECTOMY    . TUBAL LIGATION      Current Outpatient Medications  Medication Sig Dispense Refill  . allopurinol (ZYLOPRIM) 100 MG tablet Take 1 tablet (100 mg total) by mouth 2 (two) times daily. 60 tablet 10  . ALPRAZolam (XANAX) 0.25 MG tablet Take 0.25 mg by mouth at bedtime as needed for anxiety or sleep.     . Cholecalciferol 1000 UNITS tablet Take 1,000 Units by mouth daily.     . clopidogrel (PLAVIX) 75 MG tablet Take 1 tablet (75 mg total) by mouth daily. 7 tablet 0  . colchicine (COLCRYS) 0.6 MG tablet Take 1 tablet (0.6 mg total) by mouth daily. 90 tablet 4  . levothyroxine (SYNTHROID, LEVOTHROID) 88 MCG tablet TAKE 1 TABLET(88 MCG) BY MOUTH DAILY 90 tablet 1  . metoprolol succinate (TOPROL XL) 50 MG 24 hr tablet Take 1 tablet (50 mg total) by mouth daily. 90 tablet 3  . nitroGLYCERIN (NITROSTAT) 0.4 MG SL tablet PLACE 1 TABLET UNDER THE TONGUE EVERY 5 MINUTES AS NEEDED FOR CHEST PAIN 25 tablet 2  . nitroGLYCERIN (NITROSTAT) 0.4 MG SL tablet PLACE 1 TABLET UNDER THE TONGUE EVERY 5 MINUTES AS NEEDED FOR CHEST PAIN 25 tablet 0  . pantoprazole (PROTONIX) 40 MG tablet TAKE 1 TABLET BY MOUTH DAILY 30 tablet 8  . ramipril (ALTACE) 10 MG capsule TAKE 1 CAPSULE(10 MG) BY MOUTH DAILY 30 capsule 3  . rosuvastatin (CRESTOR) 5 MG tablet TAKE 1 TABLET DAILY 90 tablet 3  . Thiamine HCl (VITAMIN B-1 PO) Take by mouth daily.    Marland Kitchen VITAMIN E PO Take by mouth daily.     No current facility-administered medications for this visit.      Allergies:   Ciprofloxacin; Codeine; Metronidazole; Other; Sulfa antibiotics; Neomycin; Neomycin-bacitracin zn-polymyx;  Bacitracin-polymyxin b; Celecoxib; Petrolatum-zinc oxide; Statins; and Vioxx [rofecoxib]   Social History:  The patient  reports that she has never smoked. She has never used smokeless tobacco. She reports that she does not drink alcohol or use drugs.   Family History:   family history includes Heart disease in her sister.    Review of Systems: Review of Systems  Constitutional: Negative.   Respiratory: Negative.   Cardiovascular: Negative.        Rare episodes atypical chest pain  Gastrointestinal: Negative.   Musculoskeletal: Positive for falls.  Neurological: Negative.   Psychiatric/Behavioral: Negative.   All other systems reviewed and are negative.    PHYSICAL EXAM: VS:  BP (!) 150/60 (BP Location: Left Arm, Patient Position: Sitting, Cuff Size: Normal)   Pulse 76   Ht 5\' 2"  (1.575 m)   Wt 137 lb 8 oz (62.4 kg)   BMI 25.15 kg/m  ,  BMI Body mass index is 25.15 kg/m. Constitutional:  oriented to person, place, and time. No distress.  HENT:  Head: Grossly normal Eyes:  no discharge. No scleral icterus.  Neck: No JVD, no carotid bruits  Cardiovascular: Regular rate and rhythm, no murmurs appreciated Pulmonary/Chest: Clear to auscultation bilaterally, no wheezes or rails Abdominal: Soft.  no distension.  no tenderness.  Musculoskeletal: Normal range of motion Neurological:  normal muscle tone. Coordination normal. No atrophy Skin: Skin warm and dry Psychiatric: normal affect, pleasant   Recent Labs: 10/19/2017: ALT 16; BUN 34; Creatinine, Ser 0.97; Hemoglobin 10.9; Platelets 212; Potassium 4.3; Sodium 138; TSH 1.490    Lipid Panel Lab Results  Component Value Date   CHOL 151 08/14/2014   HDL 42 08/14/2014   LDLCALC 67 08/14/2014   TRIG 209 (H) 08/14/2014      Wt Readings from Last 3 Encounters:  11/12/17 137 lb 8 oz (62.4 kg)  09/27/17 130 lb (59 kg)  04/19/17 127 lb (57.6 kg)       ASSESSMENT AND PLAN:  Near syncope No recent episodes of near  syncope No further work-up  Coronary artery disease involving native coronary artery of native heart without angina pectoris Recent visit to the emergency room for chest pain Hospital records reviewed, discussed in detail Possibly secondary to stress as detailed in the note above Recommend she call our office for further episodes of chest pain particular associated with exertion  Mixed hyperlipidemia Continue Crestor 5 mg daily Discussed guidelines with her, we will add Zetia 10 mg daily  Essential hypertension Blood pressure mildly elevated today, somewhat anxious Recommend she monitor blood pressure at home  Falls Several prior falls, none recently Recommended regular walking program through the winter to maintain leg strength  Type 2 diabetes mellitus with other circulatory complication, without long-term current use of insulin (HCC) Good numbers, hemoglobin A1c less than 6  Anemia chronic issue Monitored by primary care Recommend she try to add some iron in her diet, HCT 32   Total encounter time more than 25 minutes  Greater than 50% was spent in counseling and coordination of care with the patient   Disposition:   F/U  12 months   Orders Placed This Encounter  Procedures  . EKG 12-Lead     Signed, Esmond Plants, M.D., Ph.D. 11/12/2017  [place], [place] \

## 2017-11-12 ENCOUNTER — Encounter: Payer: Self-pay | Admitting: Cardiovascular Disease

## 2017-11-12 ENCOUNTER — Ambulatory Visit (INDEPENDENT_AMBULATORY_CARE_PROVIDER_SITE_OTHER): Payer: Medicare Other | Admitting: Cardiovascular Disease

## 2017-11-12 ENCOUNTER — Encounter

## 2017-11-12 VITALS — BP 150/60 | HR 76 | Ht 62.0 in | Wt 137.5 lb

## 2017-11-12 DIAGNOSIS — E782 Mixed hyperlipidemia: Secondary | ICD-10-CM | POA: Diagnosis not present

## 2017-11-12 DIAGNOSIS — E1159 Type 2 diabetes mellitus with other circulatory complications: Secondary | ICD-10-CM

## 2017-11-12 DIAGNOSIS — I10 Essential (primary) hypertension: Secondary | ICD-10-CM

## 2017-11-12 DIAGNOSIS — I25118 Atherosclerotic heart disease of native coronary artery with other forms of angina pectoris: Secondary | ICD-10-CM

## 2017-11-12 DIAGNOSIS — I6523 Occlusion and stenosis of bilateral carotid arteries: Secondary | ICD-10-CM

## 2017-11-12 DIAGNOSIS — R600 Localized edema: Secondary | ICD-10-CM

## 2017-11-12 MED ORDER — EZETIMIBE 10 MG PO TABS: 10.0000 mg | ORAL_TABLET | Freq: Every day | ORAL | 3 refills | Status: DC

## 2017-11-12 NOTE — Patient Instructions (Signed)
Medication Instructions:   Please start zetia one a day, for cholesterol  If you need a refill on your cardiac medications before your next appointment, please call your pharmacy.   Lab work: No new labs needed   If you have labs (blood work) drawn today and your tests are completely normal, you will receive your results only by: . MyChart Message (if you have MyChart) OR . A paper copy in the mail If you have any lab test that is abnormal or we need to change your treatment, we will call you to review the results.   Testing/Procedures: No new testing needed   Follow-Up: At [facility], you and your health needs are our priority.  As part of our continuing mission to provide you with exceptional heart care, we have created designated Provider Care Teams.  These Care Teams include your primary Cardiologist (physician) and Advanced Practice Providers (APPs -  Physician Assistants and Nurse Practitioners) who all work together to provide you with the care you need, when you need it.  . You will need a follow up appointment in 12 months .   Please call our office 2 months in advance to schedule this appointment.    . Providers on your designated Care Team:   . Christopher Berge, NP . Ryan Dunn, PA-C . Jacquelyn Visser, PA-C  Any Other Special Instructions Will Be Listed Below (If Applicable).  For educational health videos Log in to : [email or web address] Or : [email or web address], password : [email or web address]   

## 2017-12-05 ENCOUNTER — Other Ambulatory Visit: Payer: Self-pay | Admitting: Cardiovascular Disease

## 2017-12-08 ENCOUNTER — Other Ambulatory Visit: Payer: Self-pay

## 2017-12-08 ENCOUNTER — Emergency Department
Admission: EM | Admit: 2017-12-08 | Discharge: 2017-12-08 | Disposition: A | Discharge status: Home / Self Care | Payer: Medicare Other | Attending: Student in an Organized Health Care Education/Training Program | Admitting: Student in an Organized Health Care Education/Training Program

## 2017-12-08 ENCOUNTER — Emergency Department: Payer: Medicare Other

## 2017-12-08 DIAGNOSIS — W010XXA Fall on same level from slipping, tripping and stumbling without subsequent striking against object, initial encounter: Secondary | ICD-10-CM | POA: Diagnosis not present

## 2017-12-08 DIAGNOSIS — E039 Hypothyroidism, unspecified: Secondary | ICD-10-CM | POA: Insufficient documentation

## 2017-12-08 DIAGNOSIS — Z7902 Long term (current) use of antithrombotics/antiplatelets: Secondary | ICD-10-CM | POA: Diagnosis not present

## 2017-12-08 DIAGNOSIS — E119 Type 2 diabetes mellitus without complications: Secondary | ICD-10-CM | POA: Insufficient documentation

## 2017-12-08 DIAGNOSIS — Y92480 Sidewalk as the place of occurrence of the external cause: Secondary | ICD-10-CM | POA: Insufficient documentation

## 2017-12-08 DIAGNOSIS — Y9301 Activity, walking, marching and hiking: Secondary | ICD-10-CM | POA: Insufficient documentation

## 2017-12-08 DIAGNOSIS — Y999 Unspecified external cause status: Secondary | ICD-10-CM | POA: Insufficient documentation

## 2017-12-08 DIAGNOSIS — Z8673 Personal history of transient ischemic attack (TIA), and cerebral infarction without residual deficits: Secondary | ICD-10-CM | POA: Insufficient documentation

## 2017-12-08 DIAGNOSIS — I25110 Atherosclerotic heart disease of native coronary artery with unstable angina pectoris: Secondary | ICD-10-CM | POA: Insufficient documentation

## 2017-12-08 DIAGNOSIS — M79602 Pain in left arm: Secondary | ICD-10-CM | POA: Insufficient documentation

## 2017-12-08 DIAGNOSIS — S20212A Contusion of left front wall of thorax, initial encounter: Secondary | ICD-10-CM | POA: Diagnosis not present

## 2017-12-08 DIAGNOSIS — W19XXXA Unspecified fall, initial encounter: Secondary | ICD-10-CM

## 2017-12-08 DIAGNOSIS — Z79899 Other long term (current) drug therapy: Secondary | ICD-10-CM | POA: Insufficient documentation

## 2017-12-08 DIAGNOSIS — I10 Essential (primary) hypertension: Secondary | ICD-10-CM | POA: Diagnosis not present

## 2017-12-08 DIAGNOSIS — S299XXA Unspecified injury of thorax, initial encounter: Secondary | ICD-10-CM | POA: Diagnosis present

## 2017-12-08 DIAGNOSIS — Z85850 Personal history of malignant neoplasm of thyroid: Secondary | ICD-10-CM | POA: Diagnosis not present

## 2017-12-08 MED ORDER — TRAMADOL-ACETAMINOPHEN 37.5-325 MG PO TABS: 1.0000 | ORAL_TABLET | Freq: Three times a day (TID) | ORAL | 0 refills | Status: DC | PRN

## 2017-12-08 NOTE — ED Triage Notes (Signed)
Pt arrives to ed via POV, ambulatory to triage. Pt reports falling last night on sidewalk. Pt c/o left shoulder/arm pain, right thumb, left knee pain. Pt denies hitting head or any LOC. A&o x 4, nad noted at hie time

## 2017-12-08 NOTE — ED Provider Notes (Signed)
[facility] Emergency Department Provider Note   ____________________________________________   First MD Initiated Contact with Patient 12/08/17 1233     (approximate)  I have reviewed the triage vital signs and the nursing notes.   HISTORY  Chief Complaint Fall    HPI [name] is a 84 y.o. female patient complain of left shoulder and left rib pain secondary to a fall last night.  Patient she tripped and landed on the sidewalk.  Patient denies loss of consciousness or other head injuries.  Patient is able to ambulate with mild discomfort.  Patient state decreased range of motion with abduction overhead reaching of the left shoulder.  Patient increased pain in the left lateral chest wall with deep inspirations.  Patient rates the pain as a 6/10.  Patient described the pain is "aching".  Patient is taking Tylenol for discomfort.  Past Medical History:  Diagnosis Date  . CAD (coronary artery disease)    a. lexiscan 08/2013: nl wall motion, no ischemia, EF 50-55%; b. cardiac cath 05/31/2014: mLAD 90%, dLAD 50%, dLCx 60%, 1st Mrg 80%, pRCA 60% s/p PCI/DES, mRCA 1st lesion 70%, mRCA 2nd 95% s/p PCI/long DES to cover entire mRCA, RPLB 40%;  c. 05/2014 Staged PCI of LAD w/ 2.5 x 15 mm Xience [place] DES.  . Carotid artery disease ([place])   . Chronic anxiety   . Diverticulosis   . Esophagitis   . GERD (gastroesophageal reflux disease)   . History of colon polyps   . History of echocardiogram    a. 08/2013: normal LVSF, nl RVSF, no valvular regurgitation or stenosis   . Hyperlipidemia   . Hypertension   . Hypothyroidism   . Mild reactive airways disease   . Osteoarthritis   . PONV (postoperative nausea and vomiting)   . Rheumatoid arthritis ([place])   . Stroke [facility])    a. 05/2014 pt c/o garbled speech following cath 5/19. MRI/A 5/26 showed left caudate infarct->conservative mgmt per neuro.  . Thyroid cancer ([place])    a. s/p right sided hemi-thyroidectomy; b.  planning for radioactive iodine tx; c. followed by Dr. Marcelene [place]  . Type 2 diabetes mellitus [facility])     Patient Active Problem List   Diagnosis Date Noted  . Achilles tendon contracture, bilateral 06/29/2017  . Acute bilateral low back pain without sciatica 03/17/2016  . Idiopathic chronic gout of multiple sites without tophus 03/17/2016  . Idiopathic gout of multiple sites 11/27/2015  . Cellulitis 05/15/2015  . Bilateral leg edema 02/08/2015  . Chronic anxiety 11/22/2014  . Cerebrovascular accident (CVA) ([place]) 07/25/2014  . Arterial vascular disease 07/25/2014  . Swelling of right lower extremity 06/22/2014  . Diabetes mellitus ([name])   . Stroke ([place]) 06/08/2014  . Anemia 06/07/2014  . Unstable angina ([place]) 06/06/2014  . Carotid artery disease ([place])   . Atherosclerosis of native coronary artery with stable angina pectoris ([place])   . Hyperlipidemia   . Essential hypertension   . Angina pectoris ([place]) 05/31/2014  . SOB (shortness of breath) 04/11/2014  . Palpitations 04/11/2014  . Other fatigue 04/11/2014  . Thyroid cancer ([place]) 04/11/2014    Past Surgical History:  Procedure Laterality Date  . ABDOMINAL HYSTERECTOMY    . APPENDECTOMY    . AUGMENTATION MAMMAPLASTY    . BACK SURGERY    . BREAST IMPLANT REMOVAL  06/2001  . CARDIAC CATHETERIZATION N/A 06/06/2014   Procedure: Coronary/Graft Atherectomy;  Surgeon: Wellington Hampshire, MD;  Location: [place] [facility];  Service: Cardiovascular;  Laterality: N/A;  . CARDIAC CATHETERIZATION N/A 05/31/2014   Procedure: Left Heart Cath;  Surgeon: Minna Merritts, MD;  Location: [place] [facility];  Service: Cardiovascular;  Laterality: N/A;  . CARDIAC CATHETERIZATION N/A 05/31/2014   Procedure: Coronary Stent Intervention;  Surgeon: Wellington Hampshire, MD;  Location: [place] [facility];  Service: Cardiovascular;  Laterality: N/A;  . CARPAL TUNNEL RELEASE     "I've have a total of 7" (06/06/2014)  . CATARACT EXTRACTION W/ INTRAOCULAR LENS   IMPLANT, BILATERAL Bilateral   . CHOLECYSTECTOMY    . CORONARY ANGIOPLASTY WITH STENT PLACEMENT  05/31/2014; 06/06/2014   "2; 1"  . GANGLION CYST EXCISION Right    AC joint  . LUMBAR LAMINECTOMY  X 6  . MENISCUS REPAIR Bilateral    "2 on the right; 1 on the left"  . ROTATOR CUFF REPAIR     "I think 2 left, 2 right"  . THYROIDECTOMY    . TONSILLECTOMY    . TUBAL LIGATION      Prior to Admission medications   Medication Sig Start Date End Date Taking? Authorizing Provider  allopurinol (ZYLOPRIM) 100 MG tablet Take 1 tablet (100 mg total) by mouth 2 (two) times daily. 12/29/16   Newt Minion, MD  ALPRAZolam Duanne Moron) 0.25 MG tablet Take 0.25 mg by mouth at bedtime as needed for anxiety or sleep.  08/23/13   [provider]  Cholecalciferol 1000 UNITS tablet Take 1,000 Units by mouth daily.     [provider]  clopidogrel (PLAVIX) 75 MG tablet Take 1 tablet (75 mg total) by mouth daily. 09/23/17   Minna Merritts, MD  colchicine (COLCRYS) 0.6 MG tablet Take 1 tablet (0.6 mg total) by mouth daily. 06/29/17   Newt Minion, MD  ezetimibe (ZETIA) 10 MG tablet Take 1 tablet (10 mg total) by mouth daily. 11/12/17   Minna Merritts, MD  levothyroxine (SYNTHROID, LEVOTHROID) 88 MCG tablet TAKE 1 TABLET(88 MCG) BY MOUTH DAILY 10/25/17   Verlon Au, NP  metoprolol succinate (TOPROL XL) 50 MG 24 hr tablet Take 1 tablet (50 mg total) by mouth daily. 05/13/15   Minna Merritts, MD  nitroGLYCERIN (NITROSTAT) 0.4 MG SL tablet PLACE 1 TABLET UNDER THE TONGUE EVERY 5 MINUTES AS NEEDED FOR CHEST PAIN 10/01/15   Minna Merritts, MD  nitroGLYCERIN (NITROSTAT) 0.4 MG SL tablet PLACE 1 TABLET UNDER THE TONGUE EVERY 5 MINUTES AS NEEDED FOR CHEST PAIN 12/06/17   Minna Merritts, MD  pantoprazole (PROTONIX) 40 MG tablet TAKE 1 TABLET BY MOUTH DAILY 07/22/17   Minna Merritts, MD  ramipril (ALTACE) 10 MG capsule TAKE 1 CAPSULE(10 MG) BY MOUTH DAILY 05/13/15   Minna Merritts, MD    rosuvastatin (CRESTOR) 5 MG tablet TAKE 1 TABLET DAILY 05/05/17   Minna Merritts, MD  Thiamine HCl (VITAMIN B-1 PO) Take by mouth daily.    [provider]  traMADol-acetaminophen (ULTRACET) 37.5-325 MG tablet Take 1 tablet by mouth every 8 (eight) hours as needed. 12/08/17   Sable Feil, PA-C  VITAMIN E PO Take by mouth daily.    [provider]    Allergies Ciprofloxacin; Codeine; Metronidazole; Other; Sulfa antibiotics; Neomycin; Neomycin-bacitracin zn-polymyx; Bacitracin-polymyxin b; Celecoxib; Petrolatum-zinc oxide; Statins; and Vioxx [rofecoxib]  Family History  Problem Relation Age of Onset  . Heart disease Sister        stent placed x 3     Social History Social  History   Tobacco Use  . Smoking status: Never Smoker  . Smokeless tobacco: Never Used  Substance Use Topics  . Alcohol use: No  . Drug use: No    Review of Systems  Constitutional: No fever/chills Eyes: No visual changes. ENT: No sore throat. Cardiovascular: Denies chest pain. Respiratory: Denies shortness of breath. Gastrointestinal: No abdominal pain.  No nausea, no vomiting.  No diarrhea.  No constipation. Genitourinary: Negative for dysuria. Musculoskeletal: Negative for back pain. Skin: Negative for rash. Neurological: Negative for headaches, focal weakness or numbness. Psychiatric:Anxiety. Endocrine:Hyperlipidemia, hypertension, and hyperthyroidism. Hematological/Lymphatic: Allergic/Immunilogical: See extensive medication list. ____________________________________________   PHYSICAL EXAM:  VITAL SIGNS: ED Triage Vitals  Enc Vitals Group     BP 12/08/17 1226 (!) 160/62     Pulse Rate 12/08/17 1226 76     Resp 12/08/17 1226 18     Temp 12/08/17 1226 98 F (36.7 C)     Temp Source 12/08/17 1226 Oral     SpO2 12/08/17 1226 99 %     Weight 12/08/17 1228 135 lb (61.2 kg)     Height 12/08/17 1228 5\' 2"  (1.575 m)     Head Circumference --      Peak Flow --       Pain Score 12/08/17 1227 6     Pain Loc --      Pain Edu? --      Excl. in [place]? --     Constitutional: Alert and oriented. Well appearing and in no acute distress. Eyes: Conjunctivae are normal. PERRL. EOMI. Head: Atraumatic. Nose: No congestion/rhinnorhea. Mouth/Throat: Mucous membranes are moist.  Oropharynx non-erythematous. Neck: No stridor.  No cervical spine tenderness to palpation. Hematological/Lymphatic/Immunilogical: No cervical lymphadenopathy. Cardiovascular: Normal rate, regular rhythm. Grossly normal heart sounds.  Good peripheral circulation.  Elevated blood pressure.   Respiratory: Normal respiratory effort.  Mild left-sided splinting.  Lungs CTAB. Gastrointestinal: Soft and nontender. No distention. No abdominal bruits. No CVA tenderness. Musculoskeletal: No obvious deformity to the left upper extremity.  Decreased range of motion with extension and abduction.  Mild guarding palpation of the proximal humerus. Neurologic:  Normal speech and language. No gross focal neurologic deficits are appreciated. No gait instability. Skin:  Skin is warm, dry and intact. No rash noted. Psychiatric: Mood and affect are normal. Speech and behavior are normal.  ____________________________________________   LABS (all labs ordered are listed, but only abnormal results are displayed)  Labs Reviewed - No data to display ____________________________________________  EKG   ____________________________________________  RADIOLOGY  ED MD interpretation:  Official radiology report(s): Dg Ribs Unilateral W/chest Left  Result Date: 12/08/2017 CLINICAL DATA:  [place] last night onto LEFT side, LEFT shoulder and LEFT anterior rib pain, initial encounter, history rotator cuff repair EXAM: LEFT RIBS AND CHEST - 3+ VIEW COMPARISON:  Chest radiograph 09/27/2017 FINDINGS: Upper normal heart size. Mediastinal contours and pulmonary vascularity normal. Atherosclerotic calcification aorta. Lungs  clear. No pulmonary infiltrate, pleural effusion or pneumothorax. BB placed at site of symptoms lower anterior LEFT chest. Bones appear slightly demineralized. No rib fracture or bone destruction. IMPRESSION: No acute abnormalities. Electronically Signed   By: Lavonia Dana M.D.   On: 12/08/2017 13:15   Dg Shoulder Left  Result Date: 12/08/2017 CLINICAL DATA:  [place] last night onto LEFT side, LEFT shoulder and LEFT anterior rib pain, initial encounter, history rotator cuff repair EXAM: LEFT SHOULDER - 2+ VIEW COMPARISON:  None FINDINGS: Osseous demineralization. Degenerative changes LEFT AC joint. Anchors present at greater tuberosity  from prior rotator cuff repair. Advanced LEFT glenohumeral degenerative changes. No acute fracture, dislocation, or bone destruction. Visualized upper LEFT ribs intact. IMPRESSION: Degenerative changes of LEFT acromioclavicular and glenohumeral joints. Osseous demineralization. No acute abnormalities. Electronically Signed   By: Lavonia Dana M.D.   On: 12/08/2017 13:14    ____________________________________________   PROCEDURES  Procedure(s) performed:   Procedures  Critical Care performed:   ____________________________________________   INITIAL IMPRESSION / ASSESSMENT AND PLAN / ED COURSE  As part of my medical decision making, I reviewed the following data within the [place]    Right shoulder left rib pain secondary to fall.  Discussed x-ray findings with patient.  Patient given discharge care instruction advised take medication as needed for pain.  Patient advised follow-up PCP.      ____________________________________________   FINAL CLINICAL IMPRESSION(S) / ED DIAGNOSES  Final diagnoses:  Musculoskeletal pain of left upper extremity  Contusion of rib on left side, initial encounter  Fall, initial encounter     ED Discharge Orders         Ordered    traMADol-acetaminophen (ULTRACET) 37.5-325 MG tablet  Every 8 hours  PRN     12/08/17 1327           Note:  This document was prepared using Dragon voice recognition software and may include unintentional dictation errors.    Sable Feil, PA-C 12/08/17 1331    Merlyn Lot, MD 12/08/17 [phone]

## 2017-12-10 ENCOUNTER — Other Ambulatory Visit (INDEPENDENT_AMBULATORY_CARE_PROVIDER_SITE_OTHER): Payer: Self-pay | Admitting: Orthopedic Surgery

## 2017-12-13 NOTE — Telephone Encounter (Signed)
Pt's last Uric acid was 1 year ago last office visit in June do you wish to refill?

## 2017-12-14 ENCOUNTER — Encounter (INDEPENDENT_AMBULATORY_CARE_PROVIDER_SITE_OTHER): Payer: Self-pay | Admitting: Orthopedic Surgery

## 2017-12-14 ENCOUNTER — Ambulatory Visit (INDEPENDENT_AMBULATORY_CARE_PROVIDER_SITE_OTHER): Payer: Medicare Other | Admitting: Physician Assistant

## 2017-12-14 VITALS — Ht 62.0 in | Wt 135.0 lb

## 2017-12-14 DIAGNOSIS — Z9181 History of falling: Secondary | ICD-10-CM

## 2017-12-14 DIAGNOSIS — M1A09X0 Idiopathic chronic gout, multiple sites, without tophus (tophi): Secondary | ICD-10-CM | POA: Diagnosis not present

## 2017-12-14 MED ORDER — COLCHICINE 0.6 MG PO CAPS: 0.6000 mg | ORAL_CAPSULE | Freq: Every day | ORAL | 4 refills | Status: DC

## 2017-12-14 NOTE — Progress Notes (Signed)
Office Visit Note   Patient: [name]           Date of Birth: January 09, 1934           MRN: [id number] Visit Date: 12/14/2017              Requested by: Idelle Crouch, MD [facility] [place], [place] [address] PCP: Idelle Crouch, MD  Chief Complaint  Patient presents with  . Left Shoulder - Pain    A/p fall [facility] ER 11/27      HPI: The patient is 84 year old female for follow-up of gout she has multiple joint involvement associated with her gout but recently no gouty attacks.  She reports she has not had a gout attack for at least 1 month.  She is on allopurinol 100 mg p.o. twice daily.  She reports that she is nearly out of colchicine and request a refill for this to cover when she does have a gout attack.  Her gout is usually affects primarily her feet. She also recently had a fall and was seen in the emergency room and had a chest x-ray done and this did not show any acute fractures or other injuries.  She did have some questions concerning the x-ray report and we reviewed the results of this to the patient's satisfaction.  Assessment & Plan: Visit Diagnoses:  1. Idiopathic chronic gout of multiple sites without tophus   2. History of recent fall     Plan: She should continue on allopurinol 100 mg p.o. by twice daily.  Refill for her colchicine was sent in as requested.  We also attempted to draw a uric acid level here in the office were unable to obtain and she will have this done elsewhere and we will follow-up concerning the results at this.  She will follow-up here in 3 months or sooner should she have difficulties in the interim.  Follow-Up Instructions: Return in about 3 months (around 03/15/2018).   Ortho Exam  Patient is alert, oriented, no adenopathy, well-dressed, normal affect, normal respiratory effort. The patient ambulates with a cane.  Her feet are nontender to palpation today.  Her ankle range of motion appears to be right  around neutral.  She has good pedal pulses.  Imaging: No results found. No images are attached to the encounter.  Labs: Lab Results  Component Value Date   ESRSEDRATE 21 10/31/2013   LABURIC 3.9 12/29/2016   LABURIC 5.2 06/22/2016   LABURIC 4.4 03/17/2016   REPTSTATUS 03/20/2016 FINAL 03/18/2016   REPTSTATUS 04/08/2016 FINAL 03/18/2016   CULT  03/18/2016    MODERATE STAPHYLOCOCCUS SPECIES (COAGULASE NEGATIVE) CALL MICROBIOLOGY LAB IF SENSITIVITIES ARE REQUIRED. Performed at [facility], [place] [address], [place], [place] [address]    CULT  03/18/2016    NO FUNGUS ISOLATED AFTER 21 DAYS Performed at [facility], [place] [address] [place], [place] [address]      Lab Results  Component Value Date   ALBUMIN 3.8 10/19/2017   ALBUMIN 4.0 04/14/2017   ALBUMIN 3.9 10/12/2016   LABURIC 3.9 12/29/2016   LABURIC 5.2 06/22/2016   LABURIC 4.4 03/17/2016    Body mass index is 24.69 kg/m.  Orders:  No orders of the defined types were placed in this encounter.  Meds ordered this encounter  Medications  . Colchicine (MITIGARE) 0.6 MG CAPS    Sig: Take 0.6 mg by mouth daily.    Dispense:  30  capsule    Refill:  4     Procedures: No procedures performed  Clinical Data: No additional findings.  ROS:  All other systems negative, except as noted in the HPI. Review of Systems  Objective: Vital Signs: Ht 5\' 2"  (1.575 m)   Wt 135 lb (61.2 kg)   BMI 24.69 kg/m   Specialty Comments:  No specialty comments available.  PMFS History: Patient Active Problem List   Diagnosis Date Noted  . Achilles tendon contracture, bilateral 06/29/2017  . Acute bilateral low back pain without sciatica 03/17/2016  . Idiopathic chronic gout of multiple sites without tophus 03/17/2016  . Idiopathic gout of multiple sites 11/27/2015  . Cellulitis 05/15/2015  . Bilateral leg edema 02/08/2015  . Chronic anxiety 11/22/2014  . Cerebrovascular accident (CVA) ([place]) 07/25/2014  .  Arterial vascular disease 07/25/2014  . Swelling of right lower extremity 06/22/2014  . Diabetes mellitus ([place])   . Stroke ([place]) 06/08/2014  . Anemia 06/07/2014  . Unstable angina ([place]) 06/06/2014  . Carotid artery disease ([place])   . Atherosclerosis of native coronary artery with stable angina pectoris ([place])   . Hyperlipidemia   . Essential hypertension   . Angina pectoris ([place]) 05/31/2014  . SOB (shortness of breath) 04/11/2014  . Palpitations 04/11/2014  . Other fatigue 04/11/2014  . Thyroid cancer ([place]) 04/11/2014   Past Medical History:  Diagnosis Date  . CAD (coronary artery disease)    a. lexiscan 08/2013: nl wall motion, no ischemia, EF 50-55%; b. cardiac cath 05/31/2014: mLAD 90%, dLAD 50%, dLCx 60%, 1st Mrg 80%, pRCA 60% s/p PCI/DES, mRCA 1st lesion 70%, mRCA 2nd 95% s/p PCI/long DES to cover entire mRCA, RPLB 40%;  c. 05/2014 Staged PCI of LAD w/ 2.5 x 15 mm Xience Alpine DES.  . Carotid artery disease ([place])   . Chronic anxiety   . Diverticulosis   . Esophagitis   . GERD (gastroesophageal reflux disease)   . History of colon polyps   . History of echocardiogram    a. 08/2013: normal LVSF, nl RVSF, no valvular regurgitation or stenosis   . Hyperlipidemia   . Hypertension   . Hypothyroidism   . Mild reactive airways disease   . Osteoarthritis   . PONV (postoperative nausea and vomiting)   . Rheumatoid arthritis ([place])   . Stroke [facility])    a. 05/2014 pt c/o garbled speech following cath 5/19. MRI/A 5/26 showed left caudate infarct->conservative mgmt per neuro.  . Thyroid cancer ([place])    a. s/p right sided hemi-thyroidectomy; b. planning for radioactive iodine tx; c. followed by Dr. Marcelene Butte  . Type 2 diabetes mellitus (HCC)     Family History  Problem Relation Age of Onset  . Heart disease Sister        stent placed x 3     Past Surgical History:  Procedure Laterality Date  . ABDOMINAL HYSTERECTOMY    . APPENDECTOMY    . AUGMENTATION MAMMAPLASTY    . BACK SURGERY    .  BREAST IMPLANT REMOVAL  06/2001  . CARDIAC CATHETERIZATION N/A 06/06/2014   Procedure: Coronary/Graft Atherectomy;  Surgeon: Wellington Hampshire, MD;  Location: [facility];  Service: Cardiovascular;  Laterality: N/A;  . CARDIAC CATHETERIZATION N/A 05/31/2014   Procedure: Left Heart Cath;  Surgeon: Minna Merritts, MD;  Location: [facility];  Service: Cardiovascular;  Laterality: N/A;  . CARDIAC CATHETERIZATION N/A 05/31/2014   Procedure: Coronary Stent Intervention;  Surgeon: Wellington Hampshire, MD;  Location: [facility];  Service: Cardiovascular;  Laterality: N/A;  . CARPAL TUNNEL RELEASE     "I've have a total of 7" (06/06/2014)  . CATARACT EXTRACTION W/ INTRAOCULAR LENS  IMPLANT, BILATERAL Bilateral   . CHOLECYSTECTOMY    . CORONARY ANGIOPLASTY WITH STENT PLACEMENT  05/31/2014; 06/06/2014   "2; 1"  . GANGLION CYST EXCISION Right    AC joint  . LUMBAR LAMINECTOMY  X 6  . MENISCUS REPAIR Bilateral    "2 on the right; 1 on the left"  . ROTATOR CUFF REPAIR     "I think 2 left, 2 right"  . THYROIDECTOMY    . TONSILLECTOMY    . TUBAL LIGATION     Social History   Occupational History  . Occupation: retired  Tobacco Use  . Smoking status: Never Smoker  . Smokeless tobacco: Never Used  Substance and Sexual Activity  . Alcohol use: No  . Drug use: No  . Sexual activity: Not Currently

## 2017-12-28 ENCOUNTER — Other Ambulatory Visit: Payer: Self-pay | Admitting: Cardiovascular Disease

## 2018-01-09 ENCOUNTER — Other Ambulatory Visit (INDEPENDENT_AMBULATORY_CARE_PROVIDER_SITE_OTHER): Payer: Self-pay | Admitting: Orthopedic Surgery

## 2018-01-13 ENCOUNTER — Other Ambulatory Visit: Payer: Self-pay | Admitting: Internal Medicine

## 2018-02-09 ENCOUNTER — Other Ambulatory Visit (INDEPENDENT_AMBULATORY_CARE_PROVIDER_SITE_OTHER): Payer: Self-pay | Admitting: Orthopedic Surgery

## 2018-02-20 IMAGING — US US EXTREM LOW VENOUS*L*
1 series · 13 of 24 positions shown · non-contrast
Comparison: 05/29/2015, 10/15/2011

CLINICAL DATA: 81-year-old female with a history of left leg pain



[Series 1: us extrem low venous*left* · 0.07mm/px · 13 of 34 slices shown]
[im 1/34]
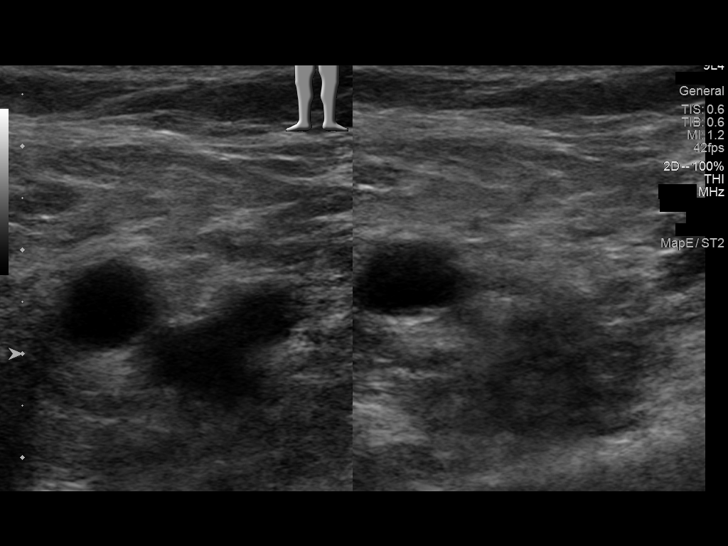
[im 3/34]
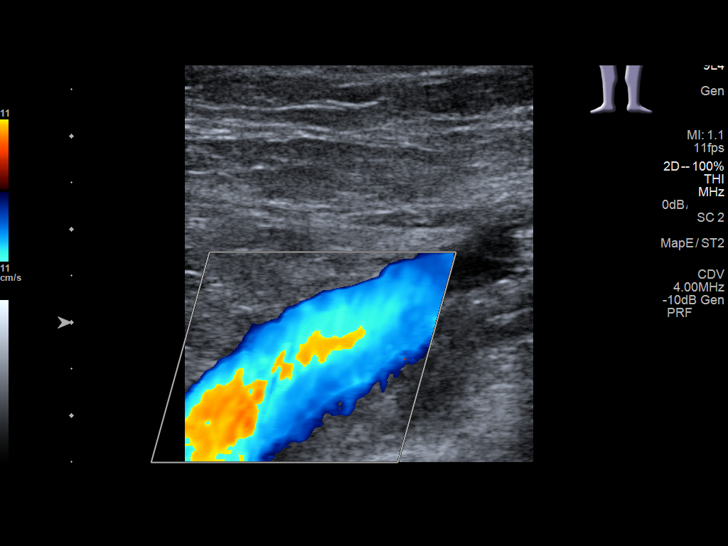
[im 6/34]
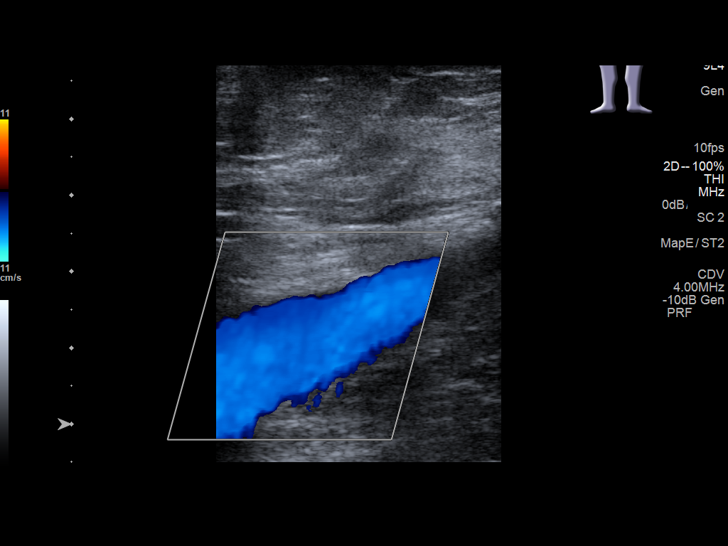
[im 9/34]
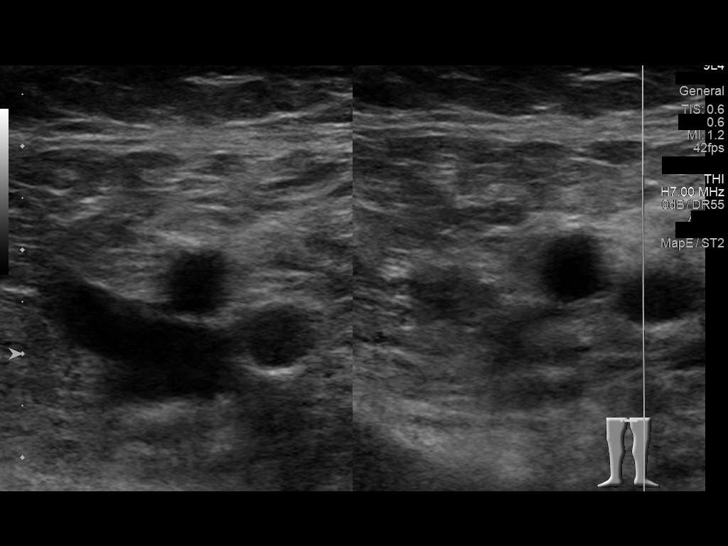
[im 12/34]
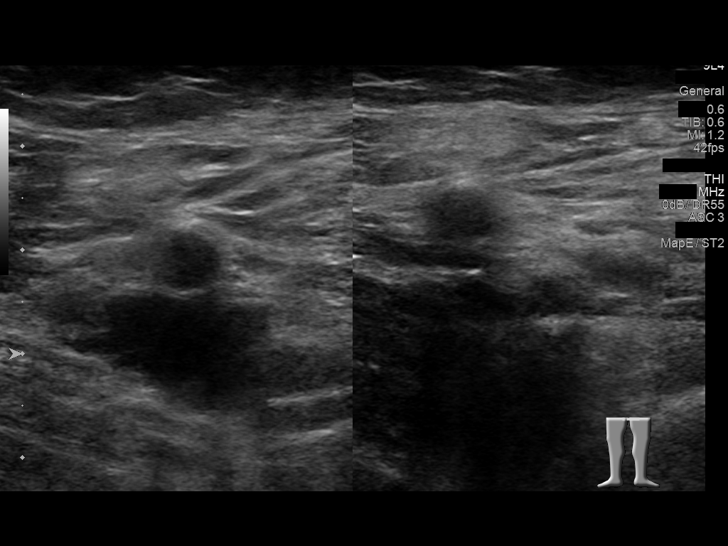
[im 15/34]
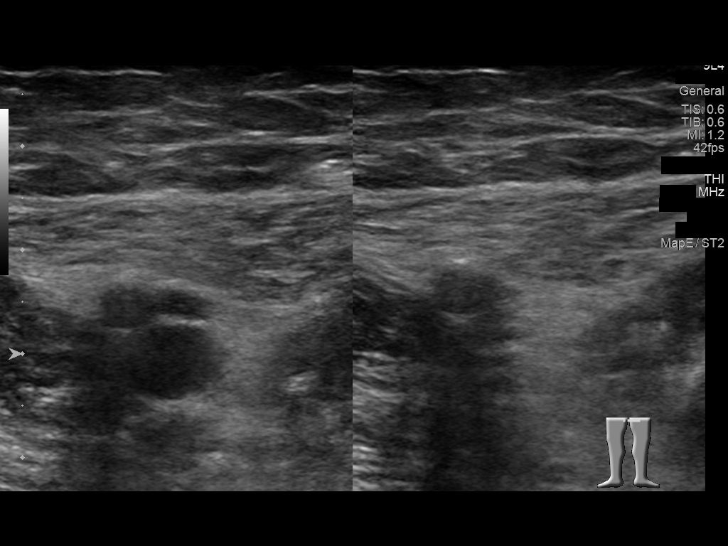
[im 18/34]
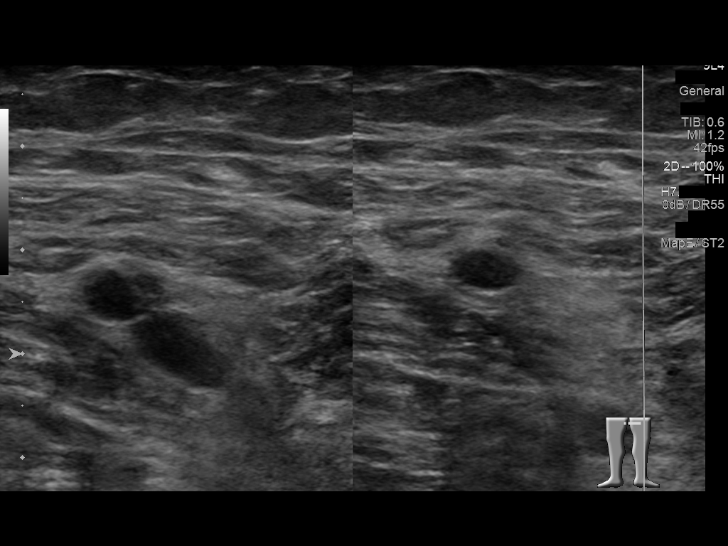
[im 19/34]
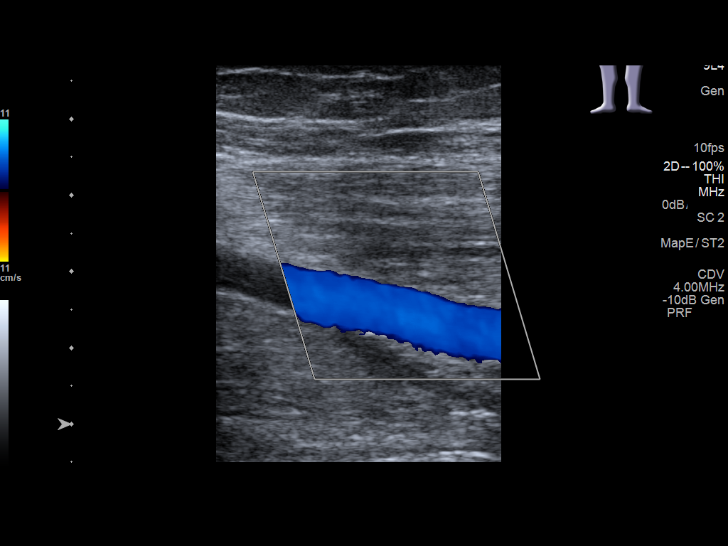
[im 22/34]
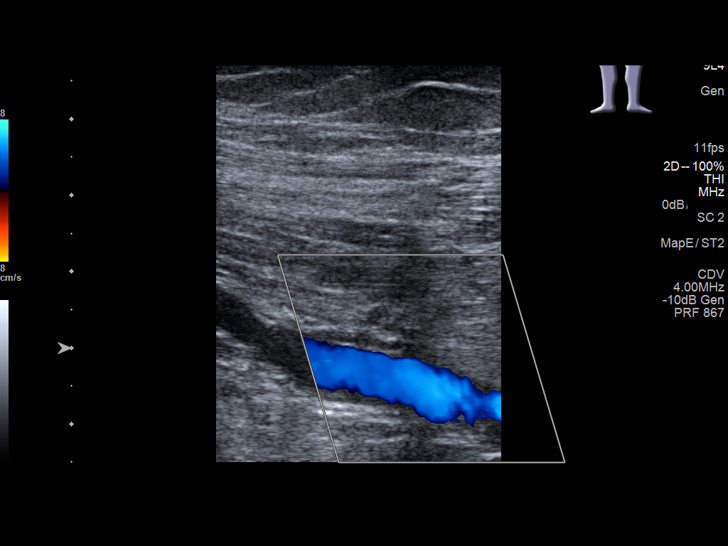
[im 25/34]
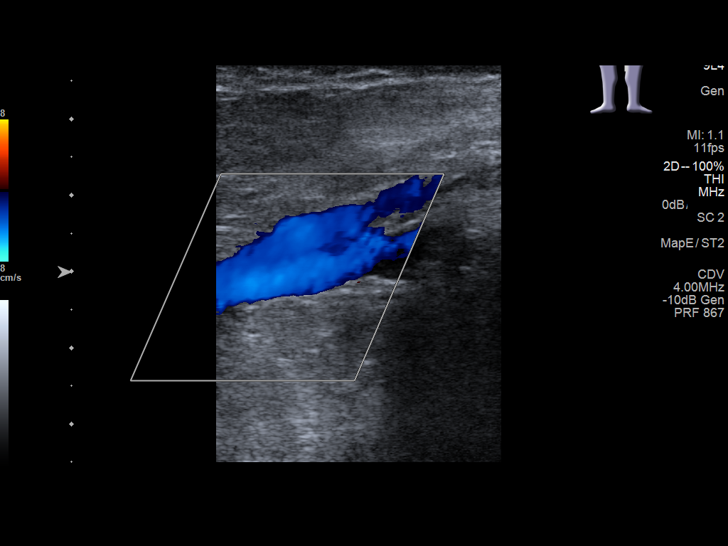
[im 28/34]
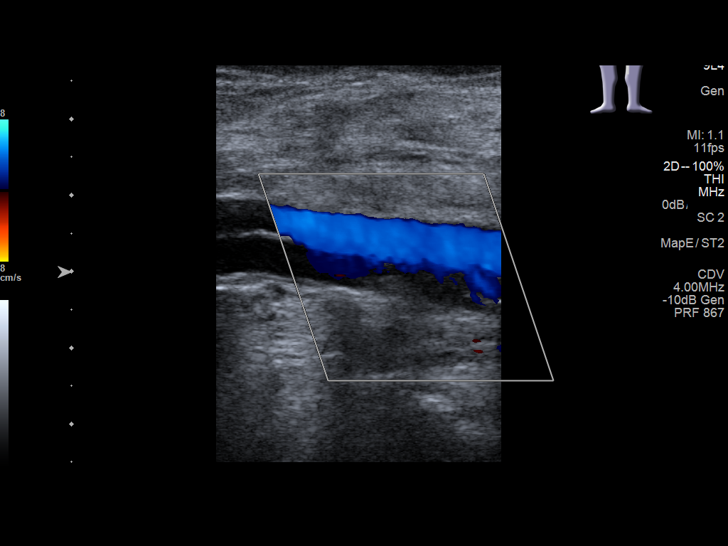
[im 31/34]
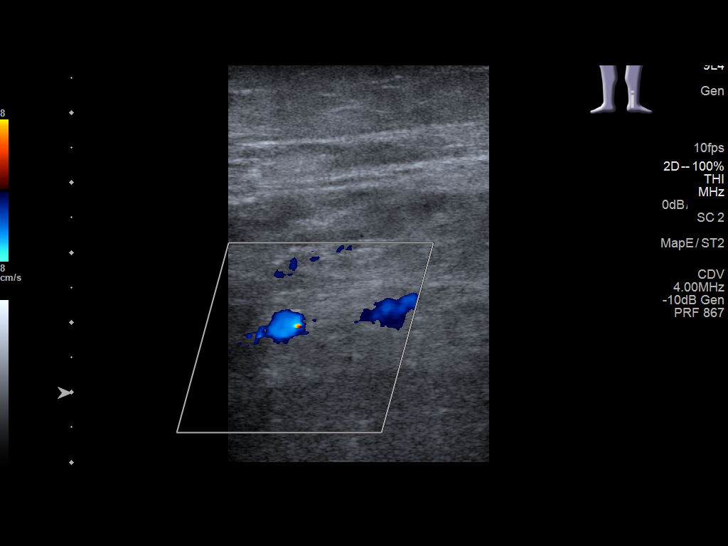
[im 34/34]
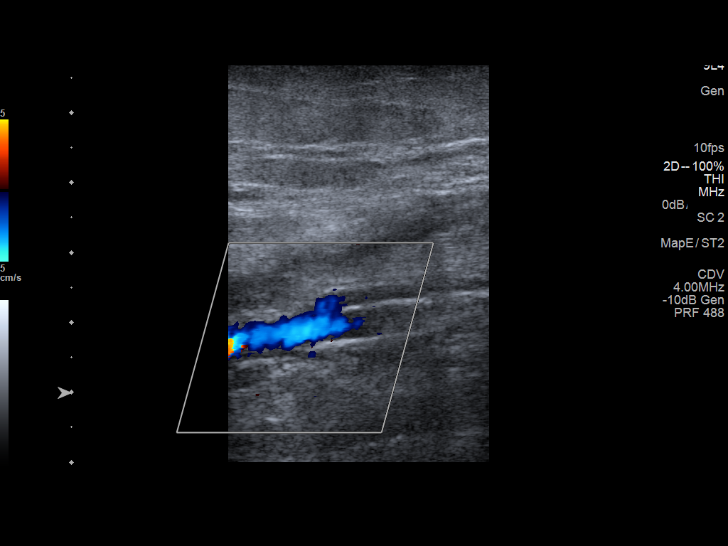

[13 of 24 positions shown; findings below may reference images not displayed]

FINDINGS: Contralateral Common Femoral Vein: Respiratory phasicity is normal
and symmetric with the symptomatic side. No evidence of thrombus.
Normal compressibility.

Common Femoral Vein: No evidence of thrombus. Normal
compressibility, respiratory phasicity and response to augmentation.

Saphenofemoral Junction: No evidence of thrombus. Normal
compressibility and flow on color Doppler imaging.

Profunda Femoral Vein: No evidence of thrombus. Normal
compressibility and flow on color Doppler imaging.

Femoral Vein: No evidence of thrombus. Normal compressibility,
respiratory phasicity and response to augmentation.

Popliteal Vein: No evidence of thrombus. Normal compressibility,
respiratory phasicity and response to augmentation.

Calf Veins: No evidence of thrombus. Normal compressibility and flow
on color Doppler imaging.

Superficial Great Saphenous Vein: No evidence of thrombus. Normal
compressibility and flow on color Doppler imaging.

Other Findings:  None.
IMPRESSION: Sonographic survey of the left lower extremity negative for DVT.

## 2018-03-04 ENCOUNTER — Other Ambulatory Visit: Payer: Self-pay | Admitting: Internal Medicine

## 2018-03-05 ENCOUNTER — Other Ambulatory Visit: Payer: Self-pay | Admitting: Cardiovascular Disease

## 2018-03-05 DIAGNOSIS — I2510 Atherosclerotic heart disease of native coronary artery without angina pectoris: Secondary | ICD-10-CM

## 2018-03-22 ENCOUNTER — Other Ambulatory Visit (INDEPENDENT_AMBULATORY_CARE_PROVIDER_SITE_OTHER): Payer: Self-pay | Admitting: Orthopedic Surgery

## 2018-03-24 ENCOUNTER — Telehealth (INDEPENDENT_AMBULATORY_CARE_PROVIDER_SITE_OTHER): Payer: Self-pay | Admitting: Orthopedic Surgery

## 2018-03-24 ENCOUNTER — Other Ambulatory Visit (INDEPENDENT_AMBULATORY_CARE_PROVIDER_SITE_OTHER): Payer: Self-pay

## 2018-03-24 ENCOUNTER — Telehealth (INDEPENDENT_AMBULATORY_CARE_PROVIDER_SITE_OTHER): Payer: Self-pay

## 2018-03-24 NOTE — Telephone Encounter (Signed)
Patient called to request an RX refill on some medications that she is almost out of.  She is requesting a refill on her Allopurinol, Prednisone, and Metoprolol.  CB#[phone].  Thank you.

## 2018-03-25 ENCOUNTER — Telehealth (INDEPENDENT_AMBULATORY_CARE_PROVIDER_SITE_OTHER): Payer: Self-pay

## 2018-03-25 NOTE — Telephone Encounter (Signed)
I called patient and left vm for  her that she needs to be seen for uric acid medication Allopurinol due to last test done in 2018 and she will need updated test results. The Prednisone has not been a recent medication given so she would need to be seen for that one also. Metoprolol is a medication she needs to refill with her primary doctor because Dr Sharol Given does not prescribe this particular medication. If she have any questions or concerns she can be scheduled in for an appt with Dr Sharol Given.

## 2018-03-28 ENCOUNTER — Ambulatory Visit (INDEPENDENT_AMBULATORY_CARE_PROVIDER_SITE_OTHER): Payer: Medicare Other | Admitting: Orthopedic Surgery

## 2018-03-28 ENCOUNTER — Other Ambulatory Visit: Payer: Self-pay

## 2018-03-28 VITALS — Ht 62.0 in | Wt 135.0 lb

## 2018-03-28 DIAGNOSIS — M1A09X0 Idiopathic chronic gout, multiple sites, without tophus (tophi): Secondary | ICD-10-CM

## 2018-03-28 DIAGNOSIS — M79672 Pain in left foot: Secondary | ICD-10-CM | POA: Diagnosis not present

## 2018-03-28 DIAGNOSIS — M79671 Pain in right foot: Secondary | ICD-10-CM | POA: Diagnosis not present

## 2018-03-28 DIAGNOSIS — M7542 Impingement syndrome of left shoulder: Secondary | ICD-10-CM | POA: Diagnosis not present

## 2018-03-28 DIAGNOSIS — I119 Hypertensive heart disease without heart failure: Secondary | ICD-10-CM

## 2018-03-28 DIAGNOSIS — E785 Hyperlipidemia, unspecified: Secondary | ICD-10-CM

## 2018-03-28 DIAGNOSIS — I25118 Atherosclerotic heart disease of native coronary artery with other forms of angina pectoris: Secondary | ICD-10-CM

## 2018-03-28 DIAGNOSIS — E119 Type 2 diabetes mellitus without complications: Secondary | ICD-10-CM

## 2018-03-29 ENCOUNTER — Encounter (INDEPENDENT_AMBULATORY_CARE_PROVIDER_SITE_OTHER): Payer: Self-pay | Admitting: Orthopedic Surgery

## 2018-03-29 ENCOUNTER — Telehealth (INDEPENDENT_AMBULATORY_CARE_PROVIDER_SITE_OTHER): Payer: Self-pay

## 2018-03-29 MED ORDER — ALLOPURINOL 100 MG PO TABS: 100.0000 mg | ORAL_TABLET | Freq: Two times a day (BID) | ORAL | 3 refills | Status: DC

## 2018-03-29 MED ORDER — COLCHICINE 0.6 MG PO CAPS: 0.6000 mg | ORAL_CAPSULE | Freq: Two times a day (BID) | ORAL | 3 refills | Status: DC | PRN

## 2018-03-29 MED ORDER — PREDNISONE 10 MG PO TABS: 20.0000 mg | ORAL_TABLET | Freq: Every day | ORAL | 0 refills | Status: DC

## 2018-03-29 NOTE — Telephone Encounter (Signed)
Patient was called about Rx pickup.

## 2018-03-29 NOTE — Progress Notes (Signed)
Office Visit Note   Patient: [name]           Date of Birth: 1933-09-20           MRN: [id number] Visit Date: 03/28/2018              Requested by: Idelle Crouch, MD [facility] [place], [place] [address] PCP: Idelle Crouch, MD  Chief Complaint  Patient presents with  . Left Shoulder - Follow-up  . Left Foot - Pain  . Right Foot - Pain      HPI: Patient is a 85 year old woman with chronic Her last uric acid was 3.9 she is on allopurinol twice a day and uses colchicine for flareups and has used prednisone in the past as well.  Patient states she is done physical therapy without improvement in her left shoulder.  Patient denies any acute gouty flareups.  Assessment & Plan: Visit Diagnoses:  1. Idiopathic chronic gout of multiple sites without tophus   2. Impingement syndrome of left shoulder     Plan: Patient was a difficult blood draw and she will have these drawn at the hospital for uric acid.  A refill was sent to the pharmacy for allopurinol colchicine and prednisone.  Reevaluate in 6 months.  Follow-Up Instructions: Return in about 6 months (around 09/28/2018).   Ortho Exam  Patient is alert, oriented, no adenopathy, well-dressed, normal affect, normal respiratory effort. Examination patient has pain with range of motion of her shoulder.  Examination of her lower extremities there is no redness no cellulitis no tophaceous deposits no evidence of any acute gout.  Imaging: No results found. No images are attached to the encounter.  Labs: Lab Results  Component Value Date   ESRSEDRATE 21 10/31/2013   LABURIC 3.9 12/29/2016   LABURIC 5.2 06/22/2016   LABURIC 4.4 03/17/2016   REPTSTATUS 03/20/2016 FINAL 03/18/2016   REPTSTATUS 04/08/2016 FINAL 03/18/2016   CULT  03/18/2016    MODERATE STAPHYLOCOCCUS SPECIES (COAGULASE NEGATIVE) CALL MICROBIOLOGY LAB IF SENSITIVITIES ARE REQUIRED. Performed at [facility],  [place] [address] [place], [place] [address]    CULT  03/18/2016    NO FUNGUS ISOLATED AFTER 21 DAYS Performed at [facility], [place] [address] [place], [place] [address]      Lab Results  Component Value Date   ALBUMIN 3.8 10/19/2017   ALBUMIN 4.0 04/14/2017   ALBUMIN 3.9 10/12/2016   LABURIC 3.9 12/29/2016   LABURIC 5.2 06/22/2016   LABURIC 4.4 03/17/2016    Body mass index is 24.69 kg/m.  Orders:  No orders of the defined types were placed in this encounter.  Meds ordered this encounter  Medications  . Colchicine 0.6 MG CAPS    Sig: Take 0.6 mg by mouth 2 (two) times daily as needed.    Dispense:  60 capsule    Refill:  3  . allopurinol (ZYLOPRIM) 100 MG tablet    Sig: Take 1 tablet (100 mg total) by mouth 2 (two) times daily.    Dispense:  60 tablet    Refill:  3  . predniSONE (DELTASONE) 10 MG tablet    Sig: Take 2 tablets (20 mg total) by mouth daily with breakfast.    Dispense:  60 tablet    Refill:  0     Procedures: No procedures performed  Clinical Data: No additional findings.  ROS:  All other systems negative, except as noted in the HPI.  Review of Systems  Objective: Vital Signs: Ht 5\' 2"  (1.575 m)   Wt 135 lb (61.2 kg)   BMI 24.69 kg/m   Specialty Comments:  No specialty comments available.  PMFS History: Patient Active Problem List   Diagnosis Date Noted  . Achilles tendon contracture, bilateral 06/29/2017  . Acute bilateral low back pain without sciatica 03/17/2016  . Idiopathic chronic gout of multiple sites without tophus 03/17/2016  . Idiopathic gout of multiple sites 11/27/2015  . Cellulitis 05/15/2015  . Bilateral leg edema 02/08/2015  . Chronic anxiety 11/22/2014  . Cerebrovascular accident (CVA) ([place]) 07/25/2014  . Arterial vascular disease 07/25/2014  . Swelling of right lower extremity 06/22/2014  . Diabetes mellitus ([place])   . Stroke ([place]) 06/08/2014  . Anemia 06/07/2014  . Unstable angina ([place]) 06/06/2014  . Carotid  artery disease ([place])   . Atherosclerosis of native coronary artery with stable angina pectoris ([place])   . Hyperlipidemia   . Essential hypertension   . Angina pectoris ([employer]) 05/31/2014  . SOB (shortness of breath) 04/11/2014  . Palpitations 04/11/2014  . Other fatigue 04/11/2014  . Thyroid cancer ([place]) 04/11/2014   Past Medical History:  Diagnosis Date  . CAD (coronary artery disease)    a. lexiscan 08/2013: nl wall motion, no ischemia, EF 50-55%; b. cardiac cath 05/31/2014: mLAD 90%, dLAD 50%, dLCx 60%, 1st Mrg 80%, pRCA 60% s/p PCI/DES, mRCA 1st lesion 70%, mRCA 2nd 95% s/p PCI/long DES to cover entire mRCA, RPLB 40%;  c. 05/2014 Staged PCI of LAD w/ 2.5 x 15 mm Xience Alpine DES.  . Carotid artery disease ([place])   . Chronic anxiety   . Diverticulosis   . Esophagitis   . GERD (gastroesophageal reflux disease)   . History of colon polyps   . History of echocardiogram    a. 08/2013: normal LVSF, nl RVSF, no valvular regurgitation or stenosis   . Hyperlipidemia   . Hypertension   . Hypothyroidism   . Mild reactive airways disease   . Osteoarthritis   . PONV (postoperative nausea and vomiting)   . Rheumatoid arthritis ([place])   . Stroke [facility])    a. 05/2014 pt c/o garbled speech following cath 5/19. MRI/A 5/26 showed left caudate infarct->conservative mgmt per neuro.  . Thyroid cancer ([place])    a. s/p right sided hemi-thyroidectomy; b. planning for radioactive iodine tx; c. followed by Dr. Marcelene Butte  . Type 2 diabetes mellitus (HCC)     Family History  Problem Relation Age of Onset  . Heart disease Sister        stent placed x 3     Past Surgical History:  Procedure Laterality Date  . ABDOMINAL HYSTERECTOMY    . APPENDECTOMY    . AUGMENTATION MAMMAPLASTY    . BACK SURGERY    . BREAST IMPLANT REMOVAL  06/2001  . CARDIAC CATHETERIZATION N/A 06/06/2014   Procedure: Coronary/Graft Atherectomy;  Surgeon: Wellington Hampshire, MD;  Location: [facility];  Service: Cardiovascular;   Laterality: N/A;  . CARDIAC CATHETERIZATION N/A 05/31/2014   Procedure: Left Heart Cath;  Surgeon: Minna Merritts, MD;  Location: [facility];  Service: Cardiovascular;  Laterality: N/A;  . CARDIAC CATHETERIZATION N/A 05/31/2014   Procedure: Coronary Stent Intervention;  Surgeon: Wellington Hampshire, MD;  Location: [facility];  Service: Cardiovascular;  Laterality: N/A;  . CARPAL TUNNEL RELEASE     "I've have a total of 7" (06/06/2014)  . CATARACT EXTRACTION W/ INTRAOCULAR LENS  IMPLANT, BILATERAL Bilateral   . CHOLECYSTECTOMY    . CORONARY ANGIOPLASTY WITH STENT PLACEMENT  05/31/2014; 06/06/2014   "2; 1"  . GANGLION CYST EXCISION Right    AC joint  . LUMBAR LAMINECTOMY  X 6  . MENISCUS REPAIR Bilateral    "2 on the right; 1 on the left"  . ROTATOR CUFF REPAIR     "I think 2 left, 2 right"  . THYROIDECTOMY    . TONSILLECTOMY    . TUBAL LIGATION     Social History   Occupational History  . Occupation: retired  Tobacco Use  . Smoking status: Never Smoker  . Smokeless tobacco: Never Used  Substance and Sexual Activity  . Alcohol use: No  . Drug use: No  . Sexual activity: Not Currently

## 2018-04-06 ENCOUNTER — Telehealth: Payer: Self-pay | Admitting: Cardiovascular Disease

## 2018-04-06 NOTE — Telephone Encounter (Signed)
TELEPHONE CALL NOTE  [name] has been deemed a candidate for a follow-up tele-health visit to limit community exposure during the Covid-19 pandemic. I spoke with the patient via phone to ensure availability of phone/video source, confirm preferred email & phone number, discuss instructions and expectations, and review consent.   I reminded [name] to be prepared with any vital sign and/or heart rhythm information that could potentially be obtained via home monitoring, at the time of her visit.  Finally, I reminded [name] to expect an e-mail containing a link for their video-based visit approximately 15 minutes before her visit, or alternatively, a phone call at the time of her visit if her visit is planned to be a phone encounter.  Did the patient verbally consent to treatment as below? YES  [name] 04/06/2018 2:59 PM  DOWNLOADING THE SOFTWARE (If applicable)  Download the [employer] app to enable video and telephone visits with your [facility] Provider.   Instructions for downloading [employer] WebEx: - Go to https://www.webex.com/downloads.html and follow the instructions - If you have technical difficulties with downloading WebEx, please call WebEx at [phone]. - Once the app is downloaded (can be done on either mobile or desktop computer), go to Settings in the upper left hand corner.  Be sure that camera and audio are enabled.  - You will receive an email message with a link to the meeting with a time to join for your tele-health visit.  - Please download the app and have settings configured prior to the appointment time.    CONSENT FOR TELE-HEALTH VISIT - PLEASE REVIEW  I hereby voluntarily request, consent and authorize [facility] and its employed or contracted physicians, physician assistants, nurse practitioners or other licensed health care professionals (the Practitioner), to provide me with telemedicine health care services  (the Services") as deemed necessary by the treating Practitioner. I acknowledge and consent to receive the Services by the Practitioner via telemedicine. I understand that the telemedicine visit will involve communicating with the Practitioner through live audiovisual communication technology and the disclosure of certain medical information by electronic transmission. I acknowledge that I have been given the opportunity to request an in-person assessment or other available alternative prior to the telemedicine visit and am voluntarily participating in the telemedicine visit.  I understand that I have the right to withhold or withdraw my consent to the use of telemedicine in the course of my care at any time, without affecting my right to future care or treatment, and that the Practitioner or I may terminate the telemedicine visit at any time. I understand that I have the right to inspect all information obtained and/or recorded in the course of the telemedicine visit and may receive copies of available information for a reasonable fee.  I understand that some of the potential risks of receiving the Services via telemedicine include:   Delay or interruption in medical evaluation due to technological equipment failure or disruption;  Information transmitted may not be sufficient (e.g. poor resolution of images) to allow for appropriate medical decision making by the Practitioner; and/or   In rare instances, security protocols could fail, causing a breach of personal health information.  Furthermore, I acknowledge that it is my responsibility to provide information about my medical history, conditions and care that is complete and accurate to the best of my ability. I acknowledge that Practitioner's advice, recommendations, and/or decision may be based on factors not within their control, such as incomplete  or inaccurate data provided by me or distortions of diagnostic images or specimens that may result  from electronic transmissions. I understand that the practice of medicine is not an exact science and that Practitioner makes no warranties or guarantees regarding treatment outcomes. I acknowledge that I will receive a copy of this consent concurrently upon execution via email to the email address I last provided but may also request a printed copy by calling the office of [facility].    I understand that my insurance will be billed for this visit.   I have read or had this consent read to me.  I understand the contents of this consent, which adequately explains the benefits and risks of the Services being provided via telemedicine.   I have been provided ample opportunity to ask questions regarding this consent and the Services and have had my questions answered to my satisfaction.  I give my informed consent for the services to be provided through the use of telemedicine in my medical care  By participating in this telemedicine visit I agree to the above.

## 2018-04-07 NOTE — Telephone Encounter (Signed)
Spoke with patient and she revoked her consent for video or telephone virtual visits. She states that she wants to see a provider. Reviewed that due to current virus we do not have appointments here in the office and will make provider aware. She reports that she woke up once with chest pressure and took one nitroglycerin and it went away. This is the reason for her urgent referral from PCP office. Advised that I would send this information over to provider for review and would be in touch once we can get her in for appointment.      Cardiac Questionnaire:    Since your last visit or hospitalization:    1. Have you been having new or worsening chest pain? Yes   2. Have you been having new or worsening shortness of breath? No 3. Have you been having new or worsening leg swelling, wt gain, or increase in abdominal girth (pants fitting more tightly)? No   4. Have you had any passing out spells? No    *A YES to any of these questions would result in the appointment being kept. *If all the answers to these questions are NO, we should indicate that given the current situation regarding the worldwide coronarvirus pandemic, at the recommendation of the CDC, we are looking to limit gatherings in our waiting area, and thus will reschedule their appointment beyond four weeks from today.   _____________   [id number] Pre-Screening Questions:  . Do you currently have a fever? No (yes = cancel and refer to pcp for e-visit) . Have you recently travelled on a cruise, internationally, or to [place], [place], [place], [place], [place], or [place], [place] [employer]) ? No (yes = cancel, stay home, monitor symptoms, and contact pcp or initiate e-visit if symptoms develop) . Have you been in contact with someone that is currently pending confirmation of Covid19 testing or has been confirmed to have the [place] virus?  No (yes = cancel, stay home, away from tested individual, monitor symptoms, and contact pcp or initiate e-visit if symptoms  develop) . Are you currently experiencing fatigue or cough? No (yes = pt should be prepared to have a mask placed at the time of their visit).

## 2018-04-07 NOTE — Telephone Encounter (Signed)
Patient calling Patient has decided against doing the telephone visit  Please advise if patient will need an in office appointment - patient is an urgent referral from Dr Doy Hutching

## 2018-04-08 ENCOUNTER — Ambulatory Visit: Payer: Medicare Other | Admitting: Cardiovascular Disease

## 2018-04-08 NOTE — Telephone Encounter (Signed)
Left voicemail message to call back and verify appointment information and need for visit.

## 2018-04-08 NOTE — Telephone Encounter (Addendum)
Left voicemail message to call us back regarding her appointment today with provider so that I can review entrance to facility and review her medications.

## 2018-04-08 NOTE — Telephone Encounter (Signed)
Left voicemail message to call back to review appointment information.

## 2018-04-12 NOTE — Telephone Encounter (Signed)
Pam, Do you want me to schedule her in office ?

## 2018-04-18 ENCOUNTER — Other Ambulatory Visit: Payer: Self-pay

## 2018-04-18 ENCOUNTER — Inpatient Hospital Stay: Payer: Medicare Other | Attending: Internal Medicine

## 2018-04-18 DIAGNOSIS — Z79899 Other long term (current) drug therapy: Secondary | ICD-10-CM | POA: Diagnosis not present

## 2018-04-18 DIAGNOSIS — D649 Anemia, unspecified: Secondary | ICD-10-CM | POA: Diagnosis not present

## 2018-04-18 DIAGNOSIS — Z923 Personal history of irradiation: Secondary | ICD-10-CM | POA: Diagnosis not present

## 2018-04-18 DIAGNOSIS — I10 Essential (primary) hypertension: Secondary | ICD-10-CM | POA: Insufficient documentation

## 2018-04-18 DIAGNOSIS — E890 Postprocedural hypothyroidism: Secondary | ICD-10-CM | POA: Insufficient documentation

## 2018-04-18 DIAGNOSIS — C73 Malignant neoplasm of thyroid gland: Secondary | ICD-10-CM

## 2018-04-18 DIAGNOSIS — Z85850 Personal history of malignant neoplasm of thyroid: Secondary | ICD-10-CM | POA: Insufficient documentation

## 2018-04-18 LAB — CBC WITH DIFFERENTIAL/PLATELET
Abs Immature Granulocytes: 0.07 10*3/uL (ref 0.00–0.07)
Basophils Absolute: 0 10*3/uL (ref 0.0–0.1)
Basophils Relative: 0 %
Eosinophils Absolute: 0 10*3/uL (ref 0.0–0.5)
Eosinophils Relative: 0 %
HCT: 32.4 % — ABNORMAL LOW (ref 36.0–46.0)
Hemoglobin: 10.4 g/dL — ABNORMAL LOW (ref 12.0–15.0)
Immature Granulocytes: 1 %
Lymphocytes Relative: 8 %
Lymphs Abs: 0.9 10*3/uL (ref 0.7–4.0)
MCH: 29.6 pg (ref 26.0–34.0)
MCHC: 32.1 g/dL (ref 30.0–36.0)
MCV: 92.3 fL (ref 80.0–100.0)
Monocytes Absolute: 0.4 10*3/uL (ref 0.1–1.0)
Monocytes Relative: 4 %
Neutro Abs: 9.3 10*3/uL — ABNORMAL HIGH (ref 1.7–7.7)
Neutrophils Relative %: 87 %
Platelets: 232 10*3/uL (ref 150–400)
RBC: 3.51 MIL/uL — ABNORMAL LOW (ref 3.87–5.11)
RDW: 15 % (ref 11.5–15.5)
WBC: 10.7 10*3/uL — ABNORMAL HIGH (ref 4.0–10.5)
nRBC: 0 % (ref 0.0–0.2)

## 2018-04-18 LAB — COMPREHENSIVE METABOLIC PANEL
ALT: 15 U/L (ref 0–44)
AST: 23 U/L (ref 15–41)
Albumin: 3.9 g/dL (ref 3.5–5.0)
Alkaline Phosphatase: 59 U/L (ref 38–126)
Anion gap: 10 (ref 5–15)
BUN: 30 mg/dL — ABNORMAL HIGH (ref 8–23)
CO2: 21 mmol/L — ABNORMAL LOW (ref 22–32)
Calcium: 8.9 mg/dL (ref 8.9–10.3)
Chloride: 107 mmol/L (ref 98–111)
Creatinine, Ser: 0.99 mg/dL (ref 0.44–1.00)
GFR calc Af Amer: >60 mL/min (ref >60)
GFR calc non Af Amer: 53 mL/min — ABNORMAL LOW (ref >60)
Glucose, Bld: 142 mg/dL — ABNORMAL HIGH (ref 70–99)
Potassium: 3.9 mmol/L (ref 3.5–5.1)
Sodium: 138 mmol/L (ref 135–145)
Total Bilirubin: 0.4 mg/dL (ref 0.3–1.2)
Total Protein: 6.3 g/dL — ABNORMAL LOW (ref 6.5–8.1)

## 2018-04-18 NOTE — Telephone Encounter (Signed)
Left voicemail message for patient to call back regarding appointment.

## 2018-04-19 LAB — THYROID PANEL WITH TSH
Free Thyroxine Index: 2.2 (ref 1.2–4.9)
T3 Uptake Ratio: 28 % (ref 24–39)
T4, Total: 7.9 ug/dL (ref 4.5–12.0)
TSH: 1.46 u[IU]/mL (ref 0.450–4.500)

## 2018-04-19 LAB — TGAB+THYROGLOBULIN IMA OR RIA: Thyroglobulin Antibody: <1 IU/mL (ref 0.0–0.9)

## 2018-04-19 LAB — THYROGLOBULIN BY IMA: Thyroglobulin by IMA: <0.1 ng/mL — ABNORMAL LOW (ref 1.5–38.5)

## 2018-04-20 ENCOUNTER — Other Ambulatory Visit: Payer: Self-pay | Admitting: Cardiovascular Disease

## 2018-04-20 ENCOUNTER — Other Ambulatory Visit: Payer: Self-pay | Admitting: Internal Medicine

## 2018-04-21 NOTE — Telephone Encounter (Signed)
Recall Entered

## 2018-04-25 ENCOUNTER — Inpatient Hospital Stay: Payer: Medicare Other | Admitting: Internal Medicine

## 2018-04-27 ENCOUNTER — Other Ambulatory Visit: Payer: Self-pay | Admitting: Cardiovascular Disease

## 2018-04-28 ENCOUNTER — Other Ambulatory Visit: Payer: Self-pay | Admitting: *Deleted

## 2018-04-28 MED ORDER — ROSUVASTATIN CALCIUM 5 MG PO TABS: 5.0000 mg | ORAL_TABLET | Freq: Every day | ORAL | 2 refills | Status: DC

## 2018-05-16 ENCOUNTER — Other Ambulatory Visit: Payer: Self-pay | Admitting: Nurse Practitioner

## 2018-05-16 DIAGNOSIS — C73 Malignant neoplasm of thyroid gland: Secondary | ICD-10-CM

## 2018-06-01 ENCOUNTER — Other Ambulatory Visit: Payer: Self-pay | Admitting: Cardiovascular Disease

## 2018-06-15 ENCOUNTER — Other Ambulatory Visit: Payer: Self-pay | Admitting: Cardiovascular Disease

## 2018-06-22 ENCOUNTER — Other Ambulatory Visit: Payer: Self-pay | Admitting: Cardiovascular Disease

## 2018-06-22 ENCOUNTER — Telehealth: Payer: Self-pay | Admitting: Cardiovascular Disease

## 2018-06-22 NOTE — Telephone Encounter (Signed)
Pt c/o swelling: STAT is pt has developed SOB within 24 hours  1) How much weight have you gained and in what time span? 20 pounds since March, states she has been eating a lot more though  2) If swelling, where is the swelling located? Bilateral ankles  3) Are you currently taking a fluid pill? no  4) Are you currently SOB? no  5) Do you have a log of your daily weights (if so, list)? No log  6) Have you gained 3 pounds in a day or 5 pounds in a week? Probably, not sure 7) Have you traveled recently? no

## 2018-06-22 NOTE — Telephone Encounter (Signed)
lmovm to verify pt's refill and previous refills filled for nitro.

## 2018-06-22 NOTE — Telephone Encounter (Signed)
I am hesitant to send in Lasix without seeing the swelling This is in part because looking at the renal lab work shows some mild renal dysfunction and I do not want to make it worse by adding Lasix If she is not too uncomfortable will hold off on the Lasix until I can see her If symptoms are severe we could try low-dose Lasix but would need to proceed cautiously

## 2018-06-22 NOTE — Telephone Encounter (Signed)
Pt sts that she called to schedule an appt and was told that there are currently only virtual visits available and that an office visit with Dr. Rockey Situ will not be available until Aug 2020. Pt declined a virtual visit.  Pt sts that she has gained around 20lbs over the last 3 months. She feels as though her weight gain is due to her poor diet, increased caloric intake, and decrease activity levels due to the quarantine. Pt denies sob, orthopnea, PND. She does report increased LE edema. Pt sts that Dr. Rockey Situ had given her a prescription for lasix in the past and would like to know if he can prescribe it again.  Adv the pt that I will fwd the message to Dr. Rockey Situ to inquire on the Lasix Rx and fwd a message to his [place] so that she can add the pt to Dr. Donivan Scull in office visit recall list.  Pt agreeable with the plan and voiced appreciation.

## 2018-06-22 NOTE — Telephone Encounter (Signed)
Called patient.  Scheduled to see Dr. Rockey Situ in office 06/27/2018 @ 2:40pm.

## 2018-06-24 ENCOUNTER — Telehealth: Payer: Self-pay

## 2018-06-24 NOTE — Telephone Encounter (Signed)
Left voicemail message with appointment information and instructions that he will address fluid pill at that visit.

## 2018-06-24 NOTE — Telephone Encounter (Signed)
Spoke with Engineer, civil (consulting) at [facility]) They received this medication refill.  Will refused this request.

## 2018-06-24 NOTE — Telephone Encounter (Signed)

## 2018-06-24 NOTE — Telephone Encounter (Signed)
Called patient.  She states that the bottle she has she picked up on 05/31/2018. She states in a bottle of 25 she has about 15 left.  Made her aware I could call the pharmacy and see if they have the refill we sent on 06/15/2018 and for them to hold that until patient needed it.

## 2018-06-26 NOTE — Progress Notes (Signed)
Cardiology Office Note  Date:  06/27/2018   ID:  Fatemah, Pourciau 12-23-1933, MRN [id number]  PCP:  Idelle Crouch, MD   Chief Complaint  Patient presents with  . Other    Patient c/o swelling and weight gain. Meds reviewed verbally with patient.     HPI:  [name] is a 85 y/o female with PMH of  coronary artery disease,  cath May 2016 revealing severe LAD and RCA disease,  Percutaneous intervention and drug-eluting stent placement was performed to  the right coronary artery.The LAD was also felt to be amenable for PCI but it was felt that she would require a Rotablator. This was performed later at [facility] Jun 01, 2014 She presents today for routine follow-up for coronary artery disease  No significant leg swelling Rare NTG use She eats out every day,  drinks significant amount of water.  No falls in 6 months to 1 year Balance is poor walks with a cane  No vertigo, dizziness No chest pain, angina  Felt like she had a TIA last month Was talking funny  Lab work reviewed and her  in detail Total chol 147, LDL 65  In 11/2017 HBA1C 6.3  Weight now up  imdur has h/a Would like to stop  Denies any tachycardia or palpitations concerning for arrhythmia She watches her weight daily and takes Lasix for any increase in her weight No regular exercise,  Mild chronic shortness of breath on exertion  EKG personally reviewed by myself on todays visit Shows normal sinus rhythm rate 76 bpm no significant ST or T wave changes  emergency room September 27, 2017 for chest pain Cardiac enzymes negative, other work-up benign  Family stress, son and children  catotid 2016 Findings consistent with 1-39 percent stenosis involving the right internal carotid artery and the left internal carotid artery.  Prior history of falls Tripped on curb, hit left side of face, Went to the hospital had CT scan head Showed no acute findings, atrophy, chronic microvascular  disease  Previously fell in the bedroom emts came  Other past medical history reviewed Prior falls x 3 Mechanical, tripped on a mat, often stepping in the wrong place Very deconditioned, terrible gait instability, walks with a cane  Previous problems with gout, uric acid 8.1 On meloxican prn for pain  episode of garbled speech after PCI and MRI/A of the brain was performed and showed an acute, small, nonhemorrhagic infarct of the left caudate with moderate small vessel disease. She did not have any acute neurologic deficits.  Carotid ultrasound was performed and showed bilateral 1-39% internal carotid artery stenosis.   Thyroid nodule found, status post ablation, currently on thyroid supplementation This was performed March 2016  PMH:   has a past medical history of CAD (coronary artery disease), Carotid artery disease ([place]), Chronic anxiety, Diverticulosis, Esophagitis, GERD (gastroesophageal reflux disease), History of colon polyps, History of echocardiogram, Hyperlipidemia, Hypertension, Hypothyroidism, Mild reactive airways disease, Osteoarthritis, PONV (postoperative nausea and vomiting), Rheumatoid arthritis ([place]), Stroke ([place]), Thyroid cancer ([place]), and Type 2 diabetes mellitus ([place]).  PSH:    Past Surgical History:  Procedure Laterality Date  . ABDOMINAL HYSTERECTOMY    . APPENDECTOMY    . AUGMENTATION MAMMAPLASTY    . BACK SURGERY    . BREAST IMPLANT REMOVAL  06/2001  . CARDIAC CATHETERIZATION N/A 06/06/2014   Procedure: Coronary/Graft Atherectomy;  Surgeon: Wellington Hampshire, MD;  Location: [facility];  Service: Cardiovascular;  Laterality: N/A;  .  CARDIAC CATHETERIZATION N/A 05/31/2014   Procedure: Left Heart Cath;  Surgeon: Minna Merritts, MD;  Location: [facility];  Service: Cardiovascular;  Laterality: N/A;  . CARDIAC CATHETERIZATION N/A 05/31/2014   Procedure: Coronary Stent Intervention;  Surgeon: Wellington Hampshire, MD;  Location: [facility];  Service: Cardiovascular;  Laterality: N/A;  . CARPAL TUNNEL RELEASE     "I've have a total of 7" (06/06/2014)  . CATARACT EXTRACTION W/ INTRAOCULAR LENS  IMPLANT, BILATERAL Bilateral   . CHOLECYSTECTOMY    . CORONARY ANGIOPLASTY WITH STENT PLACEMENT  05/31/2014; 06/06/2014   "2; 1"  . GANGLION CYST EXCISION Right    AC joint  . LUMBAR LAMINECTOMY  X 6  . MENISCUS REPAIR Bilateral    "2 on the right; 1 on the left"  . ROTATOR CUFF REPAIR     "I think 2 left, 2 right"  . THYROIDECTOMY    . TONSILLECTOMY    . TUBAL LIGATION      Current Outpatient Medications  Medication Sig Dispense Refill  . allopurinol (ZYLOPRIM) 100 MG tablet Take 1 tablet (100 mg total) by mouth 2 (two) times daily. 60 tablet 3  . ALPRAZolam (XANAX) 0.25 MG tablet Take 0.25 mg by mouth at bedtime as needed for anxiety or sleep.     . Cholecalciferol 1000 UNITS tablet Take 1,000 Units by mouth daily.     . clopidogrel (PLAVIX) 75 MG tablet TAKE 1 TABLET DAILY 90 tablet 4  . Colchicine (MITIGARE) 0.6 MG CAPS Take 0.6 mg by mouth daily. 30 capsule 4  . ezetimibe (ZETIA) 10 MG tablet Take 1 tablet (10 mg total) by mouth daily. 90 tablet 3  . isosorbide mononitrate (IMDUR) 30 MG 24 hr tablet Take 30 mg by mouth daily. 0.5 tablet daily    . levothyroxine (SYNTHROID) 88 MCG tablet TAKE 1 TABLET(88 MCG) BY MOUTH DAILY 90 tablet 0  . metFORMIN (GLUCOPHAGE) 500 MG tablet Take 500 mg by mouth 2 (two) times a day.    . metoprolol succinate (TOPROL XL) 50 MG 24 hr tablet Take 1 tablet (50 mg total) by mouth daily. 90 tablet 3  . nitroGLYCERIN (NITROSTAT) 0.4 MG SL tablet PLACE 1 TABLET UNDER THE TONGUE EVERY 5 MINUTES AS NEEDED FOR CHEST PAIN 25 tablet 2  . pantoprazole (PROTONIX) 40 MG tablet TAKE 1 TABLET BY MOUTH DAILY 90 tablet 0  . predniSONE (DELTASONE) 10 MG tablet Take 2 tablets (20 mg total) by mouth daily with breakfast. 60 tablet 0  . ramipril (ALTACE) 10 MG capsule TAKE 1 CAPSULE(10 MG) BY MOUTH DAILY 30 capsule  3  . rosuvastatin (CRESTOR) 5 MG tablet Take 1 tablet (5 mg total) by mouth daily. 90 tablet 2  . Thiamine HCl (VITAMIN B-1 PO) Take by mouth daily.    . traMADol-acetaminophen (ULTRACET) 37.5-325 MG tablet Take 1 tablet by mouth every 8 (eight) hours as needed. 12 tablet 0  . VITAMIN E PO Take by mouth daily.     No current facility-administered medications for this visit.     Allergies:   Ciprofloxacin, Codeine, Metronidazole, Other, Sulfa antibiotics, Neomycin, Neomycin-bacitracin zn-polymyx, Bacitracin-polymyxin b, Celecoxib, Petrolatum-zinc oxide, Statins, and Vioxx [rofecoxib]   Social History:  The patient  reports that she has never smoked. She has never used smokeless tobacco. She reports that she does not drink alcohol or use drugs.   Family History:   family history includes Heart disease in her sister.    Review of Systems:  Review of Systems  Constitutional: Negative.   HENT: Negative.   Respiratory: Negative.   Cardiovascular: Negative.        Rare episodes atypical chest pain  Gastrointestinal: Negative.   Musculoskeletal: Negative.   Neurological: Negative.   Psychiatric/Behavioral: Negative.   All other systems reviewed and are negative.   PHYSICAL EXAM: VS:  BP 134/60 (BP Location: Right Arm, Patient Position: Sitting, Cuff Size: Normal)   Pulse 72   Ht 5\' 2"  (1.575 m)   Wt 150 lb (68 kg)   BMI 27.44 kg/m  , BMI Body mass index is 27.44 kg/m. Constitutional:  oriented to person, place, and time. No distress.  HENT:  Head: Grossly normal Eyes:  no discharge. No scleral icterus.  Neck: No JVD, no carotid bruits  Cardiovascular: Regular rate and rhythm, no murmurs appreciated Pulmonary/Chest: Clear to auscultation bilaterally, no wheezes or rails Abdominal: Soft.  no distension.  no tenderness.  Musculoskeletal: Normal range of motion Neurological:  normal muscle tone. Coordination normal. No atrophy Skin: Skin warm and dry Psychiatric: normal affect,  pleasant  Recent Labs: 04/18/2018: ALT 15; BUN 30; Creatinine, Ser 0.99; Hemoglobin 10.4; Platelets 232; Potassium 3.9; Sodium 138; TSH 1.460    Lipid Panel Lab Results  Component Value Date   CHOL 151 08/14/2014   HDL 42 08/14/2014   LDLCALC 67 08/14/2014   TRIG 209 (H) 08/14/2014      Wt Readings from Last 3 Encounters:  06/27/18 150 lb (68 kg)  03/29/18 135 lb (61.2 kg)  12/14/17 135 lb (61.2 kg)       ASSESSMENT AND PLAN:  Near syncope No recent episodes of near syncope No further work-up  Coronary artery disease involving native coronary artery of native heart without angina pectoris Stop the imdur secondary to headaches Monitor blood pressure at home  Mixed hyperlipidemia Continue Crestor 5 mg daily Zetia 10 mg daily  Essential hypertension Stop imdur, has headache Monitor BP at home  Falls Several prior falls,  None recently  Type 2 diabetes mellitus with other circulatory complication, without long-term current use of insulin (HCC)  hemoglobin A1c 6.3 [place] of deserts  Anemia chronic issue Monitored by primary care    Total encounter time more than 25 minutes  Greater than 50% was spent in counseling and coordination of care with the patient   Disposition:   F/U  12 months   No orders of the defined types were placed in this encounter.    Signed, Esmond Plants, M.D., Ph.D. 06/27/2018  [place], [place] \

## 2018-06-27 ENCOUNTER — Other Ambulatory Visit: Payer: Self-pay

## 2018-06-27 ENCOUNTER — Ambulatory Visit (INDEPENDENT_AMBULATORY_CARE_PROVIDER_SITE_OTHER): Payer: Medicare Other | Admitting: Cardiovascular Disease

## 2018-06-27 VITALS — BP 134/60 | HR 72 | Ht 62.0 in | Wt 150.0 lb

## 2018-06-27 DIAGNOSIS — E782 Mixed hyperlipidemia: Secondary | ICD-10-CM

## 2018-06-27 DIAGNOSIS — E1159 Type 2 diabetes mellitus with other circulatory complications: Secondary | ICD-10-CM

## 2018-06-27 DIAGNOSIS — I25118 Atherosclerotic heart disease of native coronary artery with other forms of angina pectoris: Secondary | ICD-10-CM | POA: Diagnosis not present

## 2018-06-27 DIAGNOSIS — I10 Essential (primary) hypertension: Secondary | ICD-10-CM

## 2018-06-27 DIAGNOSIS — I6523 Occlusion and stenosis of bilateral carotid arteries: Secondary | ICD-10-CM | POA: Diagnosis not present

## 2018-06-27 DIAGNOSIS — R600 Localized edema: Secondary | ICD-10-CM

## 2018-06-27 DIAGNOSIS — R55 Syncope and collapse: Secondary | ICD-10-CM

## 2018-06-27 MED ORDER — FUROSEMIDE 20 MG PO TABS: 20.0000 mg | ORAL_TABLET | Freq: Every day | ORAL | 3 refills | Status: DC | PRN

## 2018-06-27 MED ORDER — POTASSIUM CHLORIDE ER 10 MEQ PO TBCR: 10.0000 meq | EXTENDED_RELEASE_TABLET | Freq: Every day | ORAL | 3 refills | Status: DC | PRN

## 2018-06-27 NOTE — Patient Instructions (Addendum)
Medication Instructions:  Your physician has recommended you make the following change in your medication:  1. START Furosemide (Lasix) 20 mg daily as needed for leg swelling. 2. START Potassium 10 mEq daily as needed with Furosemide 3. STOP Isosorbide mononitrate.   Your physician has requested that you regularly monitor and record your blood pressure readings at home. Please use the same machine at the same time of day to check your readings and record them to bring to your follow-up visit.  Please keep a log and review other information below on monitoring. Very useful tips.   If you need a refill on your cardiac medications before your next appointment, please call your pharmacy.    Lab work: No new labs needed   If you have labs (blood work) drawn today and your tests are completely normal, you will receive your results only by: Marland Kitchen MyChart Message (if you have MyChart) OR . A paper copy in the mail If you have any lab test that is abnormal or we need to change your treatment, we will call you to review the results.   Testing/Procedures: No new testing needed   Follow-Up: At [facility], you and your health needs are our priority.  As part of our continuing mission to provide you with exceptional heart care, we have created designated Provider Care Teams.  These Care Teams include your primary Cardiologist (physician) and Advanced Practice Providers (APPs -  Physician Assistants and Nurse Practitioners) who all work together to provide you with the care you need, when you need it.  . You will need a follow up appointment in 12 months .   Please call our office 2 months in advance to schedule this appointment.    . Providers on your designated Care Team:   . Murray Hodgkins, NP . Christell Faith, PA-C . Marrianne Mood, PA-C  Any Other Special Instructions Will Be Listed Below (If Applicable).  For educational health videos Log in to : [email or web address] Or : [email or web address],  password : [email or web address]   How to Take Your Blood Pressure You can take your blood pressure at home with a machine. You may need to check your blood pressure at home:  To check if you have high blood pressure (hypertension).  To check your blood pressure over time.  To make sure your blood pressure medicine is working. Supplies needed: You will need a blood pressure machine, or monitor. You can buy one at a drugstore or online. When choosing one:  Choose one with an arm cuff.  Choose one that wraps around your upper arm. Only one finger should fit between your arm and the cuff.  Do not choose one that measures your blood pressure from your wrist or finger. Your doctor can suggest a monitor. How to prepare Avoid these things for 30 minutes before checking your blood pressure:  Drinking caffeine.  Drinking alcohol.  Eating.  Smoking.  Exercising. Five minutes before checking your blood pressure:  Pee.  Sit in a dining chair. Avoid sitting in a soft couch or armchair.  Be quiet. Do not talk. How to take your blood pressure Follow the instructions that came with your machine. If you have a digital blood pressure monitor, these may be the instructions: 1. Sit up straight. 2. Place your feet on the floor. Do not cross your ankles or legs. 3. Rest your left arm at the level of your heart. You may rest it on a table, desk, or chair. 4. Pull up your  shirt sleeve. 5. Wrap the blood pressure cuff around the upper part of your left arm. The cuff should be 1 inch (2.5 cm) above your elbow. It is best to wrap the cuff around bare skin. 6. Fit the cuff snugly around your arm. You should be able to place only one finger between the cuff and your arm. 7. Put the cord inside the groove of your elbow. 8. Press the power button. 9. Sit quietly while the cuff fills with air and loses air. 10. Write down the numbers on the screen. 11. Wait 2-3 minutes and then repeat steps 1-10. What do the  numbers mean? Two numbers make up your blood pressure. The first number is called systolic pressure. The second is called diastolic pressure. An example of a blood pressure reading is "120 over 80" (or 120/80). If you are an adult and do not have a medical condition, use this guide to find out if your blood pressure is normal: Normal  First number: below 120.  Second number: below 80. Elevated  First number: 120-129.  Second number: below 80. Hypertension stage 1  First number: 130-139.  Second number: 80-89. Hypertension stage 2  First number: 140 or above.  Second number: 34 or above. Your blood pressure is above normal even if only the top or bottom number is above normal. Follow these instructions at home:  Check your blood pressure as often as your doctor tells you to.  Take your monitor to your next doctor's appointment. Your doctor will: ? Make sure you are using it correctly. ? Make sure it is working right.  Make sure you understand what your blood pressure numbers should be.  Tell your doctor if your medicines are causing side effects. Contact a doctor if:  Your blood pressure keeps being high. Get help right away if:  Your first blood pressure number is higher than 180.  Your second blood pressure number is higher than 120. This information is not intended to replace advice given to you by your health care provider. Make sure you discuss any questions you have with your health care provider. Document Released: 12/12/2007 Document Revised: 11/27/2015 Document Reviewed: 06/07/2015 Elsevier Interactive Patient Education  2019 [employer] Inc.  Blood Pressure Record Sheet To take your blood pressure, you will need a blood pressure machine. You can buy a blood pressure machine (blood pressure monitor) at your clinic, drug store, or online. When choosing one, consider:  An automatic monitor that has an arm cuff.  A cuff that wraps snugly around your upper arm.  You should be able to fit only one finger between your arm and the cuff.  A device that stores blood pressure reading results.  Do not choose a monitor that measures your blood pressure from your wrist or finger. Follow your health care provider's instructions for how to take your blood pressure. To use this form:  Get one reading in the morning (a.m.) before you take any medicines.  Get one reading in the evening (p.m.) before supper.  Take at least 2 readings with each blood pressure check. This makes sure the results are correct. Wait 1-2 minutes between measurements.  Write down the results in the spaces on this form.  Repeat this once a week, or as told by your health care provider.  Make a follow-up appointment with your health care provider to discuss the results. Blood pressure log Date: _______________________  a.m. _____________________(1st reading) _____________________(2nd reading)  p.m. _____________________(1st reading) _____________________(2nd reading) Date: _______________________  a.m. _____________________(1st reading) _____________________(2nd reading)  p.m. _____________________(1st reading) _____________________(2nd reading) Date: _______________________  a.m. _____________________(1st reading) _____________________(2nd reading)  p.m. _____________________(1st reading) _____________________(2nd reading) Date: _______________________  a.m. _____________________(1st reading) _____________________(2nd reading)  p.m. _____________________(1st reading) _____________________(2nd reading) Date: _______________________  a.m. _____________________(1st reading) _____________________(2nd reading)  p.m. _____________________(1st reading) _____________________(2nd reading) This information is not intended to replace advice given to you by your health care provider. Make sure you discuss any questions you have with your health care provider. Document Released:  09/27/2002 Document Revised: 12/29/2016 Document Reviewed: 12/29/2016 Elsevier Interactive Patient Education  2019 [employer].

## 2018-06-29 NOTE — Addendum Note (Signed)
Addended by: Janan Ridge on: 06/29/2018 10:56 AM   Modules accepted: Orders

## 2018-07-19 ENCOUNTER — Other Ambulatory Visit: Payer: Self-pay | Admitting: Internal Medicine

## 2018-07-19 DIAGNOSIS — Z9289 Personal history of other medical treatment: Secondary | ICD-10-CM

## 2018-07-21 ENCOUNTER — Other Ambulatory Visit (INDEPENDENT_AMBULATORY_CARE_PROVIDER_SITE_OTHER): Payer: Self-pay | Admitting: Orthopedic Surgery

## 2018-07-22 ENCOUNTER — Other Ambulatory Visit: Payer: Self-pay

## 2018-07-23 ENCOUNTER — Other Ambulatory Visit: Payer: Self-pay | Admitting: Cardiovascular Disease

## 2018-07-25 ENCOUNTER — Inpatient Hospital Stay: Payer: Medicare Other | Attending: Internal Medicine | Admitting: Internal Medicine

## 2018-07-25 ENCOUNTER — Other Ambulatory Visit: Payer: Self-pay

## 2018-07-25 DIAGNOSIS — Z79899 Other long term (current) drug therapy: Secondary | ICD-10-CM | POA: Diagnosis not present

## 2018-07-25 DIAGNOSIS — E039 Hypothyroidism, unspecified: Secondary | ICD-10-CM

## 2018-07-25 DIAGNOSIS — C73 Malignant neoplasm of thyroid gland: Secondary | ICD-10-CM

## 2018-07-25 DIAGNOSIS — D649 Anemia, unspecified: Secondary | ICD-10-CM | POA: Diagnosis not present

## 2018-07-25 NOTE — Progress Notes (Signed)
[facility] OFFICE PROGRESS NOTE  Patient Care Team: Idelle Crouch, MD as PCP - General (Internal Medicine) Minna Merritts, MD as Consulting Physician (Cardiology) Cammie Sickle, MD as Medical Oncologist (Medical Oncology)  Cancer Staging Thyroid cancer [facility]) Staging form: Thyroid - Papillary or Follicular, AJCC 7th Edition - Clinical stage from 03/22/2014: Stage III (T3, N0, M0) - Signed by Cammie Sickle, MD on 10/22/2015 Staging form: Thyroid - Papillary or Follicular (Under 45 years), AJCC 7th Edition - Pathologic: No stage assigned - Unsigned    Oncology History Overview Note  1. 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  0.5 cm tumor extending into perithyroid soft tissue status post total thyroidectomy;   One lymph node was negative.  No angiolymphatic invasion seen [Dr.McQueen]; Intermediate risk of recurrence- goal TSH 0.1- 0.5  # Status post radioactive iodine therapy in May of 2016.  # On Synthroid    Thyroid cancer [facility])      INTERVAL HISTORY:  [name] 85 y.o.  female pleasant patient above history of Stage III papillary thyroid carcinoma is here for follow-up.   Patient complains of chronic fatigue.  Otherwise denies any unusual shortness of breath or cough.  Denies any lumps in the neck; or in the underarms.  No tremors. No palpitations.  Review of Systems  Constitutional: Negative for chills, diaphoresis, fever, malaise/fatigue and weight loss.  HENT: Negative for nosebleeds and sore throat.   Eyes: Negative for double vision.  Respiratory: Negative for cough, hemoptysis, sputum production, shortness of breath and wheezing.   Cardiovascular: Negative for chest pain, palpitations, orthopnea and leg swelling.  Gastrointestinal: Negative for abdominal pain, blood in stool, constipation, diarrhea, heartburn, melena, nausea and vomiting.  Genitourinary: Negative for  dysuria, frequency and urgency.  Musculoskeletal: Negative for back pain and joint pain.  Skin: Negative.  Negative for itching and rash.  Neurological: Negative for dizziness, tingling, focal weakness, weakness and headaches.  Endo/Heme/Allergies: Does not bruise/bleed easily.  Psychiatric/Behavioral: Negative for depression. The patient is not nervous/anxious and does not have insomnia.      PAST MEDICAL HISTORY :  Past Medical History:  Diagnosis Date  . CAD (coronary artery disease)    a. lexiscan 08/2013: nl wall motion, no ischemia, EF 50-55%; b. cardiac cath 05/31/2014: mLAD 90%, dLAD 50%, dLCx 60%, 1st Mrg 80%, pRCA 60% s/p PCI/DES, mRCA 1st lesion 70%, mRCA 2nd 95% s/p PCI/long DES to cover entire mRCA, RPLB 40%;  c. 05/2014 Staged PCI of LAD w/ 2.5 x 15 mm Xience Alpine DES.  . Carotid artery disease ([place])   . Chronic anxiety   . Diverticulosis   . Esophagitis   . GERD (gastroesophageal reflux disease)   . History of colon polyps   . History of echocardiogram    a. 08/2013: normal LVSF, nl RVSF, no valvular regurgitation or stenosis   . Hyperlipidemia   . Hypertension   . Hypothyroidism   . Mild reactive airways disease   . Osteoarthritis   . PONV (postoperative nausea and vomiting)   . Rheumatoid arthritis ([place])   . Stroke [facility])    a. 05/2014 pt c/o garbled speech following cath 5/19. MRI/A 5/26 showed left caudate infarct->conservative mgmt per neuro.  . Thyroid cancer ([place])    a. s/p right sided hemi-thyroidectomy; b. planning for radioactive iodine tx; c. followed by Dr. Marcelene [place]te  . Type 2 diabetes mellitus (HCC)     PAST SURGICAL  HISTORY :   Past Surgical History:  Procedure Laterality Date  . ABDOMINAL HYSTERECTOMY    . APPENDECTOMY    . AUGMENTATION MAMMAPLASTY    . BACK SURGERY    . BREAST IMPLANT REMOVAL  06/2001  . CARDIAC CATHETERIZATION N/A 06/06/2014   Procedure: Coronary/Graft Atherectomy;  Surgeon: Wellington Hampshire, MD;  Location: [facility];   Service: Cardiovascular;  Laterality: N/A;  . CARDIAC CATHETERIZATION N/A 05/31/2014   Procedure: Left Heart Cath;  Surgeon: Minna Merritts, MD;  Location: [place] [facility];  Service: Cardiovascular;  Laterality: N/A;  . CARDIAC CATHETERIZATION N/A 05/31/2014   Procedure: Coronary Stent Intervention;  Surgeon: Wellington Hampshire, MD;  Location: [place] [facility];  Service: Cardiovascular;  Laterality: N/A;  . CARPAL TUNNEL RELEASE     "I've have a total of 7" (06/06/2014)  . CATARACT EXTRACTION W/ INTRAOCULAR LENS  IMPLANT, BILATERAL Bilateral   . CHOLECYSTECTOMY    . CORONARY ANGIOPLASTY WITH STENT PLACEMENT  05/31/2014; 06/06/2014   "2; 1"  . GANGLION CYST EXCISION Right    AC joint  . LUMBAR LAMINECTOMY  X 6  . MENISCUS REPAIR Bilateral    "2 on the right; 1 on the left"  . ROTATOR CUFF REPAIR     "I think 2 left, 2 right"  . THYROIDECTOMY    . TONSILLECTOMY    . TUBAL LIGATION      FAMILY HISTORY :   Family History  Problem Relation Age of Onset  . Heart disease Sister        stent placed x 3     SOCIAL HISTORY:   Social History   Tobacco Use  . Smoking status: Never Smoker  . Smokeless tobacco: Never Used  Substance Use Topics  . Alcohol use: No  . Drug use: No    ALLERGIES:  is allergic to ciprofloxacin; codeine; metronidazole; other; sulfa antibiotics; neomycin; neomycin-bacitracin zn-polymyx; bacitracin-polymyxin b; celecoxib; petrolatum-zinc oxide; statins; and vioxx [rofecoxib].  MEDICATIONS:  Current Outpatient Medications  Medication Sig Dispense Refill  . allopurinol (ZYLOPRIM) 100 MG tablet TAKE 1 TABLET(100 MG) BY MOUTH TWICE DAILY 60 tablet 3  . ALPRAZolam (XANAX) 0.25 MG tablet Take 0.25 mg by mouth at bedtime as needed for anxiety or sleep.     . Cholecalciferol 1000 UNITS tablet Take 1,000 Units by mouth daily.     . clopidogrel (PLAVIX) 75 MG tablet TAKE 1 TABLET DAILY 90 tablet 4  . Colchicine (MITIGARE) 0.6 MG CAPS Take 0.6 mg by mouth daily.  30 capsule 4  . furosemide (LASIX) 20 MG tablet Take 1 tablet (20 mg total) by mouth daily as needed (As needed for leg swelling take potassium with this.). 90 tablet 3  . levothyroxine (SYNTHROID) 88 MCG tablet TAKE 1 TABLET(88 MCG) BY MOUTH DAILY 90 tablet 0  . metFORMIN (GLUCOPHAGE) 500 MG tablet Take 500 mg by mouth 2 (two) times a day.    . metoprolol succinate (TOPROL XL) 50 MG 24 hr tablet Take 1 tablet (50 mg total) by mouth daily. 90 tablet 3  . Multiple Vitamins-Minerals (ICAPS AREDS 2 PO) Take by mouth.    . nitroGLYCERIN (NITROSTAT) 0.4 MG SL tablet PLACE 1 TABLET UNDER THE TONGUE EVERY 5 MINUTES AS NEEDED FOR CHEST PAIN 25 tablet 2  . pantoprazole (PROTONIX) 40 MG tablet TAKE 1 TABLET BY MOUTH DAILY 90 tablet 0  . potassium chloride (K-DUR) 10 MEQ tablet Take 1 tablet (10 mEq total) by mouth daily as  needed (As needed with Furosemide.). 90 tablet 3  . predniSONE (DELTASONE) 10 MG tablet Take 2 tablets (20 mg total) by mouth daily with breakfast. 60 tablet 0  . ramipril (ALTACE) 10 MG capsule TAKE 1 CAPSULE(10 MG) BY MOUTH DAILY 30 capsule 3  . rosuvastatin (CRESTOR) 5 MG tablet Take 1 tablet (5 mg total) by mouth daily. 90 tablet 2  . Thiamine HCl (VITAMIN B-1 PO) Take by mouth daily.    Marland Kitchen VITAMIN E PO Take by mouth daily.    Marland Kitchen ezetimibe (ZETIA) 10 MG tablet Take 1 tablet (10 mg total) by mouth daily. (Patient not taking: Reported on 07/25/2018) 90 tablet 3  . traMADol-acetaminophen (ULTRACET) 37.5-325 MG tablet Take 1 tablet by mouth every 8 (eight) hours as needed. (Patient not taking: Reported on 07/25/2018) 12 tablet 0   No current facility-administered medications for this visit.     PHYSICAL EXAMINATION: ECOG PERFORMANCE STATUS: 0 - Asymptomatic  BP 126/72   Pulse 73   Temp 97.7 F (36.5 C)   Resp 18   Wt 152 lb 8 oz (69.2 kg)   BMI 27.89 kg/m   Filed Weights   07/25/18 1335  Weight: 152 lb 8 oz (69.2 kg)    Physical Exam  Constitutional: She is oriented to  person, place, and time and well-developed, well-nourished, and in no distress.  HENT:  Head: Normocephalic and atraumatic.  Mouth/Throat: Oropharynx is clear and moist. No oropharyngeal exudate.  Eyes: Pupils are equal, round, and reactive to light.  Neck: Normal range of motion. Neck supple.  Cardiovascular: Normal rate and regular rhythm.  Pulmonary/Chest: No respiratory distress. She has no wheezes.  Abdominal: Soft. Bowel sounds are normal. She exhibits no distension and no mass. There is no abdominal tenderness. There is no rebound and no guarding.  Musculoskeletal: Normal range of motion.        General: No tenderness or edema.  Neurological: She is alert and oriented to person, place, and time.  Skin: Skin is warm.  Psychiatric: Affect normal.     LABORATORY DATA:  I have reviewed the data as listed    Component Value Date/Time   NA 138 04/18/2018 1336   NA 139 05/04/2014 1058   K 3.9 04/18/2018 1336   K 4.5 05/04/2014 1058   CL 107 04/18/2018 1336   CL 105 05/04/2014 1058   CO2 21 (L) 04/18/2018 1336   CO2 26 05/04/2014 1058   GLUCOSE 142 (H) 04/18/2018 1336   GLUCOSE 161 (H) 05/04/2014 1058   BUN 30 (H) 04/18/2018 1336   BUN 20 05/04/2014 1058   CREATININE 0.99 04/18/2018 1336   CREATININE 1.10 (H) 05/04/2014 1058   CALCIUM 8.9 04/18/2018 1336   CALCIUM 9.5 05/04/2014 1058   PROT 6.3 (L) 04/18/2018 1336   PROT 6.9 05/04/2014 1058   ALBUMIN 3.9 04/18/2018 1336   ALBUMIN 4.1 05/04/2014 1058   AST 23 04/18/2018 1336   AST 26 05/04/2014 1058   ALT 15 04/18/2018 1336   ALT 17 05/04/2014 1058   ALKPHOS 59 04/18/2018 1336   ALKPHOS 52 05/04/2014 1058   BILITOT 0.4 04/18/2018 1336   BILITOT 0.6 05/04/2014 1058   GFRNONAA 53 (L) 04/18/2018 1336   GFRNONAA 48 (L) 05/04/2014 1058   GFRAA >60 04/18/2018 1336   GFRAA 56 (L) 05/04/2014 1058    No results found for: SPEP, UPEP  Lab Results  Component Value Date   WBC 10.7 (H) 04/18/2018   NEUTROABS 9.3 (H)  04/18/2018  HGB 10.4 (L) 04/18/2018   HCT 32.4 (L) 04/18/2018   MCV 92.3 04/18/2018   PLT 232 04/18/2018      Chemistry      Component Value Date/Time   NA 138 04/18/2018 1336   NA 139 05/04/2014 1058   K 3.9 04/18/2018 1336   K 4.5 05/04/2014 1058   CL 107 04/18/2018 1336   CL 105 05/04/2014 1058   CO2 21 (L) 04/18/2018 1336   CO2 26 05/04/2014 1058   BUN 30 (H) 04/18/2018 1336   BUN 20 05/04/2014 1058   CREATININE 0.99 04/18/2018 1336   CREATININE 1.10 (H) 05/04/2014 1058      Component Value Date/Time   CALCIUM 8.9 04/18/2018 1336   CALCIUM 9.5 05/04/2014 1058   ALKPHOS 59 04/18/2018 1336   ALKPHOS 52 05/04/2014 1058   AST 23 04/18/2018 1336   AST 26 05/04/2014 1058   ALT 15 04/18/2018 1336   ALT 17 05/04/2014 1058   BILITOT 0.4 04/18/2018 1336   BILITOT 0.6 05/04/2014 1058       RADIOGRAPHIC STUDIES: I have personally reviewed the radiological images as listed and agreed with the findings in the report. No results found.   ASSESSMENT & PLAN:  Thyroid cancer ([place]) Thyroid cancer- stage III status post resection followed by radioactive iodine;on synthroid.  Clinically stable.  #Clinically no evidence of recurrence.;  Thyroglobulin levels normal.  # Hypothyroidism /TSH suppression -currently on Synthoid 88 mcg [Goal: TSH for intermediate risk- 0.1-0.5]-given age/comorbidities recommend normal TSH.  TSH from today pending  # Mild anemia hemoglobin 10.5 chronic-stable; iron studies- N.  Unclear etiology; discussed regarding bone marrow biopsy however since clinical stability/age/covid pandemic would recommend continued surveillance at this time.  Patient in agreement.   # DISPOSITION:  # follow up in  85month- MD/labs-CBC/CMP/Thyroid profile/ thyroglobulin by RIA; Dr.B   Orders Placed This Encounter  Procedures  . CBC with Differential/Platelet    Standing Status:   Future    Standing Expiration Date:   08/29/2019  . Comprehensive metabolic panel     Standing Status:   Future    Standing Expiration Date:   08/29/2019  . TgAb+Thyroglobulin IMA or RIA    Standing Status:   Future    Standing Expiration Date:   08/29/2019  . Thyroid Panel With TSH    Standing Status:   Future    Standing Expiration Date:   08/29/2019   All questions were answered. The patient knows to call the clinic with any problems, questions or concerns.      GCammie Sickle MD 07/25/2018 7:40 PM

## 2018-07-25 NOTE — Assessment & Plan Note (Addendum)
Thyroid cancer- stage III status post resection followed by radioactive iodine;on synthroid.  Clinically stable.  #Clinically no evidence of recurrence.;  Thyroglobulin levels normal.  # Hypothyroidism /TSH suppression -currently on Synthoid 88 mcg [Goal: TSH for intermediate risk- 0.1-0.5]-given age/comorbidities recommend normal TSH.  TSH from today pending  # Mild anemia hemoglobin 10.5 chronic-stable; iron studies- N.  Unclear etiology; discussed regarding bone marrow biopsy however since clinical stability/age/covid pandemic would recommend continued surveillance at this time.  Patient in agreement.   # DISPOSITION:  # follow up in  37month- MD/labs-CBC/CMP/Thyroid profile/ thyroglobulin by RIA; Dr.B

## 2018-07-25 NOTE — Progress Notes (Signed)
Patient had her labs drawn on 04/18/2018.  Was scheduled for f/u with MD on 04/25/2018 but was cancelled due to COVID-19.  Reports that her energy level is very very low.

## 2018-08-20 ENCOUNTER — Other Ambulatory Visit: Payer: Self-pay | Admitting: Nurse Practitioner

## 2018-08-20 DIAGNOSIS — C73 Malignant neoplasm of thyroid gland: Secondary | ICD-10-CM

## 2018-08-23 ENCOUNTER — Other Ambulatory Visit: Payer: Self-pay | Admitting: *Deleted

## 2018-08-23 DIAGNOSIS — C73 Malignant neoplasm of thyroid gland: Secondary | ICD-10-CM

## 2018-08-23 MED ORDER — LEVOTHYROXINE SODIUM 88 MCG PO TABS: 88.0000 ug | ORAL_TABLET | Freq: Every day | ORAL | 0 refills | Status: DC

## 2018-08-23 NOTE — Telephone Encounter (Signed)
Thyroglobulin by IMA Order: [id number] Status:  Final result Visible to patient:  No (not released) Next appt:  09/09/2018 at 02:40 PM in Radiology (GI-BCG MM 2)  Ref Range & Units 16mo ago 67mo ago 62yr ago  Thyroglobulin by IMA 1.5 - 38.5 ng/mL <0.1Low   <0.1Low  CM  0.1Low  CM   Comment: (NOTE)  According to the [facility] of Clinical Biochemistry, the  reference interval for Thyroglobulin (TG) should be related to  euthyroid patients and not for patients who underwent  thyroidectomy. TG reference intervals for these patients depend on  the residual mass of the thyroid tissue left after surgery.  Establishing a post-operative baseline is recommended.  The assay limit of quantitation is 0.1 ng/mL  Thyroglobulin measured by [facility] Immunometric Assay  Performed At: [facility]  [address] [place], [place] [id number]  Rush Farmer MD UX:[phone]   Resulting Agency  [facility] CLIN LAB [facility] CLIN LAB [facility] CLIN LAB      Specimen Collected: 04/18/18 13:36 Last Resulted: 04/19/18 07:37     Lab Flowsheet   Order Details   View Encounter   Lab and Collection Details   Routing   Result History     CM=Additional comments      Other Results from 04/18/2018  Contains abnormal data Comprehensive metabolic panel Order: [id number]  Status:  Final result Visible to patient:  No (not released) Next appt:  09/09/2018 at 02:40 PM in Radiology (GI-BCG MM 2) Dx:  Thyroid cancer ([place])  Ref Range & Units 38mo ago 53mo ago 5mo ago  Sodium 135 - 145 mmol/L 138  138  139   Potassium 3.5 - 5.1 mmol/L 3.9  4.3  4.1   Chloride 98 - 111 mmol/L 107  105  108   CO2 22 - 32 mmol/L 21Low   22  24   Glucose, Bld 70 - 99 mg/dL 142High   192High   110High    BUN 8 - 23 mg/dL 30High   34High   29High    Creatinine, Ser 0.44 - 1.00 mg/dL 0.99  0.97  0.79   Calcium 8.9 - 10.3 mg/dL 8.9  9.1  8.9   Total Protein 6.5 - 8.1 g/dL 6.3Low   6.1Low     Albumin 3.5 - 5.0 g/dL 3.9  3.8     AST 15 - 41 U/L 23  24    ALT 0 - 44 U/L 15  16    Alkaline Phosphatase 38 - 126 U/L 59  59    Total Bilirubin 0.3 - 1.2 mg/dL 0.4  0.4    GFR calc non Af Amer >60 mL/min 53Low   53Low   >60   GFR calc Af Amer >60 mL/min >60  >60 CM  >60 CM   Anion gap 5 - 15 10  11  CM  7 CM   Comment: Performed at [facility], [address] [place], [place] [address]  Resulting Agency  [facility] CLIN LAB [facility] CLIN LAB [facility] CLIN LAB      Specimen Collected: 04/18/18 13:36 Last Resulted: 04/18/18 14:09     Lab Flowsheet   Order Details   View Encounter   Lab and Collection Details   Routing   Result History     CM=Additional comments        Contains abnormal data CBC with Differential/Platelet Order: [id number]  Status:  Final result Visible to patient:  No (not released) Next appt:  09/09/2018 at 02:40 PM  in Radiology (GI-BCG MM 2) Dx:  Thyroid cancer ([place])  Ref Range & Units 55mo ago 83mo ago 22mo ago  WBC 4.0 - 10.5 K/uL 10.7High   12.1High   5.4 R   RBC 3.87 - 5.11 MIL/uL 3.51Low   3.78Low   3.45Low  R   Hemoglobin 12.0 - 15.0 g/dL 10.4Low   10.9Low   10.8Low  R   HCT 36.0 - 46.0 % 32.4Low   34.5Low   31.2Low  R   MCV 80.0 - 100.0 fL 92.3  91.3  90.5   MCH 26.0 - 34.0 pg 29.6  28.8  31.2   MCHC 30.0 - 36.0 g/dL 32.1  31.6  34.5 R   RDW 11.5 - 15.5 % 15.0  15.1  15.6High  R   Platelets 150 - 400 K/uL 232  212  184 R, CM   nRBC 0.0 - 0.2 % 0.0  0.0    Neutrophils Relative % % 87  88    Neutro Abs 1.7 - 7.7 K/uL 9.3High   10.7High     Lymphocytes Relative % 8  8    Lymphs Abs 0.7 - 4.0 K/uL 0.9  0.9    Monocytes Relative % 4  3    Monocytes Absolute 0.1 - 1.0 K/uL 0.4  0.3    Eosinophils Relative % 0  0    Eosinophils Absolute 0.0 - 0.5 K/uL 0.0  0.0    Basophils Relative % 0  0    Basophils Absolute 0.0 - 0.1 K/uL 0.0  0.0    Immature Granulocytes % 1  1    Abs Immature Granulocytes 0.00 - 0.07 K/uL 0.07  0.15High  CM    Comment: Performed at [facility], [address] [place], [place] [address]  Resulting Agency  [facility] CLIN LAB [facility] CLIN LAB [facility] CLIN LAB      Specimen Collected: 04/18/18 13:36 Last Resulted: 04/18/18 13:56     Lab Flowsheet   Order Details   View Encounter   Lab and Collection Details   Routing   Result History     CM=Additional commentsR=Reference range differs from displayed range        Thyroid Panel With TSH Order: [id number]  Status:  Final result Visible to patient:  No (not released) Next appt:  09/09/2018 at 02:40 PM in Radiology (GI-BCG MM 2) Dx:  Thyroid cancer ([place])  Ref Range & Units 33mo ago 62mo ago 66yr ago  TSH 0.450 - 4.500 uIU/mL 1.460  1.490  3.690   T4, Total 4.5 - 12.0 ug/dL 7.9  7.6  7.5   T3 Uptake Ratio 24 - 39 % 28  31  25    Free Thyroxine Index 1.2 - 4.9 2.2  2.4 CM  1.9 CM   Comment: (NOTE)  Performed At: [facility]  [address] [place], [place] [id number]  Rush Farmer MD PH:[phone]   Resulting Agency  [facility] CLIN LAB [facility] CLIN LAB [facility] CLIN LAB      Specimen Collected: 04/18/18 13:36 Last Resulted: 04/19/18 06:36     Lab Flowsheet   Order Details   View Encounter   Lab and Collection Details   Routing   Result History     CM=Additional comments        TgAb+Thyroglobulin IMA or RIA Order: [id number]  Status:  Final result Visible to patient:  No (not released) Next appt:  09/09/2018 at 02:40 PM in Radiology (GI-BCG MM 2) Dx:  Thyroid cancer (  [place])  Ref Range & Units 88mo ago 18mo ago 54yr ago  Thyroglobulin Antibody 0.0 - 0.9 IU/mL <1.0  <1.0 CM  <1.0 CM   Comment: (NOTE)  Thyroglobulin Antibody measured by [facility] Methodology  Performed At: [facility]  [address] [place], [place] [id number]  Rush Farmer MD JZ:[phone]   Resulting Agency  [facility] CLIN LAB [facility] CLIN LAB [facility] CLIN LAB      Specimen Collected: 04/18/18 13:36 Last Resulted: 04/19/18 07:36

## 2018-09-02 ENCOUNTER — Emergency Department
Admission: EM | Admit: 2018-09-02 | Discharge: 2018-09-02 | Disposition: A | Discharge status: Home / Self Care | Payer: Medicare Other | Attending: Emergency Medicine | Admitting: Emergency Medicine

## 2018-09-02 ENCOUNTER — Other Ambulatory Visit: Payer: Self-pay

## 2018-09-02 DIAGNOSIS — Z7984 Long term (current) use of oral hypoglycemic drugs: Secondary | ICD-10-CM | POA: Diagnosis not present

## 2018-09-02 DIAGNOSIS — I2510 Atherosclerotic heart disease of native coronary artery without angina pectoris: Secondary | ICD-10-CM | POA: Diagnosis not present

## 2018-09-02 DIAGNOSIS — Y93G1 Activity, food preparation and clean up: Secondary | ICD-10-CM | POA: Diagnosis not present

## 2018-09-02 DIAGNOSIS — T24111A Burn of first degree of right thigh, initial encounter: Secondary | ICD-10-CM | POA: Diagnosis not present

## 2018-09-02 DIAGNOSIS — Y929 Unspecified place or not applicable: Secondary | ICD-10-CM | POA: Diagnosis not present

## 2018-09-02 DIAGNOSIS — Y998 Other external cause status: Secondary | ICD-10-CM | POA: Diagnosis not present

## 2018-09-02 DIAGNOSIS — E119 Type 2 diabetes mellitus without complications: Secondary | ICD-10-CM | POA: Diagnosis not present

## 2018-09-02 DIAGNOSIS — Z8673 Personal history of transient ischemic attack (TIA), and cerebral infarction without residual deficits: Secondary | ICD-10-CM | POA: Diagnosis not present

## 2018-09-02 DIAGNOSIS — Z79899 Other long term (current) drug therapy: Secondary | ICD-10-CM | POA: Insufficient documentation

## 2018-09-02 DIAGNOSIS — E039 Hypothyroidism, unspecified: Secondary | ICD-10-CM | POA: Diagnosis not present

## 2018-09-02 DIAGNOSIS — Z955 Presence of coronary angioplasty implant and graft: Secondary | ICD-10-CM | POA: Diagnosis not present

## 2018-09-02 DIAGNOSIS — X100XXA Contact with hot drinks, initial encounter: Secondary | ICD-10-CM | POA: Insufficient documentation

## 2018-09-02 DIAGNOSIS — Z85850 Personal history of malignant neoplasm of thyroid: Secondary | ICD-10-CM | POA: Insufficient documentation

## 2018-09-02 DIAGNOSIS — T24011A Burn of unspecified degree of right thigh, initial encounter: Secondary | ICD-10-CM | POA: Diagnosis present

## 2018-09-02 DIAGNOSIS — T300 Burn of unspecified body region, unspecified degree: Secondary | ICD-10-CM

## 2018-09-02 MED ORDER — SILVER SULFADIAZINE 1 % EX CREA: 1.0000 "application " | TOPICAL_CREAM | Freq: Every day | CUTANEOUS | 0 refills | Status: DC
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
## 2018-09-02 NOTE — ED Provider Notes (Signed)
[facility] Emergency Department Provider Note  ____________________________________________  Time seen: Approximately 8:38 PM  I have reviewed the triage vital signs and the nursing notes.   HISTORY  Chief Complaint Burn    HPI [name] is a 85 y.o. female presents to the emergency department with a first-degree burn to the posterior aspect of patient's right thigh.  Patient has a proximately 6 cm in length by 3 cm in width region of superficial erythema from spilled coffee.  Patient has no blister formation.  She reports that affected area feels uncomfortable but is not particularly painful.  No other alleviating measures have been attempted at home.         Past Medical History:  Diagnosis Date  . CAD (coronary artery disease)    a. lexiscan 08/2013: nl wall motion, no ischemia, EF 50-55%; b. cardiac cath 05/31/2014: mLAD 90%, dLAD 50%, dLCx 60%, 1st Mrg 80%, pRCA 60% s/p PCI/DES, mRCA 1st lesion 70%, mRCA 2nd 95% s/p PCI/long DES to cover entire mRCA, RPLB 40%;  c. 05/2014 Staged PCI of LAD w/ 2.5 x 15 mm Xience Alpine DES.  . Carotid artery disease ([place])   . Chronic anxiety   . Diverticulosis   . Esophagitis   . GERD (gastroesophageal reflux disease)   . History of colon polyps   . History of echocardiogram    a. 08/2013: normal LVSF, nl RVSF, no valvular regurgitation or stenosis   . Hyperlipidemia   . Hypertension   . Hypothyroidism   . Mild reactive airways disease   . Osteoarthritis   . PONV (postoperative nausea and vomiting)   . Rheumatoid arthritis ([place])   . Stroke [facility])    a. 05/2014 pt c/o garbled speech following cath 5/19. MRI/A 5/26 showed left caudate infarct->conservative mgmt per neuro.  . Thyroid cancer ([place])    a. s/p right sided hemi-thyroidectomy; b. planning for radioactive iodine tx; c. followed by Dr. Marcelene [place]  . Type 2 diabetes mellitus [facility])     Patient Active Problem List   Diagnosis Date Noted  . Achilles tendon  contracture, bilateral 06/29/2017  . Acute bilateral low back pain without sciatica 03/17/2016  . Idiopathic chronic gout of multiple sites without tophus 03/17/2016  . Idiopathic gout of multiple sites 11/27/2015  . Cellulitis 05/15/2015  . Bilateral leg edema 02/08/2015  . Chronic anxiety 11/22/2014  . Cerebrovascular accident (CVA) ([place]) 07/25/2014  . Arterial vascular disease 07/25/2014  . Swelling of right lower extremity 06/22/2014  . Diabetes mellitus ([place])   . Stroke ([place]) 06/08/2014  . Anemia 06/07/2014  . Unstable angina ([place]) 06/06/2014  . Carotid artery disease ([place])   . Atherosclerosis of native coronary artery with stable angina pectoris ([place])   . Hyperlipidemia   . Essential hypertension   . Angina pectoris ([place]) 05/31/2014  . SOB (shortness of breath) 04/11/2014  . Palpitations 04/11/2014  . Other fatigue 04/11/2014  . Thyroid cancer ([place]) 04/11/2014    Past Surgical History:  Procedure Laterality Date  . ABDOMINAL HYSTERECTOMY    . APPENDECTOMY    . AUGMENTATION MAMMAPLASTY    . BACK SURGERY    . BREAST IMPLANT REMOVAL  06/2001  . CARDIAC CATHETERIZATION N/A 06/06/2014   Procedure: Coronary/Graft Atherectomy;  Surgeon: Wellington Hampshire, MD;  Location: [place] [facility];  Service: Cardiovascular;  Laterality: N/A;  . CARDIAC CATHETERIZATION N/A 05/31/2014   Procedure: Left Heart Cath;  Surgeon: Minna Merritts, MD;  Location: [place] [facility];  Service: Cardiovascular;  Laterality: N/A;  . CARDIAC CATHETERIZATION N/A 05/31/2014   Procedure: Coronary Stent Intervention;  Surgeon: Wellington Hampshire, MD;  Location: [place] [facility];  Service: Cardiovascular;  Laterality: N/A;  . CARPAL TUNNEL RELEASE     "I've have a total of 7" (06/06/2014)  . CATARACT EXTRACTION W/ INTRAOCULAR LENS  IMPLANT, BILATERAL Bilateral   . CHOLECYSTECTOMY    . CORONARY ANGIOPLASTY WITH STENT PLACEMENT  05/31/2014; 06/06/2014   "2; 1"  . GANGLION CYST EXCISION Right    AC joint   . LUMBAR LAMINECTOMY  X 6  . MENISCUS REPAIR Bilateral    "2 on the right; 1 on the left"  . ROTATOR CUFF REPAIR     "I think 2 left, 2 right"  . THYROIDECTOMY    . TONSILLECTOMY    . TUBAL LIGATION      Prior to Admission medications   Medication Sig Start Date End Date Taking? Authorizing Provider  allopurinol (ZYLOPRIM) 100 MG tablet TAKE 1 TABLET(100 MG) BY MOUTH TWICE DAILY 07/21/18   Newt Minion, MD  ALPRAZolam Duanne Moron) 0.25 MG tablet Take 0.25 mg by mouth at bedtime as needed for anxiety or sleep.  08/23/13   [provider]  Cholecalciferol 1000 UNITS tablet Take 1,000 Units by mouth daily.     [provider]  clopidogrel (PLAVIX) 75 MG tablet TAKE 1 TABLET DAILY 03/07/18   Minna Merritts, MD  Colchicine (MITIGARE) 0.6 MG CAPS Take 0.6 mg by mouth daily. 12/14/17   Rayburn, Neta Mends, PA-C  ezetimibe (ZETIA) 10 MG tablet Take 1 tablet (10 mg total) by mouth daily. Patient not taking: Reported on 07/25/2018 11/12/17   Minna Merritts, MD  furosemide (LASIX) 20 MG tablet Take 1 tablet (20 mg total) by mouth daily as needed (As needed for leg swelling take potassium with this.). 06/27/18 09/25/18  Minna Merritts, MD  levothyroxine (SYNTHROID) 88 MCG tablet TAKE 1 TABLET(88 MCG) BY MOUTH DAILY 08/23/18   Verlon Au, NP  levothyroxine (SYNTHROID) 88 MCG tablet Take 1 tablet (88 mcg total) by mouth daily before breakfast. 08/23/18   Cammie Sickle, MD  metFORMIN (GLUCOPHAGE) 500 MG tablet Take 500 mg by mouth 2 (two) times a day. 05/10/18   [provider]  metoprolol succinate (TOPROL XL) 50 MG 24 hr tablet Take 1 tablet (50 mg total) by mouth daily. 05/13/15   Minna Merritts, MD  Multiple Vitamins-Minerals (ICAPS AREDS 2 PO) Take by mouth.    [provider]  nitroGLYCERIN (NITROSTAT) 0.4 MG SL tablet PLACE 1 TABLET UNDER THE TONGUE EVERY 5 MINUTES AS NEEDED FOR CHEST PAIN 10/01/15   Minna Merritts, MD  pantoprazole (PROTONIX)  40 MG tablet TAKE 1 TABLET BY MOUTH DAILY 07/25/18   Minna Merritts, MD  potassium chloride (K-DUR) 10 MEQ tablet Take 1 tablet (10 mEq total) by mouth daily as needed (As needed with Furosemide.). 06/27/18 09/25/18  Minna Merritts, MD  predniSONE (DELTASONE) 10 MG tablet Take 2 tablets (20 mg total) by mouth daily with breakfast. 03/29/18   Newt Minion, MD  ramipril (ALTACE) 10 MG capsule TAKE 1 CAPSULE(10 MG) BY MOUTH DAILY 05/13/15   Minna Merritts, MD  rosuvastatin (CRESTOR) 5 MG tablet Take 1 tablet (5 mg total) by mouth daily. 04/28/18   Minna Merritts, MD  silver sulfADIAZINE (SILVADENE) 1 % cream Apply 1 application topically daily. 09/02/18   Lannie Fields, PA-C  Thiamine  HCl (VITAMIN B-1 PO) Take by mouth daily.    [provider]  traMADol-acetaminophen (ULTRACET) 37.5-325 MG tablet Take 1 tablet by mouth every 8 (eight) hours as needed. Patient not taking: Reported on 07/25/2018 12/08/17   Sable Feil, PA-C  VITAMIN E PO Take by mouth daily.    [provider]    Allergies Ciprofloxacin, Codeine, Metronidazole, Other, Sulfa antibiotics, Neomycin, Neomycin-bacitracin zn-polymyx, Bacitracin-polymyxin b, Celecoxib, Petrolatum-zinc oxide, Statins, and Vioxx [rofecoxib]  Family History  Problem Relation Age of Onset  . Heart disease Sister        stent placed x 3     Social History Social History   Tobacco Use  . Smoking status: Never Smoker  . Smokeless tobacco: Never Used  Substance Use Topics  . Alcohol use: No  . Drug use: No     Review of Systems  Constitutional: No fever/chills Eyes: No visual changes. No discharge ENT: No upper respiratory complaints. Cardiovascular: no chest pain. Respiratory: no cough. No SOB. Gastrointestinal: No abdominal pain.  No nausea, no vomiting.  No diarrhea.  No constipation. Musculoskeletal: Negative for musculoskeletal pain. Skin: Patient has first-degree burn. Neurological: Negative for headaches,  focal weakness or numbness.   ____________________________________________   PHYSICAL EXAM:  VITAL SIGNS: ED Triage Vitals [09/02/18 2019]  Enc Vitals Group     BP (!) 171/70     Pulse Rate 100     Resp 20     Temp      Temp Source Oral     SpO2 98 %     Weight 148 lb (67.1 kg)     Height 5\' 2"  (1.575 m)     Head Circumference      Peak Flow      Pain Score 6     Pain Loc      Pain Edu?      Excl. in [place]?      Constitutional: Alert and oriented. Well appearing and in no acute distress. Eyes: Conjunctivae are normal. PERRL. EOMI. Head: Atraumatic. Cardiovascular: Normal rate, regular rhythm. Normal S1 and S2.  Good peripheral circulation. Respiratory: Normal respiratory effort without tachypnea or retractions. Lungs CTAB. Good air entry to the bases with no decreased or absent breath sounds. Musculoskeletal: Full range of motion to all extremities. No gross deformities appreciated. Neurologic:  Normal speech and language. No gross focal neurologic deficits are appreciated.  Skin: Patient has first-degree burn along the posterior aspect of the right thigh approximately 6 cm in length by 3 cm in width with no overlying blister formation. Psychiatric: Mood and affect are normal. Speech and behavior are normal. Patient exhibits appropriate insight and judgement.   ____________________________________________   LABS (all labs ordered are listed, but only abnormal results are displayed)  Labs Reviewed - No data to display ____________________________________________  EKG   ____________________________________________  RADIOLOGY  No results found.  ____________________________________________    PROCEDURES  Procedure(s) performed:    Procedures    Medications - No data to display   ____________________________________________   INITIAL IMPRESSION / ASSESSMENT AND PLAN / ED COURSE  Pertinent labs & imaging results that were available during my care  of the patient were reviewed by me and considered in my medical decision making (see chart for details).  Review of the [place] CSRS was performed in accordance of the [place] prior to dispensing any controlled drugs.         Assessment and plan First-degree burn 85 year old female presents to the emergency  department with a first-degree burn along the posterior aspect of the right thigh after spilling coffee on herself.  Patient was hypertensive at triage but vital signs were otherwise reassuring.  Patient has a 6 cm in length by 3 cm in width region of superficial erythema along the posterior aspect of the right thigh without overlying blister formation.  Patient was discharged with Silvadene.  I offered to prescribe patient pain medication to help with discomfort and she refused stating that Silvadene would be enough.  She was advised to follow-up with primary care as needed.  All patient questions were answered.  ____________________________________________  FINAL CLINICAL IMPRESSION(S) / ED DIAGNOSES  Final diagnoses:  First degree burn      NEW MEDICATIONS STARTED DURING THIS VISIT:  ED Discharge Orders         Ordered    silver sulfADIAZINE (SILVADENE) 1 % cream  Daily     09/02/18 2035              This chart was dictated using voice recognition software/Dragon. Despite best efforts to proofread, errors can occur which can change the meaning. Any change was purely unintentional.    Karren Cobble 09/02/18 2042    Nance Pear, MD 09/02/18 2125

## 2018-09-08 ENCOUNTER — Ambulatory Visit: Payer: Medicare Other | Admitting: Nurse Practitioner

## 2018-09-09 ENCOUNTER — Ambulatory Visit
Admission: RE | Admit: 2018-09-09 | Discharge: 2018-09-09 | Disposition: A | Discharge status: Home / Self Care | Payer: Medicare Other | Source: Ambulatory Visit | Attending: Internal Medicine | Admitting: Internal Medicine

## 2018-09-09 ENCOUNTER — Other Ambulatory Visit: Payer: Self-pay

## 2018-09-09 DIAGNOSIS — Z9289 Personal history of other medical treatment: Secondary | ICD-10-CM

## 2018-09-29 ENCOUNTER — Ambulatory Visit: Payer: Self-pay | Admitting: Orthopedic Surgery

## 2018-10-05 ENCOUNTER — Other Ambulatory Visit: Payer: Self-pay

## 2018-10-05 MED ORDER — ROSUVASTATIN CALCIUM 5 MG PO TABS: 5.0000 mg | ORAL_TABLET | Freq: Every day | ORAL | 0 refills | Status: DC

## 2018-10-11 ENCOUNTER — Ambulatory Visit: Payer: Medicare Other | Admitting: Orthopedic Surgery

## 2018-10-11 ENCOUNTER — Ambulatory Visit: Payer: Medicare Other | Admitting: Family

## 2018-10-18 ENCOUNTER — Ambulatory Visit: Payer: Medicare Other | Admitting: Orthopedic Surgery

## 2018-10-26 ENCOUNTER — Other Ambulatory Visit: Payer: Self-pay | Admitting: Cardiovascular Disease

## 2018-11-09 ENCOUNTER — Other Ambulatory Visit: Payer: Self-pay

## 2018-11-09 ENCOUNTER — Emergency Department: Payer: Medicare Other

## 2018-11-09 ENCOUNTER — Emergency Department
Admission: EM | Admit: 2018-11-09 | Discharge: 2018-11-09 | Disposition: A | Discharge status: Home / Self Care | Payer: Medicare Other | Attending: Emergency Medicine | Admitting: Emergency Medicine

## 2018-11-09 DIAGNOSIS — Z8673 Personal history of transient ischemic attack (TIA), and cerebral infarction without residual deficits: Secondary | ICD-10-CM | POA: Diagnosis not present

## 2018-11-09 DIAGNOSIS — E039 Hypothyroidism, unspecified: Secondary | ICD-10-CM | POA: Insufficient documentation

## 2018-11-09 DIAGNOSIS — Z79899 Other long term (current) drug therapy: Secondary | ICD-10-CM | POA: Diagnosis not present

## 2018-11-09 DIAGNOSIS — E119 Type 2 diabetes mellitus without complications: Secondary | ICD-10-CM | POA: Diagnosis not present

## 2018-11-09 DIAGNOSIS — I10 Essential (primary) hypertension: Secondary | ICD-10-CM | POA: Diagnosis not present

## 2018-11-09 DIAGNOSIS — L03115 Cellulitis of right lower limb: Secondary | ICD-10-CM | POA: Insufficient documentation

## 2018-11-09 DIAGNOSIS — R2241 Localized swelling, mass and lump, right lower limb: Secondary | ICD-10-CM | POA: Insufficient documentation

## 2018-11-09 DIAGNOSIS — Z85850 Personal history of malignant neoplasm of thyroid: Secondary | ICD-10-CM | POA: Diagnosis not present

## 2018-11-09 DIAGNOSIS — Z7984 Long term (current) use of oral hypoglycemic drugs: Secondary | ICD-10-CM | POA: Diagnosis not present

## 2018-11-09 DIAGNOSIS — M25571 Pain in right ankle and joints of right foot: Secondary | ICD-10-CM | POA: Diagnosis present

## 2018-11-09 LAB — COMPREHENSIVE METABOLIC PANEL
ALT: 15 U/L (ref 0–44)
AST: 22 U/L (ref 15–41)
Albumin: 4 g/dL (ref 3.5–5.0)
Alkaline Phosphatase: 70 U/L (ref 38–126)
Anion gap: 10 (ref 5–15)
BUN: 27 mg/dL — ABNORMAL HIGH (ref 8–23)
CO2: 22 mmol/L (ref 22–32)
Calcium: 9 mg/dL (ref 8.9–10.3)
Chloride: 108 mmol/L (ref 98–111)
Creatinine, Ser: 0.87 mg/dL (ref 0.44–1.00)
GFR calc Af Amer: >60 mL/min (ref >60)
GFR calc non Af Amer: >60 mL/min (ref >60)
Glucose, Bld: 116 mg/dL — ABNORMAL HIGH (ref 70–99)
Potassium: 3.7 mmol/L (ref 3.5–5.1)
Sodium: 140 mmol/L (ref 135–145)
Total Bilirubin: 0.4 mg/dL (ref 0.3–1.2)
Total Protein: 6.7 g/dL (ref 6.5–8.1)

## 2018-11-09 LAB — CBC WITH DIFFERENTIAL/PLATELET
Abs Immature Granulocytes: 0.01 10*3/uL (ref 0.00–0.07)
Basophils Absolute: 0 10*3/uL (ref 0.0–0.1)
Basophils Relative: 1 %
Eosinophils Absolute: 0.4 10*3/uL (ref 0.0–0.5)
Eosinophils Relative: 6 %
HCT: 33.5 % — ABNORMAL LOW (ref 36.0–46.0)
Hemoglobin: 10.6 g/dL — ABNORMAL LOW (ref 12.0–15.0)
Immature Granulocytes: 0 %
Lymphocytes Relative: 17 %
Lymphs Abs: 1 10*3/uL (ref 0.7–4.0)
MCH: 29.5 pg (ref 26.0–34.0)
MCHC: 31.6 g/dL (ref 30.0–36.0)
MCV: 93.3 fL (ref 80.0–100.0)
Monocytes Absolute: 0.5 10*3/uL (ref 0.1–1.0)
Monocytes Relative: 8 %
Neutro Abs: 4.2 10*3/uL (ref 1.7–7.7)
Neutrophils Relative %: 68 %
Platelets: 174 10*3/uL (ref 150–400)
RBC: 3.59 MIL/uL — ABNORMAL LOW (ref 3.87–5.11)
RDW: 14.6 % (ref 11.5–15.5)
WBC: 6 10*3/uL (ref 4.0–10.5)
nRBC: 0 % (ref 0.0–0.2)

## 2018-11-09 LAB — URIC ACID: Uric Acid, Serum: 4.4 mg/dL (ref 2.5–7.1)

## 2018-11-09 LAB — LACTIC ACID, PLASMA: Lactic Acid, Venous: 1.8 mmol/L (ref 0.5–1.9)

## 2018-11-09 MED ORDER — DOXYCYCLINE MONOHYDRATE 100 MG PO TABS: 100.0000 mg | ORAL_TABLET | Freq: Two times a day (BID) | ORAL | 0 refills | Status: AC

## 2018-11-09 MED ORDER — CEPHALEXIN 500 MG PO CAPS: 500.0000 mg | ORAL_CAPSULE | Freq: Three times a day (TID) | ORAL | 0 refills | Status: AC

## 2018-11-09 NOTE — ED Provider Notes (Signed)
[facility] Emergency Department Provider Note  ____________________________________________  Time seen: Approximately 3:55 PM  I have reviewed the triage vital signs and the nursing notes.   HISTORY  Chief Complaint Ankle Pain    HPI [name] is a 85 y.o. female with a history of CVA, TIA, gout and bilateral leg swelling, presents to the emergency department with 2 to 3 days of right lateral ankle pain, swelling and erythema.  Patient states that she has never had gout pain in her ankle in the past and her pain currently feels worse than her gout pain.  She denies fever and chills at home.  She states that she takes Plavix daily but no other blood thinners.  No prior history of septic joints.  Patient states that she has been ambulating with some difficulty.  No other alleviating measures have been attempted.   Past Medical History:  Diagnosis Date  . CAD (coronary artery disease)    a. lexiscan 08/2013: nl wall motion, no ischemia, EF 50-55%; b. cardiac cath 05/31/2014: mLAD 90%, dLAD 50%, dLCx 60%, 1st Mrg 80%, pRCA 60% s/p PCI/DES, mRCA 1st lesion 70%, mRCA 2nd 95% s/p PCI/long DES to cover entire mRCA, RPLB 40%;  c. 05/2014 Staged PCI of LAD w/ 2.5 x 15 mm Xience Alpine DES.  . Carotid artery disease ([place])   . Chronic anxiety   . Diverticulosis   . Esophagitis   . GERD (gastroesophageal reflux disease)   . History of colon polyps   . History of echocardiogram    a. 08/2013: normal LVSF, nl RVSF, no valvular regurgitation or stenosis   . Hyperlipidemia   . Hypertension   . Hypothyroidism   . Mild reactive airways disease   . Osteoarthritis   . PONV (postoperative nausea and vomiting)   . Rheumatoid arthritis ([place])   . Stroke [facility])    a. 05/2014 pt c/o garbled speech following cath 5/19. MRI/A 5/26 showed left caudate infarct->conservative mgmt per neuro.  . Thyroid cancer ([place])    a. s/p right sided hemi-thyroidectomy; b. planning for  radioactive iodine tx; c. followed by Dr. Marcelene Butte  . Type 2 diabetes mellitus [facility])     Patient Active Problem List   Diagnosis Date Noted  . Achilles tendon contracture, bilateral 06/29/2017  . Acute bilateral low back pain without sciatica 03/17/2016  . Idiopathic chronic gout of multiple sites without tophus 03/17/2016  . Idiopathic gout of multiple sites 11/27/2015  . Cellulitis 05/15/2015  . Bilateral leg edema 02/08/2015  . Chronic anxiety 11/22/2014  . Cerebrovascular accident (CVA) ([place]) 07/25/2014  . Arterial vascular disease 07/25/2014  . Swelling of right lower extremity 06/22/2014  . Diabetes mellitus ([place])   . Stroke ([place]) 06/08/2014  . Anemia 06/07/2014  . Unstable angina ([place]) 06/06/2014  . Carotid artery disease ([place])   . Atherosclerosis of native coronary artery with stable angina pectoris ([place])   . Hyperlipidemia   . Essential hypertension   . Angina pectoris ([place]) 05/31/2014  . SOB (shortness of breath) 04/11/2014  . Palpitations 04/11/2014  . Other fatigue 04/11/2014  . Thyroid cancer ([place]) 04/11/2014    Past Surgical History:  Procedure Laterality Date  . ABDOMINAL HYSTERECTOMY    . APPENDECTOMY    . AUGMENTATION MAMMAPLASTY    . BACK SURGERY    . BREAST IMPLANT REMOVAL  06/2001  . CARDIAC CATHETERIZATION N/A 06/06/2014   Procedure: Coronary/Graft Atherectomy;  Surgeon: Wellington Hampshire, MD;  Location: [place] CV LAB;  Service: Cardiovascular;  Laterality: N/A;  . CARDIAC CATHETERIZATION N/A 05/31/2014   Procedure: Left Heart Cath;  Surgeon: Minna Merritts, MD;  Location: [place] CV LAB;  Service: Cardiovascular;  Laterality: N/A;  . CARDIAC CATHETERIZATION N/A 05/31/2014   Procedure: Coronary Stent Intervention;  Surgeon: Wellington Hampshire, MD;  Location: [place] CV LAB;  Service: Cardiovascular;  Laterality: N/A;  . CARPAL TUNNEL RELEASE     "I've have a total of 7" (06/06/2014)  . CATARACT EXTRACTION W/ INTRAOCULAR LENS  IMPLANT,  BILATERAL Bilateral   . CHOLECYSTECTOMY    . CORONARY ANGIOPLASTY WITH STENT PLACEMENT  05/31/2014; 06/06/2014   "2; 1"  . GANGLION CYST EXCISION Right    AC joint  . LUMBAR LAMINECTOMY  X 6  . MENISCUS REPAIR Bilateral    "2 on the right; 1 on the left"  . ROTATOR CUFF REPAIR     "I think 2 left, 2 right"  . THYROIDECTOMY    . TONSILLECTOMY    . TUBAL LIGATION      Prior to Admission medications   Medication Sig Start Date End Date Taking? Authorizing Provider  allopurinol (ZYLOPRIM) 100 MG tablet TAKE 1 TABLET(100 MG) BY MOUTH TWICE DAILY 07/21/18   Newt Minion, MD  ALPRAZolam Duanne Moron) 0.25 MG tablet Take 0.25 mg by mouth at bedtime as needed for anxiety or sleep.  08/23/13   [provider]  cephALEXin (KEFLEX) 500 MG capsule Take 1 capsule (500 mg total) by mouth 3 (three) times daily for 7 days. 11/09/18 11/16/18  Lannie Fields, PA-C  Cholecalciferol 1000 UNITS tablet Take 1,000 Units by mouth daily.     [provider]  clopidogrel (PLAVIX) 75 MG tablet TAKE 1 TABLET DAILY 03/07/18   Minna Merritts, MD  Colchicine (MITIGARE) 0.6 MG CAPS Take 0.6 mg by mouth daily. 12/14/17   Rayburn, Neta Mends, PA-C  doxycycline (ADOXA) 100 MG tablet Take 1 tablet (100 mg total) by mouth 2 (two) times daily for 7 days. 11/09/18 11/16/18  Lannie Fields, PA-C  ezetimibe (ZETIA) 10 MG tablet Take 1 tablet (10 mg total) by mouth daily. Patient not taking: Reported on 07/25/2018 11/12/17   Minna Merritts, MD  furosemide (LASIX) 20 MG tablet Take 1 tablet (20 mg total) by mouth daily as needed (As needed for leg swelling take potassium with this.). 06/27/18 09/25/18  Minna Merritts, MD  levothyroxine (SYNTHROID) 88 MCG tablet TAKE 1 TABLET(88 MCG) BY MOUTH DAILY 08/23/18   Verlon Au, NP  levothyroxine (SYNTHROID) 88 MCG tablet Take 1 tablet (88 mcg total) by mouth daily before breakfast. 08/23/18   Cammie Sickle, MD  metFORMIN (GLUCOPHAGE) 500 MG tablet Take 500 mg  by mouth 2 (two) times a day. 05/10/18   [provider]  metoprolol succinate (TOPROL XL) 50 MG 24 hr tablet Take 1 tablet (50 mg total) by mouth daily. 05/13/15   Minna Merritts, MD  Multiple Vitamins-Minerals (ICAPS AREDS 2 PO) Take by mouth.    [provider]  nitroGLYCERIN (NITROSTAT) 0.4 MG SL tablet PLACE 1 TABLET UNDER THE TONGUE EVERY 5 MINUTES AS NEEDED FOR CHEST PAIN 10/01/15   Minna Merritts, MD  pantoprazole (PROTONIX) 40 MG tablet TAKE 1 TABLET BY MOUTH DAILY 10/26/18   Minna Merritts, MD  potassium chloride (K-DUR) 10 MEQ tablet Take 1 tablet (10 mEq total) by mouth daily as needed (As needed with Furosemide.). 06/27/18 09/25/18  Minna Merritts, MD  predniSONE (DELTASONE) 10 MG tablet Take 2 tablets (20 mg total) by mouth daily with breakfast. 03/29/18   Newt Minion, MD  ramipril (ALTACE) 10 MG capsule TAKE 1 CAPSULE(10 MG) BY MOUTH DAILY 05/13/15   Minna Merritts, MD  rosuvastatin (CRESTOR) 5 MG tablet Take 1 tablet (5 mg total) by mouth daily. 10/05/18   Minna Merritts, MD  silver sulfADIAZINE (SILVADENE) 1 % cream Apply 1 application topically daily. 09/02/18   Lannie Fields, PA-C  Thiamine HCl (VITAMIN B-1 PO) Take by mouth daily.    [provider]  traMADol-acetaminophen (ULTRACET) 37.5-325 MG tablet Take 1 tablet by mouth every 8 (eight) hours as needed. Patient not taking: Reported on 07/25/2018 12/08/17   Sable Feil, PA-C  VITAMIN E PO Take by mouth daily.    [provider]    Allergies Ciprofloxacin, Codeine, Metronidazole, Other, Sulfa antibiotics, Neomycin, Neomycin-bacitracin zn-polymyx, Bacitracin-polymyxin b, Celecoxib, Petrolatum-zinc oxide, Statins, and Vioxx [rofecoxib]  Family History  Problem Relation Age of Onset  . Heart disease Sister        stent placed x 3     Social History Social History   Tobacco Use  . Smoking status: Never Smoker  . Smokeless tobacco: Never Used  Substance Use Topics  .  Alcohol use: No  . Drug use: No     Review of Systems  Constitutional: No fever/chills Eyes: No visual changes. No discharge ENT: No upper respiratory complaints. Cardiovascular: no chest pain. Respiratory: no cough. No SOB. Gastrointestinal: No abdominal pain.  No nausea, no vomiting.  No diarrhea.  No constipation. Genitourinary: Negative for dysuria. No hematuria Musculoskeletal: Patient has right ankle pain and swelling.  Skin: Patient has erythema of the skin.  Neurological: Negative for headaches, focal weakness or numbness.   ____________________________________________   PHYSICAL EXAM:  VITAL SIGNS: ED Triage Vitals [11/09/18 1524]  Enc Vitals Group     BP (!) 150/57     Pulse Rate 79     Resp 16     Temp 98.3 F (36.8 C)     Temp Source Oral     SpO2 98 %     Weight 144 lb (65.3 kg)     Height 5\' 2"  (1.575 m)     Head Circumference      Peak Flow      Pain Score 9     Pain Loc      Pain Edu?      Excl. in [place]?      Constitutional: Alert and oriented. Well appearing and in no acute distress. Eyes: Conjunctivae are normal. PERRL. EOMI. Head: Atraumatic. ENT: Cardiovascular: Normal rate, regular rhythm. Normal S1 and S2.  Good peripheral circulation. Respiratory: Normal respiratory effort without tachypnea or retractions. Lungs CTAB. Good air entry to the bases with no decreased or absent breath sounds. Gastrointestinal: Bowel sounds 4 quadrants. Soft and nontender to palpation. No guarding or rigidity. No palpable masses. No distention. No CVA tenderness. Musculoskeletal: Full range of motion to all extremities. No gross deformities appreciated. Neurologic:  Normal speech and language. No gross focal neurologic deficits are appreciated.  Skin: Patient has erythema along the lateral aspect of the right ankle and right calf with associated 1+ pitting edema. Psychiatric: Mood and affect are normal. Speech and behavior are normal. Patient exhibits appropriate  insight and judgement.   ____________________________________________   LABS (all labs ordered are listed, but only abnormal results are displayed)  Labs Reviewed  CBC WITH DIFFERENTIAL/PLATELET -  Abnormal; Notable for the following components:      Result Value   RBC 3.59 (*)    Hemoglobin 10.6 (*)    HCT 33.5 (*)    All other components within normal limits  COMPREHENSIVE METABOLIC PANEL - Abnormal; Notable for the following components:   Glucose, Bld 116 (*)    BUN 27 (*)    All other components within normal limits  URIC ACID  LACTIC ACID, PLASMA  LACTIC ACID, PLASMA   ____________________________________________  EKG   ____________________________________________  RADIOLOGY I personally viewed and evaluated these images as part of my medical decision making, as well as reviewing the written report by the radiologist    US Venous Img Lower Unilateral Right  Result Date: 11/09/2018 CLINICAL DATA:  85 year old female with right ankle pain since Saturday EXAM: RIGHT LOWER EXTREMITY VENOUS DOPPLER ULTRASOUND TECHNIQUE: Gray-scale sonography with graded compression, as well as color Doppler and duplex ultrasound were performed to evaluate the lower extremity deep venous systems from the level of the common femoral vein and including the common femoral, femoral, profunda femoral, popliteal and calf veins including the posterior tibial, peroneal and gastrocnemius veins when visible. The superficial great saphenous vein was also interrogated. Spectral Doppler was utilized to evaluate flow at rest and with distal augmentation maneuvers in the common femoral, femoral and popliteal veins. COMPARISON:  None. FINDINGS: Contralateral Common Femoral Vein: Respiratory phasicity is normal and symmetric with the symptomatic side. No evidence of thrombus. Normal compressibility. Common Femoral Vein: No evidence of thrombus. Normal compressibility, respiratory phasicity and response to  augmentation. Saphenofemoral Junction: No evidence of thrombus. Normal compressibility and flow on color Doppler imaging. Profunda Femoral Vein: No evidence of thrombus. Normal compressibility and flow on color Doppler imaging. Femoral Vein: No evidence of thrombus. Normal compressibility, respiratory phasicity and response to augmentation. Popliteal Vein: No evidence of thrombus. Normal compressibility, respiratory phasicity and response to augmentation. Calf Veins: No evidence of thrombus. Normal compressibility and flow on color Doppler imaging. Superficial Great Saphenous Vein: No evidence of thrombus. Normal compressibility. Venous Reflux:  None. Other Findings: Focal subcutaneous edema in the region of the lateral ankle. IMPRESSION: No evidence of deep venous thrombosis. Electronically Signed   By: Jacqulynn Cadet M.D.   On: 11/09/2018 17:50    ____________________________________________    PROCEDURES  Procedure(s) performed:    Procedures    Medications - No data to display   ____________________________________________   INITIAL IMPRESSION / ASSESSMENT AND PLAN / ED COURSE  Pertinent labs & imaging results that were available during my care of the patient were reviewed by me and considered in my medical decision making (see chart for details).  Review of the [place] CSRS was performed in accordance of the [employer] prior to dispensing any controlled drugs.  Clinical Course as of Nov 08 1924  Wed Nov 09, 2018  1707 Basophil: 1 [CM]    Clinical Course User Index [CM] [place], Marcie Bal, Student-PA     Assessment and Plan: Right Ankle Pain: 85 year old female presents to the emergency department with 2 to 3 days of right lateral ankle pain, swelling and erythema  Patient was hypertensive at triage but vital signs were otherwise reassuring.  Differential diagnosis includes DVT, cellulitis, gout, peripheral insufficiency...  No evidence of thrombosis on venous ultrasound of the  right lower extremity.  Uric acid level was within reference range.  No leukocytosis on CBC.  Patient reports that she normally takes colchicine for gout.  I advised her to start  taking colchicine over the next few days and to start taking doxycycline and Keflex prescribed during this emergency department encounter.  Antibiotics address empiric treatment for likely cellulitis.  Patient has known sulfa allergy.  She was advised to follow-up with primary care in 1 week to assess for symptomatic improvement. All patient questions were answered.     ____________________________________________  FINAL CLINICAL IMPRESSION(S) / ED DIAGNOSES  Final diagnoses:  Cellulitis of foot, right      NEW MEDICATIONS STARTED DURING THIS VISIT:  ED Discharge Orders         Ordered    cephALEXin (KEFLEX) 500 MG capsule  3 times daily     11/09/18 1859    doxycycline (ADOXA) 100 MG tablet  2 times daily     11/09/18 1859              This chart was dictated using voice recognition software/Dragon. Despite best efforts to proofread, errors can occur which can change the meaning. Any change was purely unintentional.    Lannie Fields, PA-C 11/09/18 Tawana Scale, MD 11/10/18 [phone]

## 2018-11-09 NOTE — ED Notes (Signed)
Signature pad not working. Pt verbalized understanding of d/c instructions.

## 2018-11-09 NOTE — ED Triage Notes (Signed)
Pt c/o right ankle pain without injury since Saturday. Pain with weight bearing. Pt was seen at [facility], no fx seen. Pt states that "maybe yall have better xray stuff than him". Arrives in ankle brace. Pt alert and oriented X4, cooperative, RR even and unlabored, color WNL. Pt in NAD.

## 2018-11-09 NOTE — ED Notes (Signed)
Assisted pt to bathroom

## 2018-11-11 ENCOUNTER — Other Ambulatory Visit: Payer: Self-pay | Admitting: Internal Medicine

## 2018-11-11 DIAGNOSIS — G459 Transient cerebral ischemic attack, unspecified: Secondary | ICD-10-CM

## 2018-11-11 DIAGNOSIS — Z135 Encounter for screening for eye and ear disorders: Secondary | ICD-10-CM

## 2018-11-14 ENCOUNTER — Other Ambulatory Visit (INDEPENDENT_AMBULATORY_CARE_PROVIDER_SITE_OTHER): Payer: Self-pay | Admitting: Orthopedic Surgery

## 2018-11-17 ENCOUNTER — Other Ambulatory Visit: Payer: Self-pay | Admitting: Cardiovascular Disease

## 2018-11-18 ENCOUNTER — Other Ambulatory Visit: Payer: Self-pay

## 2018-11-18 ENCOUNTER — Ambulatory Visit
Admission: RE | Admit: 2018-11-18 | Discharge: 2018-11-18 | Disposition: A | Discharge status: Home / Self Care | Payer: Medicare Other | Source: Ambulatory Visit | Attending: Internal Medicine | Admitting: Internal Medicine

## 2018-11-18 DIAGNOSIS — Z135 Encounter for screening for eye and ear disorders: Secondary | ICD-10-CM | POA: Insufficient documentation

## 2018-11-18 DIAGNOSIS — G459 Transient cerebral ischemic attack, unspecified: Secondary | ICD-10-CM | POA: Insufficient documentation

## 2018-11-20 ENCOUNTER — Other Ambulatory Visit: Payer: Self-pay | Admitting: Nurse Practitioner

## 2018-11-20 DIAGNOSIS — C73 Malignant neoplasm of thyroid gland: Secondary | ICD-10-CM

## 2018-11-22 ENCOUNTER — Other Ambulatory Visit: Payer: Self-pay

## 2018-11-22 ENCOUNTER — Ambulatory Visit: Payer: Self-pay

## 2018-11-22 ENCOUNTER — Ambulatory Visit (INDEPENDENT_AMBULATORY_CARE_PROVIDER_SITE_OTHER): Payer: Medicare Other | Admitting: Orthopedic Surgery

## 2018-11-22 ENCOUNTER — Ambulatory Visit: Payer: Medicare Other | Admitting: Orthopedic Surgery

## 2018-11-22 ENCOUNTER — Encounter: Payer: Self-pay | Admitting: Orthopedic Surgery

## 2018-11-22 VITALS — Ht 62.0 in | Wt 144.0 lb

## 2018-11-22 DIAGNOSIS — M19071 Primary osteoarthritis, right ankle and foot: Secondary | ICD-10-CM

## 2018-11-22 DIAGNOSIS — M79671 Pain in right foot: Secondary | ICD-10-CM | POA: Diagnosis not present

## 2018-11-22 MED ORDER — PREDNISONE 10 MG PO TABS: 10.0000 mg | ORAL_TABLET | Freq: Every day | ORAL | 1 refills | Status: DC

## 2018-11-22 NOTE — Progress Notes (Signed)
Office Visit Note   Patient: [name]           Date of Birth: 06/28/1933           MRN: [id number] Visit Date: 11/22/2018              Requested by: Idelle Crouch, MD [facility] [place],  [place] [address] PCP: Idelle Crouch, MD  Chief Complaint  Patient presents with  . Right Ankle - Pain, Edema      HPI: Patient is a 85 year old woman who complains of pain dorsal lateral aspect of the foot and ankle on the right.  Patient is on gout medicine including allopurinol and colchicine 2 weeks ago her uric acid was 4.4.  Patient states she has had ultrasound to rule out DVT.  Patient states at times her ankle pain has been 9 out of 10.  She has tried wearing an ASO but this did not help.  Assessment & Plan: Visit Diagnoses:  1. Right foot pain   2. Arthritis of right subtalar joint     Plan: Patient states she does not want to consider a injection for the subtalar joint.  Recommended that she try the Voltaren gel and she requests a refill prescription for prednisone.  Reevaluate at follow-up.  Follow-Up Instructions: Return in about 4 weeks (around 12/20/2018).   Ortho Exam  Patient is alert, oriented, no adenopathy, well-dressed, normal affect, normal respiratory effort. Examination patient has a palpable dorsalis pedis pulse.  She has an equinus contracture with dorsiflexion 10 degrees short of neutral of the ankle the ankle joint is essentially nontender to palpation.  Patient is maximally tender to palpation over the sinus Tarsi and patient has essentially no subtalar motion.  Imaging: Xr Ankle Complete Right  Result Date: 11/22/2018 2 view radiographs of the right ankle shows degenerative changes of the right ankle as well as collapse through the subtalar joint.  Patient has calcification of her arteries down through the midfoot.  No images are attached to the encounter.  Labs: Lab Results  Component Value Date   ESRSEDRATE 21 10/31/2013   LABURIC 4.4 11/09/2018   LABURIC 3.9 12/29/2016   LABURIC 5.2 06/22/2016   REPTSTATUS 03/20/2016 FINAL 03/18/2016   REPTSTATUS 04/08/2016 FINAL 03/18/2016   CULT  03/18/2016    MODERATE STAPHYLOCOCCUS SPECIES (COAGULASE NEGATIVE) CALL MICROBIOLOGY LAB IF SENSITIVITIES ARE REQUIRED. Performed at [facility], [place] [address] [place], [place] [address]    CULT  03/18/2016    NO FUNGUS ISOLATED AFTER 21 DAYS Performed at [facility], [place] [address], [place], [place] [address]      Lab Results  Component Value Date   ALBUMIN 4.0 11/09/2018   ALBUMIN 3.9 04/18/2018   ALBUMIN 3.8 10/19/2017   LABURIC 4.4 11/09/2018   LABURIC 3.9 12/29/2016   LABURIC 5.2 06/22/2016    No results found for: MG No results found for: VD25OH  No results found for: PREALBUMIN CBC EXTENDED Latest Ref Rng & Units 11/09/2018 04/18/2018 10/19/2017  WBC 4.0 - 10.5 K/uL 6.0 10.7(H) 12.1(H)  RBC 3.87 - 5.11 MIL/uL 3.59(L) 3.51(L) 3.78(L)  HGB 12.0 - 15.0 g/dL 10.6(L) 10.4(L) 10.9(L)  HCT 36.0 - 46.0 % 33.5(L) 32.4(L) 34.5(L)  PLT 150 - 400 K/uL 174 232 212  NEUTROABS 1.7 - 7.7 K/uL 4.2 9.3(H) 10.7(H)  LYMPHSABS 0.7 - 4.0 K/uL 1.0 0.9 0.9     Body mass index is 26.34 kg/m.  Orders:  Orders Placed This Encounter  Procedures  . XR Ankle Complete Right   No orders of the defined types were placed in this encounter.    Procedures: No procedures performed  Clinical Data: No additional findings.  ROS:  All other systems negative, except as noted in the HPI. Review of Systems  Objective: Vital Signs: Ht 5\' 2"  (1.575 m)   Wt 144 lb (65.3 kg)   BMI 26.34 kg/m   Specialty Comments:  No specialty comments available.  PMFS History: Patient Active Problem List   Diagnosis Date Noted  . Achilles tendon contracture, bilateral 06/29/2017  . Acute bilateral low back pain without sciatica 03/17/2016  . Idiopathic chronic gout of multiple sites without  tophus 03/17/2016  . Idiopathic gout of multiple sites 11/27/2015  . Cellulitis 05/15/2015  . Bilateral leg edema 02/08/2015  . Chronic anxiety 11/22/2014  . Cerebrovascular accident (CVA) ([place]) 07/25/2014  . Arterial vascular disease 07/25/2014  . Swelling of right lower extremity 06/22/2014  . Diabetes mellitus ([place])   . Stroke ([place]) 06/08/2014  . Anemia 06/07/2014  . Unstable angina ([place]) 06/06/2014  . Carotid artery disease ([place])   . Atherosclerosis of native coronary artery with stable angina pectoris ([place])   . Hyperlipidemia   . Essential hypertension   . Angina pectoris ([place]) 05/31/2014  . SOB (shortness of breath) 04/11/2014  . Palpitations 04/11/2014  . Other fatigue 04/11/2014  . Thyroid cancer ([place]) 04/11/2014   Past Medical History:  Diagnosis Date  . CAD (coronary artery disease)    a. lexiscan 08/2013: nl wall motion, no ischemia, EF 50-55%; b. cardiac cath 05/31/2014: mLAD 90%, dLAD 50%, dLCx 60%, 1st Mrg 80%, pRCA 60% s/p PCI/DES, mRCA 1st lesion 70%, mRCA 2nd 95% s/p PCI/long DES to cover entire mRCA, RPLB 40%;  c. 05/2014 Staged PCI of LAD w/ 2.5 x 15 mm Xience Alpine DES.  . Carotid artery disease ([place])   . Chronic anxiety   . Diverticulosis   . Esophagitis   . GERD (gastroesophageal reflux disease)   . History of colon polyps   . History of echocardiogram    a. 08/2013: normal LVSF, nl RVSF, no valvular regurgitation or stenosis   . Hyperlipidemia   . Hypertension   . Hypothyroidism   . Mild reactive airways disease   . Osteoarthritis   . PONV (postoperative nausea and vomiting)   . Rheumatoid arthritis ([place])   . Stroke [facility])    a. 05/2014 pt c/o garbled speech following cath 5/19. MRI/A 5/26 showed left caudate infarct->conservative mgmt per neuro.  . Thyroid cancer ([place])    a. s/p right sided hemi-thyroidectomy; b. planning for radioactive iodine tx; c. followed by Dr. Marcelene Butte  . Type 2 diabetes mellitus ([facility])     Family History  Problem Relation Age of  Onset  . Heart disease Sister        stent placed x 3     Past Surgical History:  Procedure Laterality Date  . ABDOMINAL HYSTERECTOMY    . APPENDECTOMY    . AUGMENTATION MAMMAPLASTY    . BACK SURGERY    . BREAST IMPLANT REMOVAL  06/2001  . CARDIAC CATHETERIZATION N/A 06/06/2014   Procedure: Coronary/Graft Atherectomy;  Surgeon: Wellington Hampshire, MD;  Location: [place] CV LAB;  Service: Cardiovascular;  Laterality: N/A;  . CARDIAC CATHETERIZATION N/A 05/31/2014   Procedure: Left Heart Cath;  Surgeon: Minna Merritts, MD;  Location: [place] [facility] LAB;  Service: Cardiovascular;  Laterality: N/A;  .  CARDIAC CATHETERIZATION N/A 05/31/2014   Procedure: Coronary Stent Intervention;  Surgeon: Wellington Hampshire, MD;  Location: [place] [facility] LAB;  Service: Cardiovascular;  Laterality: N/A;  . CARPAL TUNNEL RELEASE     "I've have a total of 7" (06/06/2014)  . CATARACT EXTRACTION W/ INTRAOCULAR LENS  IMPLANT, BILATERAL Bilateral   . CHOLECYSTECTOMY    . CORONARY ANGIOPLASTY WITH STENT PLACEMENT  05/31/2014; 06/06/2014   "2; 1"  . GANGLION CYST EXCISION Right    AC joint  . LUMBAR LAMINECTOMY  X 6  . MENISCUS REPAIR Bilateral    "2 on the right; 1 on the left"  . ROTATOR CUFF REPAIR     "I think 2 left, 2 right"  . THYROIDECTOMY    . TONSILLECTOMY    . TUBAL LIGATION     Social History   Occupational History  . Occupation: retired  Tobacco Use  . Smoking status: Never Smoker  . Smokeless tobacco: Never Used  Substance and Sexual Activity  . Alcohol use: No  . Drug use: No  . Sexual activity: Not Currently

## 2018-12-20 ENCOUNTER — Ambulatory Visit (INDEPENDENT_AMBULATORY_CARE_PROVIDER_SITE_OTHER): Payer: Medicare Other | Admitting: Orthopedic Surgery

## 2018-12-20 ENCOUNTER — Other Ambulatory Visit: Payer: Self-pay

## 2018-12-20 ENCOUNTER — Encounter: Payer: Self-pay | Admitting: Orthopedic Surgery

## 2018-12-20 VITALS — Ht 62.0 in | Wt 144.0 lb

## 2018-12-20 DIAGNOSIS — M19071 Primary osteoarthritis, right ankle and foot: Secondary | ICD-10-CM | POA: Diagnosis not present

## 2018-12-20 NOTE — Progress Notes (Signed)
Office Visit Note   Patient: [name]           Date of Birth: [birth year]           MRN: [id number] Visit Date: 12/20/2018              Requested by: Idelle Crouch, MD [facility] [place],  [place] [address] PCP: Idelle Crouch, MD  Chief Complaint  Patient presents with  . Right Foot - Follow-up  . Right Ankle - Follow-up      HPI: Patient presents in follow-up today for her right foot and ankle at her last visit she was diagnosed with subtalar arthritis as well as arthritic changes in her ankle she was prescribed some prednisone which she says has not helped her she was also offered a subtalar injection and has declined this she is not interested in a surgical solution at this time  Assessment & Plan: Visit Diagnoses: No diagnosis found.  Plan: I have given her a prescription for an [place] she will follow-up a month after she has this  Follow-Up Instructions: No follow-ups on file.   Ortho Exam  Patient is alert, oriented, no adenopathy, well-dressed, normal affect, normal respiratory effort. Examination of her right foot demonstrates mild soft tissue swelling pulses intact and palpable pain more significant over the subtalar joint but also present in the ankle joint she comes to neutral of flexion  Imaging: No results found. No images are attached to the encounter.  Labs: Lab Results  Component Value Date   ESRSEDRATE 21 10/31/2013   LABURIC 4.4 11/09/2018   LABURIC 3.9 12/29/2016   LABURIC 5.2 06/22/2016   REPTSTATUS 03/20/2016 FINAL 03/18/2016   REPTSTATUS 04/08/2016 FINAL 03/18/2016   CULT  03/18/2016    MODERATE STAPHYLOCOCCUS SPECIES (COAGULASE NEGATIVE) CALL MICROBIOLOGY LAB IF SENSITIVITIES ARE REQUIRED. Performed at [facility], [place] [address] [place], [place] [address]    CULT  03/18/2016    NO FUNGUS ISOLATED AFTER 21 DAYS Performed at [facility], [place] [address], [place],  [place] [address]      Lab Results  Component Value Date   ALBUMIN 4.0 11/09/2018   ALBUMIN 3.9 04/18/2018   ALBUMIN 3.8 10/19/2017   LABURIC 4.4 11/09/2018   LABURIC 3.9 12/29/2016   LABURIC 5.2 06/22/2016    No results found for: MG No results found for: VD25OH  No results found for: PREALBUMIN CBC EXTENDED Latest Ref Rng & Units 11/09/2018 04/18/2018 10/19/2017  WBC 4.0 - 10.5 K/uL 6.0 10.7(H) 12.1(H)  RBC 3.87 - 5.11 MIL/uL 3.59(L) 3.51(L) 3.78(L)  HGB 12.0 - 15.0 g/dL 10.6(L) 10.4(L) 10.9(L)  HCT 36.0 - 46.0 % 33.5(L) 32.4(L) 34.5(L)  PLT 150 - 400 K/uL 174 232 212  NEUTROABS 1.7 - 7.7 K/uL 4.2 9.3(H) 10.7(H)  LYMPHSABS 0.7 - 4.0 K/uL 1.0 0.9 0.9     Body mass index is 26.34 kg/m.  Orders:  No orders of the defined types were placed in this encounter.  No orders of the defined types were placed in this encounter.    Procedures: No procedures performed  Clinical Data: No additional findings.  ROS:  All other systems negative, except as noted in the HPI. Review of Systems  Objective: Vital Signs: Ht 5\' 2"  (1.575 m)   Wt 144 lb (65.3 kg)   BMI 26.34 kg/m   Specialty Comments:  No specialty comments available.  PMFS History: Patient Active Problem List  Diagnosis Date Noted  . Achilles tendon contracture, bilateral 06/29/2017  . Acute bilateral low back pain without sciatica 03/17/2016  . Idiopathic chronic gout of multiple sites without tophus 03/17/2016  . Idiopathic gout of multiple sites 11/27/2015  . Cellulitis 05/15/2015  . Bilateral leg edema 02/08/2015  . Chronic anxiety 11/22/2014  . Cerebrovascular accident (CVA) ([place]) 07/25/2014  . Arterial vascular disease 07/25/2014  . Swelling of right lower extremity 06/22/2014  . Diabetes mellitus ([place])   . Stroke ([place]) 06/08/2014  . Anemia 06/07/2014  . Unstable angina ([place]) 06/06/2014  . Carotid artery disease ([place])   . Atherosclerosis of native coronary artery with stable angina pectoris ([place])   .  Hyperlipidemia   . Essential hypertension   . Angina pectoris ([place]) 05/31/2014  . SOB (shortness of breath) 04/11/2014  . Palpitations 04/11/2014  . Other fatigue 04/11/2014  . Thyroid cancer ([place]) 04/11/2014   Past Medical History:  Diagnosis Date  . CAD (coronary artery disease)    a. lexiscan 08/2013: nl wall motion, no ischemia, EF 50-55%; b. cardiac cath 05/31/2014: mLAD 90%, dLAD 50%, dLCx 60%, 1st Mrg 80%, pRCA 60% s/p PCI/DES, mRCA 1st lesion 70%, mRCA 2nd 95% s/p PCI/long DES to cover entire mRCA, RPLB 40%;  c. 05/2014 Staged PCI of LAD w/ 2.5 x 15 mm Xience [place]pine DES.  . Carotid artery disease ([place])   . Chronic anxiety   . Diverticulosis   . Esophagitis   . GERD (gastroesophageal reflux disease)   . History of colon polyps   . History of echocardiogram    a. 08/2013: normal LVSF, nl RVSF, no valvular regurgitation or stenosis   . Hyperlipidemia   . Hypertension   . Hypothyroidism   . Mild reactive airways disease   . Osteoarthritis   . PONV (postoperative nausea and vomiting)   . Rheumatoid arthritis ([place])   . Stroke [facility])    a. 05/2014 pt c/o garbled speech following cath 5/19. MRI/A 5/26 showed left caudate infarct->conservative mgmt per neuro.  . Thyroid cancer ([place])    a. s/p right sided hemi-thyroidectomy; b. planning for radioactive iodine tx; c. followed by Dr. Marcelene Butte  . Type 2 diabetes mellitus ([facility])     Family History  Problem Relation Age of Onset  . Heart disease Sister        stent placed x 3     Past Surgical History:  Procedure Laterality Date  . ABDOMINAL HYSTERECTOMY    . APPENDECTOMY    . AUGMENTATION MAMMAPLASTY    . BACK SURGERY    . BREAST IMPLANT REMOVAL  06/2001  . CARDIAC CATHETERIZATION N/A 06/06/2014   Procedure: Coronary/Graft Atherectomy;  Surgeon: Wellington Hampshire, MD;  Location: [facility];  Service: Cardiovascular;  Laterality: N/A;  . CARDIAC CATHETERIZATION N/A 05/31/2014   Procedure: Left Heart Cath;  Surgeon: Minna Merritts, MD;  Location: [facility];  Service: Cardiovascular;  Laterality: N/A;  . CARDIAC CATHETERIZATION N/A 05/31/2014   Procedure: Coronary Stent Intervention;  Surgeon: Wellington Hampshire, MD;  Location: [facility];  Service: Cardiovascular;  Laterality: N/A;  . CARPAL TUNNEL RELEASE     "I've have a total of 7" (06/06/2014)  . CATARACT EXTRACTION W/ INTRAOCULAR LENS  IMPLANT, BILATERAL Bilateral   . CHOLECYSTECTOMY    . CORONARY ANGIOPLASTY WITH STENT PLACEMENT  05/31/2014; 06/06/2014   "2; 1"  . GANGLION CYST EXCISION Right    AC joint  . LUMBAR LAMINECTOMY  X 6  .  MENISCUS REPAIR Bilateral    "2 on the right; 1 on the left"  . ROTATOR CUFF REPAIR     "I think 2 left, 2 right"  . THYROIDECTOMY    . TONSILLECTOMY    . TUBAL LIGATION     Social History   Occupational History  . Occupation: retired  Tobacco Use  . Smoking status: Never Smoker  . Smokeless tobacco: Never Used  Substance and Sexual Activity  . Alcohol use: No  . Drug use: No  . Sexual activity: Not Currently

## 2018-12-22 ENCOUNTER — Other Ambulatory Visit: Payer: Self-pay | Admitting: Cardiovascular Disease

## 2018-12-24 ENCOUNTER — Other Ambulatory Visit: Payer: Self-pay | Admitting: Cardiovascular Disease

## 2019-01-03 ENCOUNTER — Other Ambulatory Visit: Payer: Self-pay | Admitting: Cardiovascular Disease

## 2019-01-03 MED ORDER — ROSUVASTATIN CALCIUM 5 MG PO TABS: 5.0000 mg | ORAL_TABLET | Freq: Every day | ORAL | 2 refills | Status: DC

## 2019-01-03 NOTE — Telephone Encounter (Signed)
Requested Prescriptions   Signed Prescriptions Disp Refills  . rosuvastatin (CRESTOR) 5 MG tablet 90 tablet 2    Sig: Take 1 tablet (5 mg total) by mouth daily.    Authorizing Provider: Minna Merritts    Ordering User: Britt Bottom

## 2019-01-03 NOTE — Telephone Encounter (Signed)
*  STAT* If patient is at the pharmacy, call can be transferred to refill team.   1. Which medications need to be refilled? (please list name of each medication and dose if known) rosuvastatin (CRESTOR) 5 MG 1 tablet daily   2. Which pharmacy/location (including street and city if local pharmacy) is medication to be sent to? [facility] near [employer] - no longer wants to use mail in pharmacy   3. Do they need a 30 day or 90 day supply? 90 day

## 2019-01-23 ENCOUNTER — Other Ambulatory Visit: Payer: Self-pay

## 2019-01-23 ENCOUNTER — Inpatient Hospital Stay: Payer: Medicare Other

## 2019-01-23 ENCOUNTER — Inpatient Hospital Stay: Payer: Medicare Other | Attending: Internal Medicine | Admitting: Internal Medicine

## 2019-01-23 DIAGNOSIS — E039 Hypothyroidism, unspecified: Secondary | ICD-10-CM | POA: Insufficient documentation

## 2019-01-23 DIAGNOSIS — C73 Malignant neoplasm of thyroid gland: Secondary | ICD-10-CM

## 2019-01-23 DIAGNOSIS — D649 Anemia, unspecified: Secondary | ICD-10-CM | POA: Insufficient documentation

## 2019-01-23 DIAGNOSIS — Z79899 Other long term (current) drug therapy: Secondary | ICD-10-CM | POA: Diagnosis not present

## 2019-01-23 LAB — COMPREHENSIVE METABOLIC PANEL
ALT: 17 U/L (ref 0–44)
AST: 22 U/L (ref 15–41)
Albumin: 3.8 g/dL (ref 3.5–5.0)
Alkaline Phosphatase: 55 U/L (ref 38–126)
Anion gap: 10 (ref 5–15)
BUN: 25 mg/dL — ABNORMAL HIGH (ref 8–23)
CO2: 21 mmol/L — ABNORMAL LOW (ref 22–32)
Calcium: 8.9 mg/dL (ref 8.9–10.3)
Chloride: 109 mmol/L (ref 98–111)
Creatinine, Ser: 0.82 mg/dL (ref 0.44–1.00)
GFR calc Af Amer: >60 mL/min (ref >60)
GFR calc non Af Amer: >60 mL/min (ref >60)
Glucose, Bld: 122 mg/dL — ABNORMAL HIGH (ref 70–99)
Potassium: 4 mmol/L (ref 3.5–5.1)
Sodium: 140 mmol/L (ref 135–145)
Total Bilirubin: 0.6 mg/dL (ref 0.3–1.2)
Total Protein: 6.3 g/dL — ABNORMAL LOW (ref 6.5–8.1)

## 2019-01-23 LAB — CBC WITH DIFFERENTIAL/PLATELET
Abs Immature Granulocytes: 0.04 10*3/uL (ref 0.00–0.07)
Basophils Absolute: 0 10*3/uL (ref 0.0–0.1)
Basophils Relative: 0 %
Eosinophils Absolute: 0.3 10*3/uL (ref 0.0–0.5)
Eosinophils Relative: 4 %
HCT: 35.4 % — ABNORMAL LOW (ref 36.0–46.0)
Hemoglobin: 10.9 g/dL — ABNORMAL LOW (ref 12.0–15.0)
Immature Granulocytes: 1 %
Lymphocytes Relative: 13 %
Lymphs Abs: 0.9 10*3/uL (ref 0.7–4.0)
MCH: 29.6 pg (ref 26.0–34.0)
MCHC: 30.8 g/dL (ref 30.0–36.0)
MCV: 96.2 fL (ref 80.0–100.0)
Monocytes Absolute: 0.5 10*3/uL (ref 0.1–1.0)
Monocytes Relative: 7 %
Neutro Abs: 5 10*3/uL (ref 1.7–7.7)
Neutrophils Relative %: 75 %
Platelets: 191 10*3/uL (ref 150–400)
RBC: 3.68 MIL/uL — ABNORMAL LOW (ref 3.87–5.11)
RDW: 15 % (ref 11.5–15.5)
WBC: 6.7 10*3/uL (ref 4.0–10.5)
nRBC: 0 % (ref 0.0–0.2)

## 2019-01-23 NOTE — Progress Notes (Signed)
[facility] OFFICE PROGRESS NOTE  Patient Care Team: Idelle Crouch, MD as PCP - General (Internal Medicine) Minna Merritts, MD as Consulting Physician (Cardiology) Cammie Sickle, MD as Medical Oncologist (Medical Oncology)  Cancer Staging Thyroid cancer [facility]) Staging form: Thyroid - Papillary or Follicular, AJCC 7th Edition - Clinical stage from 03/22/2014: Stage III (T3, N0, M0) - Signed by Cammie Sickle, MD on 10/22/2015 Staging form: Thyroid - Papillary or Follicular (Under 45 years), AJCC 7th Edition - Pathologic: No stage assigned - Unsigned    Oncology History Overview Note  1. 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  0.5 cm tumor extending into perithyroid soft tissue status post total thyroidectomy;   One lymph node was negative.  No angiolymphatic invasion seen [Dr.McQueen]; Intermediate risk of recurrence- goal TSH 0.1- 0.5  # Status post radioactive iodine therapy in May of 2016.  # On Synthroid    Thyroid cancer ([place])      INTERVAL HISTORY:  [name] 86 y.o.  female pleasant patient above history of Stage III papillary thyroid carcinoma is here for follow-up.   In the interim patient's daughter and granddaughter had Covid infection.  Patient did not have any contact or have any infections.  Patient denies any swelling in the neck. Denies any lumps in the neck; or in the underarms.No tremors. No palpitations.  Review of Systems  Constitutional: Negative for chills, diaphoresis, fever, malaise/fatigue and weight loss.  HENT: Negative for nosebleeds and sore throat.   Eyes: Negative for double vision.  Respiratory: Negative for cough, hemoptysis, sputum production, shortness of breath and wheezing.   Cardiovascular: Negative for chest pain, palpitations, orthopnea and leg swelling.  Gastrointestinal: Negative for abdominal pain, blood in stool, constipation,  diarrhea, heartburn, melena, nausea and vomiting.  Genitourinary: Negative for dysuria, frequency and urgency.  Musculoskeletal: Negative for back pain and joint pain.  Skin: Negative.  Negative for itching and rash.  Neurological: Negative for dizziness, tingling, focal weakness, weakness and headaches.  Endo/Heme/Allergies: Does not bruise/bleed easily.  Psychiatric/Behavioral: Negative for depression. The patient is not nervous/anxious and does not have insomnia.      PAST MEDICAL HISTORY :  Past Medical History:  Diagnosis Date  . CAD (coronary artery disease)    a. lexiscan 08/2013: nl wall motion, no ischemia, EF 50-55%; b. cardiac cath 05/31/2014: mLAD 90%, dLAD 50%, dLCx 60%, 1st Mrg 80%, pRCA 60% s/p PCI/DES, mRCA 1st lesion 70%, mRCA 2nd 95% s/p PCI/long DES to cover entire mRCA, RPLB 40%;  c. 05/2014 Staged PCI of LAD w/ 2.5 x 15 mm Xience [place]pine DES.  . Carotid artery disease ([place])   . Chronic anxiety   . Diverticulosis   . Esophagitis   . GERD (gastroesophageal reflux disease)   . History of colon polyps   . History of echocardiogram    a. 08/2013: normal LVSF, nl RVSF, no valvular regurgitation or stenosis   . Hyperlipidemia   . Hypertension   . Hypothyroidism   . Mild reactive airways disease   . Osteoarthritis   . PONV (postoperative nausea and vomiting)   . Rheumatoid arthritis ([place])   . Stroke [facility])    a. 05/2014 pt c/o garbled speech following cath 5/19. MRI/A 5/26 showed left caudate infarct->conservative mgmt per neuro.  . Thyroid cancer ([place])    a. s/p right sided hemi-thyroidectomy; b. planning for radioactive iodine tx; c. followed by Dr. Marcelene Butte  .  Type 2 diabetes mellitus ([place])     PAST SURGICAL HISTORY :   Past Surgical History:  Procedure Laterality Date  . ABDOMINAL HYSTERECTOMY    . APPENDECTOMY    . AUGMENTATION MAMMAPLASTY    . BACK SURGERY    . BREAST IMPLANT REMOVAL  06/2001  . CARDIAC CATHETERIZATION N/A 06/06/2014   Procedure: Coronary/Graft  Atherectomy;  Surgeon: Wellington Hampshire, MD;  Location: [facility];  Service: Cardiovascular;  Laterality: N/A;  . CARDIAC CATHETERIZATION N/A 05/31/2014   Procedure: Left Heart Cath;  Surgeon: Minna Merritts, MD;  Location: [facility];  Service: Cardiovascular;  Laterality: N/A;  . CARDIAC CATHETERIZATION N/A 05/31/2014   Procedure: Coronary Stent Intervention;  Surgeon: Wellington Hampshire, MD;  Location: [facility];  Service: Cardiovascular;  Laterality: N/A;  . CARPAL TUNNEL RELEASE     "I've have a total of 7" (06/06/2014)  . CATARACT EXTRACTION W/ INTRAOCULAR LENS  IMPLANT, BILATERAL Bilateral   . CHOLECYSTECTOMY    . CORONARY ANGIOPLASTY WITH STENT PLACEMENT  05/31/2014; 06/06/2014   "2; 1"  . GANGLION CYST EXCISION Right    AC joint  . LUMBAR LAMINECTOMY  X 6  . MENISCUS REPAIR Bilateral    "2 on the right; 1 on the left"  . ROTATOR CUFF REPAIR     "I think 2 left, 2 right"  . THYROIDECTOMY    . TONSILLECTOMY    . TUBAL LIGATION      FAMILY HISTORY :   Family History  Problem Relation Age of Onset  . Heart disease Sister        stent placed x 3     SOCIAL HISTORY:   Social History   Tobacco Use  . Smoking status: Never Smoker  . Smokeless tobacco: Never Used  Substance Use Topics  . Alcohol use: No  . Drug use: No    ALLERGIES:  is allergic to ciprofloxacin; codeine; metronidazole; other; sulfa antibiotics; neomycin; neomycin-bacitracin zn-polymyx; bacitracin-polymyxin b; celecoxib; petrolatum-zinc oxide; statins; and vioxx [rofecoxib].  MEDICATIONS:  Current Outpatient Medications  Medication Sig Dispense Refill  . allopurinol (ZYLOPRIM) 100 MG tablet TAKE 1 TABLET(100 MG) BY MOUTH TWICE DAILY 60 tablet 3  . ALPRAZolam (XANAX) 0.25 MG tablet Take 0.25 mg by mouth at bedtime as needed for anxiety or sleep.     . Cholecalciferol 1000 UNITS tablet Take 1,000 Units by mouth daily.     . clopidogrel (PLAVIX) 75 MG tablet TAKE 1 TABLET DAILY  90 tablet 4  . Colchicine (MITIGARE) 0.6 MG CAPS Take 0.6 mg by mouth daily. 30 capsule 4  . ezetimibe (ZETIA) 10 MG tablet Take 1 tablet (10 mg total) by mouth daily. 90 tablet 3  . levothyroxine (SYNTHROID) 88 MCG tablet Take 1 tablet (88 mcg total) by mouth daily before breakfast. 90 tablet 0  . levothyroxine (SYNTHROID) 88 MCG tablet TAKE 1 TABLET(88 MCG) BY MOUTH DAILY 90 tablet 0  . metFORMIN (GLUCOPHAGE) 500 MG tablet Take 500 mg by mouth 2 (two) times a day.    . metoprolol succinate (TOPROL XL) 50 MG 24 hr tablet Take 1 tablet (50 mg total) by mouth daily. 90 tablet 3  . Multiple Vitamins-Minerals (ICAPS AREDS 2 PO) Take by mouth.    . nitroGLYCERIN (NITROSTAT) 0.4 MG SL tablet PLACE 1 TABLET UNDER THE TONGUE EVERY 5 MINUTES AS NEEDED FOR CHEST PAIN 25 tablet 0  . pantoprazole (PROTONIX) 40 MG tablet TAKE 1 TABLET BY MOUTH DAILY  90 tablet 2  . predniSONE (DELTASONE) 10 MG tablet Take 2 tablets (20 mg total) by mouth daily with breakfast. 60 tablet 0  . predniSONE (DELTASONE) 10 MG tablet Take 1 tablet (10 mg total) by mouth daily with breakfast. 60 tablet 1  . ramipril (ALTACE) 10 MG capsule TAKE 1 CAPSULE(10 MG) BY MOUTH DAILY 30 capsule 3  . rosuvastatin (CRESTOR) 5 MG tablet Take 1 tablet (5 mg total) by mouth daily. 90 tablet 2  . silver sulfADIAZINE (SILVADENE) 1 % cream Apply 1 application topically daily. 50 g 0  . Thiamine HCl (VITAMIN B-1 PO) Take by mouth daily.    . traMADol-acetaminophen (ULTRACET) 37.5-325 MG tablet Take 1 tablet by mouth every 8 (eight) hours as needed. 12 tablet 0  . VITAMIN E PO Take by mouth daily.    . furosemide (LASIX) 20 MG tablet Take 1 tablet (20 mg total) by mouth daily as needed (As needed for leg swelling take potassium with this.). 90 tablet 3  . potassium chloride (K-DUR) 10 MEQ tablet Take 1 tablet (10 mEq total) by mouth daily as needed (As needed with Furosemide.). 90 tablet 3   No current facility-administered medications for this visit.     PHYSICAL EXAMINATION: ECOG PERFORMANCE STATUS: 0 - Asymptomatic  BP (!) 162/75 (BP Location: Left Arm, Patient Position: Sitting)   Pulse 75   Temp 98 F (36.7 C) (Tympanic)   Resp 20   Wt 150 lb (68 kg)   SpO2 100%   BMI 27.44 kg/m   Filed Weights   01/23/19 1331  Weight: 150 lb (68 kg)    Physical Exam  Constitutional: She is oriented to person, place, and time and well-developed, well-nourished, and in no distress.  HENT:  Head: Normocephalic and atraumatic.  Mouth/Throat: Oropharynx is clear and moist. No oropharyngeal exudate.  Eyes: Pupils are equal, round, and reactive to light.  Cardiovascular: Normal rate and regular rhythm.  Pulmonary/Chest: No respiratory distress. She has no wheezes.  Abdominal: Soft. Bowel sounds are normal. She exhibits no distension and no mass. There is no abdominal tenderness. There is no rebound and no guarding.  Musculoskeletal:        General: No tenderness or edema. Normal range of motion.     Cervical back: Normal range of motion and neck supple.  Neurological: She is alert and oriented to person, place, and time.  Skin: Skin is warm.  Psychiatric: Affect normal.     LABORATORY DATA:  I have reviewed the data as listed    Component Value Date/Time   NA 140 01/23/2019 1255   NA 139 05/04/2014 1058   K 4.0 01/23/2019 1255   K 4.5 05/04/2014 1058   CL 109 01/23/2019 1255   CL 105 05/04/2014 1058   CO2 21 (L) 01/23/2019 1255   CO2 26 05/04/2014 1058   GLUCOSE 122 (H) 01/23/2019 1255   GLUCOSE 161 (H) 05/04/2014 1058   BUN 25 (H) 01/23/2019 1255   BUN 20 05/04/2014 1058   CREATININE 0.82 01/23/2019 1255   CREATININE 1.10 (H) 05/04/2014 1058   CALCIUM 8.9 01/23/2019 1255   CALCIUM 9.5 05/04/2014 1058   PROT 6.3 (L) 01/23/2019 1255   PROT 6.9 05/04/2014 1058   ALBUMIN 3.8 01/23/2019 1255   ALBUMIN 4.1 05/04/2014 1058   AST 22 01/23/2019 1255   AST 26 05/04/2014 1058   ALT 17 01/23/2019 1255   ALT 17 05/04/2014 1058    ALKPHOS 55 01/23/2019 1255   ALKPHOS 52  05/04/2014 1058   BILITOT 0.6 01/23/2019 1255   BILITOT 0.6 05/04/2014 1058   GFRNONAA >60 01/23/2019 1255   GFRNONAA 48 (L) 05/04/2014 1058   GFRAA >60 01/23/2019 1255   GFRAA 56 (L) 05/04/2014 1058    No results found for: SPEP, UPEP  Lab Results  Component Value Date   WBC 6.7 01/23/2019   NEUTROABS 5.0 01/23/2019   HGB 10.9 (L) 01/23/2019   HCT 35.4 (L) 01/23/2019   MCV 96.2 01/23/2019   PLT 191 01/23/2019      Chemistry      Component Value Date/Time   NA 140 01/23/2019 1255   NA 139 05/04/2014 1058   K 4.0 01/23/2019 1255   K 4.5 05/04/2014 1058   CL 109 01/23/2019 1255   CL 105 05/04/2014 1058   CO2 21 (L) 01/23/2019 1255   CO2 26 05/04/2014 1058   BUN 25 (H) 01/23/2019 1255   BUN 20 05/04/2014 1058   CREATININE 0.82 01/23/2019 1255   CREATININE 1.10 (H) 05/04/2014 1058      Component Value Date/Time   CALCIUM 8.9 01/23/2019 1255   CALCIUM 9.5 05/04/2014 1058   ALKPHOS 55 01/23/2019 1255   ALKPHOS 52 05/04/2014 1058   AST 22 01/23/2019 1255   AST 26 05/04/2014 1058   ALT 17 01/23/2019 1255   ALT 17 05/04/2014 1058   BILITOT 0.6 01/23/2019 1255   BILITOT 0.6 05/04/2014 1058       RADIOGRAPHIC STUDIES: I have personally reviewed the radiological images as listed and agreed with the findings in the report. No results found.   ASSESSMENT & PLAN:  Thyroid cancer ([place]) Thyroid cancer- stage III status post resection followed by radioactive iodine;on synthroid.  Clinically stable.  #Clinically no evidence of recurrence.;  Thyroglobulin levels normal-pending.   # Hypothyroidism /TSH suppression -currently on Synthoid 88 mcg [Goal: TSH for intermediate risk- 0.1-0.5]-given age/comorbidities recommend normal TSH.  TSH penidng.   # Mild anemia hemoglobin 10.5 chronic-stable; iron studies- N. ? Etiology- STABLE.  # # I discussed regarding Covid precautions/and also discussed proceeding with Covid vaccination when  available.  Discussed that unfortunately the data safety and efficacy of vaccination is unclear especially in patients with immunocompromised state.  However, I think the benefits of the vaccination outweigh the potential risks.   # DISPOSITION: will with results results-  # follow up in  32months- MD/labs-CBC/CMP/Thyroid profile/ thyroglobulin by RIA- Dr.B   Orders Placed This Encounter  Procedures  . CBC with Differential    Standing Status:   Future    Standing Expiration Date:   01/23/2020  . Comprehensive metabolic panel    Standing Status:   Future    Standing Expiration Date:   01/23/2020  . TgAb+Thyroglobulin IMA or RIA    Standing Status:   Future    Standing Expiration Date:   01/23/2020  . Thyroid Panel With TSH    Standing Status:   Future    Standing Expiration Date:   01/23/2020   All questions were answered. The patient knows to call the clinic with any problems, questions or concerns.      Cammie Sickle, MD 01/23/2019 2:46 PM

## 2019-01-23 NOTE — Assessment & Plan Note (Signed)
Thyroid cancer- stage III status post resection followed by radioactive iodine;on synthroid.  Clinically stable. ° °#Clinically no evidence of recurrence.;  Thyroglobulin levels normal-pending.  ° °# Hypothyroidism /TSH suppression -currently on Synthoid 88 mcg [Goal: TSH for intermediate risk- 0.1-0.5]-given age/comorbidities recommend normal TSH.  TSH penidng.  ° °# Mild anemia hemoglobin 10.5 chronic-stable; iron studies- N. ? Etiology- STABLE. ° °# # I discussed regarding Covid precautions/and also discussed proceeding with Covid vaccination when available.  Discussed that unfortunately the data safety and efficacy of vaccination is unclear especially in patients with immunocompromised state.  However, I think the benefits of the vaccination outweigh the potential risks. ° ° °# DISPOSITION: will with results results-  °# follow up in  6months- MD/labs-CBC/CMP/Thyroid profile/ thyroglobulin by RIA- Dr.B °

## 2019-01-24 LAB — THYROGLOBULIN BY IMA: Thyroglobulin by IMA: <0.1 ng/mL — ABNORMAL LOW (ref 1.5–38.5)

## 2019-01-24 LAB — THYROID PANEL WITH TSH
Free Thyroxine Index: 2 (ref 1.2–4.9)
T3 Uptake Ratio: 28 % (ref 24–39)
T4, Total: 7.1 ug/dL (ref 4.5–12.0)
TSH: 3.44 u[IU]/mL (ref 0.450–4.500)

## 2019-01-24 LAB — TGAB+THYROGLOBULIN IMA OR RIA: Thyroglobulin Antibody: <1 IU/mL (ref 0.0–0.9)

## 2019-01-26 ENCOUNTER — Telehealth: Payer: Self-pay | Admitting: *Deleted

## 2019-01-26 NOTE — Telephone Encounter (Signed)
-----   Message from Cammie Sickle, MD sent at 01/25/2019  8:58 AM EST ----- H/T- please inform pt that her Thyroid numbers are normal. Continue current therapy. NO new recommendations. Follow up as planned.  GB

## 2019-01-26 NOTE — Telephone Encounter (Signed)
Patient states she missed a call from the [facility] and thought it might be regarding her results. Please call her back.

## 2019-01-26 NOTE — Telephone Encounter (Signed)
Patient returned phone call. Results reviewed.

## 2019-01-26 NOTE — Telephone Encounter (Signed)
Left vm for patient to return my phone call. 

## 2019-01-26 NOTE — Telephone Encounter (Signed)
Returned call to patient.- see rn phone note

## 2019-03-09 ENCOUNTER — Ambulatory Visit: Payer: Medicare Other | Attending: Internal Medicine

## 2019-03-09 ENCOUNTER — Other Ambulatory Visit: Payer: Self-pay | Admitting: *Deleted

## 2019-03-09 DIAGNOSIS — Z23 Encounter for immunization: Secondary | ICD-10-CM | POA: Insufficient documentation

## 2019-03-09 DIAGNOSIS — I2510 Atherosclerotic heart disease of native coronary artery without angina pectoris: Secondary | ICD-10-CM

## 2019-03-09 MED ORDER — CLOPIDOGREL BISULFATE 75 MG PO TABS: 75.0000 mg | ORAL_TABLET | Freq: Every day | ORAL | 2 refills | Status: DC

## 2019-03-09 NOTE — Progress Notes (Signed)
   Covid-19 Vaccination Clinic  Name:  [name]    MRN: [id number] DOB: [birth year]  03/09/2019  [name] was observed post Covid-19 immunization for 15 minutes without incidence. She was provided with Vaccine Information Sheet and instruction to access the V-Safe system.   [name] was instructed to call 911 with any severe reactions post vaccine: [place] Difficulty breathing  . Swelling of your face and throat  . A fast heartbeat  . A bad rash all over your body  . Dizziness and weakness    Immunizations Administered    Name Date Dose VIS Date Route   Pfizer COVID-19 Vaccine 03/09/2019 11:00 AM 0.3 mL 12/23/2018 Intramuscular   Manufacturer: [place]   Lot: [id number]   [place]: [id number]

## 2019-03-09 NOTE — Telephone Encounter (Signed)
Requested Prescriptions   Signed Prescriptions Disp Refills   clopidogrel (PLAVIX) 75 MG tablet 90 tablet 2    Sig: Take 1 tablet (75 mg total) by mouth daily.    Authorizing Provider: GOLLAN, TIMOTHY J    Ordering User: Jonessa Triplett C    

## 2019-04-05 ENCOUNTER — Ambulatory Visit: Payer: Medicare Other | Attending: Internal Medicine

## 2019-04-05 DIAGNOSIS — Z23 Encounter for immunization: Secondary | ICD-10-CM

## 2019-04-05 NOTE — Progress Notes (Signed)
   Covid-19 Vaccination Clinic  Name:  [name]    MRN: [id number] DOB: [birth year]  04/05/2019  [name] was observed post Covid-19 immunization for 15 minutes without incident. She was provided with Vaccine Information Sheet and instruction to access the V-Safe system.   [name] was instructed to call 911 with any severe reactions post vaccine: Marland Kitchen Difficulty breathing  . Swelling of face and throat  . A fast heartbeat  . A bad rash all over body  . Dizziness and weakness   Immunizations Administered    Name Date Dose VIS Date Route   Pfizer COVID-19 Vaccine 04/05/2019  2:06 PM 0.3 mL 12/23/2018 Intramuscular   Manufacturer: [place]   Lot: [id number]   [place]: [id number]

## 2019-05-16 ENCOUNTER — Other Ambulatory Visit: Payer: Self-pay | Admitting: Nurse Practitioner

## 2019-05-16 DIAGNOSIS — C73 Malignant neoplasm of thyroid gland: Secondary | ICD-10-CM

## 2019-05-22 ENCOUNTER — Other Ambulatory Visit: Payer: Self-pay | Admitting: *Deleted

## 2019-05-22 DIAGNOSIS — C73 Malignant neoplasm of thyroid gland: Secondary | ICD-10-CM

## 2019-05-22 NOTE — Telephone Encounter (Signed)
Thyroglobulin by IMA Order: [id number] Status:  Final result  Visible to patient:  No (inaccessible in [place])  Next appt:  06/05/2019 at 02:00 PM in Cardiology Ida Rogue, MD)  Ref Range & Units 3 mo ago  Thyroglobulin by IMA 1.5 - 38.5 ng/mL <0.1Low    Comment: (NOTE)  According to the [facility] of Clinical Biochemistry, the  reference interval for Thyroglobulin (TG) should be related to  euthyroid patients and not for patients who underwent  thyroidectomy. TG reference intervals for these patients depend on  the residual mass of the thyroid tissue left after surgery.  Establishing a post-operative baseline is recommended.  The assay limit of quantitation is 0.1 ng/mL  Thyroglobulin measured by [facility] Immunometric Assay  Performed At: [facility]  [place], [place] [id number]  Rush Farmer MD [id number]   Resulting Agency  [facility] CLIN LAB      Specimen Collected: 01/23/19 12:55 Last Resulted: 01/24/19 16:37         TgAb+Thyroglobulin IMA or RIA Order: [id number] Status:  Final result  Visible to patient:  No (inaccessible in [place])  Next appt:  06/05/2019 at 02:00 PM in Cardiology Ida Rogue, MD)  Dx:  Thyroid cancer ([place])  Ref Range & Units 3 mo ago  Thyroglobulin Antibody 0.0 - 0.9 IU/mL <1.0   Comment: (NOTE)  Thyroglobulin Antibody measured by [facility] Methodology  Performed At: [facility]  [place], [place] [id number]  Rush Farmer MD [id number]   Resulting Agency  [facility] CLIN LAB      Specimen Collected: 01/23/19 12:55 Last Resulted: 01/24/19 16:37     Lab Flowsheet   Order Details   View Encounter   Lab and Collection Details   Routing   Result History       Result Communications  Result Notes  Sabino Gasser RN  01/26/2019 11:03 AM EST    Left vm for patient to return my phone call to discuss test results   Cammie Sickle, MD  01/25/2019 8:58 AM EST     H/T- please inform pt that her Thyroid numbers are normal. Continue current therapy. NO new recommendations. Follow up as planned.  GB      Follow-ups   01/26/2019 Telephone      Other Results from 01/23/2019  Thyroid Panel With TSH  Status:  Final result  Visible to patient:  No (inaccessible in MyChart)  Next appt:  06/05/2019 at 02:00 PM in Cardiology Ida Rogue, MD)  Dx:  Thyroid cancer ([place]) Order: [id number]  Ref Range & Units 3 mo ago  TSH 0.450 - 4.500 uIU/mL 3.440   T4, Total 4.5 - 12.0 ug/dL 7.1   T3 Uptake Ratio 24 - 39 % 28   Free Thyroxine Index 1.2 - 4.9 2.0   Comment: (NOTE)  Performed At: [facility]  [address] [place], [place] [id number]  Rush Farmer MD [id number]   Resulting Agency  [facility] [place] CLIN LAB      Specimen Collected: 01/23/19 12:55 Last Resulted: 01/24/19 06:36

## 2019-05-23 MED ORDER — LEVOTHYROXINE SODIUM 88 MCG PO TABS: 88.0000 ug | ORAL_TABLET | Freq: Every day | ORAL | 0 refills | Status: DC

## 2019-06-03 NOTE — Progress Notes (Signed)
Cardiology Office Note  Date:  06/05/2019   ID:  [place], [place] 20-Apr-1933, MRN [id number]  PCP:  Idelle Crouch, MD   Chief Complaint  Patient presents with  . other    Pt. c/o LE edema with more on the left ankle. Meds reviewed by the pt. verbally.     HPI:  [name] is a 86 y/o female with PMH of  coronary artery disease,  cath May 2016 revealing severe LAD and RCA disease,  Percutaneous intervention and drug-eluting stent placement was performed to  the right coronary artery.The LAD was also felt to be amenable for PCI but it was felt that she would require a Rotablator. This was performed later at [facility] Jun 01, 2014 She presents today for routine follow-up for coronary artery disease  Feels well, Active,  Uses a walker  Does not cook She eats out every day,  drinks significant amount of water.  Bp good today No falls  No vertigo, dizziness No chest pain, angina  Stress at home, husband is getting weaker, no regular exercise program  Lab work reviewed and her  in detail Total chol 147, LDL 65  HBA1C 6.3 CR 0.82, BUN25  EKG personally reviewed by myself on todays visit Shows normal sinus rhythm rate 79 bpm nonspecific ST abnormality, no significant change from prior EKG  Past medical history reviewed emergency room September 27, 2017 for chest pain Cardiac enzymes negative, other work-up benign  Family stress, son and children  Carotid 2016 Findings consistent with 1-39 percent stenosis involving the right internal carotid artery and the left internal carotid artery.  Prior falls x 3 Mechanical, tripped on a mat, often stepping in the wrong place Very deconditioned, terrible gait instability, walks with a cane  Previous problems with gout, uric acid 8.1 On meloxican prn for pain  episode of garbled speech after PCI and MRI/A of the brain was performed and showed an acute, small, nonhemorrhagic infarct of the left caudate  with moderate small vessel disease. She did not have any acute neurologic deficits.  Carotid ultrasound was performed and showed bilateral 1-39% internal carotid artery stenosis.   Thyroid nodule found, status post ablation, currently on thyroid supplementation This was performed March 2016  PMH:   has a past medical history of CAD (coronary artery disease), Carotid artery disease ([place]), Chronic anxiety, Diverticulosis, Esophagitis, GERD (gastroesophageal reflux disease), History of colon polyps, History of echocardiogram, Hyperlipidemia, Hypertension, Hypothyroidism, Mild reactive airways disease, Osteoarthritis, PONV (postoperative nausea and vomiting), Rheumatoid arthritis ([place]), Stroke ([place]), Thyroid cancer ([place]), and Type 2 diabetes mellitus ([place]).  PSH:    Past Surgical History:  Procedure Laterality Date  . ABDOMINAL HYSTERECTOMY    . APPENDECTOMY    . AUGMENTATION MAMMAPLASTY    . BACK SURGERY    . BREAST IMPLANT REMOVAL  06/2001  . CARDIAC CATHETERIZATION N/A 06/06/2014   Procedure: Coronary/Graft Atherectomy;  Surgeon: Wellington Hampshire, MD;  Location: [place] [facility];  Service: Cardiovascular;  Laterality: N/A;  . CARDIAC CATHETERIZATION N/A 05/31/2014   Procedure: Left Heart Cath;  Surgeon: Minna Merritts, MD;  Location: [place] [facility];  Service: Cardiovascular;  Laterality: N/A;  . CARDIAC CATHETERIZATION N/A 05/31/2014   Procedure: Coronary Stent Intervention;  Surgeon: Wellington Hampshire, MD;  Location: [place] [facility];  Service: Cardiovascular;  Laterality: N/A;  . CARPAL TUNNEL RELEASE     "I've have a total of 7" (06/06/2014)  . CATARACT EXTRACTION W/ INTRAOCULAR  LENS  IMPLANT, BILATERAL Bilateral   . CHOLECYSTECTOMY    . CORONARY ANGIOPLASTY WITH STENT PLACEMENT  05/31/2014; 06/06/2014   "2; 1"  . GANGLION CYST EXCISION Right    AC joint  . LUMBAR LAMINECTOMY  X 6  . MENISCUS REPAIR Bilateral    "2 on the right; 1 on the left"  . ROTATOR CUFF REPAIR     "I  think 2 left, 2 right"  . THYROIDECTOMY    . TONSILLECTOMY    . TUBAL LIGATION      Current Outpatient Medications  Medication Sig Dispense Refill  . allopurinol (ZYLOPRIM) 100 MG tablet TAKE 1 TABLET(100 MG) BY MOUTH TWICE DAILY 60 tablet 3  . ALPRAZolam (XANAX) 0.25 MG tablet Take 0.25 mg by mouth at bedtime as needed for anxiety or sleep.     . Cholecalciferol 1000 UNITS tablet Take 1,000 Units by mouth daily.     . clopidogrel (PLAVIX) 75 MG tablet Take 1 tablet (75 mg total) by mouth daily. 90 tablet 2  . Colchicine (MITIGARE) 0.6 MG CAPS Take 0.6 mg by mouth daily. 30 capsule 4  . ezetimibe (ZETIA) 10 MG tablet Take 1 tablet (10 mg total) by mouth daily. 90 tablet 3  . furosemide (LASIX) 20 MG tablet Take 1 tablet (20 mg total) by mouth daily as needed (As needed for leg swelling take potassium with this.). 90 tablet 3  . levothyroxine (SYNTHROID) 88 MCG tablet Take 1 tablet (88 mcg total) by mouth daily before breakfast. 90 tablet 0  . metFORMIN (GLUCOPHAGE) 500 MG tablet Take 500 mg by mouth 2 (two) times a day.    . metoprolol succinate (TOPROL XL) 50 MG 24 hr tablet Take 1 tablet (50 mg total) by mouth daily. 90 tablet 3  . Multiple Vitamins-Minerals (ICAPS AREDS 2 PO) Take by mouth.    . nitroGLYCERIN (NITROSTAT) 0.4 MG SL tablet PLACE 1 TABLET UNDER THE TONGUE EVERY 5 MINUTES AS NEEDED FOR CHEST PAIN 25 tablet 0  . pantoprazole (PROTONIX) 40 MG tablet TAKE 1 TABLET BY MOUTH DAILY 90 tablet 2  . potassium chloride (K-DUR) 10 MEQ tablet Take 1 tablet (10 mEq total) by mouth daily as needed (As needed with Furosemide.). 90 tablet 3  . ramipril (ALTACE) 10 MG capsule TAKE 1 CAPSULE(10 MG) BY MOUTH DAILY 30 capsule 3  . rosuvastatin (CRESTOR) 5 MG tablet Take 1 tablet (5 mg total) by mouth daily. 90 tablet 2  . silver sulfADIAZINE (SILVADENE) 1 % cream Apply 1 application topically daily. 50 g 0  . Thiamine HCl (VITAMIN B-1 PO) Take by mouth daily.    . traMADol-acetaminophen  (ULTRACET) 37.5-325 MG tablet Take 1 tablet by mouth every 8 (eight) hours as needed. 12 tablet 0  . VITAMIN E PO Take by mouth daily.     No current facility-administered medications for this visit.    Allergies:   Ciprofloxacin, Codeine, Metronidazole, Other, Sulfa antibiotics, Neomycin, Neomycin-bacitracin zn-polymyx, Bacitracin-polymyxin b, Celecoxib, Petrolatum-zinc oxide, Statins, and Vioxx [rofecoxib]   Social History:  The patient  reports that she has never smoked. She has never used smokeless tobacco. She reports that she does not drink alcohol or use drugs.   Family History:   family history includes Heart disease in her sister.    Review of Systems: Review of Systems  Constitutional: Negative.   HENT: Negative.   Respiratory: Negative.   Cardiovascular: Negative.   Gastrointestinal: Negative.   Musculoskeletal: Negative.   Neurological: Negative.   Psychiatric/Behavioral:  Negative.   All other systems reviewed and are negative.   PHYSICAL EXAM: VS:  BP (!) 136/54 (BP Location: Left Arm, Patient Position: Sitting, Cuff Size: Normal)   Pulse 79   Ht 5\' 2"  (1.575 m)   Wt 148 lb 2 oz (67.2 kg)   SpO2 94%   BMI 27.09 kg/m  , BMI Body mass index is 27.09 kg/m. Constitutional:  oriented to person, place, and time. No distress.  HENT:  Head: Grossly normal Eyes:  no discharge. No scleral icterus.  Neck: No JVD, no carotid bruits  Cardiovascular: Regular rate and rhythm, no murmurs appreciated Pulmonary/Chest: Clear to auscultation bilaterally, no wheezes or rails Abdominal: Soft.  no distension.  no tenderness.  Musculoskeletal: Normal range of motion Neurological:  normal muscle tone. Coordination normal. No atrophy Skin: Skin warm and dry Psychiatric: normal affect, pleasant   Recent Labs: 01/23/2019: ALT 17; BUN 25; Creatinine, Ser 0.82; Hemoglobin 10.9; Platelets 191; Potassium 4.0; Sodium 140; TSH 3.440    Lipid Panel Lab Results  Component Value Date    CHOL 151 08/14/2014   HDL 42 08/14/2014   LDLCALC 67 08/14/2014   TRIG 209 (H) 08/14/2014      Wt Readings from Last 3 Encounters:  06/05/19 148 lb 2 oz (67.2 kg)  01/23/19 150 lb (68 kg)  12/20/18 144 lb (65.3 kg)       ASSESSMENT AND PLAN:  Near syncope No orthostasis symptoms, blood pressure stable No further work-up at this time  Coronary artery disease involving native coronary artery of native heart without angina pectoris Imdur previously held secondary to headaches Blood pressure stable, no underlying anginal symptoms  Mixed hyperlipidemia Continue Crestor 5 mg daily  Cholesterol running higher, was not taking Zetia, medication has been refilled  Essential hypertension Blood pressure is well controlled on today's visit. No changes made to the medications.  Falls Several prior falls,  None recently Discussed need to continue her walking program  Type 2 diabetes mellitus with other circulatory complication, without long-term current use of insulin (HCC)  hemoglobin A1c 6.3 We have encouraged continued exercise, careful diet management in an effort to lose weight.  Anemia chronic issue Monitored by primary care    Total encounter time more than 25 minutes  Greater than 50% was spent in counseling and coordination of care with the patient   Disposition:   F/U  12 months   Orders Placed This Encounter  Procedures  . EKG 12-Lead     Signed, Esmond Plants, M.D., Ph.D. 06/05/2019  [place], [place] \

## 2019-06-05 ENCOUNTER — Encounter: Payer: Self-pay | Admitting: Cardiovascular Disease

## 2019-06-05 ENCOUNTER — Ambulatory Visit (INDEPENDENT_AMBULATORY_CARE_PROVIDER_SITE_OTHER): Payer: Medicare Other | Admitting: Cardiovascular Disease

## 2019-06-05 ENCOUNTER — Other Ambulatory Visit: Payer: Self-pay

## 2019-06-05 VITALS — BP 136/54 | HR 79 | Ht 62.0 in | Wt 148.1 lb

## 2019-06-05 DIAGNOSIS — I6523 Occlusion and stenosis of bilateral carotid arteries: Secondary | ICD-10-CM | POA: Diagnosis not present

## 2019-06-05 DIAGNOSIS — E782 Mixed hyperlipidemia: Secondary | ICD-10-CM

## 2019-06-05 DIAGNOSIS — I739 Peripheral vascular disease, unspecified: Secondary | ICD-10-CM | POA: Diagnosis not present

## 2019-06-05 DIAGNOSIS — I639 Cerebral infarction, unspecified: Secondary | ICD-10-CM

## 2019-06-05 DIAGNOSIS — E1159 Type 2 diabetes mellitus with other circulatory complications: Secondary | ICD-10-CM | POA: Diagnosis not present

## 2019-06-05 DIAGNOSIS — I10 Essential (primary) hypertension: Secondary | ICD-10-CM

## 2019-06-05 DIAGNOSIS — I25118 Atherosclerotic heart disease of native coronary artery with other forms of angina pectoris: Secondary | ICD-10-CM | POA: Diagnosis not present

## 2019-06-05 MED ORDER — EZETIMIBE 10 MG PO TABS: 10.0000 mg | ORAL_TABLET | Freq: Every day | ORAL | 3 refills | Status: DC

## 2019-06-05 NOTE — Patient Instructions (Signed)
Medication Instructions:  Restart zetia one a day  If you need a refill on your cardiac medications before your next appointment, please call your pharmacy.    Lab work: No new labs needed   If you have labs (blood work) drawn today and your tests are completely normal, you will receive your results only by: Marland Kitchen MyChart Message (if you have MyChart) OR . A paper copy in the mail If you have any lab test that is abnormal or we need to change your treatment, we will call you to review the results.   Testing/Procedures: No new testing needed   Follow-Up: At [facility], you and your health needs are our priority.  As part of our continuing mission to provide you with exceptional heart care, we have created designated Provider Care Teams.  These Care Teams include your primary Cardiologist (physician) and Advanced Practice Providers (APPs -  Physician Assistants and Nurse Practitioners) who all work together to provide you with the care you need, when you need it.  . You will need a follow up appointment in 6 months   . Providers on your designated Care Team:   . Murray Hodgkins, NP . Christell Faith, PA-C . Marrianne Mood, PA-C  Any Other Special Instructions Will Be Listed Below (If Applicable).  COVID-19 Vaccine Information can be found at: [email or web address] For questions related to vaccine distribution or appointments, please email [email or web address] or call 7706387586.

## 2019-07-24 ENCOUNTER — Inpatient Hospital Stay: Payer: Medicare Other | Attending: Internal Medicine

## 2019-07-24 ENCOUNTER — Inpatient Hospital Stay: Payer: Medicare Other | Admitting: Internal Medicine

## 2019-07-24 NOTE — Progress Notes (Deleted)
[place] OFFICE PROGRESS NOTE  Patient Care Team: Idelle Crouch, MD as PCP - General (Internal Medicine) Minna Merritts, MD as Consulting Physician (Cardiology) Cammie Sickle, MD as Medical Oncologist (Medical Oncology)  Cancer Staging Thyroid cancer [facility]) Staging form: Thyroid - Papillary or Follicular, AJCC 7th Edition - Clinical stage from 03/22/2014: Stage III (T3, N0, M0) - Signed by Cammie Sickle, MD on 10/22/2015 Staging form: Thyroid - Papillary or Follicular (Under 45 years), AJCC 7th Edition - Pathologic: No stage assigned - Unsigned    Oncology History Overview Note  1. 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  0.5 cm tumor extending into perithyroid soft tissue status post total thyroidectomy;   One lymph node was negative.  No angiolymphatic invasion seen [Dr.McQueen]; Intermediate risk of recurrence- goal TSH 0.1- 0.5  # Status post radioactive iodine therapy in May of 2016.  # On Synthroid    Thyroid cancer ([place])      INTERVAL HISTORY:  [name] 86 y.o.  female pleasant patient above history of Stage III papillary thyroid carcinoma is here for follow-up.   In the interim patient's daughter and granddaughter had Covid infection.  Patient did not have any contact or have any infections.  Patient denies any swelling in the neck. Denies any lumps in the neck; or in the underarms.No tremors. No palpitations.  Review of Systems  Constitutional: Negative for chills, diaphoresis, fever, malaise/fatigue and weight loss.  HENT: Negative for nosebleeds and sore throat.   Eyes: Negative for double vision.  Respiratory: Negative for cough, hemoptysis, sputum production, shortness of breath and wheezing.   Cardiovascular: Negative for chest pain, palpitations, orthopnea and leg swelling.  Gastrointestinal: Negative for abdominal pain, blood in stool, constipation,  diarrhea, heartburn, melena, nausea and vomiting.  Genitourinary: Negative for dysuria, frequency and urgency.  Musculoskeletal: Negative for back pain and joint pain.  Skin: Negative.  Negative for itching and rash.  Neurological: Negative for dizziness, tingling, focal weakness, weakness and headaches.  Endo/Heme/Allergies: Does not bruise/bleed easily.  Psychiatric/Behavioral: Negative for depression. The patient is not nervous/anxious and does not have insomnia.      PAST MEDICAL HISTORY :  Past Medical History:  Diagnosis Date  . CAD (coronary artery disease)    a. lexiscan 08/2013: nl wall motion, no ischemia, EF 50-55%; b. cardiac cath 05/31/2014: mLAD 90%, dLAD 50%, dLCx 60%, 1st Mrg 80%, pRCA 60% s/p PCI/DES, mRCA 1st lesion 70%, mRCA 2nd 95% s/p PCI/long DES to cover entire mRCA, RPLB 40%;  c. 05/2014 Staged PCI of LAD w/ 2.5 x 15 mm Xience Alpine DES.  . Carotid artery disease ([place])   . Chronic anxiety   . Diverticulosis   . Esophagitis   . GERD (gastroesophageal reflux disease)   . History of colon polyps   . History of echocardiogram    a. 08/2013: normal LVSF, nl RVSF, no valvular regurgitation or stenosis   . Hyperlipidemia   . Hypertension   . Hypothyroidism   . Mild reactive airways disease   . Osteoarthritis   . PONV (postoperative nausea and vomiting)   . Rheumatoid arthritis ([place])   . Stroke [facility])    a. 05/2014 pt c/o garbled speech following cath 5/19. MRI/A 5/26 showed left caudate infarct->conservative mgmt per neuro.  . Thyroid cancer ([place])    a. s/p right sided hemi-thyroidectomy; b. planning for radioactive iodine tx; c. followed by Dr. Marcelene [place]te  .  Type 2 diabetes mellitus ([place])     PAST SURGICAL HISTORY :   Past Surgical History:  Procedure Laterality Date  . ABDOMINAL HYSTERECTOMY    . APPENDECTOMY    . AUGMENTATION MAMMAPLASTY    . BACK SURGERY    . BREAST IMPLANT REMOVAL  06/2001  . CARDIAC CATHETERIZATION N/A 06/06/2014   Procedure: Coronary/Graft  Atherectomy;  Surgeon: Wellington Hampshire, MD;  Location: [place] CV LAB;  Service: Cardiovascular;  Laterality: N/A;  . CARDIAC CATHETERIZATION N/A 05/31/2014   Procedure: Left Heart Cath;  Surgeon: Minna Merritts, MD;  Location: [place] CV LAB;  Service: Cardiovascular;  Laterality: N/A;  . CARDIAC CATHETERIZATION N/A 05/31/2014   Procedure: Coronary Stent Intervention;  Surgeon: Wellington Hampshire, MD;  Location: [place] CV LAB;  Service: Cardiovascular;  Laterality: N/A;  . CARPAL TUNNEL RELEASE     "I've have a total of 7" (06/06/2014)  . CATARACT EXTRACTION W/ INTRAOCULAR LENS  IMPLANT, BILATERAL Bilateral   . CHOLECYSTECTOMY    . CORONARY ANGIOPLASTY WITH STENT PLACEMENT  05/31/2014; 06/06/2014   "2; 1"  . GANGLION CYST EXCISION Right    AC joint  . LUMBAR LAMINECTOMY  X 6  . MENISCUS REPAIR Bilateral    "2 on the right; 1 on the left"  . ROTATOR CUFF REPAIR     "I think 2 left, 2 right"  . THYROIDECTOMY    . TONSILLECTOMY    . TUBAL LIGATION      FAMILY HISTORY :   Family History  Problem Relation Age of Onset  . Heart disease Sister        stent placed x 3     SOCIAL HISTORY:   Social History   Tobacco Use  . Smoking status: Never Smoker  . Smokeless tobacco: Never Used  Substance Use Topics  . Alcohol use: No  . Drug use: No    ALLERGIES:  is allergic to ciprofloxacin, codeine, metronidazole, other, sulfa antibiotics, neomycin, neomycin-bacitracin zn-polymyx, bacitracin-polymyxin b, celecoxib, petrolatum-zinc oxide, statins, and vioxx [rofecoxib].  MEDICATIONS:  Current Outpatient Medications  Medication Sig Dispense Refill  . allopurinol (ZYLOPRIM) 100 MG tablet TAKE 1 TABLET(100 MG) BY MOUTH TWICE DAILY 60 tablet 3  . ALPRAZolam (XANAX) 0.25 MG tablet Take 0.25 mg by mouth at bedtime as needed for anxiety or sleep.     . Cholecalciferol 1000 UNITS tablet Take 1,000 Units by mouth daily.     . clopidogrel (PLAVIX) 75 MG tablet Take 1 tablet (75 mg  total) by mouth daily. 90 tablet 2  . Colchicine (MITIGARE) 0.6 MG CAPS Take 0.6 mg by mouth daily. 30 capsule 4  . ezetimibe (ZETIA) 10 MG tablet Take 1 tablet (10 mg total) by mouth daily. 90 tablet 3  . furosemide (LASIX) 20 MG tablet Take 1 tablet (20 mg total) by mouth daily as needed (As needed for leg swelling take potassium with this.). 90 tablet 3  . levothyroxine (SYNTHROID) 88 MCG tablet Take 1 tablet (88 mcg total) by mouth daily before breakfast. 90 tablet 0  . metFORMIN (GLUCOPHAGE) 500 MG tablet Take 500 mg by mouth 2 (two) times a day.    . metoprolol succinate (TOPROL XL) 50 MG 24 hr tablet Take 1 tablet (50 mg total) by mouth daily. 90 tablet 3  . Multiple Vitamins-Minerals (ICAPS AREDS 2 PO) Take by mouth.    . nitroGLYCERIN (NITROSTAT) 0.4 MG SL tablet PLACE 1 TABLET UNDER THE TONGUE EVERY 5 MINUTES AS NEEDED FOR  CHEST PAIN 25 tablet 0  . pantoprazole (PROTONIX) 40 MG tablet TAKE 1 TABLET BY MOUTH DAILY 90 tablet 2  . potassium chloride (K-DUR) 10 MEQ tablet Take 1 tablet (10 mEq total) by mouth daily as needed (As needed with Furosemide.). 90 tablet 3  . ramipril (ALTACE) 10 MG capsule TAKE 1 CAPSULE(10 MG) BY MOUTH DAILY 30 capsule 3  . rosuvastatin (CRESTOR) 5 MG tablet Take 1 tablet (5 mg total) by mouth daily. 90 tablet 2  . silver sulfADIAZINE (SILVADENE) 1 % cream Apply 1 application topically daily. 50 g 0  . Thiamine HCl (VITAMIN B-1 PO) Take by mouth daily.    . traMADol-acetaminophen (ULTRACET) 37.5-325 MG tablet Take 1 tablet by mouth every 8 (eight) hours as needed. 12 tablet 0  . VITAMIN E PO Take by mouth daily.     No current facility-administered medications for this visit.    PHYSICAL EXAMINATION: ECOG PERFORMANCE STATUS: 0 - Asymptomatic  There were no vitals taken for this visit.  There were no vitals filed for this visit.  Physical Exam HENT:     Head: Normocephalic and atraumatic.     Mouth/Throat:     Pharynx: No oropharyngeal exudate.   Eyes:     Pupils: Pupils are equal, round, and reactive to light.  Cardiovascular:     Rate and Rhythm: Normal rate and regular rhythm.  Pulmonary:     Effort: No respiratory distress.     Breath sounds: No wheezing.  Abdominal:     General: Bowel sounds are normal. There is no distension.     Palpations: Abdomen is soft. There is no mass.     Tenderness: There is no abdominal tenderness. There is no guarding or rebound.  Musculoskeletal:        General: No tenderness. Normal range of motion.     Cervical back: Normal range of motion and neck supple.  Skin:    General: Skin is warm.  Neurological:     Mental Status: She is alert and oriented to person, place, and time.  Psychiatric:        Mood and Affect: Affect normal.      LABORATORY DATA:  I have reviewed the data as listed    Component Value Date/Time   NA 140 01/23/2019 1255   NA 139 05/04/2014 1058   K 4.0 01/23/2019 1255   K 4.5 05/04/2014 1058   CL 109 01/23/2019 1255   CL 105 05/04/2014 1058   CO2 21 (L) 01/23/2019 1255   CO2 26 05/04/2014 1058   GLUCOSE 122 (H) 01/23/2019 1255   GLUCOSE 161 (H) 05/04/2014 1058   BUN 25 (H) 01/23/2019 1255   BUN 20 05/04/2014 1058   CREATININE 0.82 01/23/2019 1255   CREATININE 1.10 (H) 05/04/2014 1058   CALCIUM 8.9 01/23/2019 1255   CALCIUM 9.5 05/04/2014 1058   PROT 6.3 (L) 01/23/2019 1255   PROT 6.9 05/04/2014 1058   ALBUMIN 3.8 01/23/2019 1255   ALBUMIN 4.1 05/04/2014 1058   AST 22 01/23/2019 1255   AST 26 05/04/2014 1058   ALT 17 01/23/2019 1255   ALT 17 05/04/2014 1058   ALKPHOS 55 01/23/2019 1255   ALKPHOS 52 05/04/2014 1058   BILITOT 0.6 01/23/2019 1255   BILITOT 0.6 05/04/2014 1058   GFRNONAA >60 01/23/2019 1255   GFRNONAA 48 (L) 05/04/2014 1058   GFRAA >60 01/23/2019 1255   GFRAA 56 (L) 05/04/2014 1058    No results found for: SPEP, UPEP  Lab  Results  Component Value Date   WBC 6.7 01/23/2019   NEUTROABS 5.0 01/23/2019   HGB 10.9 (L) 01/23/2019    HCT 35.4 (L) 01/23/2019   MCV 96.2 01/23/2019   PLT 191 01/23/2019      Chemistry      Component Value Date/Time   NA 140 01/23/2019 1255   NA 139 05/04/2014 1058   K 4.0 01/23/2019 1255   K 4.5 05/04/2014 1058   CL 109 01/23/2019 1255   CL 105 05/04/2014 1058   CO2 21 (L) 01/23/2019 1255   CO2 26 05/04/2014 1058   BUN 25 (H) 01/23/2019 1255   BUN 20 05/04/2014 1058   CREATININE 0.82 01/23/2019 1255   CREATININE 1.10 (H) 05/04/2014 1058      Component Value Date/Time   CALCIUM 8.9 01/23/2019 1255   CALCIUM 9.5 05/04/2014 1058   ALKPHOS 55 01/23/2019 1255   ALKPHOS 52 05/04/2014 1058   AST 22 01/23/2019 1255   AST 26 05/04/2014 1058   ALT 17 01/23/2019 1255   ALT 17 05/04/2014 1058   BILITOT 0.6 01/23/2019 1255   BILITOT 0.6 05/04/2014 1058       RADIOGRAPHIC STUDIES: I have personally reviewed the radiological images as listed and agreed with the findings in the report. No results found.   ASSESSMENT & PLAN:  No problem-specific Assessment & Plan notes found for this encounter.   No orders of the defined types were placed in this encounter.  All questions were answered. The patient knows to call the clinic with any problems, questions or concerns.      Cammie Sickle, MD 07/24/2019 12:39 PM

## 2019-07-24 NOTE — Assessment & Plan Note (Deleted)
Thyroid cancer- stage III status post resection followed by radioactive iodine;on synthroid.  Clinically stable.  #Clinically no evidence of recurrence.;  Thyroglobulin levels normal-pending.   # Hypothyroidism /TSH suppression -currently on Synthoid 88 mcg [Goal: TSH for intermediate risk- 0.1-0.5]-given age/comorbidities recommend normal TSH.  TSH penidng.   # Mild anemia hemoglobin 10.5 chronic-stable; iron studies- N. ? Etiology- STABLE.  # # I discussed regarding Covid precautions/and also discussed proceeding with Covid vaccination when available.  Discussed that unfortunately the data safety and efficacy of vaccination is unclear especially in patients with immunocompromised state.  However, I think the benefits of the vaccination outweigh the potential risks.   # DISPOSITION: will with results results-  # follow up in  40months- MD/labs-CBC/CMP/Thyroid profile/ thyroglobulin by RIA- Dr.B

## 2019-07-29 ENCOUNTER — Other Ambulatory Visit: Payer: Self-pay | Admitting: Cardiovascular Disease

## 2019-08-12 ENCOUNTER — Other Ambulatory Visit: Payer: Self-pay | Admitting: Cardiovascular Disease

## 2019-08-14 ENCOUNTER — Other Ambulatory Visit: Payer: Self-pay | Admitting: Internal Medicine

## 2019-08-14 DIAGNOSIS — C73 Malignant neoplasm of thyroid gland: Secondary | ICD-10-CM

## 2019-08-14 NOTE — Telephone Encounter (Signed)
TgAb+Thyroglobulin IMA or RIA Order: [id number] Status:  Final result Visible to patient:  No (inaccessible in [place]) Next appt:  12/12/2019 at 01:40 PM in Cardiology Ida Rogue, MD) Dx:  Thyroid cancer ([place])  2 Result Notes   1 Follow-up Encounter  Ref Range & Units 6 mo ago 1 yr ago  Thyroglobulin Antibody 0.0 - 0.9 IU/mL <1.0  <1.0 CM   Comment: (NOTE)  Thyroglobulin Antibody measured by [facility] Methodology  Performed At: [facility]  [place], [place] [id number]  Rush Farmer MD [id number]   Resulting Agency  [facility] CLIN LAB [facility] CLIN LAB      Specimen Collected: 01/23/19 12:55 Last Resulted: 01/24/19 16:37        Follow-up Encounter  Ref Range & Units 6 mo ago 1 yr ago  TSH 0.450 - 4.500 uIU/mL 3.440  1.460   T4, Total 4.5 - 12.0 ug/dL 7.1  7.9   T3 Uptake Ratio 24 - 39 % 28  28   Free Thyroxine Index 1.2 - 4.9 2.0  2.2 CM   Comment: (NOTE)  Performed At: [facility]  [address] [place], [place] [id number]  Rush Farmer MD [id number]:[id number]   Resulting Agency  [facility] CLIN LAB [facility] CLIN LAB      Specimen Collected: 01/23/19 12:55 Last Resulted: 01/24/19 06:36

## 2019-08-23 ENCOUNTER — Other Ambulatory Visit: Payer: Self-pay | Admitting: Cardiovascular Disease

## 2019-09-24 ENCOUNTER — Other Ambulatory Visit: Payer: Self-pay | Admitting: Cardiovascular Disease

## 2019-09-24 DIAGNOSIS — I2510 Atherosclerotic heart disease of native coronary artery without angina pectoris: Secondary | ICD-10-CM

## 2019-10-30 ENCOUNTER — Other Ambulatory Visit: Payer: Self-pay | Admitting: Cardiovascular Disease

## 2019-11-10 ENCOUNTER — Other Ambulatory Visit: Payer: Self-pay | Admitting: Internal Medicine

## 2019-11-10 DIAGNOSIS — C73 Malignant neoplasm of thyroid gland: Secondary | ICD-10-CM

## 2019-11-10 NOTE — Telephone Encounter (Signed)
Thyroid Panel With TSH Order: [id number] Status:  Final result Visible to patient:  No (inaccessible in [place]) Next appt:  12/12/2019 at 01:40 PM in Cardiology Ida Rogue, MD) Dx:  Thyroid cancer ([place])  2 Result Notes   1 Follow-up Encounter  Ref Range & Units 9 mo ago 1 yr ago  TSH 0.450 - 4.500 uIU/mL 3.440  1.460   T4, Total 4.5 - 12.0 ug/dL 7.1  7.9   T3 Uptake Ratio 24 - 39 % 28  28   Free Thyroxine Index 1.2 - 4.9 2.0  2.2 CM   Comment: (NOTE)  Performed At: [facility]  [address] [place], [place] [id number]  Rush Farmer MD YD:[id number]   Resulting Agency  [facility] CLIN LAB [facility] CLIN LAB      Specimen Collected: 01/23/19 12:55 Last Resulted: 01/24/19 06:36       TgAb+Thyroglobulin IMA or RIA  Status:  Final result Visible to patient:  No (inaccessible in MyChart) Next appt:  12/12/2019 at 01:40 PM in Cardiology Ida Rogue, MD) Dx:  Thyroid cancer [facility]) Order: [id number]  2 Result Notes   1 Follow-up Encounter  Ref Range & Units 9 mo ago 1 yr ago  Thyroglobulin Antibody 0.0 - 0.9 IU/mL <1.0  <1.0 CM   Comment: (NOTE)  Thyroglobulin Antibody measured by [facility] Methodology  Performed At: [facility]  [address] [place], [place] [id number]  Rush Farmer MD [id number]   Resulting Agency  [facility] CLIN LAB [facility] CLIN LAB      Specimen Collected: 01/23/19 12:55 Last Resulted: 01/24/19 16:37

## 2019-11-25 ENCOUNTER — Other Ambulatory Visit: Payer: Self-pay | Admitting: Cardiovascular Disease

## 2019-12-11 NOTE — Progress Notes (Deleted)
Cardiology Office Note  Date:  12/11/2019   ID:  [place], [place] May 24, 1933, MRN [id number]  PCP:  Idelle Crouch, MD   No chief complaint on file.   HPI:  [name] is a 86 y/o female with PMH of  coronary artery disease,  cath May 2016 revealing severe LAD and RCA disease,  Percutaneous intervention and drug-eluting stent placement was performed to  the right coronary artery.The LAD was also felt to be amenable for PCI but it was felt that she would require a Rotablator. This was performed later at [facility] Jun 01, 2014 She presents today for routine follow-up for coronary artery disease  Feels well, Active,  Uses a walker  Does not cook She eats out every day,  drinks significant amount of water.  Bp good today No falls  No vertigo, dizziness No chest pain, angina  Stress at home, husband is getting weaker, no regular exercise program  Lab work reviewed and her  in detail Total chol 147, LDL 65  HBA1C 6.3 CR 0.82, BUN25  EKG personally reviewed by myself on todays visit Shows normal sinus rhythm rate 79 bpm nonspecific ST abnormality, no significant change from prior EKG  Past medical history reviewed emergency room September 27, 2017 for chest pain Cardiac enzymes negative, other work-up benign  Family stress, son and children  Carotid 2016 Findings consistent with 1-39 percent stenosis involving the right internal carotid artery and the left internal carotid artery.  Prior falls x 3 Mechanical, tripped on a mat, often stepping in the wrong place Very deconditioned, terrible gait instability, walks with a cane  Previous problems with gout, uric acid 8.1 On meloxican prn for pain  episode of garbled speech after PCI and MRI/A of the brain was performed and showed an acute, small, nonhemorrhagic infarct of the left caudate with moderate small vessel disease. She did not have any acute neurologic deficits.  Carotid ultrasound was  performed and showed bilateral 1-39% internal carotid artery stenosis.   Thyroid nodule found, status post ablation, currently on thyroid supplementation This was performed March 2016  PMH:   has a past medical history of CAD (coronary artery disease), Carotid artery disease ([place]), Chronic anxiety, Diverticulosis, Esophagitis, GERD (gastroesophageal reflux disease), History of colon polyps, History of echocardiogram, Hyperlipidemia, Hypertension, Hypothyroidism, Mild reactive airways disease, Osteoarthritis, PONV (postoperative nausea and vomiting), Rheumatoid arthritis ([place]), Stroke ([place]), Thyroid cancer ([place]), and Type 2 diabetes mellitus ([place]).  PSH:    Past Surgical History:  Procedure Laterality Date  . ABDOMINAL HYSTERECTOMY    . APPENDECTOMY    . AUGMENTATION MAMMAPLASTY    . BACK SURGERY    . BREAST IMPLANT REMOVAL  06/2001  . CARDIAC CATHETERIZATION N/A 06/06/2014   Procedure: Coronary/Graft Atherectomy;  Surgeon: Wellington Hampshire, MD;  Location: [place] [facility] LAB;  Service: Cardiovascular;  Laterality: N/A;  . CARDIAC CATHETERIZATION N/A 05/31/2014   Procedure: Left Heart Cath;  Surgeon: Minna Merritts, MD;  Location: [place] CV LAB;  Service: Cardiovascular;  Laterality: N/A;  . CARDIAC CATHETERIZATION N/A 05/31/2014   Procedure: Coronary Stent Intervention;  Surgeon: Wellington Hampshire, MD;  Location: [place] [facility] LAB;  Service: Cardiovascular;  Laterality: N/A;  . CARPAL TUNNEL RELEASE     "I've have a total of 7" (06/06/2014)  . CATARACT EXTRACTION W/ INTRAOCULAR LENS  IMPLANT, BILATERAL Bilateral   . CHOLECYSTECTOMY    . CORONARY ANGIOPLASTY WITH STENT PLACEMENT  05/31/2014; 06/06/2014   "2; 1"  .  GANGLION CYST EXCISION Right    AC joint  . LUMBAR LAMINECTOMY  X 6  . MENISCUS REPAIR Bilateral    "2 on the right; 1 on the left"  . ROTATOR CUFF REPAIR     "I think 2 left, 2 right"  . THYROIDECTOMY    . TONSILLECTOMY    . TUBAL LIGATION      Current Outpatient  Medications  Medication Sig Dispense Refill  . allopurinol (ZYLOPRIM) 100 MG tablet TAKE 1 TABLET(100 MG) BY MOUTH TWICE DAILY 60 tablet 3  . ALPRAZolam (XANAX) 0.25 MG tablet Take 0.25 mg by mouth at bedtime as needed for anxiety or sleep.     . Cholecalciferol 1000 UNITS tablet Take 1,000 Units by mouth daily.     . clopidogrel (PLAVIX) 75 MG tablet TAKE 1 TABLET BY MOUTH  DAILY 90 tablet 3  . Colchicine (MITIGARE) 0.6 MG CAPS Take 0.6 mg by mouth daily. 30 capsule 4  . ezetimibe (ZETIA) 10 MG tablet Take 1 tablet (10 mg total) by mouth daily. 90 tablet 3  . furosemide (LASIX) 20 MG tablet Take 1 tablet (20 mg total) by mouth daily as needed (As needed for leg swelling take potassium with this.). 90 tablet 3  . levothyroxine (SYNTHROID) 88 MCG tablet TAKE 1 TABLET(88 MCG) BY MOUTH DAILY BEFORE BREAKFAST 90 tablet 0  . metFORMIN (GLUCOPHAGE) 500 MG tablet Take 500 mg by mouth 2 (two) times a day.    . metoprolol succinate (TOPROL XL) 50 MG 24 hr tablet Take 1 tablet (50 mg total) by mouth daily. 90 tablet 3  . Multiple Vitamins-Minerals (ICAPS AREDS 2 PO) Take by mouth.    . nitroGLYCERIN (NITROSTAT) 0.4 MG SL tablet PLACE 1 TABLET UNDER THE TONGUE EVERY 5 MINUTES AS NEEDED FOR CHEST PAIN 25 tablet 0  . pantoprazole (PROTONIX) 40 MG tablet TAKE 1 TABLET BY MOUTH DAILY 90 tablet 2  . potassium chloride (K-DUR) 10 MEQ tablet Take 1 tablet (10 mEq total) by mouth daily as needed (As needed with Furosemide.). 90 tablet 3  . ramipril (ALTACE) 10 MG capsule TAKE 1 CAPSULE(10 MG) BY MOUTH DAILY 30 capsule 3  . rosuvastatin (CRESTOR) 5 MG tablet Take 1 tablet (5 mg total) by mouth daily. Please call office to schedule appointment for May. 90 tablet 1  . silver sulfADIAZINE (SILVADENE) 1 % cream Apply 1 application topically daily. 50 g 0  . Thiamine HCl (VITAMIN B-1 PO) Take by mouth daily.    . traMADol-acetaminophen (ULTRACET) 37.5-325 MG tablet Take 1 tablet by mouth every 8 (eight) hours as needed.  12 tablet 0  . VITAMIN E PO Take by mouth daily.     No current facility-administered medications for this visit.    Allergies:   Ciprofloxacin, Codeine, Metronidazole, Other, Sulfa antibiotics, Neomycin, Neomycin-bacitracin zn-polymyx, Bacitracin-polymyxin b, Celecoxib, Petrolatum-zinc oxide, Statins, and Vioxx [rofecoxib]   Social History:  The patient  reports that she has never smoked. She has never used smokeless tobacco. She reports that she does not drink alcohol and does not use drugs.   Family History:   family history includes Heart disease in her sister.    Review of Systems: Review of Systems  Constitutional: Negative.   HENT: Negative.   Respiratory: Negative.   Cardiovascular: Negative.   Gastrointestinal: Negative.   Musculoskeletal: Negative.   Neurological: Negative.   Psychiatric/Behavioral: Negative.   All other systems reviewed and are negative.   PHYSICAL EXAM: VS:  There were no vitals taken  for this visit. , BMI There is no height or weight on file to calculate BMI. Constitutional:  oriented to person, place, and time. No distress.  HENT:  Head: Grossly normal Eyes:  no discharge. No scleral icterus.  Neck: No JVD, no carotid bruits  Cardiovascular: Regular rate and rhythm, no murmurs appreciated Pulmonary/Chest: Clear to auscultation bilaterally, no wheezes or rails Abdominal: Soft.  no distension.  no tenderness.  Musculoskeletal: Normal range of motion Neurological:  normal muscle tone. Coordination normal. No atrophy Skin: Skin warm and dry Psychiatric: normal affect, pleasant   Recent Labs: 01/23/2019: ALT 17; BUN 25; Creatinine, Ser 0.82; Hemoglobin 10.9; Platelets 191; Potassium 4.0; Sodium 140; TSH 3.440    Lipid Panel Lab Results  Component Value Date   CHOL 151 08/14/2014   HDL 42 08/14/2014   LDLCALC 67 08/14/2014   TRIG 209 (H) 08/14/2014      Wt Readings from Last 3 Encounters:  06/05/19 148 lb 2 oz (67.2 kg)  01/23/19 150  lb (68 kg)  12/20/18 144 lb (65.3 kg)       ASSESSMENT AND PLAN:  Near syncope No orthostasis symptoms, blood pressure stable No further work-up at this time  Coronary artery disease involving native coronary artery of native heart without angina pectoris Imdur previously held secondary to headaches Blood pressure stable, no underlying anginal symptoms  Mixed hyperlipidemia Continue Crestor 5 mg daily  Cholesterol running higher, was not taking Zetia, medication has been refilled  Essential hypertension Blood pressure is well controlled on today's visit. No changes made to the medications.  Falls Several prior falls,  None recently Discussed need to continue her walking program  Type 2 diabetes mellitus with other circulatory complication, without long-term current use of insulin (HCC)  hemoglobin A1c 6.3 We have encouraged continued exercise, careful diet management in an effort to lose weight.  Anemia chronic issue Monitored by primary care    Total encounter time more than 25 minutes  Greater than 50% was spent in counseling and coordination of care with the patient   Disposition:   F/U  12 months   No orders of the defined types were placed in this encounter.    Signed, Esmond Plants, M.D., Ph.D. 12/11/2019  [place], [place] \

## 2019-12-12 ENCOUNTER — Ambulatory Visit: Payer: Medicare Other | Admitting: Cardiovascular Disease

## 2019-12-13 ENCOUNTER — Encounter: Payer: Self-pay | Admitting: Cardiovascular Disease

## 2020-01-24 ENCOUNTER — Other Ambulatory Visit: Payer: Self-pay | Admitting: Cardiovascular Disease

## 2020-02-07 ENCOUNTER — Other Ambulatory Visit: Payer: Self-pay | Admitting: Internal Medicine

## 2020-02-07 DIAGNOSIS — C73 Malignant neoplasm of thyroid gland: Secondary | ICD-10-CM

## 2020-02-08 ENCOUNTER — Other Ambulatory Visit: Payer: Self-pay | Admitting: Cardiovascular Disease

## 2020-02-08 NOTE — Telephone Encounter (Signed)
Tried to reach pt regarding her Nitro refills, Pharmacy sent request for refills. Pt was given a refill for 25 pills 2 weeks ago, wanted to check in with pt to see if she has been having CP as to why she needs nitro refills so soon or if this is an automatic response for refills.   Pt's mobile number is not working correctly, home number unable to leave VM.  Called [facility] pharmacist to inquire about her Nitro refills. Pharmacist stated pt picked up her prescription on 01/27/2020, her Nitro is on an automatic refill, she will have that remove and only refill when pt needs them.

## 2020-03-04 NOTE — Progress Notes (Unsigned)
Cardiology Office Note  Date:  03/05/2020   ID:  [name], [place] [birth year], MRN [id number]  PCP:  Idelle Crouch, MD   Chief Complaint  Patient presents with  . Follow-up    6 Months follow up and c/o pain/pressure around her head even in the back of the skull. Medications verbally reviewed with patient.     HPI:  [name] is a 87 y/o female with PMH of  coronary artery disease,  cath May 2016 revealing severe LAD and RCA disease,  Percutaneous intervention and drug-eluting stent placement was performed to  the right coronary artery.The LAD was also felt to be amenable for PCI but it was felt that she would require a Rotablator. This was performed later at [facility] Jun 01, 2014 She presents today for routine follow-up for coronary artery disease  LOV 05/2019 Legs weaker on that visit Head itchy , thinks it is from shots to eyes  Active,  Uses a walker  She eats out every day,  drinks significant amount of water.  No falls No chest pain, no SOB  Stress at home, husband is getting weaker, no regular exercise program  Lab Results  Component Value Date   CHOL 151 08/14/2014   HDL 42 08/14/2014   LDLCALC 67 08/14/2014   TRIG 209 (H) 08/14/2014     EKG personally reviewed by myself on todays visit Shows normal sinus rhythm rate 70 bpm nonspecific ST abnormality, no significant change from prior EKG  Past medical history reviewed emergency room September 27, 2017 for chest pain Cardiac enzymes negative, other work-up benign  Carotid 2016 Findings consistent with 1-39 percent stenosis involving the right internal carotid artery and the left internal carotid artery.  Prior falls x 3 Mechanical, tripped on a mat, often stepping in the wrong place Very deconditioned, terrible gait instability, walks with a cane  Previous problems with gout, uric acid 8.1 On meloxican prn for pain  episode of garbled speech after PCI and MRI/A of the brain  was performed and showed an acute, small, nonhemorrhagic infarct of the left caudate with moderate small vessel disease. She did not have any acute neurologic deficits.  Carotid ultrasound was performed and showed bilateral 1-39% internal carotid artery stenosis.   Thyroid nodule found, status post ablation, currently on thyroid supplementation This was performed March 2016  PMH:   has a past medical history of CAD (coronary artery disease), Carotid artery disease ([place]), Chronic anxiety, Diverticulosis, Esophagitis, GERD (gastroesophageal reflux disease), History of colon polyps, History of echocardiogram, Hyperlipidemia, Hypertension, Hypothyroidism, Mild reactive airways disease, Osteoarthritis, PONV (postoperative nausea and vomiting), Rheumatoid arthritis ([place]), Stroke ([place]), Thyroid cancer ([place]), and Type 2 diabetes mellitus ([place]).  PSH:    Past Surgical History:  Procedure Laterality Date  . ABDOMINAL HYSTERECTOMY    . APPENDECTOMY    . AUGMENTATION MAMMAPLASTY    . BACK SURGERY    . BREAST IMPLANT REMOVAL  06/2001  . CARDIAC CATHETERIZATION N/A 06/06/2014   Procedure: Coronary/Graft Atherectomy;  Surgeon: Wellington Hampshire, MD;  Location: [place] [facility];  Service: Cardiovascular;  Laterality: N/A;  . CARDIAC CATHETERIZATION N/A 05/31/2014   Procedure: Left Heart Cath;  Surgeon: Minna Merritts, MD;  Location: [place] [facility];  Service: Cardiovascular;  Laterality: N/A;  . CARDIAC CATHETERIZATION N/A 05/31/2014   Procedure: Coronary Stent Intervention;  Surgeon: Wellington Hampshire, MD;  Location: [place] [facility];  Service: Cardiovascular;  Laterality: N/A;  . CARPAL TUNNEL  RELEASE     "I've have a total of 7" (06/06/2014)  . CATARACT EXTRACTION W/ INTRAOCULAR LENS  IMPLANT, BILATERAL Bilateral   . CHOLECYSTECTOMY    . CORONARY ANGIOPLASTY WITH STENT PLACEMENT  05/31/2014; 06/06/2014   "2; 1"  . GANGLION CYST EXCISION Right    AC joint  . LUMBAR LAMINECTOMY  X 6  . MENISCUS  REPAIR Bilateral    "2 on the right; 1 on the left"  . ROTATOR CUFF REPAIR     "I think 2 left, 2 right"  . THYROIDECTOMY    . TONSILLECTOMY    . TUBAL LIGATION      Current Outpatient Medications  Medication Sig Dispense Refill  . allopurinol (ZYLOPRIM) 100 MG tablet TAKE 1 TABLET(100 MG) BY MOUTH TWICE DAILY 60 tablet 3  . ALPRAZolam (XANAX) 0.25 MG tablet Take 0.25 mg by mouth at bedtime as needed for anxiety or sleep.     Marland Kitchen augmented betamethasone dipropionate (DIPROLENE-AF) 0.05 % ointment Apply topically.    Marland Kitchen azelastine (ASTELIN) 0.1 % nasal spray Place into the nose.    . Cholecalciferol 1000 UNITS tablet Take 1,000 Units by mouth daily.     . clopidogrel (PLAVIX) 75 MG tablet TAKE 1 TABLET BY MOUTH  DAILY 90 tablet 3  . ezetimibe (ZETIA) 10 MG tablet Take 1 tablet (10 mg total) by mouth daily. 90 tablet 3  . fluticasone (FLONASE) 50 MCG/ACT nasal spray Place into the nose.    . furosemide (LASIX) 20 MG tablet Take 1 tablet (20 mg total) by mouth daily as needed (As needed for leg swelling take potassium with this.). 90 tablet 3  . ketoconazole (NIZORAL) 2 % shampoo Apply topically.    Marland Kitchen levothyroxine (SYNTHROID) 88 MCG tablet TAKE 1 TABLET(88 MCG) BY MOUTH DAILY BEFORE BREAKFAST 90 tablet 0  . metFORMIN (GLUCOPHAGE) 500 MG tablet Take 500 mg by mouth 2 (two) times a day.    . metoprolol succinate (TOPROL XL) 50 MG 24 hr tablet Take 1 tablet (50 mg total) by mouth daily. 90 tablet 3  . Multiple Vitamins-Minerals (ICAPS AREDS 2 PO) Take by mouth.    . nitroGLYCERIN (NITROSTAT) 0.4 MG SL tablet PLACE 1 TABLET UNDER THE TONGUE EVERY 5 MINUTES AS NEEDED FOR CHEST PAIN 25 tablet 1  . potassium chloride (K-DUR) 10 MEQ tablet Take 1 tablet (10 mEq total) by mouth daily as needed (As needed with Furosemide.). 90 tablet 3  . ramipril (ALTACE) 10 MG capsule TAKE 1 CAPSULE(10 MG) BY MOUTH DAILY 30 capsule 3  . rosuvastatin (CRESTOR) 5 MG tablet Take 1 tablet (5 mg total) by mouth daily.  Please call office to schedule appointment for May. 90 tablet 1  . Thiamine HCl (VITAMIN B-1 PO) Take by mouth daily.    . Colchicine (MITIGARE) 0.6 MG CAPS Take 0.6 mg by mouth daily as needed. 30 capsule 4   No current facility-administered medications for this visit.    Allergies:   Ciprofloxacin, Codeine, Metronidazole, Other, Sulfa antibiotics, Neomycin, Neomycin-bacitracin zn-polymyx, Bacitracin-polymyxin b, Celecoxib, Petrolatum-zinc oxide, Statins, and Vioxx [rofecoxib]   Social History:  The patient  reports that she has never smoked. She has never used smokeless tobacco. She reports that she does not drink alcohol and does not use drugs.   Family History:   family history includes Heart disease in her sister.    Review of Systems: Review of Systems  Constitutional: Negative.   HENT: Negative.   Respiratory: Negative.   Cardiovascular: Negative.  Gastrointestinal: Negative.   Musculoskeletal: Negative.   Neurological: Negative.   Psychiatric/Behavioral: Negative.   All other systems reviewed and are negative.   PHYSICAL EXAM: VS:  BP 136/62 (BP Location: Left Arm, Patient Position: Sitting, Cuff Size: Normal)   Pulse 70   Ht 5\' 2"  (1.575 m)   Wt 131 lb (59.4 kg)   SpO2 98%   BMI 23.96 kg/m  , BMI Body mass index is 23.96 kg/m. Constitutional:  oriented to person, place, and time. No distress.  HENT:  Head: Grossly normal Eyes:  no discharge. No scleral icterus.  Neck: No JVD, no carotid bruits  Cardiovascular: Regular rate and rhythm, no murmurs appreciated Pulmonary/Chest: Clear to auscultation bilaterally, no wheezes or rails Abdominal: Soft.  no distension.  no tenderness.  Musculoskeletal: Normal range of motion Neurological:  normal muscle tone. Coordination normal. No atrophy Skin: Skin warm and dry Psychiatric: normal affect, pleasant   Recent Labs: No results found for requested labs within last 8760 hours.    Lipid Panel Lab Results   Component Value Date   CHOL 151 08/14/2014   HDL 42 08/14/2014   LDLCALC 67 08/14/2014   TRIG 209 (H) 08/14/2014      Wt Readings from Last 3 Encounters:  03/05/20 131 lb (59.4 kg)  06/05/19 148 lb 2 oz (67.2 kg)  01/23/19 150 lb (68 kg)       ASSESSMENT AND PLAN:  Near syncope Denies recent symptoms, blood pressure stable  Coronary artery disease involving native coronary artery of native heart without angina pectoris Imdur previously held secondary to headaches Currently with no symptoms of angina. No further workup at this time. Continue current medication regimen.  Mixed hyperlipidemia Recommend she continue Crestor Zetia Numbers at goal  Essential hypertension Blood pressure is well controlled on today's visit. No changes made to the medications.  Falls Several prior falls,  No recent falls, recommend she work on her leg strength Discussed need to continue her walking program  Type 2 diabetes mellitus with other circulatory complication, without long-term current use of insulin (HCC)  hemoglobin A1c 6.3 Weight actually appears to be down over the past year, monitoring her diet closely  Anemia chronic issue Monitored by primary care    Total encounter time more than 25 minutes  Greater than 50% was spent in counseling and coordination of care with the patient   Disposition:   F/U  12 months   Orders Placed This Encounter  Procedures  . EKG 12-Lead     Signed, Esmond Plants, M.D., Ph.D. 03/05/2020  [place], [place] \

## 2020-03-05 ENCOUNTER — Other Ambulatory Visit: Payer: Self-pay

## 2020-03-05 ENCOUNTER — Encounter: Payer: Self-pay | Admitting: Cardiovascular Disease

## 2020-03-05 ENCOUNTER — Ambulatory Visit (INDEPENDENT_AMBULATORY_CARE_PROVIDER_SITE_OTHER): Payer: Medicare Other | Admitting: Cardiovascular Disease

## 2020-03-05 VITALS — BP 136/62 | HR 70 | Ht 62.0 in | Wt 131.0 lb

## 2020-03-05 DIAGNOSIS — I739 Peripheral vascular disease, unspecified: Secondary | ICD-10-CM

## 2020-03-05 DIAGNOSIS — E1159 Type 2 diabetes mellitus with other circulatory complications: Secondary | ICD-10-CM

## 2020-03-05 DIAGNOSIS — I25118 Atherosclerotic heart disease of native coronary artery with other forms of angina pectoris: Secondary | ICD-10-CM | POA: Diagnosis not present

## 2020-03-05 DIAGNOSIS — I10 Essential (primary) hypertension: Secondary | ICD-10-CM

## 2020-03-05 DIAGNOSIS — I639 Cerebral infarction, unspecified: Secondary | ICD-10-CM

## 2020-03-05 DIAGNOSIS — I6523 Occlusion and stenosis of bilateral carotid arteries: Secondary | ICD-10-CM

## 2020-03-05 DIAGNOSIS — E782 Mixed hyperlipidemia: Secondary | ICD-10-CM

## 2020-03-05 MED ORDER — COLCHICINE 0.6 MG PO CAPS: 0.6000 mg | ORAL_CAPSULE | Freq: Every day | ORAL | 4 refills | Status: AC | PRN

## 2020-03-05 NOTE — Patient Instructions (Addendum)
Medication Instructions:  TRY OTC benadryl (lasts 6 hrs)and generic pepcid (twice a day) for head itching  If you need a refill on your cardiac medications before your next appointment, please call your pharmacy.    Lab work: No new labs needed   If you have labs (blood work) drawn today and your tests are completely normal, you will receive your results only by: Marland Kitchen MyChart Message (if you have MyChart) OR . A paper copy in the mail If you have any lab test that is abnormal or we need to change your treatment, we will call you to review the results.   Testing/Procedures: No new testing needed   Follow-Up: At [facility], you and your health needs are our priority.  As part of our continuing mission to provide you with exceptional heart care, we have created designated Provider Care Teams.  These Care Teams include your primary Cardiologist (physician) and Advanced Practice Providers (APPs -  Physician Assistants and Nurse Practitioners) who all work together to provide you with the care you need, when you need it.  . You will need a follow up appointment in 12 months  . Providers on your designated Care Team:   . Murray Hodgkins, NP . Christell Faith, PA-C . Marrianne Mood, PA-C  Any Other Special Instructions Will Be Listed Below (If Applicable).  COVID-19 Vaccine Information can be found at: ShippingScam.co.uk For questions related to vaccine distribution or appointments, please email [email or web address] or call [phone].

## 2020-03-06 ENCOUNTER — Encounter: Payer: Self-pay | Admitting: Physician Assistant

## 2020-03-11 ENCOUNTER — Other Ambulatory Visit: Payer: Medicare Other

## 2020-03-11 ENCOUNTER — Ambulatory Visit: Payer: Medicare Other | Admitting: Internal Medicine

## 2020-03-12 ENCOUNTER — Inpatient Hospital Stay: Payer: Medicare Other

## 2020-03-12 ENCOUNTER — Inpatient Hospital Stay: Payer: Medicare Other | Admitting: Internal Medicine

## 2020-03-19 ENCOUNTER — Other Ambulatory Visit: Payer: Self-pay | Admitting: Internal Medicine

## 2020-03-19 DIAGNOSIS — Z1231 Encounter for screening mammogram for malignant neoplasm of breast: Secondary | ICD-10-CM

## 2020-03-19 DIAGNOSIS — S0990XA Unspecified injury of head, initial encounter: Secondary | ICD-10-CM

## 2020-03-26 ENCOUNTER — Ambulatory Visit
Admission: RE | Admit: 2020-03-26 | Discharge: 2020-03-26 | Disposition: A | Discharge status: Home / Self Care | Payer: Medicare Other | Source: Ambulatory Visit | Attending: Internal Medicine | Admitting: Internal Medicine

## 2020-03-26 ENCOUNTER — Other Ambulatory Visit: Payer: Self-pay

## 2020-03-26 DIAGNOSIS — S0990XA Unspecified injury of head, initial encounter: Secondary | ICD-10-CM | POA: Diagnosis not present

## 2020-05-03 ENCOUNTER — Other Ambulatory Visit: Payer: Self-pay | Admitting: Internal Medicine

## 2020-05-03 DIAGNOSIS — C73 Malignant neoplasm of thyroid gland: Secondary | ICD-10-CM

## 2020-05-06 ENCOUNTER — Other Ambulatory Visit: Payer: Self-pay | Admitting: Cardiovascular Disease

## 2020-05-06 NOTE — Telephone Encounter (Signed)
Lmovm to verify if pt is taking Pantoprazole dose and instructions.

## 2020-05-22 ENCOUNTER — Other Ambulatory Visit: Payer: Self-pay | Admitting: Cardiovascular Disease

## 2020-05-27 ENCOUNTER — Other Ambulatory Visit: Payer: Self-pay | Admitting: Cardiovascular Disease

## 2020-06-05 ENCOUNTER — Other Ambulatory Visit: Payer: Self-pay | Admitting: Cardiovascular Disease

## 2020-06-06 NOTE — Telephone Encounter (Signed)
Lmovm to verify if pt is taking Pantoprazole 40 mg tablet. Medication not on pt's medication list.

## 2020-06-13 NOTE — Telephone Encounter (Signed)
Lmovm to contact office to verify if pt is taking Pantoprazole.

## 2020-06-17 ENCOUNTER — Emergency Department: Payer: Medicare Other

## 2020-06-17 ENCOUNTER — Emergency Department
Admission: EM | Admit: 2020-06-17 | Discharge: 2020-06-17 | Disposition: A | Discharge status: Home / Self Care | Payer: Medicare Other | Attending: Emergency Medicine | Admitting: Emergency Medicine

## 2020-06-17 DIAGNOSIS — E119 Type 2 diabetes mellitus without complications: Secondary | ICD-10-CM | POA: Diagnosis not present

## 2020-06-17 DIAGNOSIS — S0181XA Laceration without foreign body of other part of head, initial encounter: Secondary | ICD-10-CM | POA: Diagnosis not present

## 2020-06-17 DIAGNOSIS — S60052A Contusion of left little finger without damage to nail, initial encounter: Secondary | ICD-10-CM | POA: Diagnosis not present

## 2020-06-17 DIAGNOSIS — J45909 Unspecified asthma, uncomplicated: Secondary | ICD-10-CM | POA: Insufficient documentation

## 2020-06-17 DIAGNOSIS — S0990XA Unspecified injury of head, initial encounter: Secondary | ICD-10-CM

## 2020-06-17 DIAGNOSIS — Z7902 Long term (current) use of antithrombotics/antiplatelets: Secondary | ICD-10-CM | POA: Diagnosis not present

## 2020-06-17 DIAGNOSIS — Y92481 Parking lot as the place of occurrence of the external cause: Secondary | ICD-10-CM | POA: Diagnosis not present

## 2020-06-17 DIAGNOSIS — Z79899 Other long term (current) drug therapy: Secondary | ICD-10-CM | POA: Diagnosis not present

## 2020-06-17 DIAGNOSIS — E039 Hypothyroidism, unspecified: Secondary | ICD-10-CM | POA: Diagnosis not present

## 2020-06-17 DIAGNOSIS — Z7952 Long term (current) use of systemic steroids: Secondary | ICD-10-CM | POA: Diagnosis not present

## 2020-06-17 DIAGNOSIS — I25110 Atherosclerotic heart disease of native coronary artery with unstable angina pectoris: Secondary | ICD-10-CM | POA: Insufficient documentation

## 2020-06-17 DIAGNOSIS — Z7984 Long term (current) use of oral hypoglycemic drugs: Secondary | ICD-10-CM | POA: Insufficient documentation

## 2020-06-17 DIAGNOSIS — I10 Essential (primary) hypertension: Secondary | ICD-10-CM | POA: Diagnosis not present

## 2020-06-17 DIAGNOSIS — Z85850 Personal history of malignant neoplasm of thyroid: Secondary | ICD-10-CM | POA: Diagnosis not present

## 2020-06-17 DIAGNOSIS — W01198A Fall on same level from slipping, tripping and stumbling with subsequent striking against other object, initial encounter: Secondary | ICD-10-CM | POA: Diagnosis not present

## 2020-06-17 NOTE — ED Provider Notes (Signed)
[facility] Emergency Department Provider Note ____________________________________________   Event Date/Time   First MD Initiated Contact with Patient 06/17/20 1841     (approximate)  I have reviewed the triage vital signs and the nursing notes.   HISTORY  Chief Complaint Fall (Lac to forehead, fall, on plavix, no loc )    HPI [name] is a 87 y.o. female with PMH as noted below including CAD, stroke, hypertension, hyperlipidemia, and diabetes who presents after a fall from standing height, acute onset this afternoon.  The patient states that she had gotten out of her car at a store and was waiting for a card to be given to her when she believes that she tripped and fell.  She hit the front of her head and did not lose consciousness.  She does not remember feeling dizzy or lightheaded before she fell.  She also scraped her left small finger but denies other injuries.  She has some pain in her neck but not in her back.   Past Medical History:  Diagnosis Date  . CAD (coronary artery disease)    a. lexiscan 08/2013: nl wall motion, no ischemia, EF 50-55%; b. cardiac cath 05/31/2014: mLAD 90%, dLAD 50%, dLCx 60%, 1st Mrg 80%, pRCA 60% s/p PCI/DES, mRCA 1st lesion 70%, mRCA 2nd 95% s/p PCI/long DES to cover entire mRCA, RPLB 40%;  c. 05/2014 Staged PCI of LAD w/ 2.5 x 15 mm Xience Alpine DES.  . Carotid artery disease ([place])   . Chronic anxiety   . Diverticulosis   . Esophagitis   . GERD (gastroesophageal reflux disease)   . History of colon polyps   . History of echocardiogram    a. 08/2013: normal LVSF, nl RVSF, no valvular regurgitation or stenosis   . Hyperlipidemia   . Hypertension   . Hypothyroidism   . Mild reactive airways disease   . Osteoarthritis   . PONV (postoperative nausea and vomiting)   . Rheumatoid arthritis ([place])   . Stroke [facility])    a. 05/2014 pt c/o garbled speech following cath 5/19. MRI/A 5/26 showed left caudate  infarct->conservative mgmt per neuro.  . Thyroid cancer ([place])    a. s/p right sided hemi-thyroidectomy; b. planning for radioactive iodine tx; c. followed by Dr. Marcelene [place]  . Type 2 diabetes mellitus [facility])     Patient Active Problem List   Diagnosis Date Noted  . Achilles tendon contracture, bilateral 06/29/2017  . Acute bilateral low back pain without sciatica 03/17/2016  . Idiopathic chronic gout of multiple sites without tophus 03/17/2016  . Idiopathic gout of multiple sites 11/27/2015  . Cellulitis 05/15/2015  . Bilateral leg edema 02/08/2015  . Chronic anxiety 11/22/2014  . Cerebrovascular accident (CVA) ([place]) 07/25/2014  . Arterial vascular disease 07/25/2014  . Swelling of right lower extremity 06/22/2014  . Diabetes mellitus ([place])   . Stroke ([place]) 06/08/2014  . Anemia 06/07/2014  . Unstable angina ([place]) 06/06/2014  . Carotid artery disease ([place])   . Atherosclerosis of native coronary artery with stable angina pectoris ([place])   . Hyperlipidemia   . Essential hypertension   . Angina pectoris ([place]) 05/31/2014  . SOB (shortness of breath) 04/11/2014  . Palpitations 04/11/2014  . Other fatigue 04/11/2014  . Thyroid cancer ([place]) 04/11/2014    Past Surgical History:  Procedure Laterality Date  . ABDOMINAL HYSTERECTOMY    . APPENDECTOMY    . AUGMENTATION MAMMAPLASTY    . BACK SURGERY    . BREAST IMPLANT  REMOVAL  06/2001  . CARDIAC CATHETERIZATION N/A 06/06/2014   Procedure: Coronary/Graft Atherectomy;  Surgeon: Wellington Hampshire, MD;  Location: [place] CV LAB;  Service: Cardiovascular;  Laterality: N/A;  . CARDIAC CATHETERIZATION N/A 05/31/2014   Procedure: Left Heart Cath;  Surgeon: Minna Merritts, MD;  Location: [place] [facility];  Service: Cardiovascular;  Laterality: N/A;  . CARDIAC CATHETERIZATION N/A 05/31/2014   Procedure: Coronary Stent Intervention;  Surgeon: Wellington Hampshire, MD;  Location: [place] [facility];  Service: Cardiovascular;  Laterality: N/A;  .  CARPAL TUNNEL RELEASE     "I've have a total of 7" (06/06/2014)  . CATARACT EXTRACTION W/ INTRAOCULAR LENS  IMPLANT, BILATERAL Bilateral   . CHOLECYSTECTOMY    . CORONARY ANGIOPLASTY WITH STENT PLACEMENT  05/31/2014; 06/06/2014   "2; 1"  . GANGLION CYST EXCISION Right    AC joint  . LUMBAR LAMINECTOMY  X 6  . MENISCUS REPAIR Bilateral    "2 on the right; 1 on the left"  . ROTATOR CUFF REPAIR     "I think 2 left, 2 right"  . THYROIDECTOMY    . TONSILLECTOMY    . TUBAL LIGATION      Prior to Admission medications   Medication Sig Start Date End Date Taking? Authorizing Provider  allopurinol (ZYLOPRIM) 100 MG tablet TAKE 1 TABLET(100 MG) BY MOUTH TWICE DAILY 11/14/18   Newt Minion, MD  ALPRAZolam Duanne Moron) 0.25 MG tablet Take 0.25 mg by mouth at bedtime as needed for anxiety or sleep.  08/23/13   [provider]  augmented betamethasone dipropionate (DIPROLENE-AF) 0.05 % ointment Apply topically. 11/06/19   [provider]  azelastine (ASTELIN) 0.1 % nasal spray Place into the nose. 06/16/19   [provider]  Cholecalciferol 1000 UNITS tablet Take 1,000 Units by mouth daily.     [provider]  clopidogrel (PLAVIX) 75 MG tablet TAKE 1 TABLET BY MOUTH  DAILY 09/25/19   Minna Merritts, MD  Colchicine (MITIGARE) 0.6 MG CAPS Take 0.6 mg by mouth daily as needed. 03/05/20   Minna Merritts, MD  ezetimibe (ZETIA) 10 MG tablet Take 1 tablet (10 mg total) by mouth daily. 06/05/19   Minna Merritts, MD  fluticasone (FLONASE) 50 MCG/ACT nasal spray Place into the nose. 06/08/19   [provider]  furosemide (LASIX) 20 MG tablet Take 1 tablet (20 mg total) by mouth daily as needed (As needed for leg swelling take potassium with this.). 06/27/18 06/05/19  Minna Merritts, MD  ketoconazole (NIZORAL) 2 % shampoo Apply topically. 01/19/20   [provider]  levothyroxine (SYNTHROID) 88 MCG tablet TAKE 1 TABLET(88 MCG) BY MOUTH DAILY BEFORE BREAKFAST  05/03/20   Cammie Sickle, MD  metFORMIN (GLUCOPHAGE) 500 MG tablet Take 500 mg by mouth 2 (two) times a day. 05/10/18   [provider]  metoprolol succinate (TOPROL XL) 50 MG 24 hr tablet Take 1 tablet (50 mg total) by mouth daily. 05/13/15   Minna Merritts, MD  Multiple Vitamins-Minerals (ICAPS AREDS 2 PO) Take by mouth.    [provider]  nitroGLYCERIN (NITROSTAT) 0.4 MG SL tablet PLACE 1 TABLET UNDER THE TONGUE EVERY 5 MINUTES AS NEEDED FOR CHEST PAIN 02/08/20   Minna Merritts, MD  potassium chloride (K-DUR) 10 MEQ tablet Take 1 tablet (10 mEq total) by mouth daily as needed (As needed with Furosemide.). 06/27/18 06/05/19  Minna Merritts, MD  ramipril (ALTACE) 10 MG capsule TAKE 1  CAPSULE(10 MG) BY MOUTH DAILY 05/13/15   Minna Merritts, MD  rosuvastatin (CRESTOR) 5 MG tablet TAKE 1 TABLET(5 MG) BY MOUTH DAILY. MAY 05/22/20   Minna Merritts, MD  Thiamine HCl (VITAMIN B-1 PO) Take by mouth daily.    [provider]    Allergies Ciprofloxacin, Codeine, Metronidazole, Other, Sulfa antibiotics, Neomycin, Neomycin-bacitracin zn-polymyx, Bacitracin-polymyxin b, Celecoxib, Petrolatum-zinc oxide, Statins, and Vioxx [rofecoxib]  Family History  Problem Relation Age of Onset  . Heart disease Sister        stent placed x 3     Social History Social History   Tobacco Use  . Smoking status: Never Smoker  . Smokeless tobacco: Never Used  Substance Use Topics  . Alcohol use: No  . Drug use: No    Review of Systems  Constitutional: No fever. Eyes: No redness. ENT: Positive for neck pain. Cardiovascular: Denies chest pain. Respiratory: Denies shortness of breath. Gastrointestinal: No vomiting Genitourinary: Negative for flank pain. Musculoskeletal: Negative for back pain. Skin: Negative for rash. Neurological: Positive for headache.  Negative for weakness or numbness.   ____________________________________________   PHYSICAL EXAM:  VITAL  SIGNS: ED Triage Vitals [06/17/20 1830]  Enc Vitals Group     BP (!) 200/81     Pulse Rate 88     Resp 18     Temp 98.8 F (37.1 C)     Temp Source Oral     SpO2 98 %     Weight      Height      Head Circumference      Peak Flow      Pain Score 0     Pain Loc      Pain Edu?      Excl. in [place]?     Constitutional: Alert and oriented. Well appearing for age and in no acute distress. Eyes: Conjunctivae are normal.  EOMI.  PERRLA. Head: Approximately 2.5 cm laceration to left forehead with no galea exposed.  Some crushing maceration of the skin in the laceration. Nose: No congestion/rhinnorhea. Mouth/Throat: Mucous membranes are moist.   Neck: Normal range of motion.  No midline cervical spinal tenderness. Cardiovascular: Normal rate, regular rhythm.   Good peripheral circulation. Respiratory: Normal respiratory effort.  No retractions.  Gastrointestinal: No distention.  Musculoskeletal: No lower extremity edema.  Extremities warm and well perfused.  No midline spinal tenderness.  2 cm superficial abrasion to left fifth digit over PIP joint, with focal swelling.  Slightly limited range of motion at the PIP. Neurologic:  Normal speech and language.  Motor and sensory intact in all extremities.  Normal coordination with no ataxia.   Skin:  Skin is warm and dry. No rash noted. Psychiatric: Mood and affect are normal. Speech and behavior are normal.  ____________________________________________   LABS (all labs ordered are listed, but only abnormal results are displayed)  Labs Reviewed - No data to display ____________________________________________  EKG   ____________________________________________  RADIOLOGY  CT head: No ICH or other acute intracranial findings CT cervical spine: No acute fracture XR L hand: No acute fracture or foreign body  ____________________________________________   PROCEDURES  Procedure(s) performed: Yes   .Marland KitchenLaceration Repair  Date/Time:  06/17/2020 10:04 PM Performed by: Arta Silence, MD Authorized by: Arta Silence, MD   Consent:    Consent obtained:  Verbal   Consent given by:  Patient   Risks discussed:  Infection, pain, retained foreign body, poor cosmetic result and poor wound healing Universal protocol:  Patient identity confirmed:  Verbally with patient Anesthesia:    Anesthesia method:  Local infiltration   Local anesthetic:  Lidocaine 2% WITH epi Laceration details:    Location:  Face   Face location:  Forehead   Length (cm):  2.5   Depth (mm):  3 Exploration:    Hemostasis achieved with:  Direct pressure   Wound exploration: entire depth of wound visualized     Wound extent: no fascia violation noted     Contaminated: no   Treatment:    Area cleansed with:  Chlorhexidine and saline   Amount of cleaning:  Extensive   Irrigation solution:  Tap water   Irrigation method:  Syringe   Visualized foreign bodies/material removed: no   Skin repair:    Repair method:  Sutures   Suture size:  5-0   Suture material:  Nylon   Suture technique:  Simple interrupted   Number of sutures:  8 Approximation:    Approximation:  Close Repair type:    Repair type:  Simple Post-procedure details:    Dressing:  Sterile dressing   Procedure completion:  Tolerated well, no immediate complications    Critical Care performed: No ____________________________________________   INITIAL IMPRESSION / ASSESSMENT AND PLAN / ED COURSE  Pertinent labs & imaging results that were available during my care of the patient were reviewed by me and considered in my medical decision making (see chart for details).  87 year old female with PMH as noted above including CAD, stroke, hypertension, hyperlipidemia, and diabetes presents with a forehead laceration after an apparent mechanical fall from standing height.  The patient states that she believes she tripped and does not remember feeling dizzy or weak.  The patient  is on Plavix but no anticoagulation.  On exam, she is overall well-appearing.  Her vital signs are normal except for hypertension.  She has a somewhat irregular laceration to the left forehead, and abrasion to the left fifth digit, and a normal neurologic exam.  There is no midline spinal tenderness.  Overall presentation is consistent with injury sustained in a mechanical fall.  Based on the patient's description of the episode, there is no indication for medical work-up.  I did offer obtaining a lab work-up, however the patient states she feels confident that she just tripped and fell.  Therefore, we will obtain CT head and cervical spine, x-ray of the left hand, and repair the laceration.  ----------------------------------------- 10:03 PM on 06/17/2020 -----------------------------------------  CT head and cervical spine are negative for acute traumatic findings.  The x-ray of the hand is also negative.  The forehead laceration has been repaired  The patient states she continues to feel well and has had no dizziness or weakness in the ED.  She feels comfortable going home.  I counseled her on the results of the imaging.  Return precautions given, and she expresses understanding. ____________________________________________   FINAL CLINICAL IMPRESSION(S) / ED DIAGNOSES  Final diagnoses:  Laceration of forehead, initial encounter  Minor head injury, initial encounter  Contusion of left little finger without damage to nail, initial encounter      NEW MEDICATIONS STARTED DURING THIS VISIT:  New Prescriptions   No medications on file     Note:  This document was prepared using Dragon voice recognition software and may include unintentional dictation errors.    Arta Silence, MD 06/17/20 2206

## 2020-06-17 NOTE — Discharge Instructions (Addendum)
You may apply bacitracin or Neosporin ointment to the wound.  Keep it clean and dry.  Return in 5 to 7 days for suture removal.  Return to the ER immediately for new, worsening, or persistent severe headache, recurrent episodes of suddenly falling, any episodes of passing out, generalized weakness, dizziness, or any other new or worsening symptoms that concern you.

## 2020-06-17 NOTE — ED Notes (Signed)
[name] [phone]

## 2020-06-17 NOTE — ED Notes (Signed)
Follow up pcp info provided all questions answered  

## 2020-06-17 NOTE — ED Triage Notes (Signed)
Lac to forehead, fall, on plavix, no loc , tripped in [employer] parking lot

## 2020-07-22 ENCOUNTER — Other Ambulatory Visit: Payer: Self-pay | Admitting: Internal Medicine

## 2020-07-22 ENCOUNTER — Other Ambulatory Visit (HOSPITAL_COMMUNITY): Payer: Self-pay | Admitting: Internal Medicine

## 2020-07-22 DIAGNOSIS — R55 Syncope and collapse: Secondary | ICD-10-CM

## 2020-07-23 ENCOUNTER — Emergency Department: Payer: Medicare Other

## 2020-07-23 ENCOUNTER — Other Ambulatory Visit: Payer: Self-pay

## 2020-07-23 ENCOUNTER — Observation Stay: Payer: Medicare Other

## 2020-07-23 ENCOUNTER — Inpatient Hospital Stay
Admission: EM | Admit: 2020-07-23 | Discharge: 2020-07-25 | DRG: 392 | Disposition: A | Discharge status: Home Health | Payer: Medicare Other | Attending: Internal Medicine | Admitting: Internal Medicine

## 2020-07-23 DIAGNOSIS — I2510 Atherosclerotic heart disease of native coronary artery without angina pectoris: Secondary | ICD-10-CM | POA: Diagnosis present

## 2020-07-23 DIAGNOSIS — K5730 Diverticulosis of large intestine without perforation or abscess without bleeding: Secondary | ICD-10-CM

## 2020-07-23 DIAGNOSIS — M1000 Idiopathic gout, unspecified site: Secondary | ICD-10-CM | POA: Diagnosis present

## 2020-07-23 DIAGNOSIS — Z79890 Hormone replacement therapy (postmenopausal): Secondary | ICD-10-CM

## 2020-07-23 DIAGNOSIS — K5732 Diverticulitis of large intestine without perforation or abscess without bleeding: Secondary | ICD-10-CM | POA: Diagnosis not present

## 2020-07-23 DIAGNOSIS — D649 Anemia, unspecified: Secondary | ICD-10-CM | POA: Diagnosis not present

## 2020-07-23 DIAGNOSIS — R531 Weakness: Secondary | ICD-10-CM | POA: Diagnosis present

## 2020-07-23 DIAGNOSIS — Z885 Allergy status to narcotic agent status: Secondary | ICD-10-CM

## 2020-07-23 DIAGNOSIS — C73 Malignant neoplasm of thyroid gland: Secondary | ICD-10-CM | POA: Diagnosis present

## 2020-07-23 DIAGNOSIS — M1009 Idiopathic gout, multiple sites: Secondary | ICD-10-CM | POA: Diagnosis present

## 2020-07-23 DIAGNOSIS — Z8673 Personal history of transient ischemic attack (TIA), and cerebral infarction without residual deficits: Secondary | ICD-10-CM

## 2020-07-23 DIAGNOSIS — Z79899 Other long term (current) drug therapy: Secondary | ICD-10-CM

## 2020-07-23 DIAGNOSIS — Z7984 Long term (current) use of oral hypoglycemic drugs: Secondary | ICD-10-CM

## 2020-07-23 DIAGNOSIS — R112 Nausea with vomiting, unspecified: Secondary | ICD-10-CM | POA: Diagnosis not present

## 2020-07-23 DIAGNOSIS — I10 Essential (primary) hypertension: Secondary | ICD-10-CM | POA: Diagnosis present

## 2020-07-23 DIAGNOSIS — K219 Gastro-esophageal reflux disease without esophagitis: Secondary | ICD-10-CM | POA: Diagnosis present

## 2020-07-23 DIAGNOSIS — Z955 Presence of coronary angioplasty implant and graft: Secondary | ICD-10-CM

## 2020-07-23 DIAGNOSIS — Z882 Allergy status to sulfonamides status: Secondary | ICD-10-CM

## 2020-07-23 DIAGNOSIS — K5792 Diverticulitis of intestine, part unspecified, without perforation or abscess without bleeding: Secondary | ICD-10-CM | POA: Diagnosis present

## 2020-07-23 DIAGNOSIS — E1151 Type 2 diabetes mellitus with diabetic peripheral angiopathy without gangrene: Secondary | ICD-10-CM | POA: Diagnosis present

## 2020-07-23 DIAGNOSIS — Z886 Allergy status to analgesic agent status: Secondary | ICD-10-CM

## 2020-07-23 DIAGNOSIS — Z8249 Family history of ischemic heart disease and other diseases of the circulatory system: Secondary | ICD-10-CM

## 2020-07-23 DIAGNOSIS — F419 Anxiety disorder, unspecified: Secondary | ICD-10-CM | POA: Diagnosis present

## 2020-07-23 DIAGNOSIS — I639 Cerebral infarction, unspecified: Secondary | ICD-10-CM | POA: Diagnosis present

## 2020-07-23 DIAGNOSIS — E782 Mixed hyperlipidemia: Secondary | ICD-10-CM

## 2020-07-23 DIAGNOSIS — I7090 Unspecified atherosclerosis: Secondary | ICD-10-CM

## 2020-07-23 DIAGNOSIS — R410 Disorientation, unspecified: Secondary | ICD-10-CM | POA: Diagnosis not present

## 2020-07-23 DIAGNOSIS — E119 Type 2 diabetes mellitus without complications: Secondary | ICD-10-CM

## 2020-07-23 DIAGNOSIS — E890 Postprocedural hypothyroidism: Secondary | ICD-10-CM | POA: Diagnosis present

## 2020-07-23 DIAGNOSIS — Z20822 Contact with and (suspected) exposure to covid-19: Secondary | ICD-10-CM | POA: Diagnosis present

## 2020-07-23 DIAGNOSIS — I779 Disorder of arteries and arterioles, unspecified: Secondary | ICD-10-CM | POA: Diagnosis present

## 2020-07-23 DIAGNOSIS — E785 Hyperlipidemia, unspecified: Secondary | ICD-10-CM | POA: Diagnosis present

## 2020-07-23 DIAGNOSIS — E1159 Type 2 diabetes mellitus with other circulatory complications: Secondary | ICD-10-CM

## 2020-07-23 DIAGNOSIS — Z881 Allergy status to other antibiotic agents status: Secondary | ICD-10-CM

## 2020-07-23 DIAGNOSIS — I25118 Atherosclerotic heart disease of native coronary artery with other forms of angina pectoris: Secondary | ICD-10-CM | POA: Diagnosis not present

## 2020-07-23 DIAGNOSIS — Z85850 Personal history of malignant neoplasm of thyroid: Secondary | ICD-10-CM

## 2020-07-23 DIAGNOSIS — M069 Rheumatoid arthritis, unspecified: Secondary | ICD-10-CM | POA: Diagnosis present

## 2020-07-23 DIAGNOSIS — I6523 Occlusion and stenosis of bilateral carotid arteries: Secondary | ICD-10-CM

## 2020-07-23 DIAGNOSIS — Z7902 Long term (current) use of antithrombotics/antiplatelets: Secondary | ICD-10-CM

## 2020-07-23 DIAGNOSIS — Z888 Allergy status to other drugs, medicaments and biological substances status: Secondary | ICD-10-CM

## 2020-07-23 LAB — CBC WITH DIFFERENTIAL/PLATELET
Abs Immature Granulocytes: 0.05 10*3/uL (ref 0.00–0.07)
Basophils Absolute: 0 10*3/uL (ref 0.0–0.1)
Basophils Relative: 0 %
Eosinophils Absolute: 0 10*3/uL (ref 0.0–0.5)
Eosinophils Relative: 0 %
HCT: 35.9 % — ABNORMAL LOW (ref 36.0–46.0)
Hemoglobin: 11.9 g/dL — ABNORMAL LOW (ref 12.0–15.0)
Immature Granulocytes: 1 %
Lymphocytes Relative: 4 %
Lymphs Abs: 0.4 10*3/uL — ABNORMAL LOW (ref 0.7–4.0)
MCH: 30.4 pg (ref 26.0–34.0)
MCHC: 33.1 g/dL (ref 30.0–36.0)
MCV: 91.6 fL (ref 80.0–100.0)
Monocytes Absolute: 0.2 10*3/uL (ref 0.1–1.0)
Monocytes Relative: 2 %
Neutro Abs: 9.7 10*3/uL — ABNORMAL HIGH (ref 1.7–7.7)
Neutrophils Relative %: 93 %
Platelets: 224 10*3/uL (ref 150–400)
RBC: 3.92 MIL/uL (ref 3.87–5.11)
RDW: 14.9 % (ref 11.5–15.5)
WBC: 10.3 10*3/uL (ref 4.0–10.5)
nRBC: 0 % (ref 0.0–0.2)

## 2020-07-23 LAB — LACTIC ACID, PLASMA: Lactic Acid, Venous: 1.3 mmol/L (ref 0.5–1.9)

## 2020-07-23 LAB — TROPONIN I (HIGH SENSITIVITY)
Troponin I (High Sensitivity): 16 ng/L (ref <18)
Troponin I (High Sensitivity): 19 ng/L — ABNORMAL HIGH (ref <18)
Troponin I (High Sensitivity): 22 ng/L — ABNORMAL HIGH (ref <18)

## 2020-07-23 LAB — COMPREHENSIVE METABOLIC PANEL
ALT: 14 U/L (ref 0–44)
AST: 21 U/L (ref 15–41)
Albumin: 3.9 g/dL (ref 3.5–5.0)
Alkaline Phosphatase: 57 U/L (ref 38–126)
Anion gap: 9 (ref 5–15)
BUN: 22 mg/dL (ref 8–23)
CO2: 27 mmol/L (ref 22–32)
Calcium: 9.1 mg/dL (ref 8.9–10.3)
Chloride: 106 mmol/L (ref 98–111)
Creatinine, Ser: 0.7 mg/dL (ref 0.44–1.00)
GFR, Estimated: >60 mL/min (ref >60)
Glucose, Bld: 138 mg/dL — ABNORMAL HIGH (ref 70–99)
Potassium: 3.8 mmol/L (ref 3.5–5.1)
Sodium: 142 mmol/L (ref 135–145)
Total Bilirubin: 0.7 mg/dL (ref 0.3–1.2)
Total Protein: 6.3 g/dL — ABNORMAL LOW (ref 6.5–8.1)

## 2020-07-23 LAB — TSH: TSH: 1.457 u[IU]/mL (ref 0.350–4.500)

## 2020-07-23 LAB — URINALYSIS, COMPLETE (UACMP) WITH MICROSCOPIC
Bilirubin Urine: NEGATIVE
Glucose, UA: NEGATIVE mg/dL
Hgb urine dipstick: NEGATIVE
Ketones, ur: 20 mg/dL — AB
Leukocytes,Ua: NEGATIVE
Nitrite: NEGATIVE
Protein, ur: 100 mg/dL — AB
Specific Gravity, Urine: 1.034 — ABNORMAL HIGH (ref 1.005–1.030)
pH: 8 (ref 5.0–8.0)

## 2020-07-23 LAB — GLUCOSE, CAPILLARY: Glucose-Capillary: 135 mg/dL — ABNORMAL HIGH (ref 70–99)

## 2020-07-23 LAB — RESP PANEL BY RT-PCR (FLU A&B, COVID) ARPGX2
Influenza A by PCR: NEGATIVE
Influenza B by PCR: NEGATIVE
SARS Coronavirus 2 by RT PCR: NEGATIVE

## 2020-07-23 LAB — LIPASE, BLOOD: Lipase: 49 U/L (ref 11–51)

## 2020-07-23 MED ORDER — ALLOPURINOL 100 MG PO TABS
100.0000 mg | ORAL_TABLET | Freq: Two times a day (BID) | ORAL | Status: DC
  Administered 2020-07-24 – 2020-07-25 (×2): 100 mg via ORAL
  Filled 2020-07-23 (×2): qty 1

## 2020-07-23 MED ORDER — EZETIMIBE 10 MG PO TABS
10.0000 mg | ORAL_TABLET | Freq: Every day | ORAL | Status: DC
  Administered 2020-07-24 – 2020-07-25 (×2): 10 mg via ORAL
  Filled 2020-07-23 (×2): qty 1

## 2020-07-23 MED ORDER — SODIUM CHLORIDE 0.9 % IV SOLN: INTRAVENOUS | Status: AC

## 2020-07-23 MED ORDER — ACETAMINOPHEN 325 MG PO TABS
650.0000 mg | ORAL_TABLET | Freq: Four times a day (QID) | ORAL | Status: DC | PRN
  Administered 2020-07-24 – 2020-07-25 (×2): 650 mg via ORAL
  Filled 2020-07-23 (×2): qty 2

## 2020-07-23 MED ORDER — CLOPIDOGREL BISULFATE 75 MG PO TABS
75.0000 mg | ORAL_TABLET | Freq: Every day | ORAL | Status: DC
  Administered 2020-07-24 – 2020-07-25 (×2): 75 mg via ORAL
  Filled 2020-07-23 (×2): qty 1

## 2020-07-23 MED ORDER — LEVOTHYROXINE SODIUM 88 MCG PO TABS
88.0000 ug | ORAL_TABLET | Freq: Every day | ORAL | Status: DC
  Administered 2020-07-24 – 2020-07-25 (×2): 88 ug via ORAL
  Filled 2020-07-23 (×3): qty 1

## 2020-07-23 MED ORDER — ROSUVASTATIN CALCIUM 10 MG PO TABS
5.0000 mg | ORAL_TABLET | Freq: Every day | ORAL | Status: DC
  Administered 2020-07-24 – 2020-07-25 (×2): 5 mg via ORAL
  Filled 2020-07-23 (×2): qty 1

## 2020-07-23 MED ORDER — ACETAMINOPHEN 650 MG RE SUPP: 650.0000 mg | Freq: Four times a day (QID) | RECTAL | Status: DC | PRN

## 2020-07-23 MED ORDER — LABETALOL HCL 5 MG/ML IV SOLN
5.0000 mg | INTRAVENOUS | Status: DC | PRN
Start: 2020-07-23 — End: 2020-07-25

## 2020-07-23 MED ORDER — ONDANSETRON HCL 4 MG/2ML IJ SOLN
4.0000 mg | Freq: Once | INTRAMUSCULAR | Status: AC
  Administered 2020-07-23: 4 mg via INTRAVENOUS
  Filled 2020-07-23: qty 2

## 2020-07-23 MED ORDER — SODIUM CHLORIDE 0.9 % IV BOLUS
500.0000 mL | Freq: Once | INTRAVENOUS | Status: AC
  Administered 2020-07-23: 500 mL via INTRAVENOUS

## 2020-07-23 MED ORDER — INSULIN ASPART 100 UNIT/ML IJ SOLN
0.0000 [IU] | Freq: Three times a day (TID) | INTRAMUSCULAR | Status: DC
  Administered 2020-07-24: 18:00:00 2 [IU] via SUBCUTANEOUS
  Filled 2020-07-23: qty 1

## 2020-07-23 MED ORDER — POLYETHYLENE GLYCOL 3350 17 G PO PACK: 17.0000 g | PACK | Freq: Every day | ORAL | Status: DC | PRN

## 2020-07-23 MED ORDER — PIPERACILLIN-TAZOBACTAM 3.375 G IVPB
3.3750 g | Freq: Three times a day (TID) | INTRAVENOUS | Status: DC
  Administered 2020-07-23 – 2020-07-25 (×5): 3.375 g via INTRAVENOUS
  Filled 2020-07-23 (×5): qty 50

## 2020-07-23 MED ORDER — SODIUM CHLORIDE 0.9% FLUSH
3.0000 mL | Freq: Two times a day (BID) | INTRAVENOUS | Status: DC
  Administered 2020-07-23 – 2020-07-25 (×4): 3 mL via INTRAVENOUS

## 2020-07-23 MED ORDER — ENOXAPARIN SODIUM 40 MG/0.4ML IJ SOSY
40.0000 mg | PREFILLED_SYRINGE | INTRAMUSCULAR | Status: DC
  Administered 2020-07-23 – 2020-07-24 (×2): 40 mg via SUBCUTANEOUS
  Filled 2020-07-23 (×2): qty 0.4

## 2020-07-23 MED ORDER — IOHEXOL 300 MG/ML  SOLN
75.0000 mL | Freq: Once | INTRAMUSCULAR | Status: AC | PRN
  Administered 2020-07-23: 75 mL via INTRAVENOUS

## 2020-07-23 MED ORDER — METOPROLOL SUCCINATE ER 50 MG PO TB24
50.0000 mg | ORAL_TABLET | Freq: Every day | ORAL | Status: DC
  Administered 2020-07-24 – 2020-07-25 (×2): 50 mg via ORAL
  Filled 2020-07-23 (×2): qty 1

## 2020-07-23 MED ORDER — ONDANSETRON HCL 4 MG/2ML IJ SOLN
4.0000 mg | Freq: Four times a day (QID) | INTRAMUSCULAR | Status: DC | PRN
  Administered 2020-07-23 – 2020-07-25 (×3): 4 mg via INTRAVENOUS
  Filled 2020-07-23 (×3): qty 2

## 2020-07-23 MED ORDER — RAMIPRIL 10 MG PO CAPS
10.0000 mg | ORAL_CAPSULE | Freq: Every day | ORAL | Status: DC
  Administered 2020-07-24 – 2020-07-25 (×2): 10 mg via ORAL
  Filled 2020-07-23 (×2): qty 1

## 2020-07-23 MED ORDER — ASPIRIN EC 81 MG PO TBEC
81.0000 mg | DELAYED_RELEASE_TABLET | Freq: Every day | ORAL | Status: DC
  Administered 2020-07-24 – 2020-07-25 (×2): 81 mg via ORAL
  Filled 2020-07-23 (×2): qty 1

## 2020-07-23 MED ORDER — METOCLOPRAMIDE HCL 5 MG/ML IJ SOLN
10.0000 mg | Freq: Once | INTRAMUSCULAR | Status: AC
  Administered 2020-07-23: 10 mg via INTRAVENOUS
  Filled 2020-07-23: qty 2

## 2020-07-23 NOTE — ED Provider Notes (Signed)
[facility] Emergency Department Provider Note  ____________________________________________   Event Date/Time   First MD Initiated Contact with Patient 07/23/20 1301     (approximate)  I have reviewed the triage vital signs and the nursing notes.   HISTORY  Chief Complaint Weakness and Emesis    HPI [name] is a 87 y.o. female presents emergency department with vomiting that started this morning.  She is also complaining of weakness.  She denies chest pain or shortness of breath.  No one else at home is sick.  She denies any exposure to bad food.  She denies fever but states that she has had chills.  No dysuria.  Past Medical History:  Diagnosis Date   CAD (coronary artery disease)    a. lexiscan 08/2013: nl wall motion, no ischemia, EF 50-55%; b. cardiac cath 05/31/2014: mLAD 90%, dLAD 50%, dLCx 60%, 1st Mrg 80%, pRCA 60% s/p PCI/DES, mRCA 1st lesion 70%, mRCA 2nd 95% s/p PCI/long DES to cover entire mRCA, RPLB 40%;  c. 05/2014 Staged PCI of LAD w/ 2.5 x 15 mm Xience Alpine DES.   Carotid artery disease (HCC)    Chronic anxiety    Diverticulosis    Esophagitis    GERD (gastroesophageal reflux disease)    History of colon polyps    History of echocardiogram    a. 08/2013: normal LVSF, nl RVSF, no valvular regurgitation or stenosis    Hyperlipidemia    Hypertension    Hypothyroidism    Mild reactive airways disease    Osteoarthritis    PONV (postoperative nausea and vomiting)    Rheumatoid arthritis ([place])    Stroke ([place])    a. 05/2014 pt c/o garbled speech following cath 5/19. MRI/A 5/26 showed left caudate infarct->conservative mgmt per neuro.   Thyroid cancer ([place])    a. s/p right sided hemi-thyroidectomy; b. planning for radioactive iodine tx; c. followed by Dr. Marcelene Butte   Type 2 diabetes mellitus [facility] [place])     Patient Active Problem List   Diagnosis Date Noted   Intractable nausea and vomiting 07/23/2020   Generalized weakness  07/23/2020   Achilles tendon contracture, bilateral 06/29/2017   Acute bilateral low back pain without sciatica 03/17/2016   Idiopathic chronic gout of multiple sites without tophus 03/17/2016   Idiopathic gout of multiple sites 11/27/2015   Cellulitis 05/15/2015   Bilateral leg edema 02/08/2015   Chronic anxiety 11/22/2014   Cerebrovascular accident (CVA) ([place]) 07/25/2014   Arterial vascular disease 07/25/2014   Swelling of right lower extremity 06/22/2014   Diabetes mellitus ([place])    Stroke ([place]) 06/08/2014   Anemia 06/07/2014   Unstable angina ([place]) 06/06/2014   Carotid artery disease ([place])    Atherosclerosis of native coronary artery with stable angina pectoris ([place])    Hyperlipidemia    Essential hypertension    Angina pectoris ([place]) 05/31/2014   SOB (shortness of breath) 04/11/2014   Palpitations 04/11/2014   Other fatigue 04/11/2014   Thyroid cancer ([place]) 04/11/2014    Past Surgical History:  Procedure Laterality Date   ABDOMINAL HYSTERECTOMY     APPENDECTOMY     AUGMENTATION MAMMAPLASTY     BACK SURGERY     BREAST IMPLANT REMOVAL  06/2001   CARDIAC CATHETERIZATION N/A 06/06/2014   Procedure: Coronary/Graft Atherectomy;  Surgeon: Wellington Hampshire, MD;  Location: [facility];  Service: Cardiovascular;  Laterality: N/A;   CARDIAC CATHETERIZATION N/A 05/31/2014   Procedure: Left Heart Cath;  Surgeon: Kathlene November  Rockey Situ, MD;  Location: [facility];  Service: Cardiovascular;  Laterality: N/A;   CARDIAC CATHETERIZATION N/A 05/31/2014   Procedure: Coronary Stent Intervention;  Surgeon: Wellington Hampshire, MD;  Location: [facility];  Service: Cardiovascular;  Laterality: N/A;   CARPAL TUNNEL RELEASE     "I've have a total of 7" (06/06/2014)   CATARACT EXTRACTION W/ INTRAOCULAR LENS  IMPLANT, BILATERAL Bilateral    CHOLECYSTECTOMY     CORONARY ANGIOPLASTY WITH STENT PLACEMENT  05/31/2014; 06/06/2014   "2; 1"   GANGLION CYST EXCISION Right    AC joint    LUMBAR LAMINECTOMY  X 6   MENISCUS REPAIR Bilateral    "2 on the right; 1 on the left"   ROTATOR CUFF REPAIR     "I think 2 left, 2 right"   THYROIDECTOMY     TONSILLECTOMY     TUBAL LIGATION      Prior to Admission medications   Medication Sig Start Date End Date Taking? Authorizing Provider  acetaminophen (TYLENOL) 325 MG tablet Take 650 mg by mouth as needed.   Yes [provider]  allopurinol (ZYLOPRIM) 100 MG tablet TAKE 1 TABLET(100 MG) BY MOUTH TWICE DAILY 11/14/18  Yes Newt Minion, MD  baclofen (LIORESAL) 20 MG tablet Take 20 mg by mouth 2 (two) times daily.   Yes [provider]  clopidogrel (PLAVIX) 75 MG tablet TAKE 1 TABLET BY MOUTH  DAILY 09/25/19  Yes Gollan, Kathlene November, MD  Colchicine (MITIGARE) 0.6 MG CAPS Take 0.6 mg by mouth daily as needed. 03/05/20  Yes Minna Merritts, MD  levothyroxine (SYNTHROID) 88 MCG tablet TAKE 1 TABLET(88 MCG) BY MOUTH DAILY BEFORE BREAKFAST 05/03/20  Yes Cammie Sickle, MD  metFORMIN (GLUCOPHAGE) 500 MG tablet Take 500 mg by mouth 2 (two) times a day. 05/10/18  Yes [provider]  metoprolol succinate (TOPROL XL) 50 MG 24 hr tablet Take 1 tablet (50 mg total) by mouth daily. 05/13/15  Yes Gollan, Kathlene November, MD  Multiple Vitamins-Minerals (ICAPS AREDS 2 PO) Take by mouth.   Yes [provider]  nitroGLYCERIN (NITROSTAT) 0.4 MG SL tablet PLACE 1 TABLET UNDER THE TONGUE EVERY 5 MINUTES AS NEEDED FOR CHEST PAIN 02/08/20  Yes Gollan, Kathlene November, MD  rosuvastatin (CRESTOR) 5 MG tablet TAKE 1 TABLET(5 MG) BY MOUTH DAILY. MAY 05/22/20  Yes Gollan, Kathlene November, MD  ALPRAZolam Duanne Moron) 0.25 MG tablet Take 0.25 mg by mouth at bedtime as needed for anxiety or sleep.  Patient not taking: Reported on 07/23/2020 08/23/13   [provider]  augmented betamethasone dipropionate (DIPROLENE-AF) 0.05 % ointment Apply topically. Patient not taking: Reported on 07/23/2020 11/06/19   [provider]  azelastine  (ASTELIN) 0.1 % nasal spray Place into the nose. Patient not taking: Reported on 07/23/2020 06/16/19   [provider]  Cholecalciferol 1000 UNITS tablet Take 1,000 Units by mouth daily.  Patient not taking: Reported on 07/23/2020    [provider]  ezetimibe (ZETIA) 10 MG tablet Take 1 tablet (10 mg total) by mouth daily. Patient not taking: Reported on 07/23/2020 06/05/19   Minna Merritts, MD  fluticasone [facility]) 50 MCG/ACT nasal spray Place into the nose. Patient not taking: Reported on 07/23/2020 06/08/19   [provider]  furosemide (LASIX) 20 MG tablet Take 1 tablet (20 mg total) by mouth daily as needed (As needed for leg swelling take potassium with this.). 06/27/18 06/05/19  Minna Merritts, MD  ketoconazole (  NIZORAL) 2 % shampoo Apply topically. Patient not taking: No sig reported 01/19/20   [provider]  potassium chloride (K-DUR) 10 MEQ tablet Take 1 tablet (10 mEq total) by mouth daily as needed (As needed with Furosemide.). 06/27/18 06/05/19  Minna Merritts, MD  ramipril (ALTACE) 10 MG capsule TAKE 1 CAPSULE(10 MG) BY MOUTH DAILY Patient not taking: Reported on 07/23/2020 05/13/15   Minna Merritts, MD  Thiamine HCl (VITAMIN B-1 PO) Take by mouth daily. Patient not taking: Reported on 07/23/2020    [provider]    Allergies Ciprofloxacin, Codeine, Metronidazole, Other, Sulfa antibiotics, Neomycin, Neomycin-bacitracin zn-polymyx, Bacitracin-polymyxin b, Celecoxib, Petrolatum-zinc oxide, Statins, and Vioxx [rofecoxib]  Family History  Problem Relation Age of Onset   Heart disease Sister        stent placed x 3     Social History Social History   Tobacco Use   Smoking status: Never   Smokeless tobacco: Never  Substance Use Topics   Alcohol use: No   Drug use: No    Review of Systems  Constitutional: No fever/chills Eyes: No visual changes. ENT: No sore throat. Respiratory: Denies cough Cardiovascular: Denies chest  pain Gastrointestinal: Denies abdominal pain, positive vomiting Genitourinary: Negative for dysuria. Musculoskeletal: Negative for back pain. Skin: Negative for rash. Psychiatric: no mood changes,     ____________________________________________   PHYSICAL EXAM:  VITAL SIGNS: ED Triage Vitals [07/23/20 1312]  Enc Vitals Group     BP      Pulse      Resp      Temp      Temp src      SpO2      Weight      Height      Head Circumference      Peak Flow      Pain Score 0     Pain Loc      Pain Edu?      Excl. in [place]?     Constitutional: Alert and oriented. Well appearing and in no acute distress. Eyes: Conjunctivae are normal.  Head: Atraumatic. Nose: No congestion/rhinnorhea. Mouth/Throat: Mucous membranes are moist.   Neck:  supple no lymphadenopathy noted Cardiovascular: Normal rate, regular rhythm. Heart sounds are normal Respiratory: Normal respiratory effort.  No retractions, lungs c t a  Abd: soft nontender bs normal all 4 quad GU: deferred Musculoskeletal: FROM all extremities, warm and well perfused Neurologic:  Normal speech and language.  Skin:  Skin is warm, dry and intact. No rash noted. Psychiatric: Mood and affect are normal. Speech and behavior are normal.  ____________________________________________   LABS (all labs ordered are listed, but only abnormal results are displayed)  Labs Reviewed  COMPREHENSIVE METABOLIC PANEL - Abnormal; Notable for the following components:      Result Value   Glucose, Bld 138 (*)    Total Protein 6.3 (*)    All other components within normal limits  CBC WITH DIFFERENTIAL/PLATELET - Abnormal; Notable for the following components:   Hemoglobin 11.9 (*)    HCT 35.9 (*)    Neutro Abs 9.7 (*)    Lymphs Abs 0.4 (*)    All other components within normal limits  URINALYSIS, COMPLETE (UACMP) WITH MICROSCOPIC - Abnormal; Notable for the following components:   Color, Urine STRAW (*)    APPearance HAZY (*)    Specific  Gravity, Urine 1.034 (*)    Ketones, ur 20 (*)    Protein, ur 100 (*)  Bacteria, UA RARE (*)    All other components within normal limits  TROPONIN I (HIGH SENSITIVITY) - Abnormal; Notable for the following components:   Troponin I (High Sensitivity) 19 (*)    All other components within normal limits  RESP PANEL BY RT-PCR (FLU A&B, COVID) ARPGX2  LIPASE, BLOOD  LACTIC ACID, PLASMA  COMPREHENSIVE METABOLIC PANEL  CBC  TSH  TROPONIN I (HIGH SENSITIVITY)  TROPONIN I (HIGH SENSITIVITY)   ____________________________________________   ____________________________________________  RADIOLOGY  Xray abd/chest Ct abd/pelvis  ____________________________________________   PROCEDURES  Procedure(s) performed: No  Procedures    ____________________________________________   INITIAL IMPRESSION / ASSESSMENT AND PLAN / ED COURSE  Pertinent labs & imaging results that were available during my care of the patient were reviewed by me and considered in my medical decision making (see chart for details).   Patient is an 87 year old female presents with vomiting.  See HPI.  Physical exam shows patient per stable.  DDx: Gastroenteritis, covid, pyelonephritis, MI, sbo  Cbc is normal, metabolic panel is normal, resp panel is negative, lipase is normal, lactic acid is normal, troponin is mildly elevated at 19  Pt continues to feel nauseated and week after fluids and anitnausea medications  Consult to hospitalist, pt will be admitted for observation for intractable vomiting and weakness  Spoke with the patient's daughter.  She has no information stating that she was given a prescription for baclofen 20 mg by her regular doctor.  States she took 1 dose last night.  However her daughter states that yesterday she was unsteady on her feet and felt somewhat confused.  Dr. Doy Hutching had ordered an MRI as outpatient.  He has been concerned about her confusion secondary to a fall few weeks ago.   We will order the MRI of the brain here.  I did discuss this with the hospitalist also.    [name] was evaluated in Emergency Department on 07/23/2020 for the symptoms described in the history of present illness. She was evaluated in the context of the global COVID-19 pandemic, which necessitated consideration that the patient might be at risk for infection with the SARS-CoV-2 virus that causes COVID-19. Institutional protocols and algorithms that pertain to the evaluation of patients at risk for COVID-19 are in a state of rapid change based on information released by regulatory bodies including the CDC and federal and state organizations. These policies and algorithms were followed during the patient's care in the ED.    As part of my medical decision making, I reviewed the following data within the [place] History obtained from family, Nursing notes reviewed and incorporated, Labs reviewed , EKG interpreted see physician read, Old chart reviewed, Radiograph reviewed , Discussed with admitting physician , Evaluated by EM attending , Notes from prior ED visits, and [place] Controlled Substance Database  ____________________________________________   FINAL CLINICAL IMPRESSION(S) / ED DIAGNOSES  Final diagnoses:  Sigmoid diverticulosis  Intractable nausea and vomiting  Confusion      NEW MEDICATIONS STARTED DURING THIS VISIT:  New Prescriptions   No medications on file     Note:  This document was prepared using Dragon voice recognition software and may include unintentional dictation errors.    Versie Starks, PA-C 07/23/20 Karsten Fells, MD 07/25/20 [phone]

## 2020-07-23 NOTE — H&P (Addendum)
History and Physical   [name] IWL:[id number] DOB: 1933-04-09 DOA: 07/23/2020  PCP: Idelle Crouch, MD   Patient coming from: Home  Chief Complaint: Nausea, vomiting, weakness  HPI: [name] is a 87 y.o. female with medical history significant of anemia, CAD status post DES, carotid artery disease, PAD, hyperlipidemia, CVA, diabetes, gout, arthritis, hypothyroidism with history of thyroid cancer, diverticulosis, anxiety who presents with ongoing nausea vomiting and weakness. As above patient has been having about 1 day of nausea and vomiting starting yesterday evening.  This has been resolving and she is felt significant weakness at home.  She denies sick contacts nor having any unusual foods. Vomiting noted to be green at times by ED staff. She states that with her nausea and vomiting yesterday it "felt like I was going to die ". She reports chills.  She denies fevers, chest pain, shortness of breath, abdominal pain, constipation, diarrhea  EDP was able to speak with patients daughter who reported that patient had a fall about a month ago and saw her PCP yesterday due to this as well as some headaches and neck pain.  Patient was prescribed baclofen and took it for the first time last night but was already acting confused and unsteady before taking the medication.  She reports MRI was ordered outpatient which I have confirmed with chart review.  And MRI was ordered by EDP, I agree with this.  ED Course: Vital signs in the ED significant for blood pressure in the 174Y to 814G systolic.  Lab work-up showed CMP with glucose 138, protein 6.3.  CBC showed hemoglobin stable 11.9.  Lactic acid normal, lipase normal, troponin with mild increase from 16-19.  Respiratory panel for flu and COVID-negative.  Chest x-ray with no acute abnormality.  CT abdomen pelvis pending.  Patient received IV fluids, Zofran, Reglan without significant provement in her symptoms.  Review of  Systems: As per HPI otherwise all other systems reviewed and are negative.  Past Medical History:  Diagnosis Date   CAD (coronary artery disease)    a. lexiscan 08/2013: nl wall motion, no ischemia, EF 50-55%; b. cardiac cath 05/31/2014: mLAD 90%, dLAD 50%, dLCx 60%, 1st Mrg 80%, pRCA 60% s/p PCI/DES, mRCA 1st lesion 70%, mRCA 2nd 95% s/p PCI/long DES to cover entire mRCA, RPLB 40%;  c. 05/2014 Staged PCI of LAD w/ 2.5 x 15 mm Xience [place] DES.   Carotid artery disease (HCC)    Chronic anxiety    Diverticulosis    Esophagitis    GERD (gastroesophageal reflux disease)    History of colon polyps    History of echocardiogram    a. 08/2013: normal LVSF, nl RVSF, no valvular regurgitation or stenosis    Hyperlipidemia    Hypertension    Hypothyroidism    Mild reactive airways disease    Osteoarthritis    PONV (postoperative nausea and vomiting)    Rheumatoid arthritis ([place])    Stroke ([place])    a. 05/2014 pt c/o garbled speech following cath 5/19. MRI/A 5/26 showed left caudate infarct->conservative mgmt per neuro.   Thyroid cancer ([employer])    a. s/p right sided hemi-thyroidectomy; b. planning for radioactive iodine tx; c. followed by Dr. Marcelene [place]   Type 2 diabetes mellitus ([place])     Past Surgical History:  Procedure Laterality Date   ABDOMINAL HYSTERECTOMY     APPENDECTOMY     AUGMENTATION MAMMAPLASTY     BACK SURGERY     BREAST IMPLANT REMOVAL  06/2001   CARDIAC CATHETERIZATION N/A 06/06/2014   Procedure: Coronary/Graft Atherectomy;  Surgeon: Wellington Hampshire, MD;  Location: [facility];  Service: Cardiovascular;  Laterality: N/A;   CARDIAC CATHETERIZATION N/A 05/31/2014   Procedure: Left Heart Cath;  Surgeon: Minna Merritts, MD;  Location: [facility];  Service: Cardiovascular;  Laterality: N/A;   CARDIAC CATHETERIZATION N/A 05/31/2014   Procedure: Coronary Stent Intervention;  Surgeon: Wellington Hampshire, MD;  Location: [facility];  Service: Cardiovascular;   Laterality: N/A;   CARPAL TUNNEL RELEASE     "I've have a total of 7" (06/06/2014)   CATARACT EXTRACTION W/ INTRAOCULAR LENS  IMPLANT, BILATERAL Bilateral    CHOLECYSTECTOMY     CORONARY ANGIOPLASTY WITH STENT PLACEMENT  05/31/2014; 06/06/2014   "2; 1"   GANGLION CYST EXCISION Right    AC joint   LUMBAR LAMINECTOMY  X 6   MENISCUS REPAIR Bilateral    "2 on the right; 1 on the left"   ROTATOR CUFF REPAIR     "I think 2 left, 2 right"   THYROIDECTOMY     TONSILLECTOMY     TUBAL LIGATION      Social History  reports that she has never smoked. She has never used smokeless tobacco. She reports that she does not drink alcohol and does not use drugs.  Allergies  Allergen Reactions   Ciprofloxacin Diarrhea and Nausea And Vomiting   Codeine Diarrhea and Nausea And Vomiting   Metronidazole Diarrhea, Nausea And Vomiting and Nausea Only   Other Hives and Other (See Comments)    Distress   Sulfa Antibiotics Other (See Comments) and Hives    Distress Distress   Neomycin Itching   Neomycin-Bacitracin Zn-Polymyx Other (See Comments)    Reaction:  Unknown  Reaction:  Unknown    Bacitracin-Polymyxin B Rash   Celecoxib Itching, Rash and Other (See Comments)   Petrolatum-Zinc Oxide Hives   Statins Other (See Comments)    Reaction:  Muscle pain Reaction:  Muscle pain Reaction:  Muscle pain   Vioxx [Rofecoxib] Itching and Rash    Family History  Problem Relation Age of Onset   Heart disease Sister        stent placed x 3   Reviewed on Admission  Prior to Admission medications   Medication Sig Start Date End Date Taking? Authorizing Provider  allopurinol (ZYLOPRIM) 100 MG tablet TAKE 1 TABLET(100 MG) BY MOUTH TWICE DAILY 11/14/18   Newt Minion, MD  ALPRAZolam Duanne Moron) 0.25 MG tablet Take 0.25 mg by mouth at bedtime as needed for anxiety or sleep.  08/23/13   [provider]  augmented betamethasone dipropionate (DIPROLENE-AF) 0.05 % ointment Apply topically. 11/06/19    [provider]  azelastine (ASTELIN) 0.1 % nasal spray Place into the nose. 06/16/19   [provider]  Cholecalciferol 1000 UNITS tablet Take 1,000 Units by mouth daily.     [provider]  clopidogrel (PLAVIX) 75 MG tablet TAKE 1 TABLET BY MOUTH  DAILY 09/25/19   Minna Merritts, MD  Colchicine (MITIGARE) 0.6 MG CAPS Take 0.6 mg by mouth daily as needed. 03/05/20   Minna Merritts, MD  ezetimibe (ZETIA) 10 MG tablet Take 1 tablet (10 mg total) by mouth daily. 06/05/19   Minna Merritts, MD  fluticasone (FLONASE) 50 MCG/ACT nasal spray Place into the nose. 06/08/19   [provider]  furosemide (LASIX) 20 MG tablet Take 1 tablet (20 mg total) by  mouth daily as needed (As needed for leg swelling take potassium with this.). 06/27/18 06/05/19  Minna Merritts, MD  ketoconazole (NIZORAL) 2 % shampoo Apply topically. 01/19/20   [provider]  levothyroxine (SYNTHROID) 88 MCG tablet TAKE 1 TABLET(88 MCG) BY MOUTH DAILY BEFORE BREAKFAST 05/03/20   Cammie Sickle, MD  metFORMIN (GLUCOPHAGE) 500 MG tablet Take 500 mg by mouth 2 (two) times a day. 05/10/18   [provider]  metoprolol succinate (TOPROL XL) 50 MG 24 hr tablet Take 1 tablet (50 mg total) by mouth daily. 05/13/15   Minna Merritts, MD  Multiple Vitamins-Minerals (ICAPS AREDS 2 PO) Take by mouth.    [provider]  nitroGLYCERIN (NITROSTAT) 0.4 MG SL tablet PLACE 1 TABLET UNDER THE TONGUE EVERY 5 MINUTES AS NEEDED FOR CHEST PAIN 02/08/20   Minna Merritts, MD  potassium chloride (K-DUR) 10 MEQ tablet Take 1 tablet (10 mEq total) by mouth daily as needed (As needed with Furosemide.). 06/27/18 06/05/19  Minna Merritts, MD  ramipril (ALTACE) 10 MG capsule TAKE 1 CAPSULE(10 MG) BY MOUTH DAILY 05/13/15   Minna Merritts, MD  rosuvastatin (CRESTOR) 5 MG tablet TAKE 1 TABLET(5 MG) BY MOUTH DAILY. MAY 05/22/20   Minna Merritts, MD  Thiamine HCl (VITAMIN B-1 PO) Take by mouth  daily.    [provider]    Physical Exam: Vitals:   07/23/20 1430 07/23/20 1530 07/23/20 1815 07/23/20 1830  BP: (!) 137/52 (!) 183/73  (!) 200/73  Pulse: 62 68 78 73  Resp: 16 17 17 19   Temp:      TempSrc:      SpO2: 97% 99% 99% 100%  Weight:      Height:       Physical Exam Constitutional:      Comments: Thin elderly female in mild discomfort.  HENT:     Head: Normocephalic and atraumatic.     Mouth/Throat:     Mouth: Mucous membranes are moist.     Pharynx: Oropharynx is clear.  Eyes:     Extraocular Movements: Extraocular movements intact.     Pupils: Pupils are equal, round, and reactive to light.  Cardiovascular:     Rate and Rhythm: Normal rate and regular rhythm.     Pulses: Normal pulses.     Heart sounds: Normal heart sounds.  Pulmonary:     Effort: Pulmonary effort is normal. No respiratory distress.     Breath sounds: Normal breath sounds.  Abdominal:     General: Bowel sounds are normal. There is no distension.     Palpations: Abdomen is soft.     Tenderness: There is no abdominal tenderness.  Musculoskeletal:        General: No swelling or deformity.  Skin:    General: Skin is warm and dry.  Neurological:     General: No focal deficit present.     Mental Status: She is oriented to person, place, and time.     Comments: Feeling diffusely weak.  Is able to move all 4 extremities spontaneously.  No reports of focal deficit.   Labs on Admission: I have personally reviewed following labs and imaging studies  CBC: Recent Labs  Lab 07/23/20 1349  WBC 10.3  NEUTROABS 9.7*  HGB 11.9*  HCT 35.9*  MCV 91.6  PLT 202    Basic Metabolic Panel: Recent Labs  Lab 07/23/20 1349  NA 142  K 3.8  CL 106  CO2 27  GLUCOSE  138*  BUN 22  CREATININE 0.70  CALCIUM 9.1    GFR: Estimated Creatinine Clearance: 41.4 mL/min (by C-G formula based on SCr of 0.7 mg/dL).  Liver Function Tests: Recent Labs  Lab 07/23/20 1349  AST 21  ALT 14   ALKPHOS 57  BILITOT 0.7  PROT 6.3*  ALBUMIN 3.9    Urine analysis:    Component Value Date/Time   COLORURINE Yellow 10/31/2013 1407   COLORURINE YELLOW 09/20/2009 1206   APPEARANCEUR Hazy 10/31/2013 1407   LABSPEC 1.011 10/31/2013 1407   PHURINE 5.0 10/31/2013 1407   PHURINE 5.5 09/20/2009 1206   GLUCOSEU Negative 10/31/2013 1407   HGBUR Negative 10/31/2013 [place] 09/20/2009 1206   BILIRUBINUR Negative 10/31/2013 1407   KETONESUR Negative 10/31/2013 1407   KETONESUR NEGATIVE 09/20/2009 1206   PROTEINUR Negative 10/31/2013 1407   PROTEINUR NEGATIVE 09/20/2009 1206   UROBILINOGEN 0.2 09/20/2009 1206   NITRITE Negative 10/31/2013 1407   NITRITE NEGATIVE 09/20/2009 1206   LEUKOCYTESUR Trace 10/31/2013 1407    Radiological Exams on Admission: DG Abdomen Acute W/Chest  Result Date: 07/23/2020 CLINICAL DATA:  Vomiting EXAM: DG ABDOMEN ACUTE WITH 1 VIEW CHEST COMPARISON:  None FINDINGS: Nonobstructive bowel gas pattern. No organomegaly or free air. Prior cholecystectomy. Heart and mediastinal contours are within normal limits. No focal opacities or effusions. No acute bony abnormality. Postoperative changes in the lumbar spine. IMPRESSION: No acute findings. Electronically Signed   By: Rolm Baptise M.D.   On: 07/23/2020 14:11    EKG: Independently reviewed.  Sinus rhythm at 69 bpm.  Assessment/Plan Principal Problem:   Intractable nausea and vomiting Active Problems:   Thyroid cancer (HCC)   Carotid artery disease (HCC)   Atherosclerosis of native coronary artery with stable angina pectoris ([place])   Hyperlipidemia   Essential hypertension   Anemia   Diabetes mellitus ([place])   Cerebrovascular accident (CVA) ([place])   Arterial vascular disease   Chronic anxiety   Idiopathic gout of multiple sites   Generalized weakness  Intractable nausea and vomiting > Patient presenting with 1 day of intractable nausea vomiting with increasing weakness.  > Initial work-up  does not demonstrate significant AKI nor infectious process.  CT pending at the time of this note. > In the ED patient remained nauseous despite antiemetics and with significant weakness given her age and mild elevation of troponin observation was requested. - We will continue with supportive care including IV fluids and antiemetics - Follow-up CT abdomen pelvis  - N.p.o. for now Addendum > CT scan came back with wall thickening consistent with acute diverticulitis on diverticulosis. - Add Zosyn (as opposed to ceftriaxone and Flagyl due to history of significant diarrhea and nausea and vomiting with metronidazole) - Continue with Clear liquids and IVF - Consider GI consult while admitted for further evaluation based on radiology read and recs  Weakness Syncope Head and neck pain Confusion > Patient seen yesterday by PCP for head neck pain in the setting of recent syncopal event and fall. > Outpatient MRI has been planned and this has been ordered in ED to move up work-up considering on going and new symptoms.  Was oriented x3 when I saw her. - PT and OT eval and treat - Follow-up MR   Anemia > Hemoglobin stable at 11.9 in the ED -Continue to monitor CBC  CAD status post DES Carotid artery disease PAD Hyperlipidemia CVA > Multiple vascular diseases as above.  No chest pain nor shortness of breath.  Only mild elevation in troponin in the ED from 16-19. - Monitor on telemetry overnight - Get additional troponin to ensure peak  - Continue home aspirin, Plavix  - Continue metoprolol and ramipril  Hypertension > Intermittently severely hypertensive greater than 885 systolic in the ED.  No focal neurologic deficits so we will proceed with as needed labetalol. > Denies high blood pressure at home. - Continue home metoprolol, ramipril - As needed labetalol for BP greater than 027 systolic  Diabetes - SSI   Hypothyroidism History of thyroid cancer - Continue home Synthroid  DVT  prophylaxis: Lovenox  Code Status:   Full  Family Communication:  Husband updated by phone  Disposition Plan:   Patient is from:  Home  Anticipated DC to:  Pending evaluation  Anticipated DC date:  1 to 2 days  Anticipated DC barriers: None  Consults called:  None Admission status:  Observation, telemetry   Severity of Illness: The appropriate patient status for this patient is OBSERVATION. Observation status is judged to be reasonable and necessary in order to provide the required intensity of service to ensure the patient's safety. The patient's presenting symptoms, physical exam findings, and initial radiographic and laboratory data in the context of their medical condition is felt to place them at decreased risk for further clinical deterioration. Furthermore, it is anticipated that the patient will be medically stable for discharge from the hospital within 2 midnights of admission. The following factors support the patient status of observation.   " The patient's presenting symptoms include nausea, vomiting, weakness. " The physical exam findings include thin elderly female, with ongoing nausea. " The initial radiographic and laboratory data are Lab work-up showed CMP with glucose 138, protein 6.3.  CBC showed hemoglobin stable 11.9.  Lactic acid normal, lipase normal, troponin with mild increase from 16-19.  Respiratory panel for flu and COVID-negative.  Chest x-ray with no acute abnormality.  CT abdomen pelvis pending.   Marcelyn Bruins MD [facility]  How to contact the [facility] Attending or Consulting provider [place] or covering provider during after hours [place], for this patient?   Check the care team in [facility] and look for a) attending/consulting [facility] provider listed and b) the [facility] team listed Log into [email or web address] and use [place]'s universal password to access. If you do not have the password, please contact the hospital operator. Locate the [facility] provider you are looking  for under [facility] and page to a number that you can be directly reached. If you still have difficulty reaching the provider, please page the [facility] (Director on Call) for the Hospitalists listed on amion for assistance.  07/23/2020, 6:46 PM

## 2020-07-23 NOTE — ED Notes (Signed)
Attending at bedside.

## 2020-07-23 NOTE — ED Notes (Signed)
Pt sleeping. This nurse with other critical pt. Will draw labs and start IV once other pt stabilized.

## 2020-07-23 NOTE — ED Triage Notes (Signed)
Pt to ED for weakness and vomiting (green bile) that started this morning. Weakness also started this morning.  From home, [facility].  Uses cane, walker  Hx HTN, 4 cardiac stents, diabetic CBG was 165, T 97.5 oral, other VS nml, 12 lead unremarkable   Pt A&O times 3   No diarrhea. Disoriented to time ("1922").

## 2020-07-23 NOTE — ED Notes (Signed)
Primary contact is her daughter Lavada Mesi, phone # [phone]

## 2020-07-24 DIAGNOSIS — M069 Rheumatoid arthritis, unspecified: Secondary | ICD-10-CM | POA: Diagnosis present

## 2020-07-24 DIAGNOSIS — E890 Postprocedural hypothyroidism: Secondary | ICD-10-CM | POA: Diagnosis present

## 2020-07-24 DIAGNOSIS — Z881 Allergy status to other antibiotic agents status: Secondary | ICD-10-CM | POA: Diagnosis not present

## 2020-07-24 DIAGNOSIS — R531 Weakness: Secondary | ICD-10-CM | POA: Diagnosis present

## 2020-07-24 DIAGNOSIS — K219 Gastro-esophageal reflux disease without esophagitis: Secondary | ICD-10-CM | POA: Diagnosis present

## 2020-07-24 DIAGNOSIS — Z7902 Long term (current) use of antithrombotics/antiplatelets: Secondary | ICD-10-CM | POA: Diagnosis not present

## 2020-07-24 DIAGNOSIS — M1000 Idiopathic gout, unspecified site: Secondary | ICD-10-CM | POA: Diagnosis present

## 2020-07-24 DIAGNOSIS — K5732 Diverticulitis of large intestine without perforation or abscess without bleeding: Secondary | ICD-10-CM | POA: Diagnosis present

## 2020-07-24 DIAGNOSIS — I2510 Atherosclerotic heart disease of native coronary artery without angina pectoris: Secondary | ICD-10-CM | POA: Diagnosis present

## 2020-07-24 DIAGNOSIS — Z85850 Personal history of malignant neoplasm of thyroid: Secondary | ICD-10-CM | POA: Diagnosis not present

## 2020-07-24 DIAGNOSIS — Z20822 Contact with and (suspected) exposure to covid-19: Secondary | ICD-10-CM | POA: Diagnosis present

## 2020-07-24 DIAGNOSIS — Z8249 Family history of ischemic heart disease and other diseases of the circulatory system: Secondary | ICD-10-CM | POA: Diagnosis not present

## 2020-07-24 DIAGNOSIS — Z888 Allergy status to other drugs, medicaments and biological substances status: Secondary | ICD-10-CM | POA: Diagnosis not present

## 2020-07-24 DIAGNOSIS — Z886 Allergy status to analgesic agent status: Secondary | ICD-10-CM | POA: Diagnosis not present

## 2020-07-24 DIAGNOSIS — Z8673 Personal history of transient ischemic attack (TIA), and cerebral infarction without residual deficits: Secondary | ICD-10-CM | POA: Diagnosis not present

## 2020-07-24 DIAGNOSIS — K5792 Diverticulitis of intestine, part unspecified, without perforation or abscess without bleeding: Secondary | ICD-10-CM | POA: Diagnosis not present

## 2020-07-24 DIAGNOSIS — Z882 Allergy status to sulfonamides status: Secondary | ICD-10-CM | POA: Diagnosis not present

## 2020-07-24 DIAGNOSIS — F419 Anxiety disorder, unspecified: Secondary | ICD-10-CM | POA: Diagnosis present

## 2020-07-24 DIAGNOSIS — I10 Essential (primary) hypertension: Secondary | ICD-10-CM | POA: Diagnosis present

## 2020-07-24 DIAGNOSIS — Z955 Presence of coronary angioplasty implant and graft: Secondary | ICD-10-CM | POA: Diagnosis not present

## 2020-07-24 DIAGNOSIS — Z885 Allergy status to narcotic agent status: Secondary | ICD-10-CM | POA: Diagnosis not present

## 2020-07-24 DIAGNOSIS — Z79899 Other long term (current) drug therapy: Secondary | ICD-10-CM | POA: Diagnosis not present

## 2020-07-24 DIAGNOSIS — E1159 Type 2 diabetes mellitus with other circulatory complications: Secondary | ICD-10-CM | POA: Diagnosis not present

## 2020-07-24 DIAGNOSIS — R410 Disorientation, unspecified: Secondary | ICD-10-CM | POA: Diagnosis present

## 2020-07-24 DIAGNOSIS — D649 Anemia, unspecified: Secondary | ICD-10-CM | POA: Diagnosis present

## 2020-07-24 DIAGNOSIS — E1151 Type 2 diabetes mellitus with diabetic peripheral angiopathy without gangrene: Secondary | ICD-10-CM | POA: Diagnosis present

## 2020-07-24 DIAGNOSIS — E785 Hyperlipidemia, unspecified: Secondary | ICD-10-CM | POA: Diagnosis present

## 2020-07-24 LAB — GLUCOSE, CAPILLARY
Glucose-Capillary: 101 mg/dL — ABNORMAL HIGH (ref 70–99)
Glucose-Capillary: 129 mg/dL — ABNORMAL HIGH (ref 70–99)
Glucose-Capillary: 196 mg/dL — ABNORMAL HIGH (ref 70–99)
Glucose-Capillary: 150 mg/dL — ABNORMAL HIGH (ref 70–99)

## 2020-07-24 LAB — COMPREHENSIVE METABOLIC PANEL
ALT: 12 U/L (ref 0–44)
AST: 19 U/L (ref 15–41)
Albumin: 3.3 g/dL — ABNORMAL LOW (ref 3.5–5.0)
Alkaline Phosphatase: 53 U/L (ref 38–126)
Anion gap: 10 (ref 5–15)
BUN: 17 mg/dL (ref 8–23)
CO2: 24 mmol/L (ref 22–32)
Calcium: 8.5 mg/dL — ABNORMAL LOW (ref 8.9–10.3)
Chloride: 108 mmol/L (ref 98–111)
Creatinine, Ser: 0.86 mg/dL (ref 0.44–1.00)
GFR, Estimated: >60 mL/min (ref >60)
Glucose, Bld: 108 mg/dL — ABNORMAL HIGH (ref 70–99)
Potassium: 3.7 mmol/L (ref 3.5–5.1)
Sodium: 142 mmol/L (ref 135–145)
Total Bilirubin: 0.8 mg/dL (ref 0.3–1.2)
Total Protein: 5.7 g/dL — ABNORMAL LOW (ref 6.5–8.1)

## 2020-07-24 LAB — CBC
HCT: 34.6 % — ABNORMAL LOW (ref 36.0–46.0)
Hemoglobin: 11.3 g/dL — ABNORMAL LOW (ref 12.0–15.0)
MCH: 30.1 pg (ref 26.0–34.0)
MCHC: 32.7 g/dL (ref 30.0–36.0)
MCV: 92 fL (ref 80.0–100.0)
Platelets: 185 10*3/uL (ref 150–400)
RBC: 3.76 MIL/uL — ABNORMAL LOW (ref 3.87–5.11)
RDW: 15 % (ref 11.5–15.5)
WBC: 9 10*3/uL (ref 4.0–10.5)
nRBC: 0 % (ref 0.0–0.2)

## 2020-07-24 LAB — HEMOGLOBIN A1C
Hgb A1c MFr Bld: 6.1 % — ABNORMAL HIGH (ref 4.8–5.6)
Mean Plasma Glucose: 128.37 mg/dL

## 2020-07-24 MED ORDER — SODIUM CHLORIDE 0.9 % IV SOLN: INTRAVENOUS | Status: DC | PRN

## 2020-07-24 MED ORDER — BOOST / RESOURCE BREEZE PO LIQD CUSTOM
1.0000 | Freq: Three times a day (TID) | ORAL | Status: DC
  Administered 2020-07-24 – 2020-07-25 (×2): 1 via ORAL

## 2020-07-24 NOTE — TOC Initial Note (Signed)
Transition of Care [facility]) - Initial/Assessment Note    Patient Details  Name: [name] MRN: [id number] Date of Birth: [birth year]  Transition of [facility]) CM/SW Contact:    Shelbie Hutching, RN Phone Number: 07/24/2020, 10:23 AM  Clinical Narrative:                 Patient placed under observation for intractable nausea and vomiting.  RNCM met with patient and husband at the bedside.  MOON reviewed and copy given to patient.  Patient is from home with her husband and is very independent.  She has a rollator and cane and she uses both of them.  Patient's eyesight is poor so her husband provides transportation.  Patient hopes to go home today.    Expected Discharge Plan: Home/Self Care Barriers to Discharge: Continued Medical Work up   Patient Goals and CMS Choice Patient states their goals for this hospitalization and ongoing recovery are:: patient hopes to go home today      Expected Discharge Plan and Services Expected Discharge Plan: Home/Self Care       Living arrangements for the past 2 months: Single Family Home (condo)                 DME Arranged: N/A DME Agency: NA       HH Arranged: NA          Prior Living Arrangements/Services Living arrangements for the past 2 months: Single Family Home (condo) Lives with:: Spouse Patient language and need for interpreter reviewed:: Yes Do you feel safe going back to the place where you live?: Yes      Need for Family Participation in Patient Care: Yes (Comment) Care giver support system in place?: Yes (comment) (husband) Current home services: DME (rollator and cane- elevated toilet seat) Criminal Activity/Legal Involvement Pertinent to Current Situation/Hospitalization: No - Comment as needed  Activities of Daily Living Home Assistive Devices/Equipment: Cane (specify quad or straight), Wheelchair ADL Screening (condition at time of admission) Patient's cognitive ability adequate to safely complete daily  activities?: Yes Is the patient deaf or have difficulty hearing?: No Does the patient have difficulty seeing, even when wearing glasses/contacts?: No Does the patient have difficulty concentrating, remembering, or making decisions?: No Patient able to express need for assistance with ADLs?: Yes Does the patient have difficulty dressing or bathing?: No Independently performs ADLs?: Yes (appropriate for developmental age) Does the patient have difficulty walking or climbing stairs?: Yes Weakness of Legs: Both Weakness of Arms/Hands: Both ("I have arthritis")  Permission Sought/Granted Permission sought to share information with : Case Manager, Family Supports Permission granted to share information with : Yes, Verbal Permission Granted  Share Information with NAME: Percell Miller     Permission granted to share info w Relationship: husband     Emotional Assessment Appearance:: Appears stated age Attitude/Demeanor/Rapport: Engaged Affect (typically observed): Accepting Orientation: : Oriented to Self, Oriented to Place, Oriented to  Time, Oriented to Situation Alcohol / Substance Use: Not Applicable Psych Involvement: No (comment)  Admission diagnosis:  Confusion [R41.0] Sigmoid diverticulosis [K57.30] Intractable nausea and vomiting [R11.2] Patient Active Problem List   Diagnosis Date Noted   Intractable nausea and vomiting 07/23/2020   Generalized weakness 07/23/2020   Achilles tendon contracture, bilateral 06/29/2017   Acute bilateral low back pain without sciatica 03/17/2016   Idiopathic chronic gout of multiple sites without tophus 03/17/2016   Idiopathic gout of multiple sites 11/27/2015   Cellulitis 05/15/2015   Bilateral leg edema 02/08/2015  Chronic anxiety 11/22/2014   Cerebrovascular accident (CVA) ([place]) 07/25/2014   Arterial vascular disease 07/25/2014   Swelling of right lower extremity 06/22/2014   Diabetes mellitus ([place])    Stroke ([place]) 06/08/2014   Anemia  06/07/2014   Unstable angina ([place]) 06/06/2014   Carotid artery disease [facility])    Atherosclerosis of native coronary artery with stable angina pectoris ([place])    Hyperlipidemia    Essential hypertension    Angina pectoris ([place]) 05/31/2014   SOB (shortness of breath) 04/11/2014   Palpitations 04/11/2014   Other fatigue 04/11/2014   Thyroid cancer ([place]) 04/11/2014   PCP:  Idelle Crouch, MD Pharmacy:   [id number] [address], [place] AT [address] [place] [address] Phone: [phone] Fax: [phone]  [facility] - [address], [place] [address] Phone: [phone] Fax: [phone]  [facility] [place], [place] [address] [place] [address] Phone: [phone] Fax: [phone]  [id number] - [address], [place] [address] Phone: [phone] Fax: [phone]  [facility]  ([place], [place] [address] Phone: [phone] Fax: [phone]     Social Determinants of Health (SDOH) Interventions    Readmission Risk Interventions No flowsheet data found.

## 2020-07-24 NOTE — Progress Notes (Signed)
  Chaplain On-Call responded to Order Requisition for Advance Directives information for the patient.  Chaplain met the patient and her husband at bedside. Provided the [place] and Living Will documents to the patient. Provided education regarding the purpose of the documents and the process for completion if the patient chooses to do that.  The patient stated her understanding, and they both stated that they will discuss the documents further with their daughter when she arrives this afternoon after her work.  Chaplain advised them that the Nurse can contact the [facility] when they are ready for document completion.  Chaplain Pollyann Samples M.Div., [facility]

## 2020-07-24 NOTE — Evaluation (Signed)
Occupational Therapy Evaluation Patient Details Name: [name] MRN: [id number] DOB: 1933-05-28 Today's Date: 07/24/2020    History of Present Illness [name] is an 3yoF who comes to [facility] 7/12 with ongoing N/V. Pt sustained a fall about 1 month prior, note also indicate recent LOC. PMH: anemia, CAD s/p DES, CAD, PAD, HLD, CVA, DM2, gout, hypoTSH, thyroid CA, diverticolusis, GAD. Brain MRI done this admission, no acute findings.   Clinical Impression   Pt seen for OT eval this date in setting of acute hospitalization d/t N/V. Pt reports that at baseline, she is able to walk around her one story condo with no AD, but has rollator she uses occasionally. States her spouse drives her to appts, but has recently gotten out of the hospital and even recently fallen at home himself. Pt presents this date with general weakness, deconditioning and decreased fxl activity tolerance. Pt requires MIN A/CGA for bed mobility, MIN A for ADL transfers and MIN/MOD A for seated LB ADLs on OT assessment this date. Pt currently presenting below her functional baseline of INDEP to MOD I. D/t pt's current weakness and decreased ability of her spouse to physically support her, anticipate she will require STR in SNF setting to improve strength to appropriate level for safety in the home environment.     Follow Up Recommendations  SNF    Equipment Recommendations  Tub/shower seat    Recommendations for Other Services       Precautions / Restrictions Precautions Precautions: Fall Restrictions Weight Bearing Restrictions: No      Mobility Bed Mobility Overal bed mobility: Needs Assistance Bed Mobility: Supine to Sit;Sit to Supine     Supine to sit: Min assist;Min guard Sit to supine: Min assist;Min guard   General bed mobility comments: deferred, pt nauseated and recently worse nausea with sitting up during OT    Transfers Overall transfer level: Needs assistance Equipment used:  Rolling walker (2 wheeled) Transfers: Sit to/from Stand Sit to Stand: Min assist         General transfer comment: MIN A just for lift off and then pt able to come to stand, requires cues for use of 2WW as she has rollator at home    Balance Overall balance assessment: Needs assistance Sitting-balance support: Feet supported Sitting balance-Leahy Scale: Good     Standing balance support: Bilateral upper extremity supported;Single extremity supported Standing balance-Leahy Scale: Fair Standing balance comment: able to alternate hands from RW to participate in standing ADLs, but low tolerance for satnding this date 2/2 nausea.                           ADL either performed or assessed with clinical judgement   ADL Overall ADL's : Needs assistance/impaired                                       General ADL Comments: SETUP for seated UB ADLs such as oral care, requires MIN/MOD A for LB dressing such as donning socks.     Vision Patient Visual Report: No change from baseline       Perception     Praxis      Pertinent Vitals/Pain Pain Assessment: 0-10 Pain Score: 5  Pain Location: neck pain since (07/17/20); bilat upper cervical spine Pain Descriptors / Indicators: Sore Pain Intervention(s): Limited activity within patient's tolerance;Monitored during session  Hand Dominance Right   Extremity/Trunk Assessment Upper Extremity Assessment Upper Extremity Assessment: Overall WFL for tasks assessed (grossly 4-/5, can tolerate challenge, but breaks with continued resistance)   Lower Extremity Assessment Lower Extremity Assessment: Overall WFL for tasks assessed (decreased knee flexion and hip rotation at baseline d/t h/o several knee and low back sxs. Limiting pt's toelrance for LB dressing tasks.)       Communication Communication Communication: No difficulties   Cognition Arousal/Alertness: Awake/alert Behavior During Therapy: WFL for tasks  assessed/performed Overall Cognitive Status: Within Functional Limits for tasks assessed                                     General Comments       Exercises Other Exercises Other Exercises: OT engages pt in ed re: role of OT in acute setting, importance of OOB activity, potential d/c f/u therapy needs. Pt and spouse with good understanding.   Shoulder Instructions      Home Living Family/patient expects to be discharged to:: Private residence Living Arrangements: Spouse/significant other Available Help at Discharge: Family Type of Home: House (condo) Home Access: Level entry     Home Layout: One level               Home Equipment: Environmental consultant - 2 wheels;Walker - 4 wheels;Bedside commode;Shower seat          Prior Functioning/Environment Level of Independence: Independent        Comments: Pt is out in community seevral times daily, SPC in home, 4WW in community. Fell 1x past 6 months in community.        OT Problem List: Decreased strength;Decreased activity tolerance;Impaired balance (sitting and/or standing)      OT Treatment/Interventions: Self-care/ADL training;Therapeutic activities;Therapeutic exercise;Patient/family education    OT Goals(Current goals can be found in the care plan section) Acute Rehab OT Goals Patient Stated Goal: to get stronger and be able to go home after hospital OT Goal Formulation: With patient/family Time For Goal Achievement: 08/07/20 Potential to Achieve Goals: Good ADL Goals Pt Will Transfer to Toilet: with supervision;ambulating (with LRAD to/from restroom) Pt Will Perform Toileting - Clothing Manipulation and hygiene: with supervision;sit to/from stand Pt/caregiver will Perform Home Exercise Program: Increased strength;Both right and left upper extremity Additional ADL Goal #1: Pt will tolerate 6 min standing tasks with LRAD to complete g/h versus theract to increase fxl standing tolerance.  OT Frequency: Min  1X/week   Barriers to D/C:            Co-evaluation              AM-PAC OT "6 Clicks" Daily Activity     Outcome Measure Help from another person eating meals?: None Help from another person taking care of personal grooming?: A Little Help from another person toileting, which includes using toliet, bedpan, or urinal?: A Little Help from another person bathing (including washing, rinsing, drying)?: A Little Help from another person to put on and taking off regular upper body clothing?: A Little Help from another person to put on and taking off regular lower body clothing?: A Little 6 Click Score: 19   End of Session Equipment Utilized During Treatment: Gait belt;Rolling walker Nurse Communication: Mobility status  Activity Tolerance: Patient tolerated treatment well Patient left: with call bell/phone within reach;with bed alarm set;in bed;with family/visitor present  OT Visit Diagnosis: Unsteadiness on feet (R26.81);Muscle weakness (generalized) (M62.81);History  of falling (Z91.81)                Time: 5686-1683 OT Time Calculation (min): 79 min Charges:  OT General Charges $OT Visit: 1 Visit OT Evaluation $OT Eval Moderate Complexity: 1 Mod OT Treatments $Self Care/Home Management : 38-52 mins $Therapeutic Activity: 23-37 mins  Gerrianne Scale, MS, OTR/L ascom [phone] 07/24/20, 5:50 PM

## 2020-07-24 NOTE — Progress Notes (Signed)
Initial Nutrition Assessment  DOCUMENTATION CODES:  Not applicable  INTERVENTION:  Advance as able per pt tolerance Boost Breeze po TID, each supplement provides 250 kcal and 9 grams of protein  NUTRITION DIAGNOSIS:  Inadequate oral intake related to nausea, vomiting as evidenced by meal completion < 50%, per patient/family report.  GOAL:  Patient will meet greater than or equal to 90% of their needs  MONITOR:  PO intake, Diet advancement, I & O's  REASON FOR ASSESSMENT:  Malnutrition Screening Tool    ASSESSMENT:  87 y.o. female presented to ED with vomiting and weakness that started the morning of presentation. PMH relevant for CAD, PAD, HLD, CVA, DM, gout, hx of thyroid cancer, diverticulosis, and GERD  Pt resting in bed at the time of visit. Appears fatigued and weak.  Pt reports she is having nausea and thinks she has vomited today but is not sure. Also reports she has not been able to eat anything but has tolerated some liquids. Reports that appetite is generally very good at home, eats three meals each day. Has only been eating poorly since her fall (noted to be a month ago in chart, but pt reports it was the day of admission).   Agreeable to receive boost breeze to augment intake until appetite improves.    Nutritionally Relevant Medications: Scheduled Meds:  ezetimibe  10 mg Oral Daily   insulin aspart  0-9 Units Subcutaneous TID WC   levothyroxine  88 mcg Oral Q0600   rosuvastatin  5 mg Oral Daily   Continuous Infusions:  piperacillin-tazobactam (ZOSYN)  IV 12.5 mL/hr at 07/24/20 0629   PRN Meds: ondansetron polyethylene glycol  Labs Reviewed  NUTRITION - FOCUSED PHYSICAL EXAM: Flowsheet Row Most Recent Value  Orbital Region Mild depletion  Upper Arm Region No depletion  Thoracic and Lumbar Region No depletion  Buccal Region No depletion  Temple Region No depletion  Clavicle Bone Region No depletion  Clavicle and Acromion Bone Region No depletion   Scapular Bone Region No depletion  Dorsal Hand Moderate depletion  Patellar Region No depletion  Anterior Thigh Region No depletion  Posterior Calf Region No depletion  Edema (RD Assessment) None  Hair Reviewed  Eyes Reviewed  Mouth Reviewed  Skin Reviewed  Nails Reviewed   Diet Order:   Diet Order             Diet clear liquid Room service appropriate? Yes; Fluid consistency: Thin  Diet effective now                   EDUCATION NEEDS:  No education needs have been identified at this time  Skin:  Skin Assessment: Reviewed RN Assessment  Last BM:  PTA  Height:  Ht Readings from Last 1 Encounters:  07/23/20 5\' 2"  (1.575 m)   Weight:  Wt Readings from Last 1 Encounters:  07/23/20 58.4 kg   Ideal Body Weight:  50 kg  BMI:  Body mass index is 23.55 kg/m.  Estimated Nutritional Needs:  Kcal:  1400-1600 kcal/d Protein:  70-90 g/d Fluid:  >1500 mL/d   Ranell Patrick, RD, LDN Clinical Dietitian Pager on [place]

## 2020-07-24 NOTE — Plan of Care (Signed)
  Problem: Education: Goal: Knowledge of General Education information will improve Description: Including pain rating scale, medication(s)/side effects and non-pharmacologic comfort measures Outcome: Progressing   Problem: Clinical Measurements: Goal: Diagnostic test results will improve Outcome: Progressing   Problem: Coping: Goal: Level of anxiety will decrease Outcome: Progressing   Problem: Safety: Goal: Ability to remain free from injury will improve Outcome: Progressing   Problem: Skin Integrity: Goal: Risk for impaired skin integrity will decrease Outcome: Progressing

## 2020-07-24 NOTE — Progress Notes (Signed)
PROGRESS NOTE    [name]  JQG:[id number] DOB: 03-24-1933 DOA: 07/23/2020 PCP: Idelle Crouch, MD     Assessment & Plan:   Principal Problem:   Intractable nausea and vomiting Active Problems:   Thyroid cancer ([place])   Carotid artery disease ([place])   Atherosclerosis of native coronary artery with stable angina pectoris ([place])   Hyperlipidemia   Essential hypertension   Anemia   Diabetes mellitus ([place])   Cerebrovascular accident (CVA) ([place])   Arterial vascular disease   Chronic anxiety   Idiopathic gout of multiple sites   Generalized weakness   Acute diverticulitis: continue on IV zosyn.  Zofran prn for nausea/vomiting  Generalized weakness: PT/OT consulted  Normocytic anemia: H&H are stable. Will continue to monitor  Hx of CAD: s/p stents. Continue on home dose of aspirin, plavix, metoprolol & ramipril  HTN: continue on home ose of metoprolol, ramipril   DM2: likely well controlled. Continue on SSI w/ accuchecks.   Hypothyroidism: continue on home dose of synthroid. Hx of thyroid cancer    DVT prophylaxis: lovenox  Code Status: full  Family Communication: discussed pt's care w/ pt's family at bedside and answered their questions  Disposition Plan: likely d/c back home   Level of care: Med-Surg  Status is: Observation  The patient remains OBS appropriate and will d/c before 2 midnights.  Dispo: The patient is from: Home              Anticipated d/c is to: Home              Patient currently is not medically stable to d/c.   Difficult to place patient Yes    Consultants:    Procedures:   Antimicrobials:  zosyn   Subjective: Pt c/o nausea   Objective: Vitals:   07/23/20 2026 07/23/20 2350 07/24/20 0452 07/24/20 0720  BP: (!) 178/63 (!) 183/68 (!) 154/51 (!) 135/34  Pulse: 73 75 64 64  Resp: 16 18 16 15   Temp: 97.6 F (36.4 C) 98.2 F (36.8 C) 98.7 F (37.1 C) 98.2 F (36.8 C)  TempSrc: Oral Oral  Oral  SpO2: 100% 99% 100%  97%  Weight: 58.4 kg     Height:        Intake/Output Summary (Last 24 hours) at 07/24/2020 0720 Last data filed at 07/24/2020 0629 Gross per 24 hour  Intake 831.18 ml  Output --  Net 831.18 ml   Filed Weights   07/23/20 1318 07/23/20 2026  Weight: 55.3 kg 58.4 kg    Examination:  General exam: Appears calm and comfortable  Respiratory system: Clear to auscultation. Respiratory effort normal. Cardiovascular system: S1 & S2 +. No  rubs, gallops or clicks.  Gastrointestinal system: Abdomen is nondistended, soft and tenderness to palpation.  Normal bowel sounds heard. Central nervous system: Alert and oriented. Moves all extremities  Psychiatry: Judgement and insight appear normal. Mood & affect appropriate.     Data Reviewed: I have personally reviewed following labs and imaging studies  CBC: Recent Labs  Lab 07/23/20 1349 07/24/20 0458  WBC 10.3 9.0  NEUTROABS 9.7*  --   HGB 11.9* 11.3*  HCT 35.9* 34.6*  MCV 91.6 92.0  PLT 224 197   Basic Metabolic Panel: Recent Labs  Lab 07/23/20 1349 07/24/20 0458  NA 142 142  K 3.8 3.7  CL 106 108  CO2 27 24  GLUCOSE 138* 108*  BUN 22 17  CREATININE 0.70 0.86  CALCIUM 9.1 8.5*   GFR:  Estimated Creatinine Clearance: 38.5 mL/min (by C-G formula based on SCr of 0.86 mg/dL). Liver Function Tests: Recent Labs  Lab 07/23/20 1349 07/24/20 0458  AST 21 19  ALT 14 12  ALKPHOS 57 53  BILITOT 0.7 0.8  PROT 6.3* 5.7*  ALBUMIN 3.9 3.3*   Recent Labs  Lab 07/23/20 1349  LIPASE 49   No results for input(s): AMMONIA in the last 168 hours. Coagulation Profile: No results for input(s): INR, PROTIME in the last 168 hours. Cardiac Enzymes: No results for input(s): CKTOTAL, CKMB, CKMBINDEX, TROPONINI in the last 168 hours. BNP (last 3 results) No results for input(s): PROBNP in the last 8760 hours. HbA1C: Recent Labs    07/23/20 1349  HGBA1C 6.1*   CBG: Recent Labs  Lab 07/23/20 2034  GLUCAP 135*   Lipid  Profile: No results for input(s): CHOL, HDL, LDLCALC, TRIG, CHOLHDL, LDLDIRECT in the last 72 hours. Thyroid Function Tests: Recent Labs    07/23/20 1720  TSH 1.457   Anemia Panel: No results for input(s): VITAMINB12, FOLATE, FERRITIN, TIBC, IRON, RETICCTPCT in the last 72 hours. Sepsis Labs: Recent Labs  Lab 07/23/20 1349  LATICACIDVEN 1.3    Recent Results (from the past 240 hour(s))  Resp Panel by RT-PCR (Flu A&B, Covid) Nasopharyngeal Swab     Status: None   Collection Time: 07/23/20  1:49 PM   Specimen: Nasopharyngeal Swab; Nasopharyngeal(NP) swabs in vial transport medium  Result Value Ref Range Status   SARS Coronavirus 2 by RT PCR NEGATIVE NEGATIVE Final    Comment: (NOTE) SARS-CoV-2 target nucleic acids are NOT DETECTED.  The SARS-CoV-2 RNA is generally detectable in upper respiratory specimens during the acute phase of infection. The lowest concentration of SARS-CoV-2 viral copies this assay can detect is 138 copies/mL. A negative result does not preclude SARS-Cov-2 infection and should not be used as the sole basis for treatment or other patient management decisions. A negative result may occur with  improper specimen collection/handling, submission of specimen other than nasopharyngeal swab, presence of viral mutation(s) within the areas targeted by this assay, and inadequate number of viral copies(<138 copies/mL). A negative result must be combined with clinical observations, patient history, and epidemiological information. The expected result is Negative.  Fact Sheet for Patients:  EntrepreneurPulse.com.au  Fact Sheet for Healthcare Providers:  IncredibleEmployment.be  This test is no t yet approved or cleared by the [place] FDA and  has been authorized for detection and/or diagnosis of SARS-CoV-2 by FDA under an Emergency Use Authorization (EUA). This EUA will remain  in effect (meaning this test can be used) for  the duration of the COVID-19 declaration under Section 564(b)(1) of the Act, 21 U.S.C.section 360bbb-3(b)(1), unless the authorization is terminated  or revoked sooner.       Influenza A by PCR NEGATIVE NEGATIVE Final   Influenza B by PCR NEGATIVE NEGATIVE Final    Comment: (NOTE) The Xpert Xpress SARS-CoV-2/FLU/RSV plus assay is intended as an aid in the diagnosis of influenza from Nasopharyngeal swab specimens and should not be used as a sole basis for treatment. Nasal washings and aspirates are unacceptable for Xpert Xpress SARS-CoV-2/FLU/RSV testing.  Fact Sheet for Patients: EntrepreneurPulse.com.au  Fact Sheet for Healthcare Providers: IncredibleEmployment.be  This test is not yet approved or cleared by the [place] FDA and has been authorized for detection and/or diagnosis of SARS-CoV-2 by FDA under an Emergency Use Authorization (EUA). This EUA will remain in effect (meaning this test can be used) for the duration  of the COVID-19 declaration under Section 564(b)(1) of the Act, 21 U.S.C. section 360bbb-3(b)(1), unless the authorization is terminated or revoked.  Performed at [facility], [address] [place], [place] [address]          Radiology Studies: MR BRAIN WO CONTRAST  Result Date: 07/23/2020 CLINICAL DATA:  Encephalopathy EXAM: MRI HEAD WITHOUT CONTRAST TECHNIQUE: Multiplanar, multiecho pulse sequences of the brain and surrounding structures were obtained without intravenous contrast. COMPARISON:  11/18/2018 FINDINGS: Brain: No acute infarct, mass effect or extra-axial collection. No acute or chronic hemorrhage. Hyperintense T2-weighted signal is moderately widespread throughout the white matter. Generalized volume loss without a clear lobar predilection. The midline structures are normal. Vascular: Major flow voids are preserved. Skull and upper cervical spine: Normal calvarium and skull base. Visualized  upper cervical spine and soft tissues are normal. Sinuses/Orbits:No paranasal sinus fluid levels or advanced mucosal thickening. No mastoid or middle ear effusion. Normal orbits. IMPRESSION: 1. No acute intracranial abnormality. 2. Generalized volume loss and findings of chronic small vessel ischemia. Electronically Signed   By: Ulyses Jarred M.D.   On: 07/23/2020 23:19   CT ABDOMEN PELVIS W CONTRAST  Result Date: 07/23/2020 CLINICAL DATA:  Abdominal pain nonlocalized. EXAM: CT ABDOMEN AND PELVIS WITH CONTRAST TECHNIQUE: Multidetector CT imaging of the abdomen and pelvis was performed using the standard protocol following bolus administration of intravenous contrast. CONTRAST:  82mL OMNIPAQUE IOHEXOL 300 MG/ML  SOLN COMPARISON:  April 06, 2008 FINDINGS: Lower chest: Hypoventilatory change in the dependent lungs. Borderline cardiac enlargement. Coronary artery calcifications. Aortic atherosclerosis. Hepatobiliary: Diffusely decreased attenuation of the hepatic parenchyma, likely reflecting hepatic steatosis. Gallbladder surgically absent. There is prominence of the intra and extrahepatic biliary tree increased from prior but possibly representing reservoir effect post cholecystectomy. Pancreas: No pancreatic ductal dilation or evidence of acute inflammation. Spleen: Calcified splenic granuloma. 1 cm lesion in the lower aspect of the spleen on image 18/2 not readily evident on prior examination, but likely representing a benign entity such as a cyst or lymphangioma. Adrenals/Urinary Tract: Bilateral adrenal glands are unremarkable. No hydronephrosis. No solid enhancing renal masses. Urinary bladder is grossly unremarkable for degree of distension. Stomach/Bowel: Small hiatal hernia otherwise stomach is grossly unremarkable. No pathologic dilation of small bowel. The appendix is not definitely visualized however there is no pericecal inflammation. Sigmoid colonic diverticulosis with wall thickening and mild  adjacent inflammatory stranding involving the mid sigmoid colon likely reflecting mild acute uncomplicated sigmoid diverticulosis superimposed on changes of chronic diverticular disease. Vascular/Lymphatic: Aortic and branch vessel atherosclerosis without aneurysmal dilation. No pathologically enlarged abdominal or pelvic lymph nodes. Reproductive: Status post hysterectomy. No adnexal masses. Other: Fat containing posterior lumbar abdominal wall hernia with a 1.2 cm aperture with on image 27/2. No abdominopelvic ascites. No pneumoperitoneum. No walled off fluid collections. Musculoskeletal: Lumbar posterior spinal fusion and disc spacers without evidence of hardware fracture. Multilevel degenerative changes spine. Degenerative changes bilateral hips and SI joints. No acute osseous abnormality. IMPRESSION: 1. Findings suggestive of mild acute uncomplicated sigmoid diverticulosis superimposed on changes of chronic diverticular disease. However, given the appearance and degree of wall thickening suggest further evaluation with colonoscopy subsequent to therapy. 2. Hepatic steatosis. 3. Cholecystectomy with prominence of the biliary tree increased in comparison to prior but commonly reflecting reservoir effect post cholecystectomy. Consider further assessment with laboratory values to exclude obstruction. 4. Fat containing posterior lumbar abdominal wall hernia, new from prior. 5.  Aortic Atherosclerosis (ICD10-I70.0). Electronically Signed   By: Dahlia Bailiff MD  On: 07/23/2020 16:52   DG Abdomen Acute W/Chest  Result Date: 07/23/2020 CLINICAL DATA:  Vomiting EXAM: DG ABDOMEN ACUTE WITH 1 VIEW CHEST COMPARISON:  None FINDINGS: Nonobstructive bowel gas pattern. No organomegaly or free air. Prior cholecystectomy. Heart and mediastinal contours are within normal limits. No focal opacities or effusions. No acute bony abnormality. Postoperative changes in the lumbar spine. IMPRESSION: No acute findings. Electronically  Signed   By: Rolm Baptise M.D.   On: 07/23/2020 14:11        Scheduled Meds:  allopurinol  100 mg Oral BID   aspirin EC  81 mg Oral Daily   clopidogrel  75 mg Oral Daily   enoxaparin (LOVENOX) injection  40 mg Subcutaneous Q24H   ezetimibe  10 mg Oral Daily   insulin aspart  0-9 Units Subcutaneous TID WC   levothyroxine  88 mcg Oral Q0600   metoprolol succinate  50 mg Oral Daily   ramipril  10 mg Oral Daily   rosuvastatin  5 mg Oral Daily   sodium chloride flush  3 mL Intravenous Q12H   Continuous Infusions:  sodium chloride Stopped (07/24/20 0540)   piperacillin-tazobactam (ZOSYN)  IV 12.5 mL/hr at 07/24/20 0629     LOS: 0 days    Time spent: 33 mins    Wyvonnia Dusky, MD [facility] Pager [phone]  If 7PM-7AM, please contact night-coverage 07/24/2020, 7:20 AM

## 2020-07-24 NOTE — Evaluation (Signed)
Physical Therapy Evaluation Patient Details Name: [name] MRN: [id number] DOB: [birth year] Today's Date: 07/24/2020   History of Present Illness  [name] is an 72yoF who comes to [facility] 7/12 with ongoing N/V. Pt sustained a fall about 1 month prior, note also indicate recent LOC. PMH: anemia, CAD s/p DES, CAD, PAD, HLD, CVA, DM2, gout, hypoTSH, thyroid CA, diverticolusis, GAD. Brain MRI done this admission, no acute findings.  Clinical Impression  Pt admitted with above diagnosis. Pt currently with functional limitations due to the deficits listed below (see "PT Problem List"). Upon entry, pt in bed, awake and agreeable to limited participation 2/2 on going nausea, easily provoked with mobility. The pt is alert, pleasant, interactive, and able to provide info regarding prior level of function, both in tolerance and independence. Pt reports a fall and head trauma June 6, ongoing neck pain at C2 level with bilateral radiation to bilat mastoid processes, but has been able to continue with community mobility despite pain and on going nausea (cervical CT negative at that time). Pt reports she began to decline recently after her PCP started her on baclofen. Pt refuses OOB at this time until improved nausea. MMT 5/5 in BLE, BUE, grips strong. Pt denies any recent diplopia, vision loss, paresthesias. Patient's performance this date reveals decreased ability, independence, and tolerance in performing all basic mobility required for performance of activities of daily living. Pt will need OOB/AMB assessed next date to assure no acute changes to independence, tolerance, or safety. Pt will benefit from skilled PT intervention to increase independence and safety with basic mobility in preparation for discharge to the venue listed below.       Follow Up Recommendations Outpatient PT (for acute/subacute neck pain)    Equipment Recommendations  None recommended by PT    Recommendations for Other  Services       Precautions / Restrictions Precautions Precautions: Fall Restrictions Weight Bearing Restrictions: No      Mobility  Bed Mobility               General bed mobility comments: deferred, pt nauseated and recently worse nausea with sitting up during OT    Transfers                    Ambulation/Gait                Stairs            Wheelchair Mobility    Modified Rankin (Stroke Patients Only)       Balance                                             Pertinent Vitals/Pain Pain Assessment: 0-10 Pain Score: 5  Pain Location: neck pain since (07/17/20); bilat upper cervical spine    Home Living Family/patient expects to be discharged to:: Private residence Living Arrangements: Spouse/significant other Available Help at Discharge: Family Type of Home: House (Condo) Home Access: Level entry     Home Layout: One level Home Equipment: Environmental consultant - 2 wheels;Walker - 4 wheels;Bedside commode;Shower seat      Prior Function Level of Independence: Independent         Comments: Pt is out in community seevral times daily, SPC in home, 4WW in community. Fell 1x past 6 months in community.     Hand Dominance  Dominant Hand: Right    Extremity/Trunk Assessment   Upper Extremity Assessment Upper Extremity Assessment: Overall WFL for tasks assessed (5/5 bialt, strong grips)    Lower Extremity Assessment Lower Extremity Assessment: Overall WFL for tasks assessed (able to SLR bilat, able to bridge, 5/5 ankle DF/PF)       Communication      Cognition Arousal/Alertness: Awake/alert Behavior During Therapy: WFL for tasks assessed/performed Overall Cognitive Status: Within Functional Limits for tasks assessed                                        General Comments      Exercises     Assessment/Plan    PT Assessment Patient needs continued PT services  PT Problem List Decreased  strength;Decreased cognition;Decreased range of motion;Decreased activity tolerance;Decreased balance;Decreased mobility;Decreased coordination;Decreased knowledge of use of DME;Decreased knowledge of precautions       PT Treatment Interventions DME instruction;Balance training;Gait training;Stair training;Functional mobility training;Therapeutic activities;Therapeutic exercise;Patient/family education    PT Goals (Current goals can be found in the Care Plan section)  Acute Rehab PT Goals Patient Stated Goal: not have nausea anymore PT Goal Formulation: With patient Time For Goal Achievement: 08/07/20 Potential to Achieve Goals: Fair    Frequency Min 2X/week   Barriers to discharge        Co-evaluation               AM-PAC PT "6 Clicks" Mobility  Outcome Measure Help needed turning from your back to your side while in a flat bed without using bedrails?: A Lot Help needed moving from lying on your back to sitting on the side of a flat bed without using bedrails?: A Lot Help needed moving to and from a bed to a chair (including a wheelchair)?: A Lot Help needed standing up from a chair using your arms (e.g., wheelchair or bedside chair)?: A Lot Help needed to walk in hospital room?: A Lot Help needed climbing 3-5 steps with a railing? : A Lot 6 Click Score: 12    End of Session   Activity Tolerance: No increased pain;Treatment limited secondary to medical complications (Comment) (nausea) Patient left: in bed;with call bell/phone within reach;with bed alarm set;with nursing/sitter in room   PT Visit Diagnosis: Difficulty in walking, not elsewhere classified (R26.2);Other abnormalities of gait and mobility (R26.89);Muscle weakness (generalized) (M62.81);Other symptoms and signs involving the nervous system (R29.898)    Time: 4628-6381 PT Time Calculation (min) (ACUTE ONLY): 16 min   Charges:   PT Evaluation $PT Eval Moderate Complexity: 1 Mod         2:23 PM,  07/24/20 [name], PT, DPT Physical Therapist - [facility]  [phone] ([place])    [place] C 07/24/2020, 2:14 PM

## 2020-07-24 NOTE — Care Management Obs Status (Signed)
[place] NOTIFICATION   Patient Details  Name: [name] MRN: [id number] Date of Birth: [birth year]   Medicare Observation Status Notification Given:  Yes    Shelbie Hutching, RN 07/24/2020, 10:21 AM

## 2020-07-25 LAB — BASIC METABOLIC PANEL
Anion gap: 7 (ref 5–15)
BUN: 19 mg/dL (ref 8–23)
CO2: 26 mmol/L (ref 22–32)
Calcium: 8.5 mg/dL — ABNORMAL LOW (ref 8.9–10.3)
Chloride: 109 mmol/L (ref 98–111)
Creatinine, Ser: 1.03 mg/dL — ABNORMAL HIGH (ref 0.44–1.00)
GFR, Estimated: 54 mL/min — ABNORMAL LOW (ref >60)
Glucose, Bld: 92 mg/dL (ref 70–99)
Potassium: 3.7 mmol/L (ref 3.5–5.1)
Sodium: 142 mmol/L (ref 135–145)

## 2020-07-25 LAB — CBC
HCT: 34.2 % — ABNORMAL LOW (ref 36.0–46.0)
Hemoglobin: 11.1 g/dL — ABNORMAL LOW (ref 12.0–15.0)
MCH: 29.6 pg (ref 26.0–34.0)
MCHC: 32.5 g/dL (ref 30.0–36.0)
MCV: 91.2 fL (ref 80.0–100.0)
Platelets: 184 10*3/uL (ref 150–400)
RBC: 3.75 MIL/uL — ABNORMAL LOW (ref 3.87–5.11)
RDW: 15.1 % (ref 11.5–15.5)
WBC: 6.7 10*3/uL (ref 4.0–10.5)
nRBC: 0 % (ref 0.0–0.2)

## 2020-07-25 LAB — GLUCOSE, CAPILLARY
Glucose-Capillary: 99 mg/dL (ref 70–99)
Glucose-Capillary: 109 mg/dL — ABNORMAL HIGH (ref 70–99)

## 2020-07-25 MED ORDER — AMOXICILLIN-POT CLAVULANATE 875-125 MG PO TABS: 1.0000 | ORAL_TABLET | Freq: Two times a day (BID) | ORAL | 0 refills | Status: AC

## 2020-07-25 NOTE — Progress Notes (Signed)
Physical Therapy Treatment Patient Details Name: [name] MRN: [id number] DOB: [birth year] Today's Date: 07/25/2020    History of Present Illness [name] is an 64yoF who comes to [facility] 7/12 with ongoing N/V. Pt sustained a fall June 6 hitting head, contnued neck pain since, note also indicates recent LOC. PMH: anemia, CAD s/p DES, CAD, PAD, HLD, CVA, DM2, gout, hypoTSH, thyroid CA, diverticolusis, GAD. Brain MRI done this admission, no acute findings.    PT Comments    Pt supine in bed at entry, husband in room, pt says no pain, no nausea but has prophyllacticaly taken antiemetics earlier. Dense bradykinesia throughout session, concerning for safety and independence. Pt only able to AMB 60ft, then pt "feels like (she) is going to pass out. I need to sit down." Pt safely assisted back to EOB for BP check, no LOC. Still feeling poorly, pt assisted back to supine, plan to elevate HOB after NA assists with purewick.   Orthostatic VS for the past 24 hrs:  BP- Lying BP- Sitting Pulse- Sitting  07/25/20 1027 136/44 141/47 58        Follow Up Recommendations  Home health PT;Supervision for mobility/OOB     Equipment Recommendations  Wheelchair (measurements PT);Other (comment) (or transport chair for safe mobility in home; not safe to perform household distance AMB at this time)    Recommendations for Other Services       Precautions / Restrictions Precautions Precautions: Fall Restrictions Weight Bearing Restrictions: No    Mobility  Bed Mobility Overal bed mobility: Needs Assistance Bed Mobility: Supine to Sit;Sit to Supine     Supine to sit: Supervision Sit to supine: Supervision   General bed mobility comments: requires author to watch lines; requires ~3-4 minutes to fully move to EOB feet on floor.    Transfers Overall transfer level: Needs assistance Equipment used: Rolling walker (2 wheeled) Transfers: Sit to/from Stand Sit to Stand: Min guard          General transfer comment: able to rise without assist, but appears bradykinetic and weak  Ambulation/Gait Ambulation/Gait assistance: Min guard Gait Distance (Feet): 6 Feet Assistive device: Rolling walker (2 wheeled)   Gait velocity: 1.02cm/sec Gait velocity interpretation: <1.31 ft/sec, indicative of household ambulator General Gait Details: very slow, length pauses of bilatal support phase; pt says is baseline, but difficult to imagine her being able to achieve distances outside of the home at this speed. (reports presyncopal prodrome and asks for urgent return to sitting)   Stairs             Wheelchair Mobility    Modified Rankin (Stroke Patients Only)       Balance                                            Cognition Arousal/Alertness: Awake/alert Behavior During Therapy: WFL for tasks assessed/performed Overall Cognitive Status: Within Functional Limits for tasks assessed                                        Exercises      General Comments        Pertinent Vitals/Pain Pain Assessment:  (none at start, neck pain by end of session) Pain Intervention(s): Limited activity within patient's tolerance;Monitored during session;Premedicated before session  Home Living                      Prior Function            PT Goals (current goals can now be found in the care plan section) Acute Rehab PT Goals Patient Stated Goal: to get stronger and be able to go home after hospital PT Goal Formulation: With patient Time For Goal Achievement: 08/07/20 Potential to Achieve Goals: Fair Progress towards PT goals: Not progressing toward goals - comment    Frequency    Min 2X/week      PT Plan Discharge plan needs to be updated;Equipment recommendations need to be updated    Co-evaluation              AM-PAC PT "6 Clicks" Mobility   Outcome Measure  Help needed turning from your back to your  side while in a flat bed without using bedrails?: A Little Help needed moving from lying on your back to sitting on the side of a flat bed without using bedrails?: A Little Help needed moving to and from a bed to a chair (including a wheelchair)?: A Little Help needed standing up from a chair using your arms (e.g., wheelchair or bedside chair)?: A Little Help needed to walk in hospital room?: A Little Help needed climbing 3-5 steps with a railing? : A Lot 6 Click Score: 17    End of Session Equipment Utilized During Treatment: Gait belt Activity Tolerance: No increased pain;Treatment limited secondary to medical complications (Comment) (presyncope) Patient left: in bed;with call bell/phone within reach;with bed alarm set;with nursing/sitter in room;with family/visitor present Nurse Communication: Mobility status PT Visit Diagnosis: Difficulty in walking, not elsewhere classified (R26.2);Other abnormalities of gait and mobility (R26.89);Muscle weakness (generalized) (M62.81);Other symptoms and signs involving the nervous system (R29.898)     Time: 2353-6144 PT Time Calculation (min) (ACUTE ONLY): 24 min  Charges:  $Gait Training: 8-22 mins $Therapeutic Exercise: 8-22 mins                    10:47 AM, 07/25/20 [name], PT, DPT Physical Therapist - [facility]  [phone] ([place])     [place] C 07/25/2020, 10:42 AM

## 2020-07-25 NOTE — TOC Transition Note (Signed)
Transition of Care [facility]) - CM/SW Discharge Note   Patient Details  Name: [name] MRN: [id number] Date of Birth: [birth year]  [facility]) CM/SW Contact:  Shelbie Hutching, RN Phone Number: 07/25/2020, 9:17 AM   Clinical Narrative:    Patient medically cleared for discharge home with home health services today.  Patient agrees to home health services and does not have a preference of agency.  Corene Cornea with [facility] has accepted referral for PT and OT.  Patient's husband will pick her up and transport home.    Final next level of care: [place] Barriers to Discharge: Barriers Resolved   Patient Goals and CMS Choice Patient states their goals for this hospitalization and ongoing recovery are:: Patient prefers to go home and agrees with home health services CMS Medicare.gov Compare Post Acute Care list provided to:: Patient Choice offered to / list presented to : Patient  Discharge Placement                       Discharge Plan and Services                DME Arranged: N/A DME Agency: NA       HH Arranged: PT, OT [place] Agency: [place] ([facility]) Date [place]: 07/25/20 Time [place]: [phone] Representative spoke with at [place]: [place] ([place]) Interventions     Readmission Risk Interventions No flowsheet data found.

## 2020-07-25 NOTE — Discharge Summary (Signed)
Physician Discharge Summary  [name] BWL:[id number] DOB: 1933/05/04 DOA: 07/23/2020  PCP: Idelle Crouch, MD  Admit date: 07/23/2020 Discharge date: 07/25/2020  Admitted From: home  Disposition:  home w/ home health   Recommendations for Outpatient Follow-up:  Follow up with PCP in 1-2 weeks  Home Health: yes Equipment/Devices:  Discharge Condition: stable  CODE STATUS: full  Diet recommendation: Heart Healthy / Carb Modified  Brief/Interim Summary: HPI was taken from Dr. Trilby Drummer:  [name] is a 87 y.o. female with medical history significant of anemia, CAD status post DES, carotid artery disease, PAD, hyperlipidemia, CVA, diabetes, gout, arthritis, hypothyroidism with history of thyroid cancer, diverticulosis, anxiety who presents with ongoing nausea vomiting and weakness. As above patient has been having about 1 day of nausea and vomiting starting yesterday evening.  This has been resolving and she is felt significant weakness at home.  She denies sick contacts nor having any unusual foods. Vomiting noted to be green at times by ED staff. She states that with her nausea and vomiting yesterday it "felt like I was going to die ". She reports chills.  She denies fevers, chest pain, shortness of breath, abdominal pain, constipation, diarrhea   EDP was able to speak with patients daughter who reported that patient had a fall about a month ago and saw her PCP yesterday due to this as well as some headaches and neck pain.  Patient was prescribed baclofen and took it for the first time last night but was already acting confused and unsteady before taking the medication.  She reports MRI was ordered outpatient which I have confirmed with chart review.  And MRI was ordered by EDP, I agree with this.   ED Course: Vital signs in the ED significant for blood pressure in the 681L to 572I systolic.  Lab work-up showed CMP with glucose 138, protein 6.3.  CBC showed hemoglobin  stable 11.9.  Lactic acid normal, lipase normal, troponin with mild increase from 16-19.  Respiratory panel for flu and COVID-negative.  Chest x-ray with no acute abnormality.  CT abdomen pelvis pending.  Patient received IV fluids, Zofran, Reglan without significant provement in her symptoms.  Hospital course from Dr. Jimmye Norman 7/13-7/14/22: Pt was found to have acute diverticulitis was treated w/ IV zosyn inpatient and switched to po augmentin at d/c to complete the course. Pt's nausea & vomiting resolved prior to d/c. OT recommended SNF and PT initially recommended outpatient PT and then recommended HH. Pt only wanted to be d/c home and not to rehab or SNF. Home health was set up by CM prior to d/c. For more information, please see previous progress notes.   Discharge Diagnoses:  Principal Problem:   Intractable nausea and vomiting Active Problems:   Thyroid cancer ([place])   Carotid artery disease ([facility])   Atherosclerosis of native coronary artery with stable angina pectoris ([place])   Hyperlipidemia   Essential hypertension   Anemia   Diabetes mellitus ([place])   Cerebrovascular accident (CVA) ([place])   Arterial vascular disease   Chronic anxiety   Idiopathic gout of multiple sites   Generalized weakness   Diverticulitis  Acute diverticulitis: continue on IV zosyn while inpatient and switch to po augmentin at d/c.  Zofran prn for nausea/vomiting  Generalized weakness: will d/c w/ HH   Normocytic anemia: H&H are stable. Will continue to monitor  Hx of CAD: s/p stents. Continue on home dose of aspirin, plavix, metoprolol & ramipril  HTN: continue on home ose  of metoprolol, ramipril   DM2: likely well controlled. Continue on SSI w/ accuchecks.   Hypothyroidism: continue on home dose of synthroid. Hx of thyroid cancer   Discharge Instructions  Discharge Instructions     Diet - low sodium heart healthy   Complete by: As directed    Diet Carb Modified   Complete by: As directed     Discharge instructions   Complete by: As directed    F/u w/ PCP in 1 week   Increase activity slowly   Complete by: As directed       Allergies as of 07/25/2020       Reactions   Ciprofloxacin Diarrhea, Nausea And Vomiting   Codeine Diarrhea, Nausea And Vomiting   Metronidazole Diarrhea, Nausea And Vomiting, Nausea Only   Other Hives, Other (See Comments)   Distress   Sulfa Antibiotics Other (See Comments), Hives   Distress Distress   Neomycin Itching   Neomycin-bacitracin Zn-polymyx Other (See Comments)   Reaction:  Unknown  Reaction:  Unknown    Bacitracin-polymyxin B Rash   Celecoxib Itching, Rash, Other (See Comments)   Petrolatum-zinc Oxide Hives   Statins Other (See Comments)   Reaction:  Muscle pain Reaction:  Muscle pain Reaction:  Muscle pain   Vioxx [rofecoxib] Itching, Rash        Medication List     TAKE these medications    acetaminophen 325 MG tablet Commonly known as: TYLENOL Take 650 mg by mouth as needed.   allopurinol 100 MG tablet Commonly known as: ZYLOPRIM TAKE 1 TABLET(100 MG) BY MOUTH TWICE DAILY   amoxicillin-clavulanate 875-125 MG tablet Commonly known as: Augmentin Take 1 tablet by mouth 2 (two) times daily for 4 days.   baclofen 20 MG tablet Commonly known as: LIORESAL Take 20 mg by mouth 2 (two) times daily.   clopidogrel 75 MG tablet Commonly known as: PLAVIX TAKE 1 TABLET BY MOUTH  DAILY   Colchicine 0.6 MG Caps Commonly known as: Mitigare Take 0.6 mg by mouth daily as needed.   furosemide 20 MG tablet Commonly known as: LASIX Take 1 tablet (20 mg total) by mouth daily as needed (As needed for leg swelling take potassium with this.).   ICAPS AREDS 2 PO Take by mouth.   levothyroxine 88 MCG tablet Commonly known as: SYNTHROID TAKE 1 TABLET(88 MCG) BY MOUTH DAILY BEFORE BREAKFAST   metFORMIN 500 MG tablet Commonly known as: GLUCOPHAGE Take 500 mg by mouth 2 (two) times a day.   metoprolol succinate 50 MG 24 hr  tablet Commonly known as: Toprol XL Take 1 tablet (50 mg total) by mouth daily.   nitroGLYCERIN 0.4 MG SL tablet Commonly known as: NITROSTAT PLACE 1 TABLET UNDER THE TONGUE EVERY 5 MINUTES AS NEEDED FOR CHEST PAIN   potassium chloride 10 MEQ tablet Commonly known as: KLOR-CON Take 1 tablet (10 mEq total) by mouth daily as needed (As needed with Furosemide.).   rosuvastatin 5 MG tablet Commonly known as: CRESTOR TAKE 1 TABLET(5 MG) BY MOUTH DAILY. MAY   VITAMIN B-1 PO Take by mouth daily.       ASK your doctor about these medications    ALPRAZolam 0.25 MG tablet Commonly known as: XANAX Take 0.25 mg by mouth at bedtime as needed for anxiety or sleep.   augmented betamethasone dipropionate 0.05 % ointment Commonly known as: DIPROLENE-AF Apply topically.   azelastine 0.1 % nasal spray Commonly known as: ASTELIN Place into the nose.   Cholecalciferol 25 MCG (1000  UT) tablet Take 1,000 Units by mouth daily.   ezetimibe 10 MG tablet Commonly known as: ZETIA Take 1 tablet (10 mg total) by mouth daily.   fluticasone 50 MCG/ACT nasal spray Commonly known as: FLONASE Place into the nose.   ketoconazole 2 % shampoo Commonly known as: NIZORAL Apply topically.   ramipril 10 MG capsule Commonly known as: ALTACE TAKE 1 CAPSULE(10 MG) BY MOUTH DAILY               Durable Medical Equipment  (From admission, onward)           Start     Ordered   07/25/20 1045  For home use only DME lightweight manual wheelchair with seat cushion  Once       Comments: Patient suffers from weakness and dizziness related to intractable nausea and vomiting and history of CVA which impairs their ability to perform daily activities like bathing, toileting, ambulating, grooming in the home.  A walker will not resolve  issue with performing activities of daily living. A wheelchair will allow patient to safely perform daily activities. Patient is not able to propel themselves in the  home using a standard weight wheelchair due to endurance. Patient can self propel in the lightweight wheelchair. Length of need 6 months . Accessories: elevating leg rests (ELRs), wheel locks, extensions and anti-tippers, seat cushion and back cushion.   07/25/20 1047   07/25/20 0937  For home use only DME Shower stool  Once        07/25/20 0936            Follow-up Information     [facility].   Specialty: [place] Why: [place] will call you to schedule your initial visit.  If you have questions call the office [phone]               Allergies  Allergen Reactions   Ciprofloxacin Diarrhea and Nausea And Vomiting   Codeine Diarrhea and Nausea And Vomiting   Metronidazole Diarrhea, Nausea And Vomiting and Nausea Only   Other Hives and Other (See Comments)    Distress   Sulfa Antibiotics Other (See Comments) and Hives    Distress Distress   Neomycin Itching   Neomycin-Bacitracin Zn-Polymyx Other (See Comments)    Reaction:  Unknown  Reaction:  Unknown    Bacitracin-Polymyxin B Rash   Celecoxib Itching, Rash and Other (See Comments)   Petrolatum-Zinc Oxide Hives   Statins Other (See Comments)    Reaction:  Muscle pain Reaction:  Muscle pain Reaction:  Muscle pain   Vioxx [Rofecoxib] Itching and Rash    Consultations:    Procedures/Studies: MR BRAIN WO CONTRAST  Result Date: 07/23/2020 CLINICAL DATA:  Encephalopathy EXAM: MRI HEAD WITHOUT CONTRAST TECHNIQUE: Multiplanar, multiecho pulse sequences of the brain and surrounding structures were obtained without intravenous contrast. COMPARISON:  11/18/2018 FINDINGS: Brain: No acute infarct, mass effect or extra-axial collection. No acute or chronic hemorrhage. Hyperintense T2-weighted signal is moderately widespread throughout the white matter. Generalized volume loss without a clear lobar predilection. The midline structures are normal. Vascular: Major flow  voids are preserved. Skull and upper cervical spine: Normal calvarium and skull base. Visualized upper cervical spine and soft tissues are normal. Sinuses/Orbits:No paranasal sinus fluid levels or advanced mucosal thickening. No mastoid or middle ear effusion. Normal orbits. IMPRESSION: 1. No acute intracranial abnormality. 2. Generalized volume loss and findings of chronic small vessel ischemia. Electronically Signed  By: Ulyses Jarred M.D.   On: 07/23/2020 23:19   CT ABDOMEN PELVIS W CONTRAST  Result Date: 07/23/2020 CLINICAL DATA:  Abdominal pain nonlocalized. EXAM: CT ABDOMEN AND PELVIS WITH CONTRAST TECHNIQUE: Multidetector CT imaging of the abdomen and pelvis was performed using the standard protocol following bolus administration of intravenous contrast. CONTRAST:  89mL OMNIPAQUE IOHEXOL 300 MG/ML  SOLN COMPARISON:  April 06, 2008 FINDINGS: Lower chest: Hypoventilatory change in the dependent lungs. Borderline cardiac enlargement. Coronary artery calcifications. Aortic atherosclerosis. Hepatobiliary: Diffusely decreased attenuation of the hepatic parenchyma, likely reflecting hepatic steatosis. Gallbladder surgically absent. There is prominence of the intra and extrahepatic biliary tree increased from prior but possibly representing reservoir effect post cholecystectomy. Pancreas: No pancreatic ductal dilation or evidence of acute inflammation. Spleen: Calcified splenic granuloma. 1 cm lesion in the lower aspect of the spleen on image 18/2 not readily evident on prior examination, but likely representing a benign entity such as a cyst or lymphangioma. Adrenals/Urinary Tract: Bilateral adrenal glands are unremarkable. No hydronephrosis. No solid enhancing renal masses. Urinary bladder is grossly unremarkable for degree of distension. Stomach/Bowel: Small hiatal hernia otherwise stomach is grossly unremarkable. No pathologic dilation of small bowel. The appendix is not definitely visualized however there  is no pericecal inflammation. Sigmoid colonic diverticulosis with wall thickening and mild adjacent inflammatory stranding involving the mid sigmoid colon likely reflecting mild acute uncomplicated sigmoid diverticulosis superimposed on changes of chronic diverticular disease. Vascular/Lymphatic: Aortic and branch vessel atherosclerosis without aneurysmal dilation. No pathologically enlarged abdominal or pelvic lymph nodes. Reproductive: Status post hysterectomy. No adnexal masses. Other: Fat containing posterior lumbar abdominal wall hernia with a 1.2 cm aperture with on image 27/2. No abdominopelvic ascites. No pneumoperitoneum. No walled off fluid collections. Musculoskeletal: Lumbar posterior spinal fusion and disc spacers without evidence of hardware fracture. Multilevel degenerative changes spine. Degenerative changes bilateral hips and SI joints. No acute osseous abnormality. IMPRESSION: 1. Findings suggestive of mild acute uncomplicated sigmoid diverticulosis superimposed on changes of chronic diverticular disease. However, given the appearance and degree of wall thickening suggest further evaluation with colonoscopy subsequent to therapy. 2. Hepatic steatosis. 3. Cholecystectomy with prominence of the biliary tree increased in comparison to prior but commonly reflecting reservoir effect post cholecystectomy. Consider further assessment with laboratory values to exclude obstruction. 4. Fat containing posterior lumbar abdominal wall hernia, new from prior. 5.  Aortic Atherosclerosis (ICD10-I70.0). Electronically Signed   By: Dahlia Bailiff MD   On: 07/23/2020 16:52   DG Abdomen Acute W/Chest  Result Date: 07/23/2020 CLINICAL DATA:  Vomiting EXAM: DG ABDOMEN ACUTE WITH 1 VIEW CHEST COMPARISON:  None FINDINGS: Nonobstructive bowel gas pattern. No organomegaly or free air. Prior cholecystectomy. Heart and mediastinal contours are within normal limits. No focal opacities or effusions. No acute bony  abnormality. Postoperative changes in the lumbar spine. IMPRESSION: No acute findings. Electronically Signed   By: Rolm Baptise M.D.   On: 07/23/2020 14:11      Subjective: Pt c/o fatigue. Pt denies any nausea, vomiting or abd pain    Discharge Exam: Vitals:   07/25/20 0521 07/25/20 0714  BP: (!) 151/61 (!) 152/48  Pulse: (!) 58 (!) 55  Resp: 18 16  Temp: 98 F (36.7 C) 98.1 F (36.7 C)  SpO2: 95% 98%   Vitals:   07/24/20 1937 07/24/20 2317 07/25/20 0521 07/25/20 0714  BP: (!) 119/41 (!) 128/47 (!) 151/61 (!) 152/48  Pulse: (!) 56 (!) 57 (!) 58 (!) 55  Resp: 18 18 18  16  Temp: 98.1 F (36.7 C) 98.1 F (36.7 C) 98 F (36.7 C) 98.1 F (36.7 C)  TempSrc:   Oral Oral  SpO2: 98% 98% 95% 98%  Weight:      Height:        General: Pt is alert, awake, not in acute distress Cardiovascular: S1/S2 +, no rubs, no gallops Respiratory: CTA bilaterally, no wheezing, no rhonchi Abdominal: Soft, NT, ND, bowel sounds + Extremities: no cyanosis    The results of significant diagnostics from this hospitalization (including imaging, microbiology, ancillary and laboratory) are listed below for reference.     Microbiology: Recent Results (from the past 240 hour(s))  Resp Panel by RT-PCR (Flu A&B, Covid) Nasopharyngeal Swab     Status: None   Collection Time: 07/23/20  1:49 PM   Specimen: Nasopharyngeal Swab; Nasopharyngeal(NP) swabs in vial transport medium  Result Value Ref Range Status   SARS Coronavirus 2 by RT PCR NEGATIVE NEGATIVE Final    Comment: (NOTE) SARS-CoV-2 target nucleic acids are NOT DETECTED.  The SARS-CoV-2 RNA is generally detectable in upper respiratory specimens during the acute phase of infection. The lowest concentration of SARS-CoV-2 viral copies this assay can detect is 138 copies/mL. A negative result does not preclude SARS-Cov-2 infection and should not be used as the sole basis for treatment or other patient management decisions. A negative result may  occur with  improper specimen collection/handling, submission of specimen other than nasopharyngeal swab, presence of viral mutation(s) within the areas targeted by this assay, and inadequate number of viral copies(<138 copies/mL). A negative result must be combined with clinical observations, patient history, and epidemiological information. The expected result is Negative.  Fact Sheet for Patients:  EntrepreneurPulse.com.au  Fact Sheet for Healthcare Providers:  IncredibleEmployment.be  This test is no t yet approved or cleared by the [place] FDA and  has been authorized for detection and/or diagnosis of SARS-CoV-2 by FDA under an Emergency Use Authorization (EUA). This EUA will remain  in effect (meaning this test can be used) for the duration of the COVID-19 declaration under Section 564(b)(1) of the Act, 21 U.S.C.section 360bbb-3(b)(1), unless the authorization is terminated  or revoked sooner.       Influenza A by PCR NEGATIVE NEGATIVE Final   Influenza B by PCR NEGATIVE NEGATIVE Final    Comment: (NOTE) The Xpert Xpress SARS-CoV-2/FLU/RSV plus assay is intended as an aid in the diagnosis of influenza from Nasopharyngeal swab specimens and should not be used as a sole basis for treatment. Nasal washings and aspirates are unacceptable for Xpert Xpress SARS-CoV-2/FLU/RSV testing.  Fact Sheet for Patients: EntrepreneurPulse.com.au  Fact Sheet for Healthcare Providers: IncredibleEmployment.be  This test is not yet approved or cleared by the [place] FDA and has been authorized for detection and/or diagnosis of SARS-CoV-2 by FDA under an Emergency Use Authorization (EUA). This EUA will remain in effect (meaning this test can be used) for the duration of the COVID-19 declaration under Section 564(b)(1) of the Act, 21 U.S.C. section 360bbb-3(b)(1), unless the authorization is terminated  or revoked.  Performed at [facility], [address] [place], [place] [address]      Labs: BNP (last 3 results) No results for input(s): BNP in the last 8760 hours. Basic Metabolic Panel: Recent Labs  Lab 07/23/20 1349 07/24/20 0458 07/25/20 0504  NA 142 142 142  K 3.8 3.7 3.7  CL 106 108 109  CO2 27 24 26   GLUCOSE 138* 108* 92  BUN 22 17 19  CREATININE 0.70 0.86 1.03*  CALCIUM 9.1 8.5* 8.5*   Liver Function Tests: Recent Labs  Lab 07/23/20 1349 07/24/20 0458  AST 21 19  ALT 14 12  ALKPHOS 57 53  BILITOT 0.7 0.8  PROT 6.3* 5.7*  ALBUMIN 3.9 3.3*   Recent Labs  Lab 07/23/20 1349  LIPASE 49   No results for input(s): AMMONIA in the last 168 hours. CBC: Recent Labs  Lab 07/23/20 1349 07/24/20 0458 07/25/20 0504  WBC 10.3 9.0 6.7  NEUTROABS 9.7*  --   --   HGB 11.9* 11.3* 11.1*  HCT 35.9* 34.6* 34.2*  MCV 91.6 92.0 91.2  PLT 224 185 184   Cardiac Enzymes: No results for input(s): CKTOTAL, CKMB, CKMBINDEX, TROPONINI in the last 168 hours. BNP: Invalid input(s): POCBNP CBG: Recent Labs  Lab 07/24/20 0719 07/24/20 1156 07/24/20 1729 07/24/20 2205 07/25/20 0726  GLUCAP 101* 129* 196* 150* 99   D-Dimer No results for input(s): DDIMER in the last 72 hours. Hgb A1c Recent Labs    07/23/20 1349  HGBA1C 6.1*   Lipid Profile No results for input(s): CHOL, HDL, LDLCALC, TRIG, CHOLHDL, LDLDIRECT in the last 72 hours. Thyroid function studies Recent Labs    07/23/20 1720  TSH 1.457   Anemia work up No results for input(s): VITAMINB12, FOLATE, FERRITIN, TIBC, IRON, RETICCTPCT in the last 72 hours. Urinalysis    Component Value Date/Time   COLORURINE STRAW (A) 07/23/2020 1830   APPEARANCEUR HAZY (A) 07/23/2020 1830   APPEARANCEUR Hazy 10/31/2013 1407   LABSPEC 1.034 (H) 07/23/2020 1830   LABSPEC 1.011 10/31/2013 1407   PHURINE 8.0 07/23/2020 1830   GLUCOSEU NEGATIVE 07/23/2020 1830   GLUCOSEU Negative 10/31/2013 1407    HGBUR NEGATIVE 07/23/2020 1830   BILIRUBINUR NEGATIVE 07/23/2020 1830   BILIRUBINUR Negative 10/31/2013 1407   KETONESUR 20 (A) 07/23/2020 1830   PROTEINUR 100 (A) 07/23/2020 1830   UROBILINOGEN 0.2 09/20/2009 1206   NITRITE NEGATIVE 07/23/2020 1830   LEUKOCYTESUR NEGATIVE 07/23/2020 1830   LEUKOCYTESUR Trace 10/31/2013 1407   Sepsis Labs Invalid input(s): PROCALCITONIN,  WBC,  LACTICIDVEN Microbiology Recent Results (from the past 240 hour(s))  Resp Panel by RT-PCR (Flu A&B, Covid) Nasopharyngeal Swab     Status: None   Collection Time: 07/23/20  1:49 PM   Specimen: Nasopharyngeal Swab; Nasopharyngeal(NP) swabs in vial transport medium  Result Value Ref Range Status   SARS Coronavirus 2 by RT PCR NEGATIVE NEGATIVE Final    Comment: (NOTE) SARS-CoV-2 target nucleic acids are NOT DETECTED.  The SARS-CoV-2 RNA is generally detectable in upper respiratory specimens during the acute phase of infection. The lowest concentration of SARS-CoV-2 viral copies this assay can detect is 138 copies/mL. A negative result does not preclude SARS-Cov-2 infection and should not be used as the sole basis for treatment or other patient management decisions. A negative result may occur with  improper specimen collection/handling, submission of specimen other than nasopharyngeal swab, presence of viral mutation(s) within the areas targeted by this assay, and inadequate number of viral copies(<138 copies/mL). A negative result must be combined with clinical observations, patient history, and epidemiological information. The expected result is Negative.  Fact Sheet for Patients:  EntrepreneurPulse.com.au  Fact Sheet for Healthcare Providers:  IncredibleEmployment.be  This test is no t yet approved or cleared by the [place] FDA and  has been authorized for detection and/or diagnosis of SARS-CoV-2 by FDA under an Emergency Use Authorization (EUA). This EUA  will remain  in effect (meaning this  test can be used) for the duration of the COVID-19 declaration under Section 564(b)(1) of the Act, 21 U.S.C.section 360bbb-3(b)(1), unless the authorization is terminated  or revoked sooner.       Influenza A by PCR NEGATIVE NEGATIVE Final   Influenza B by PCR NEGATIVE NEGATIVE Final    Comment: (NOTE) The Xpert Xpress SARS-CoV-2/FLU/RSV plus assay is intended as an aid in the diagnosis of influenza from Nasopharyngeal swab specimens and should not be used as a sole basis for treatment. Nasal washings and aspirates are unacceptable for Xpert Xpress SARS-CoV-2/FLU/RSV testing.  Fact Sheet for Patients: EntrepreneurPulse.com.au  Fact Sheet for Healthcare Providers: IncredibleEmployment.be  This test is not yet approved or cleared by the [place] FDA and has been authorized for detection and/or diagnosis of SARS-CoV-2 by FDA under an Emergency Use Authorization (EUA). This EUA will remain in effect (meaning this test can be used) for the duration of the COVID-19 declaration under Section 564(b)(1) of the Act, 21 U.S.C. section 360bbb-3(b)(1), unless the authorization is terminated or revoked.  Performed at [facility], [address] [place], [place] [address]      Time coordinating discharge: Over 30 minutes  SIGNED:   Wyvonnia Dusky, MD  [facility] 07/25/2020, 11:19 AM Pager   If 7PM-7AM, please contact night-coverage [email or web address]

## 2020-07-25 NOTE — Progress Notes (Signed)
    Durable Medical Equipment  (From admission, onward)           Start     Ordered   07/25/20 1045  For home use only DME lightweight manual wheelchair with seat cushion  Once       Comments: Patient suffers from weakness and dizziness related to intractable nausea and vomiting and history of CVA which impairs their ability to perform daily activities like bathing, toileting, ambulating, grooming in the home.  A walker will not resolve  issue with performing activities of daily living. A wheelchair will allow patient to safely perform daily activities. Patient is not able to propel themselves in the home using a standard weight wheelchair due to endurance. Patient can self propel in the lightweight wheelchair. Length of need 6 months . Accessories: elevating leg rests (ELRs), wheel locks, extensions and anti-tippers, seat cushion and back cushion.   07/25/20 1047   07/25/20 0937  For home use only DME Shower stool  Once        07/25/20 0936

## 2020-07-29 ENCOUNTER — Other Ambulatory Visit: Payer: Self-pay | Admitting: Internal Medicine

## 2020-07-29 ENCOUNTER — Telehealth: Payer: Self-pay | Admitting: *Deleted

## 2020-07-29 DIAGNOSIS — C73 Malignant neoplasm of thyroid gland: Secondary | ICD-10-CM

## 2020-07-29 NOTE — Telephone Encounter (Signed)
Pharmacy sent a RF request for synthroid. Pt has not been seen since 01/26/2019. Request declined. Pt need follow-up with Dr. Tish Men.   I will send msg to scheduling to arrange for lab/md follow-up

## 2020-07-30 ENCOUNTER — Telehealth: Payer: Self-pay | Admitting: Internal Medicine

## 2020-07-30 ENCOUNTER — Ambulatory Visit: Payer: Medicare Other

## 2020-07-30 NOTE — Telephone Encounter (Signed)
Attempt made to contact patient in order to schedule an appt for labs/MD. Per RN-patient has not been seen since 2021 and must come to the office in order to obtain a refill on her medications.

## 2020-07-30 NOTE — Telephone Encounter (Signed)
Received communication via scheduling message to get patient scheduled for lab and MD for medication refill. Pt did not answer and the VM is full.

## 2020-07-31 ENCOUNTER — Encounter: Payer: Self-pay | Admitting: *Deleted

## 2020-07-31 NOTE — Telephone Encounter (Signed)
Letter sent to patient.

## 2020-08-09 ENCOUNTER — Other Ambulatory Visit: Payer: Self-pay | Admitting: Internal Medicine

## 2020-08-09 DIAGNOSIS — C73 Malignant neoplasm of thyroid gland: Secondary | ICD-10-CM

## 2020-08-17 ENCOUNTER — Other Ambulatory Visit: Payer: Self-pay | Admitting: Cardiovascular Disease

## 2020-08-17 DIAGNOSIS — I2510 Atherosclerotic heart disease of native coronary artery without angina pectoris: Secondary | ICD-10-CM

## 2020-08-19 ENCOUNTER — Telehealth: Payer: Self-pay | Admitting: Cardiovascular Disease

## 2020-08-19 MED ORDER — NITROGLYCERIN 0.4 MG SL SUBL: 0.4000 mg | SUBLINGUAL_TABLET | SUBLINGUAL | 0 refills | Status: DC | PRN

## 2020-08-19 NOTE — Telephone Encounter (Signed)
Requested Prescriptions   Signed Prescriptions Disp Refills   nitroGLYCERIN (NITROSTAT) 0.4 MG SL tablet 25 tablet 0    Sig: Place 1 tablet (0.4 mg total) under the tongue every 5 (five) minutes as needed for chest pain.    Authorizing Provider: Minna Merritts    Ordering User: Britt Bottom

## 2020-08-19 NOTE — Telephone Encounter (Signed)
*  STAT* If patient is at the pharmacy, call can be transferred to refill team.   1. Which medications need to be refilled? (please list name of each medication and dose if known) Nitroglycerin 1 tablet as needed  2. Which pharmacy/location (including street and city if local pharmacy) is medication to be sent to? [place], [place]  3. Do they need a 30 day or 90 day supply? 90 day

## 2020-08-25 ENCOUNTER — Other Ambulatory Visit: Payer: Self-pay | Admitting: Cardiovascular Disease

## 2020-08-25 ENCOUNTER — Inpatient Hospital Stay
Admission: EM | Admit: 2020-08-25 | Discharge: 2020-08-27 | DRG: 101 | Disposition: A | Discharge status: Home / Self Care | Payer: Medicare Other | Attending: Internal Medicine | Admitting: Internal Medicine

## 2020-08-25 ENCOUNTER — Emergency Department: Payer: Medicare Other

## 2020-08-25 ENCOUNTER — Inpatient Hospital Stay: Payer: Medicare Other

## 2020-08-25 ENCOUNTER — Other Ambulatory Visit: Payer: Self-pay

## 2020-08-25 DIAGNOSIS — E119 Type 2 diabetes mellitus without complications: Secondary | ICD-10-CM | POA: Diagnosis present

## 2020-08-25 DIAGNOSIS — Z7984 Long term (current) use of oral hypoglycemic drugs: Secondary | ICD-10-CM

## 2020-08-25 DIAGNOSIS — E890 Postprocedural hypothyroidism: Secondary | ICD-10-CM | POA: Diagnosis present

## 2020-08-25 DIAGNOSIS — Z79890 Hormone replacement therapy (postmenopausal): Secondary | ICD-10-CM

## 2020-08-25 DIAGNOSIS — K219 Gastro-esophageal reflux disease without esophagitis: Secondary | ICD-10-CM | POA: Diagnosis present

## 2020-08-25 DIAGNOSIS — I10 Essential (primary) hypertension: Secondary | ICD-10-CM | POA: Diagnosis present

## 2020-08-25 DIAGNOSIS — R4182 Altered mental status, unspecified: Secondary | ICD-10-CM

## 2020-08-25 DIAGNOSIS — E872 Acidosis: Secondary | ICD-10-CM | POA: Diagnosis present

## 2020-08-25 DIAGNOSIS — Z888 Allergy status to other drugs, medicaments and biological substances status: Secondary | ICD-10-CM

## 2020-08-25 DIAGNOSIS — Z8673 Personal history of transient ischemic attack (TIA), and cerebral infarction without residual deficits: Secondary | ICD-10-CM | POA: Diagnosis not present

## 2020-08-25 DIAGNOSIS — Z79899 Other long term (current) drug therapy: Secondary | ICD-10-CM | POA: Diagnosis not present

## 2020-08-25 DIAGNOSIS — G4089 Other seizures: Secondary | ICD-10-CM

## 2020-08-25 DIAGNOSIS — R569 Unspecified convulsions: Secondary | ICD-10-CM | POA: Diagnosis present

## 2020-08-25 DIAGNOSIS — Z882 Allergy status to sulfonamides status: Secondary | ICD-10-CM

## 2020-08-25 DIAGNOSIS — E785 Hyperlipidemia, unspecified: Secondary | ICD-10-CM | POA: Diagnosis present

## 2020-08-25 DIAGNOSIS — I2510 Atherosclerotic heart disease of native coronary artery without angina pectoris: Secondary | ICD-10-CM | POA: Diagnosis present

## 2020-08-25 DIAGNOSIS — Z20822 Contact with and (suspected) exposure to covid-19: Secondary | ICD-10-CM | POA: Diagnosis present

## 2020-08-25 DIAGNOSIS — R410 Disorientation, unspecified: Secondary | ICD-10-CM

## 2020-08-25 DIAGNOSIS — Z85850 Personal history of malignant neoplasm of thyroid: Secondary | ICD-10-CM | POA: Diagnosis not present

## 2020-08-25 DIAGNOSIS — Z7902 Long term (current) use of antithrombotics/antiplatelets: Secondary | ICD-10-CM

## 2020-08-25 DIAGNOSIS — Z8249 Family history of ischemic heart disease and other diseases of the circulatory system: Secondary | ICD-10-CM

## 2020-08-25 DIAGNOSIS — M069 Rheumatoid arthritis, unspecified: Secondary | ICD-10-CM | POA: Diagnosis present

## 2020-08-25 DIAGNOSIS — Z955 Presence of coronary angioplasty implant and graft: Secondary | ICD-10-CM

## 2020-08-25 DIAGNOSIS — Z885 Allergy status to narcotic agent status: Secondary | ICD-10-CM

## 2020-08-25 DIAGNOSIS — F419 Anxiety disorder, unspecified: Secondary | ICD-10-CM | POA: Diagnosis present

## 2020-08-25 DIAGNOSIS — Z881 Allergy status to other antibiotic agents status: Secondary | ICD-10-CM | POA: Diagnosis not present

## 2020-08-25 LAB — URINALYSIS, COMPLETE (UACMP) WITH MICROSCOPIC
Bacteria, UA: NONE SEEN
Bilirubin Urine: NEGATIVE
Glucose, UA: NEGATIVE mg/dL
Hgb urine dipstick: NEGATIVE
Ketones, ur: NEGATIVE mg/dL
Leukocytes,Ua: NEGATIVE
Nitrite: NEGATIVE
Protein, ur: 100 mg/dL — AB
Specific Gravity, Urine: 1.017 (ref 1.005–1.030)
Squamous Epithelial / HPF: NONE SEEN (ref 0–5)
pH: 5 (ref 5.0–8.0)

## 2020-08-25 LAB — RESP PANEL BY RT-PCR (FLU A&B, COVID) ARPGX2
Influenza A by PCR: NEGATIVE
Influenza B by PCR: NEGATIVE
SARS Coronavirus 2 by RT PCR: NEGATIVE

## 2020-08-25 LAB — COMPREHENSIVE METABOLIC PANEL
ALT: 14 U/L (ref 0–44)
AST: 34 U/L (ref 15–41)
Albumin: 4.1 g/dL (ref 3.5–5.0)
Alkaline Phosphatase: 68 U/L (ref 38–126)
Anion gap: 10 (ref 5–15)
BUN: 28 mg/dL — ABNORMAL HIGH (ref 8–23)
CO2: 22 mmol/L (ref 22–32)
Calcium: 9.1 mg/dL (ref 8.9–10.3)
Chloride: 105 mmol/L (ref 98–111)
Creatinine, Ser: 0.83 mg/dL (ref 0.44–1.00)
GFR, Estimated: >60 mL/min (ref >60)
Glucose, Bld: 171 mg/dL — ABNORMAL HIGH (ref 70–99)
Potassium: 4.7 mmol/L (ref 3.5–5.1)
Sodium: 137 mmol/L (ref 135–145)
Total Bilirubin: 0.8 mg/dL (ref 0.3–1.2)
Total Protein: 7.1 g/dL (ref 6.5–8.1)

## 2020-08-25 LAB — CBC WITH DIFFERENTIAL/PLATELET
Abs Immature Granulocytes: 0.08 10*3/uL — ABNORMAL HIGH (ref 0.00–0.07)
Basophils Absolute: 0 10*3/uL (ref 0.0–0.1)
Basophils Relative: 0 %
Eosinophils Absolute: 0 10*3/uL (ref 0.0–0.5)
Eosinophils Relative: 0 %
HCT: 37.1 % (ref 36.0–46.0)
Hemoglobin: 12 g/dL (ref 12.0–15.0)
Immature Granulocytes: 1 %
Lymphocytes Relative: 11 %
Lymphs Abs: 1 10*3/uL (ref 0.7–4.0)
MCH: 30.2 pg (ref 26.0–34.0)
MCHC: 32.3 g/dL (ref 30.0–36.0)
MCV: 93.2 fL (ref 80.0–100.0)
Monocytes Absolute: 0.2 10*3/uL (ref 0.1–1.0)
Monocytes Relative: 2 %
Neutro Abs: 7.7 10*3/uL (ref 1.7–7.7)
Neutrophils Relative %: 86 %
Platelets: 243 10*3/uL (ref 150–400)
RBC: 3.98 MIL/uL (ref 3.87–5.11)
RDW: 15.6 % — ABNORMAL HIGH (ref 11.5–15.5)
WBC: 9.1 10*3/uL (ref 4.0–10.5)
nRBC: 0 % (ref 0.0–0.2)

## 2020-08-25 LAB — LACTIC ACID, PLASMA
Lactic Acid, Venous: 4.3 mmol/L (ref 0.5–1.9)
Lactic Acid, Venous: 5.9 mmol/L (ref 0.5–1.9)
Lactic Acid, Venous: 1.9 mmol/L (ref 0.5–1.9)
Lactic Acid, Venous: 1.5 mmol/L (ref 0.5–1.9)

## 2020-08-25 LAB — PROTIME-INR
INR: 0.9 (ref 0.8–1.2)
Prothrombin Time: 12.6 seconds (ref 11.4–15.2)

## 2020-08-25 LAB — APTT: aPTT: 24 seconds (ref 24–36)

## 2020-08-25 LAB — LIPASE, BLOOD: Lipase: 46 U/L (ref 11–51)

## 2020-08-25 LAB — MAGNESIUM: Magnesium: 1.8 mg/dL (ref 1.7–2.4)

## 2020-08-25 MED ORDER — SODIUM CHLORIDE 0.9 % IV SOLN
75.0000 mL/h | INTRAVENOUS | Status: DC
  Administered 2020-08-25 (×3): 75 mL/h via INTRAVENOUS

## 2020-08-25 MED ORDER — ROSUVASTATIN CALCIUM 10 MG PO TABS
5.0000 mg | ORAL_TABLET | Freq: Every day | ORAL | Status: DC
  Administered 2020-08-26 – 2020-08-27 (×2): 5 mg via ORAL
  Filled 2020-08-25 (×2): qty 1

## 2020-08-25 MED ORDER — LORAZEPAM 2 MG/ML IJ SOLN: 4.0000 mg | INTRAMUSCULAR | Status: DC | PRN

## 2020-08-25 MED ORDER — LEVETIRACETAM IN NACL 1500 MG/100ML IV SOLN
1500.0000 mg | Freq: Once | INTRAVENOUS | Status: AC
  Administered 2020-08-25: 1500 mg via INTRAVENOUS
  Filled 2020-08-25: qty 100

## 2020-08-25 MED ORDER — LEVOTHYROXINE SODIUM 88 MCG PO TABS
88.0000 ug | ORAL_TABLET | Freq: Every day | ORAL | Status: DC
  Administered 2020-08-26 – 2020-08-27 (×2): 88 ug via ORAL
  Filled 2020-08-25 (×3): qty 1

## 2020-08-25 MED ORDER — METOPROLOL SUCCINATE ER 50 MG PO TB24
50.0000 mg | ORAL_TABLET | Freq: Every day | ORAL | Status: DC
  Administered 2020-08-26 – 2020-08-27 (×2): 50 mg via ORAL
  Filled 2020-08-25 (×2): qty 1

## 2020-08-25 MED ORDER — ONDANSETRON HCL 4 MG/2ML IJ SOLN
4.0000 mg | Freq: Once | INTRAMUSCULAR | Status: DC
Start: 2020-08-25 — End: 2020-08-27
  Filled 2020-08-25: qty 2

## 2020-08-25 MED ORDER — LACTATED RINGERS IV BOLUS
1000.0000 mL | Freq: Once | INTRAVENOUS | Status: AC
  Administered 2020-08-25: 1000 mL via INTRAVENOUS

## 2020-08-25 MED ORDER — LEVETIRACETAM IN NACL 500 MG/100ML IV SOLN
500.0000 mg | Freq: Two times a day (BID) | INTRAVENOUS | Status: DC
  Administered 2020-08-26 – 2020-08-27 (×3): 500 mg via INTRAVENOUS
  Filled 2020-08-25 (×4): qty 100

## 2020-08-25 MED ORDER — LORAZEPAM 2 MG/ML IJ SOLN
1.0000 mg | Freq: Once | INTRAMUSCULAR | Status: AC
  Administered 2020-08-25: 22:00:00 1 mg via INTRAVENOUS
  Filled 2020-08-25: qty 1

## 2020-08-25 MED ORDER — ENOXAPARIN SODIUM 40 MG/0.4ML IJ SOSY
40.0000 mg | PREFILLED_SYRINGE | INTRAMUSCULAR | Status: DC
  Administered 2020-08-25 – 2020-08-26 (×2): 40 mg via SUBCUTANEOUS
  Filled 2020-08-25 (×3): qty 0.4

## 2020-08-25 MED ORDER — GADOBUTROL 1 MMOL/ML IV SOLN
7.0000 mL | Freq: Once | INTRAVENOUS | Status: AC | PRN
  Administered 2020-08-25: 7 mL via INTRAVENOUS

## 2020-08-25 MED ORDER — CHLORHEXIDINE GLUCONATE 0.12% ORAL RINSE (MEDLINE KIT)
15.0000 mL | Freq: Two times a day (BID) | OROMUCOSAL | Status: DC
  Administered 2020-08-26: 15 mL via OROMUCOSAL
  Filled 2020-08-25 (×2): qty 15

## 2020-08-25 MED ORDER — LORAZEPAM 1 MG PO TABS: 1.0000 mg | ORAL_TABLET | Freq: Once | ORAL | Status: DC

## 2020-08-25 MED ORDER — METFORMIN HCL 500 MG PO TABS
500.0000 mg | ORAL_TABLET | Freq: Two times a day (BID) | ORAL | Status: DC
  Administered 2020-08-26: 500 mg via ORAL
  Filled 2020-08-25 (×2): qty 1

## 2020-08-25 MED ORDER — ONDANSETRON HCL 4 MG/2ML IJ SOLN
4.0000 mg | Freq: Once | INTRAMUSCULAR | Status: AC
  Administered 2020-08-25: 4 mg via INTRAVENOUS
  Filled 2020-08-25: qty 2

## 2020-08-25 MED ORDER — NITROGLYCERIN 0.4 MG SL SUBL: 0.4000 mg | SUBLINGUAL_TABLET | SUBLINGUAL | Status: DC | PRN

## 2020-08-25 NOTE — ED Notes (Signed)
New IV does not draw back as well as first IV. ED tech attempted to stick. Lab being called to come draw third lactic.

## 2020-08-25 NOTE — ED Notes (Signed)
Dr Kyung Bacca at bedside. Neurologist will see pt. Pt now responding verbally. Dr Kyung Bacca messaged to inform of upward trend lactic acid.

## 2020-08-25 NOTE — ED Provider Notes (Signed)
[facility] Emergency Department Provider Note ____________________________________________   Event Date/Time   First MD Initiated Contact with Patient 08/25/20 1136     (approximate)  I have reviewed the triage vital signs and the nursing notes.  HISTORY  Chief Complaint Altered Mental Status   HPI [name] is a 87 y.o. femalewho presents to the ED for evaluation of altered mentation.   Chart review indicates hx CAD, PAD, HTN, HLD, CVA, DM, hypothyroidism.   Pt presents to the ED via EMS from home for evaluation of confusion and emesis.  Per EMS, she was "a little bit off" yesterday, she was "vomiting all night" and then this morning family found her unresponsive and confused.  Due to this change in her mental status, she was sent to the ED for evaluation.  Here in the ED, patient was poorly responsive and unable to provide any relevant history.  Upon my reassessments and after family has arrived, unable to obtain some additional history.  Patient reports amnesia to the events of late last night and early this morning. Husband reports that they had chicken for dinner last night and he had some diarrhea afterwards and patient threw up a few times of nonbloody nonbilious emesis.  But she seemed okay behaviorally last night.  Husband reports that after awakening this morning, he was unable to awaken his wife and she was "totally unresponsive" this morning.  Past Medical History:  Diagnosis Date   CAD (coronary artery disease)    a. lexiscan 08/2013: nl wall motion, no ischemia, EF 50-55%; b. cardiac cath 05/31/2014: mLAD 90%, dLAD 50%, dLCx 60%, 1st Mrg 80%, pRCA 60% s/p PCI/DES, mRCA 1st lesion 70%, mRCA 2nd 95% s/p PCI/long DES to cover entire mRCA, RPLB 40%;  c. 05/2014 Staged PCI of LAD w/ 2.5 x 15 mm Xience Alpine DES.   Carotid artery disease (HCC)    Chronic anxiety    Diverticulosis    Esophagitis    GERD (gastroesophageal reflux disease)     History of colon polyps    History of echocardiogram    a. 08/2013: normal LVSF, nl RVSF, no valvular regurgitation or stenosis    Hyperlipidemia    Hypertension    Hypothyroidism    Mild reactive airways disease    Osteoarthritis    PONV (postoperative nausea and vomiting)    Rheumatoid arthritis ([place])    Stroke ([place])    a. 05/2014 pt c/o garbled speech following cath 5/19. MRI/A 5/26 showed left caudate infarct->conservative mgmt per neuro.   Thyroid cancer ([place])    a. s/p right sided hemi-thyroidectomy; b. planning for radioactive iodine tx; c. followed by Dr. Marcelene Butte   Type 2 diabetes mellitus [facility])     Patient Active Problem List   Diagnosis Date Noted   Diverticulitis 07/24/2020   Intractable nausea and vomiting 07/23/2020   Generalized weakness 07/23/2020   Achilles tendon contracture, bilateral 06/29/2017   Acute bilateral low back pain without sciatica 03/17/2016   Idiopathic chronic gout of multiple sites without tophus 03/17/2016   Idiopathic gout of multiple sites 11/27/2015   Cellulitis 05/15/2015   Bilateral leg edema 02/08/2015   Chronic anxiety 11/22/2014   Cerebrovascular accident (CVA) ([place]) 07/25/2014   Arterial vascular disease 07/25/2014   Swelling of right lower extremity 06/22/2014   Diabetes mellitus ([place])    Stroke ([place]) 06/08/2014   Anemia 06/07/2014   Unstable angina ([place]) 06/06/2014   Carotid artery disease ([place])    Atherosclerosis of native  coronary artery with stable angina pectoris ([place])    Hyperlipidemia    Essential hypertension    Angina pectoris ([place]) 05/31/2014   SOB (shortness of breath) 04/11/2014   Palpitations 04/11/2014   Other fatigue 04/11/2014   Thyroid cancer ([employer]) 04/11/2014    Past Surgical History:  Procedure Laterality Date   ABDOMINAL HYSTERECTOMY     APPENDECTOMY     AUGMENTATION MAMMAPLASTY     BACK SURGERY     BREAST IMPLANT REMOVAL  06/2001   CARDIAC CATHETERIZATION N/A 06/06/2014   Procedure: Coronary/Graft  Atherectomy;  Surgeon: Wellington Hampshire, MD;  Location: [place] CV LAB;  Service: Cardiovascular;  Laterality: N/A;   CARDIAC CATHETERIZATION N/A 05/31/2014   Procedure: Left Heart Cath;  Surgeon: Minna Merritts, MD;  Location: [place] CV LAB;  Service: Cardiovascular;  Laterality: N/A;   CARDIAC CATHETERIZATION N/A 05/31/2014   Procedure: Coronary Stent Intervention;  Surgeon: Wellington Hampshire, MD;  Location: [place] CV LAB;  Service: Cardiovascular;  Laterality: N/A;   CARPAL TUNNEL RELEASE     "I've have a total of 7" (06/06/2014)   CATARACT EXTRACTION W/ INTRAOCULAR LENS  IMPLANT, BILATERAL Bilateral    CHOLECYSTECTOMY     CORONARY ANGIOPLASTY WITH STENT PLACEMENT  05/31/2014; 06/06/2014   "2; 1"   GANGLION CYST EXCISION Right    AC joint   LUMBAR LAMINECTOMY  X 6   MENISCUS REPAIR Bilateral    "2 on the right; 1 on the left"   ROTATOR CUFF REPAIR     "I think 2 left, 2 right"   THYROIDECTOMY     TONSILLECTOMY     TUBAL LIGATION      Prior to Admission medications   Medication Sig Start Date End Date Taking? Authorizing Provider  acetaminophen (TYLENOL) 325 MG tablet Take 650 mg by mouth as needed.    [provider]  allopurinol (ZYLOPRIM) 100 MG tablet TAKE 1 TABLET(100 MG) BY MOUTH TWICE DAILY 11/14/18   Newt Minion, MD  ALPRAZolam Duanne Moron) 0.25 MG tablet Take 0.25 mg by mouth at bedtime as needed for anxiety or sleep.  Patient not taking: Reported on 07/23/2020 08/23/13   [provider]  augmented betamethasone dipropionate (DIPROLENE-AF) 0.05 % ointment Apply topically. Patient not taking: Reported on 07/23/2020 11/06/19   [provider]  azelastine (ASTELIN) 0.1 % nasal spray Place into the nose. Patient not taking: Reported on 07/23/2020 06/16/19   [provider]  baclofen (LIORESAL) 20 MG tablet Take 20 mg by mouth 2 (two) times daily.    [provider]  Cholecalciferol 1000 UNITS tablet Take 1,000 Units by mouth  daily.  Patient not taking: Reported on 07/23/2020    [provider]  clopidogrel (PLAVIX) 75 MG tablet TAKE 1 TABLET BY MOUTH  DAILY 08/19/20   Minna Merritts, MD  Colchicine (MITIGARE) 0.6 MG CAPS Take 0.6 mg by mouth daily as needed. 03/05/20   Minna Merritts, MD  ezetimibe (ZETIA) 10 MG tablet Take 1 tablet (10 mg total) by mouth daily. Patient not taking: Reported on 07/23/2020 06/05/19   Minna Merritts, MD  fluticasone [facility]) 50 MCG/ACT nasal spray Place into the nose. Patient not taking: Reported on 07/23/2020 06/08/19   [provider]  furosemide (LASIX) 20 MG tablet Take 1 tablet (20 mg total) by mouth daily as needed (As needed for leg swelling take potassium with this.). 06/27/18 06/05/19  Minna Merritts, MD  ketoconazole (NIZORAL) 2 % shampoo Apply  topically. Patient not taking: No sig reported 01/19/20   [provider]  levothyroxine (SYNTHROID) 88 MCG tablet TAKE 1 TABLET(88 MCG) BY MOUTH DAILY BEFORE BREAKFAST 08/12/20   Cammie Sickle, MD  metFORMIN (GLUCOPHAGE) 500 MG tablet Take 500 mg by mouth 2 (two) times a day. 05/10/18   [provider]  metoprolol succinate (TOPROL XL) 50 MG 24 hr tablet Take 1 tablet (50 mg total) by mouth daily. 05/13/15   Minna Merritts, MD  Multiple Vitamins-Minerals (ICAPS AREDS 2 PO) Take by mouth.    [provider]  nitroGLYCERIN (NITROSTAT) 0.4 MG SL tablet Place 1 tablet (0.4 mg total) under the tongue every 5 (five) minutes as needed for chest pain. 08/19/20   Minna Merritts, MD  potassium chloride (K-DUR) 10 MEQ tablet Take 1 tablet (10 mEq total) by mouth daily as needed (As needed with Furosemide.). 06/27/18 06/05/19  Minna Merritts, MD  ramipril (ALTACE) 10 MG capsule TAKE 1 CAPSULE(10 MG) BY MOUTH DAILY Patient not taking: Reported on 07/23/2020 05/13/15   Minna Merritts, MD  rosuvastatin (CRESTOR) 5 MG tablet TAKE 1 TABLET(5 MG) BY MOUTH DAILY. MAY 05/22/20   Minna Merritts, MD   Thiamine HCl (VITAMIN B-1 PO) Take by mouth daily. Patient not taking: Reported on 07/23/2020    [provider]    Allergies Ciprofloxacin, Codeine, Metronidazole, Other, Sulfa antibiotics, Neomycin, Neomycin-bacitracin zn-polymyx, Bacitracin-polymyxin b, Celecoxib, Petrolatum-zinc oxide, Statins, and Vioxx [rofecoxib]  Family History  Problem Relation Age of Onset   Heart disease Sister        stent placed x 3     Social History Social History   Tobacco Use   Smoking status: Never   Smokeless tobacco: Never  Substance Use Topics   Alcohol use: No   Drug use: No    Review of Systems  Unable to be accurately assessed due to patient's altered mentation. ____________________________________________   PHYSICAL EXAM:  VITAL SIGNS: Vitals:   08/25/20 1330 08/25/20 1415  BP: (!) 189/80   Pulse: 81 98  Resp: 15 15  Temp:    SpO2: 96% 100%     Constitutional: Alert and oriented to self and location only.  Disoriented to day of the week and year. Well appearing and in no acute distress. Eyes: Conjunctivae are normal. PERRL. EOMI. Head: Atraumatic. Nose: No congestion/rhinnorhea. Mouth/Throat: Mucous membranes are dry.  Oropharynx non-erythematous. Neck: No stridor. No cervical spine tenderness to palpation. Cardiovascular: Normal rate, regular rhythm. Grossly normal heart sounds.  Good peripheral circulation. Respiratory: Normal respiratory effort.  No retractions. Lungs CTAB. Gastrointestinal: Soft , nondistended. No CVA tenderness. Mild diffuse tenderness without localizing or peritoneal features. Musculoskeletal: No lower extremity tenderness nor edema.  No joint effusions. No signs of acute trauma. Neurologic:  Normal speech and language. No gross focal neurologic deficits are appreciated.  Skin:  Skin is warm, dry and intact. No rash noted. Psychiatric: Mood and affect are normal. Speech and behavior are  normal.  ____________________________________________   LABS (all labs ordered are listed, but only abnormal results are displayed)  Labs Reviewed  LACTIC ACID, PLASMA - Abnormal; Notable for the following components:      Result Value   Lactic Acid, Venous 4.3 (*)    All other components within normal limits  LACTIC ACID, PLASMA - Abnormal; Notable for the following components:   Lactic Acid, Venous 5.9 (*)    All other components within normal limits  COMPREHENSIVE METABOLIC PANEL - Abnormal; Notable  for the following components:   Glucose, Bld 171 (*)    BUN 28 (*)    All other components within normal limits  CBC WITH DIFFERENTIAL/PLATELET - Abnormal; Notable for the following components:   RDW 15.6 (*)    Abs Immature Granulocytes 0.08 (*)    All other components within normal limits  URINALYSIS, COMPLETE (UACMP) WITH MICROSCOPIC - Abnormal; Notable for the following components:   Color, Urine YELLOW (*)    APPearance CLEAR (*)    Protein, ur 100 (*)    All other components within normal limits  RESP PANEL BY RT-PCR (FLU A&B, COVID) [id number]  URINE CULTURE  CULTURE, BLOOD (ROUTINE X 2)  CULTURE, BLOOD (ROUTINE X 2)  PROTIME-INR  APTT  LIPASE, BLOOD  MAGNESIUM   ____________________________________________  12 Lead EKG  Sinus rhythm, rate of 93 bpm.  Normal axis and intervals.  No evidence of acute ischemia. ____________________________________________  RADIOLOGY  ED MD interpretation: 1 view CXR reviewed by me without evidence of acute cardiopulmonary pathology. CT head reviewed by me without evidence of acute cranial pathology.  Official radiology report(s): CT HEAD WO CONTRAST (5MM)  Result Date: 08/25/2020 CLINICAL DATA:  Mental status change, unknown cause EXAM: CT HEAD WITHOUT CONTRAST TECHNIQUE: Contiguous axial images were obtained from the base of the skull through the vertex without intravenous contrast. COMPARISON:  06/17/2020 FINDINGS: Brain: No  evidence of acute infarction, hemorrhage, hydrocephalus, extra-axial collection or mass lesion/mass effect. Mild low-density changes within the periventricular and subcortical white matter compatible with chronic microvascular ischemic change. Mild diffuse cerebral volume loss. Vascular: Atherosclerotic calcifications involving the large vessels of the skull base. No unexpected hyperdense vessel. Skull: Normal. Negative for fracture or focal lesion. Sinuses/Orbits: No acute finding. Other: Erosive changes again noted at the C1-C2 articulation with pannus formation along the posterior aspect of the dens. This appears unchanged compared to the previous study. IMPRESSION: 1. No acute intracranial findings. 2. Chronic microvascular ischemic change and cerebral volume loss. 3. Chronic arthropathy again seen at C1-C2, likely inflammatory or crystalline. Electronically Signed   By: Davina Poke D.O.   On: 08/25/2020 13:06   DG Chest Port 1 View  Result Date: 08/25/2020 CLINICAL DATA:  Questionable sepsis - evaluate for abnormality. hypoxic. Altered mental status EXAM: PORTABLE CHEST 1 VIEW COMPARISON:  12/08/2017 FINDINGS: Lungs are clear. No pneumothorax or pleural effusion. Cardiac size within normal limits. Pulmonary vascularity is normal. Osseous structures are age-appropriate. No acute bone abnormality. IMPRESSION: No active disease. Electronically Signed   By: Fidela Salisbury M.D.   On: 08/25/2020 12:21    ____________________________________________   PROCEDURES and INTERVENTIONS  Procedure(s) performed (including Critical Care):  .1-3 Lead EKG Interpretation  Date/Time: 08/25/2020 1:35 PM Performed by: Vladimir Crofts, MD Authorized by: Vladimir Crofts, MD     Interpretation: normal     ECG rate:  80   ECG rate assessment: normal     Rhythm: sinus rhythm     Ectopy: none     Conduction: normal   .Critical Care  Date/Time: 08/25/2020 3:15 PM Performed by: Vladimir Crofts, MD Authorized by:  Vladimir Crofts, MD   Critical care provider statement:    Critical care time (minutes):  45   Critical care was necessary to treat or prevent imminent or life-threatening deterioration of the following conditions:  CNS failure or compromise   Critical care was time spent personally by me on the following activities:  Discussions with consultants, evaluation of patient's response to treatment, examination of patient,  ordering and performing treatments and interventions, ordering and review of laboratory studies, ordering and review of radiographic studies, pulse oximetry, re-evaluation of patient's condition, obtaining history from patient or surrogate and review of old charts  Medications  levETIRAcetam (KEPPRA) IVPB 500 mg/100 mL premix (has no administration in time range)  lactated ringers bolus 1,000 mL (1,000 mLs Intravenous New Bag/Given 08/25/20 1211)  ondansetron (ZOFRAN) injection 4 mg (4 mg Intravenous Given 08/25/20 1442)  levETIRAcetam (KEPPRA) IVPB 1500 mg/ 100 mL premix (0 mg Intravenous Stopped 08/25/20 1425)    ____________________________________________   MDM / ED COURSE   87 year old woman presents to the ED altered with evidence of new onset seizures as a likely etiology requiring neurology consultation and medical admission.  She presents confused with nonfocal neurologic exam and depressed mental status, in retrospect was likely a postictal state.  She improves over the first couple hours of her stay, then has a witnessed seizure by me with generalized tonic-clonic activity and right-sided head deviation.  This resolved in about 1 minute prior to benzodiazepine administration, and no further seizures were noted in the ED.  Loaded with Keppra and discussed the case with neurology who agrees to evaluate.  No evidence of ICH or mass on her CT head and blood work shows isolated lactic acidosis, likely from seizure at home.  We will admit to medicine for further work-up and  management.  Clinical Course as of 08/25/20 1515  Sun Aug 25, 2020  1214 Reassessed.  Patient more awake.  Does not recall what happened. Denies pain [DS]  46 Called daughter, Amy, went straight to VM. Left VM. [DS]  [id number].  Patient is more awake.  She answers questions appropriately, but has amnesia of the events of this morning and late last night.  She reports feeling "yucky" and nauseous without pain or abdominal pain.  Daughter and husband are at the bedside and provides additional history. [DS]  [id number] to the bedside due to seizure activity.  I immediately assessed the patient and she is tachycardic, obviously jerking motions of her extremities and right-sided head and eye deviation.  Lips are gray.  Provided nonrebreather O2 support and jaw thrust and her seizure resolves within 1 minute. [DS]  1403 Dr. Cheral Marker paged [DS]  1433 I discussed the case with neurology, Dr. Cheral Marker who agrees to consult [DS]  1446 I discuss with Dr. Kyung Bacca, who agrees to admit [DS]    Clinical Course User Index [DS] Vladimir Crofts, MD    ____________________________________________   FINAL CLINICAL IMPRESSION(S) / ED DIAGNOSES  Final diagnoses:  Altered mental status, unspecified altered mental status type  Confusion  New onset seizure [facility])     ED Discharge Orders     None        Kayle Passarelli Tamala Julian   Note:  This document was prepared using Dragon voice recognition software and may include unintentional dictation errors.    Vladimir Crofts, MD 08/25/20 [phone]

## 2020-08-25 NOTE — ED Notes (Signed)
Peri care, bed change, linen change performed.

## 2020-08-25 NOTE — Consult Note (Addendum)
NEURO HOSPITALIST CONSULT NOTE   Requestig physician: Dr. Tamala Julian  Reason for Consult: New onset seizure  History obtained from:  Patient and Chart     HPI:                                                                                                                                          [name] is an 87 y.o. female with a PMHx of CAD s/p stenting, carotid artery disease, HLD, HTN, hypothyroidism, RA, stroke in 2016 (garbled speech, left caudate infarct), thyroid cancer s/p right hemithyroidectomy and DM2 who presented to the ED this morning for altered mental status. Yesterday, she had not been eating well. She then vomited several times overnight. She was found unresponsive by family this AM and EMS was called. On arrival EMS noted that she had wet her bed. CBG in 150s and VS normal per EMS. After arriving to the ED, she was able to follow commands but was mute, responding to questions by nodding. By about 1 PM, she was able to respond verbally, but had no memory of the vomiting that had occurred overnight. Lactate was elevated. WBC was normal and she was afebrile.   At about 2 PM, the patient was noted to have a seizure with tonic-clonic jerking of right arm and eyes deviated to the right; right side of face was "pulling" to that side; the seizure lasted for about 60 seconds. She was then loaded with IV Keppra 1500 mg.   At baseline she ambulates and speaks normally. She has frequent UTIs.  Of note, about one month ago she fell and struck her head losing consciousness for a brief period of time. The fall occurred onto pavement in a parking lot.    Past Medical History:  Diagnosis Date   CAD (coronary artery disease)    a. lexiscan 08/2013: nl wall motion, no ischemia, EF 50-55%; b. cardiac cath 05/31/2014: mLAD 90%, dLAD 50%, dLCx 60%, 1st Mrg 80%, pRCA 60% s/p PCI/DES, mRCA 1st lesion 70%, mRCA 2nd 95% s/p PCI/long DES to cover entire mRCA, RPLB 40%;  c.  05/2014 Staged PCI of LAD w/ 2.5 x 15 mm Xience Alpine DES.   Carotid artery disease (HCC)    Chronic anxiety    Diverticulosis    Esophagitis    GERD (gastroesophageal reflux disease)    History of colon polyps    History of echocardiogram    a. 08/2013: normal LVSF, nl RVSF, no valvular regurgitation or stenosis    Hyperlipidemia    Hypertension    Hypothyroidism    Mild reactive airways disease    Osteoarthritis    PONV (postoperative nausea and vomiting)    Rheumatoid arthritis ([place])    Stroke ([place])    a. 05/2014 pt  c/o garbled speech following cath 5/19. MRI/A 5/26 showed left caudate infarct->conservative mgmt per neuro.   Thyroid cancer ([place])    a. s/p right sided hemi-thyroidectomy; b. planning for radioactive iodine tx; c. followed by Dr. Marcelene Butte   Type 2 diabetes mellitus [facility])     Past Surgical History:  Procedure Laterality Date   ABDOMINAL HYSTERECTOMY     APPENDECTOMY     AUGMENTATION MAMMAPLASTY     BACK SURGERY     BREAST IMPLANT REMOVAL  06/2001   CARDIAC CATHETERIZATION N/A 06/06/2014   Procedure: Coronary/Graft Atherectomy;  Surgeon: Wellington Hampshire, MD;  Location: [place] [facility];  Service: Cardiovascular;  Laterality: N/A;   CARDIAC CATHETERIZATION N/A 05/31/2014   Procedure: Left Heart Cath;  Surgeon: Minna Merritts, MD;  Location: [place] [facility];  Service: Cardiovascular;  Laterality: N/A;   CARDIAC CATHETERIZATION N/A 05/31/2014   Procedure: Coronary Stent Intervention;  Surgeon: Wellington Hampshire, MD;  Location: [place] [facility];  Service: Cardiovascular;  Laterality: N/A;   CARPAL TUNNEL RELEASE     "I've have a total of 7" (06/06/2014)   CATARACT EXTRACTION W/ INTRAOCULAR LENS  IMPLANT, BILATERAL Bilateral    CHOLECYSTECTOMY     CORONARY ANGIOPLASTY WITH STENT PLACEMENT  05/31/2014; 06/06/2014   "2; 1"   GANGLION CYST EXCISION Right    AC joint   LUMBAR LAMINECTOMY  X 6   MENISCUS REPAIR Bilateral    "2 on the right; 1 on the left"    ROTATOR CUFF REPAIR     "I think 2 left, 2 right"   THYROIDECTOMY     TONSILLECTOMY     TUBAL LIGATION      Family History  Problem Relation Age of Onset   Heart disease Sister        stent placed x 3             Social History:  reports that she has never smoked. She has never used smokeless tobacco. She reports that she does not drink alcohol and does not use drugs.  Allergies  Allergen Reactions   Ciprofloxacin Diarrhea and Nausea And Vomiting   Codeine Diarrhea and Nausea And Vomiting   Metronidazole Diarrhea, Nausea And Vomiting and Nausea Only   Other Hives and Other (See Comments)    Distress   Sulfa Antibiotics Other (See Comments) and Hives    Distress Distress   Neomycin Itching   Neomycin-Bacitracin Zn-Polymyx Other (See Comments)    Reaction:  Unknown  Reaction:  Unknown    Bacitracin-Polymyxin B Rash   Celecoxib Itching, Rash and Other (See Comments)   Petrolatum-Zinc Oxide Hives   Statins Other (See Comments)    Reaction:  Muscle pain Reaction:  Muscle pain Reaction:  Muscle pain   Vioxx [Rofecoxib] Itching and Rash    MEDICATIONS:  No current facility-administered medications on file prior to encounter.   Current Outpatient Medications on File Prior to Encounter  Medication Sig Dispense Refill   acetaminophen (TYLENOL) 500 MG tablet Take 500-1,000 mg by mouth every 6 (six) hours as needed for mild pain or moderate pain.     allopurinol (ZYLOPRIM) 100 MG tablet TAKE 1 TABLET(100 MG) BY MOUTH TWICE DAILY 60 tablet 3   clopidogrel (PLAVIX) 75 MG tablet TAKE 1 TABLET BY MOUTH  DAILY 90 tablet 3   Colchicine (MITIGARE) 0.6 MG CAPS Take 0.6 mg by mouth daily as needed. 30 capsule 4   furosemide (LASIX) 20 MG tablet Take 20 mg by mouth daily as needed for fluid. (Take with POTASSIUM)     levothyroxine (SYNTHROID) 88 MCG tablet TAKE 1 TABLET(88  MCG) BY MOUTH DAILY BEFORE BREAKFAST 90 tablet 0   metFORMIN (GLUCOPHAGE) 500 MG tablet Take 500 mg by mouth 2 (two) times a day.     metoprolol succinate (TOPROL XL) 50 MG 24 hr tablet Take 1 tablet (50 mg total) by mouth daily. 90 tablet 3   Multiple Vitamins-Minerals (ICAPS AREDS 2 PO) Take 1 tablet by mouth as directed.     nitroGLYCERIN (NITROSTAT) 0.4 MG SL tablet Place 1 tablet (0.4 mg total) under the tongue every 5 (five) minutes as needed for chest pain. 25 tablet 0   potassium chloride (KLOR-CON) 10 MEQ tablet Take 10 mEq by mouth daily as needed (nutrient replenishment). (Take with FUROSEMIDE)     rosuvastatin (CRESTOR) 5 MG tablet TAKE 1 TABLET(5 MG) BY MOUTH DAILY. MAY 90 tablet 2      ROS:                                                                                                                                       The patient is unable to provide a ROS due to lethargy.    Blood pressure (!) 189/80, pulse 98, temperature (!) 96.9 F (36.1 C), temperature source Rectal, resp. rate 15, height '5\' 3"'$  (1.6 m), weight 72.6 kg, SpO2 100 %.   General Examination:                                                                                                       Physical Exam  HEENT-  [place]/AT. Mucous membranes somewhat dry.   Lungs- Respirations unlabored Extremities- Warm and well perfused   Neurological Examination Mental Status: Postictally lethargic. Will briefly open eyes halfway to noxious stimuli combined  with loud verbal stimuli. While partially awakened, is able to reply with one-word answers that she is in the hospital, the city and state, but not the month, day of week or year. Unable to test speech fluency or naming. Does not follow commands, but will move limbs semipurposefully to noxious.  Cranial Nerves: II: Briefly gazes at examiner after noxious stimulus. Unable to test visual fields. Pupils are equal.   III,IV, VI: Eyes are conjugate and near the midline.  No forced gaze deviation or nystagmus noted.  V: Reacts to eyelid stimulation bilaterally VII: Mouth moves symmetrically during brief one-word answers to questions. No gross facial droop.  VIII: Hearing intact to some questions IX,X: Unable to assess XI: Unable to assess XII: Unable to assess Motor/Sensory: Moves BUE semipurposefully antigravity and also swats examiner's hands in response to noxious stimuli, without asymmetry. Reacts to light arm pinch bilaterally.  Briskly withdraws BLE to noxious plantar stimulation without asymmetry Deep Tendon Reflexes: Normoactive x 4 Plantars: Right: downgoing   Left: downgoing Cerebellar/Gait: Unable to assess   Lab Results: Basic Metabolic Panel: Recent Labs  Lab 08/25/20 1137  NA 137  K 4.7  CL 105  CO2 22  GLUCOSE 171*  BUN 28*  CREATININE 0.83  CALCIUM 9.1    CBC: Recent Labs  Lab 08/25/20 1137  WBC 9.1  NEUTROABS 7.7  HGB 12.0  HCT 37.1  MCV 93.2  PLT 243    Cardiac Enzymes: No results for input(s): CKTOTAL, CKMB, CKMBINDEX, TROPONINI in the last 168 hours.  Lipid Panel: No results for input(s): CHOL, TRIG, HDL, CHOLHDL, VLDL, LDLCALC in the last 168 hours.  Imaging: CT HEAD WO CONTRAST (5MM)  Result Date: 08/25/2020 CLINICAL DATA:  Mental status change, unknown cause EXAM: CT HEAD WITHOUT CONTRAST TECHNIQUE: Contiguous axial images were obtained from the base of the skull through the vertex without intravenous contrast. COMPARISON:  06/17/2020 FINDINGS: Brain: No evidence of acute infarction, hemorrhage, hydrocephalus, extra-axial collection or mass lesion/mass effect. Mild low-density changes within the periventricular and subcortical white matter compatible with chronic microvascular ischemic change. Mild diffuse cerebral volume loss. Vascular: Atherosclerotic calcifications involving the large vessels of the skull base. No unexpected hyperdense vessel. Skull: Normal. Negative for fracture or focal lesion.  Sinuses/Orbits: No acute finding. Other: Erosive changes again noted at the C1-C2 articulation with pannus formation along the posterior aspect of the dens. This appears unchanged compared to the previous study. IMPRESSION: 1. No acute intracranial findings. 2. Chronic microvascular ischemic change and cerebral volume loss. 3. Chronic arthropathy again seen at C1-C2, likely inflammatory or crystalline. Electronically Signed   By: Davina Poke D.O.   On: 08/25/2020 13:06   DG Chest Port 1 View  Result Date: 08/25/2020 CLINICAL DATA:  Questionable sepsis - evaluate for abnormality. hypoxic. Altered mental status EXAM: PORTABLE CHEST 1 VIEW COMPARISON:  12/08/2017 FINDINGS: Lungs are clear. No pneumothorax or pleural effusion. Cardiac size within normal limits. Pulmonary vascularity is normal. Osseous structures are age-appropriate. No acute bone abnormality. IMPRESSION: No active disease. Electronically Signed   By: Fidela Salisbury M.D.   On: 08/25/2020 12:21     Assessment: 87 year old female with new-onset GTC seizure in the ED after presenting after being found in bed unresponsive and incontinent of urine at home.  1. Exam reveals postictal lethargy without focal weakness or sensory deficit. No clinical seizure activity seen at the time of neurology exam.  2. Na and Ca are normal. Elevated BUN/Cr ratio is suggestive of volume depletion.  Normal white count.  3. Elevated lactate of 4.3 prior to her witnessed seizure in the ED suggests that she may also have had an unwitnessed seizure at home and was found unresponsive due to postictal state.  4. CT head: Mild chronic microvascular ischemic change and cerebral volume loss. Hippocampal volumes essentially normal. No acute abnormality.  5. Incidentally imaged on CT is chronic arthropathy at C1-C2, likely inflammatory or crystalline. 6. If she sustained a small cortical contusion during her fall with LOC about one month ago (as described by family), this  could serve as an epileptogenic focus.   Recommendations: 1. EEG (ordered) 2. MRI brain with contrast to assess for possible epileptogenic lesion (ordered) 3. Mg level (ordered) 4. Scheduled Keppra at 500 mg IV BID. Switch to PO when able (ordered).  5. Inpatient seizure precautions 6. When she is fully alert, will need to discuss outpatient seizure precautions/restrictions with patient and family: Per [facility] statutes, patients with seizures are not allowed to drive until  they have been seizure-free for six months. Use caution when using heavy equipment or power tools. Avoid working on ladders or at heights. Take showers instead of baths. Ensure the water temperature is not too high on the home water heater. Do not go swimming alone. When caring for infants or small children, sit down when holding, feeding, or changing them to minimize risk of injury to the child in the event you have a seizure. Also, maintain good sleep hygiene. Avoid alcohol.    Electronically signed: Dr. Kerney Elbe 08/25/2020, 2:30 PM

## 2020-08-25 NOTE — ED Notes (Signed)
Request made for transport  

## 2020-08-25 NOTE — ED Notes (Signed)
Called lab. They will run lipase off of previous specimen.

## 2020-08-25 NOTE — H&P (Signed)
History and Physical  [name] [id number] DOB: October 27, 1933 DOA: 08/25/2020  Referring physician: Dr. Tamala Julian PCP: Idelle Crouch, MD  Outpatient Specialists: None Patient coming from: Home & is able to ambulate: No  Chief Complaint: Altered mental status  HPI: [name] is a 87 y.o. female with medical history significant for coronary artery disease status post stent, carotid artery disease, hyperlipidemia, hypertension, hypothyroidism, rheumatoid arthritis, stroke in 2016, thyroid cancer status post hemithyroidectomy, type 2 diabetes mellitus, who presented to the emergency department via EMS because family noted she was exhibiting altered mental status.  Daughter is at bedside she stated that they are she was acting herself they went out to have dinner last nigh with no complaints was ambulating well.  She was stated to have vomited several times overnight and then this morning she was found unresponsive by family.  EMS was called and on arrival they noted that patient had wet her bed blood sugar was 150s and vital signs were normal.  Patient on arrival to the emergency department with mutes.  He denies any prior history of seizures but did say she had fallen in the parking lots about a about a month ago and has been complaining of headaches since the fall.  Today she had no recollection of the event of yesterday evening or having had dinner yesterday    ED Course: About 2 PM while in the ED she was noted to have a seizure it was a tonic-clonic seizure with jerking of her right arm and her eyes were deviated to the right and her right face was pulling to the side.  He stated the seizure was witnessed and lasted about 60 seconds and the ED physician loaded her with IV Keppra 1500 mg and given Ativan IV.  Patient had head CT and MRI in the emergency department that were negative.  Her initial lactic acid was high prior to the second seizure document that she probably had a  seizure while she was at home that was unwitnessed.  Patient was given IV fluid rehydration recheck of her lactic was still elevated but the third check it has come back down to normal of 1.9 her UA has proteinuria with no infection  Review of Systems: Patient unable to stay due to altered mental status     Past Medical History:  Diagnosis Date   CAD (coronary artery disease)    a. lexiscan 08/2013: nl wall motion, no ischemia, EF 50-55%; b. cardiac cath 05/31/2014: mLAD 90%, dLAD 50%, dLCx 60%, 1st Mrg 80%, pRCA 60% s/p PCI/DES, mRCA 1st lesion 70%, mRCA 2nd 95% s/p PCI/long DES to cover entire mRCA, RPLB 40%;  c. 05/2014 Staged PCI of LAD w/ 2.5 x 15 mm Xience Alpine DES.   Carotid artery disease (HCC)    Chronic anxiety    Diverticulosis    Esophagitis    GERD (gastroesophageal reflux disease)    History of colon polyps    History of echocardiogram    a. 08/2013: normal LVSF, nl RVSF, no valvular regurgitation or stenosis    Hyperlipidemia    Hypertension    Hypothyroidism    Mild reactive airways disease    Osteoarthritis    PONV (postoperative nausea and vomiting)    Rheumatoid arthritis ([place])    Stroke ([place])    a. 05/2014 pt c/o garbled speech following cath 5/19. MRI/A 5/26 showed left caudate infarct->conservative mgmt per neuro.   Thyroid cancer ([place])    a. s/p right sided  hemi-thyroidectomy; b. planning for radioactive iodine tx; c. followed by Dr. Marcelene Butte   Type 2 diabetes mellitus [facility])    Past Surgical History:  Procedure Laterality Date   ABDOMINAL HYSTERECTOMY     APPENDECTOMY     AUGMENTATION MAMMAPLASTY     BACK SURGERY     BREAST IMPLANT REMOVAL  06/2001   CARDIAC CATHETERIZATION N/A 06/06/2014   Procedure: Coronary/Graft Atherectomy;  Surgeon: Wellington Hampshire, MD;  Location: [place] [facility];  Service: Cardiovascular;  Laterality: N/A;   CARDIAC CATHETERIZATION N/A 05/31/2014   Procedure: Left Heart Cath;  Surgeon: Minna Merritts, MD;  Location: [place]  CV LAB;  Service: Cardiovascular;  Laterality: N/A;   CARDIAC CATHETERIZATION N/A 05/31/2014   Procedure: Coronary Stent Intervention;  Surgeon: Wellington Hampshire, MD;  Location: [place] [facility];  Service: Cardiovascular;  Laterality: N/A;   CARPAL TUNNEL RELEASE     "I've have a total of 7" (06/06/2014)   CATARACT EXTRACTION W/ INTRAOCULAR LENS  IMPLANT, BILATERAL Bilateral    CHOLECYSTECTOMY     CORONARY ANGIOPLASTY WITH STENT PLACEMENT  05/31/2014; 06/06/2014   "2; 1"   GANGLION CYST EXCISION Right    AC joint   LUMBAR LAMINECTOMY  X 6   MENISCUS REPAIR Bilateral    "2 on the right; 1 on the left"   ROTATOR CUFF REPAIR     "I think 2 left, 2 right"   THYROIDECTOMY     TONSILLECTOMY     TUBAL LIGATION      Social History:  reports that she has never smoked. She has never used smokeless tobacco. She reports that she does not drink alcohol and does not use drugs.   Allergies  Allergen Reactions   Ciprofloxacin Diarrhea and Nausea And Vomiting   Codeine Diarrhea and Nausea And Vomiting   Metronidazole Diarrhea, Nausea And Vomiting and Nausea Only   Other Hives and Other (See Comments)    Distress   Sulfa Antibiotics Other (See Comments) and Hives    Distress Distress   Neomycin Itching   Neomycin-Bacitracin Zn-Polymyx Other (See Comments)    Reaction:  Unknown  Reaction:  Unknown    Bacitracin-Polymyxin B Rash   Celecoxib Itching, Rash and Other (See Comments)   Petrolatum-Zinc Oxide Hives   Statins Other (See Comments)    Reaction:  Muscle pain Reaction:  Muscle pain Reaction:  Muscle pain   Vioxx [Rofecoxib] Itching and Rash    Family History  Problem Relation Age of Onset   Heart disease Sister        stent placed x 3       Prior to Admission medications   Medication Sig Start Date End Date Taking? Authorizing Provider  acetaminophen (TYLENOL) 500 MG tablet Take 500-1,000 mg by mouth every 6 (six) hours as needed for mild pain or moderate pain.   Yes  [provider]  allopurinol (ZYLOPRIM) 100 MG tablet TAKE 1 TABLET(100 MG) BY MOUTH TWICE DAILY 11/14/18  Yes Newt Minion, MD  clopidogrel (PLAVIX) 75 MG tablet TAKE 1 TABLET BY MOUTH  DAILY 08/19/20  Yes Gollan, Kathlene November, MD  Colchicine (MITIGARE) 0.6 MG CAPS Take 0.6 mg by mouth daily as needed. 03/05/20  Yes Minna Merritts, MD  furosemide (LASIX) 20 MG tablet Take 20 mg by mouth daily as needed for fluid. (Take with POTASSIUM)   Yes [provider]  levothyroxine (SYNTHROID) 88 MCG tablet TAKE 1 TABLET(88 MCG) BY MOUTH DAILY BEFORE BREAKFAST 08/12/20  Yes Cammie Sickle, MD  metFORMIN (GLUCOPHAGE) 500 MG tablet Take 500 mg by mouth 2 (two) times a day. 05/10/18  Yes [provider]  metoprolol succinate (TOPROL XL) 50 MG 24 hr tablet Take 1 tablet (50 mg total) by mouth daily. 05/13/15  Yes Gollan, Kathlene November, MD  Multiple Vitamins-Minerals (ICAPS AREDS 2 PO) Take 1 tablet by mouth as directed.   Yes [provider]  nitroGLYCERIN (NITROSTAT) 0.4 MG SL tablet Place 1 tablet (0.4 mg total) under the tongue every 5 (five) minutes as needed for chest pain. 08/19/20  Yes Gollan, Kathlene November, MD  potassium chloride (KLOR-CON) 10 MEQ tablet Take 10 mEq by mouth daily as needed (nutrient replenishment). (Take with FUROSEMIDE)   Yes [provider]  rosuvastatin (CRESTOR) 5 MG tablet TAKE 1 TABLET(5 MG) BY MOUTH DAILY. MAY 05/22/20  Yes Minna Merritts, MD    Physical Exam: BP 97/60   Pulse 87   Temp (!) 96.9 F (36.1 C) (Rectal)   Resp 17   Ht '5\' 3"'$  (1.6 m)   Wt 72.6 kg   SpO2 99%   BMI 28.34 kg/m   Exam:  General: 87 y.o. year-old female well developed well nourished in no acute distress.  Drowsy and mumbling to herself and making movements of her upper extremity Cardiovascular: Regular rate and rhythm with no rubs or gallops.  No thyromegaly or JVD noted.   Respiratory: Clear to auscultation with no wheezes or rales. Good inspiratory  effort. Abdomen: Soft nontender nondistended with normal bowel sounds x4 quadrants. Musculoskeletal: No lower extremity edema. 2/4 pulses in all 4 extremities. Skin: No ulcerative lesions noted or rashes, Psychiatry: Eyes are closed but she is mumbling finally asked her call her name she answered but eyes were still closed           Labs on Admission:  Basic Metabolic Panel: Recent Labs  Lab 08/25/20 1137  NA 137  K 4.7  CL 105  CO2 22  GLUCOSE 171*  BUN 28*  CREATININE 0.83  CALCIUM 9.1   Liver Function Tests: Recent Labs  Lab 08/25/20 1137  AST 34  ALT 14  ALKPHOS 68  BILITOT 0.8  PROT 7.1  ALBUMIN 4.1   Recent Labs  Lab 08/25/20 1139  LIPASE 46   No results for input(s): AMMONIA in the last 168 hours. CBC: Recent Labs  Lab 08/25/20 1137  WBC 9.1  NEUTROABS 7.7  HGB 12.0  HCT 37.1  MCV 93.2  PLT 243   Cardiac Enzymes: No results for input(s): CKTOTAL, CKMB, CKMBINDEX, TROPONINI in the last 168 hours.  BNP (last 3 results) No results for input(s): BNP in the last 8760 hours.  ProBNP (last 3 results) No results for input(s): PROBNP in the last 8760 hours.  CBG: No results for input(s): GLUCAP in the last 168 hours.  Radiological Exams on Admission: CT HEAD WO CONTRAST (5MM)  Result Date: 08/25/2020 CLINICAL DATA:  Mental status change, unknown cause EXAM: CT HEAD WITHOUT CONTRAST TECHNIQUE: Contiguous axial images were obtained from the base of the skull through the vertex without intravenous contrast. COMPARISON:  06/17/2020 FINDINGS: Brain: No evidence of acute infarction, hemorrhage, hydrocephalus, extra-axial collection or mass lesion/mass effect. Mild low-density changes within the periventricular and subcortical white matter compatible with chronic microvascular ischemic change. Mild diffuse cerebral volume loss. Vascular: Atherosclerotic calcifications involving the large vessels of the skull base. No unexpected hyperdense vessel. Skull:  Normal. Negative for fracture or focal lesion. Sinuses/Orbits: No  acute finding. Other: Erosive changes again noted at the C1-C2 articulation with pannus formation along the posterior aspect of the dens. This appears unchanged compared to the previous study. IMPRESSION: 1. No acute intracranial findings. 2. Chronic microvascular ischemic change and cerebral volume loss. 3. Chronic arthropathy again seen at C1-C2, likely inflammatory or crystalline. Electronically Signed   By: Davina Poke D.O.   On: 08/25/2020 13:06   DG Chest Port 1 View  Result Date: 08/25/2020 CLINICAL DATA:  Questionable sepsis - evaluate for abnormality. hypoxic. Altered mental status EXAM: PORTABLE CHEST 1 VIEW COMPARISON:  12/08/2017 FINDINGS: Lungs are clear. No pneumothorax or pleural effusion. Cardiac size within normal limits. Pulmonary vascularity is normal. Osseous structures are age-appropriate. No acute bone abnormality. IMPRESSION: No active disease. Electronically Signed   By: Fidela Salisbury M.D.   On: 08/25/2020 12:21    EKG: Independently reviewed.  EKG had sinus rhythm normal sinus rhythm no STEMI no ischemia  Assessment/Plan Present on Admission:  Essential hypertension  Principal Problem:   Seizure ([place]) Active Problems:   Essential hypertension   Diabetes mellitus ([place])  1.  New onset seizure patient had unwitnessed seizure at home and when EMS arrived she was probably in postictal state as he husband said he could not wake her up she was dead weight.  She had another seizure while she was in the ER. Neurology has been consulted they recommended EGD as well as MRI with contrast and to continue Keppra 50 mg twice a day.  2.  Type 2 diabetes mellitus blood sugar slightly elevated we will continue her on sliding scale  3.  Essential hypertension blood pressure is uncontrolled likely because the seizure activity.  She will continue on home medication  4.  Hyperlipidemia continue statin  5.  History  of coronary artery disease status post stent placement patient is on clopidogrel is stable she denies any chest pain  Severity of Illness: The appropriate patient status for this patient is INPATIENT. Inpatient status is judged to be reasonable and necessary in order to provide the required intensity of service to ensure the patient's safety. The patient's presenting symptoms, physical exam findings, and initial radiographic and laboratory data in the context of their chronic comorbidities is felt to place them at high risk for further clinical deterioration. Furthermore, it is not anticipated that the patient will be medically stable for discharge from the hospital within 2 midnights of admission. The following factors support the patient status of inpatient.   " New-onset seizure * I certify that at the point of admission it is my clinical judgment that the patient will require inpatient hospital care spanning beyond 2 midnights from the point of admission due to high intensity of service, high risk for further deterioration and high frequency of surveillance required.*   DVT prophylaxis: SCD  Code Status: Full  Family Communication: Husband Eddie at bedside along with her daughter Amy  Disposition Plan: Likely discharge home  Consults called: Neurology  Admission status: Inpatient    Cristal Deer MD [facility] Pager [phone]  If 7PM-7AM, please contact night-coverage [email or web address] Password [facility]  08/25/2020, 4:52 PM

## 2020-08-25 NOTE — ED Triage Notes (Signed)
Pt to ED for altered mental status. Did not eat well yesterday, vomited all night last night Seen at [facility] last week for adverse reaction to pain medication Unresponsive this morning, family called EMS. From home CBG 150s, VS normal per EMS Pt nods to questions. Not cooperating for oral temp. Usually walks, talks Hx frequent UTIs, UTI smell noted by EMS on pt bed which was found to be wet with urine Dr Tamala Julian at bedside. Oral T 98.4, will take rectal. Pt not responding to questions.

## 2020-08-25 NOTE — ED Notes (Signed)
Nasal cannula removed. 97% on RA.

## 2020-08-25 NOTE — ED Notes (Signed)
Daughter back at bedside. Husband at bedside. Dr Tamala Julian came and spoke with both about plan to consult with neurology.

## 2020-08-25 NOTE — ED Notes (Signed)
Pt to CT. Family at bedside.

## 2020-08-25 NOTE — ED Notes (Signed)
Pt very confused, attempting to get OOB. Pt vomited. Cleaned pt, changed gown. Secured IV with coban. Daughter and husband at bedside.

## 2020-08-25 NOTE — ED Notes (Signed)
Lab at bedside

## 2020-08-25 NOTE — ED Notes (Signed)
Contacted Dr Kyung Bacca to clarify order for second '4mg'$  zofran.

## 2020-08-25 NOTE — ED Notes (Signed)
Dr Tamala Julian at bedside. 2 family members at bedside. Pt now responding verbally. Does not remember having emesis last night or this morning.

## 2020-08-25 NOTE — ED Notes (Signed)
Neurologist at bedside. 

## 2020-08-25 NOTE — ED Notes (Signed)
Lab at bedside to draw second lactic.

## 2020-08-25 NOTE — ED Notes (Signed)
Wrote to Dr Kyung Bacca to request IV rather than Po ativan and clarify '4mg'$  zofran order as '4mg'$  already given.

## 2020-08-25 NOTE — ED Notes (Signed)
Oxygen removed by EDP. Pt now alert and SPO2 remains at 97% on RA.

## 2020-08-25 NOTE — ED Notes (Signed)
Provided warm blankets. Pt following commands. Np speech. Nods to questions. Xray at bedside. Labs sent.

## 2020-08-25 NOTE — ED Notes (Signed)
This nurse attempted to draw second lactic acid. IV does not draw back. Attempted IV placement 2 timers. Lab called to draw. Pt has very friable veins.  Pt then noted to have seizure with R sided tonic clonic jerking of R arm and eyes deviated to R, lasting about 60 seconds. Dr Tamala Julian called in. SPO2 noted to drop to 83% on RA. Placed on 15L nonrebreather. SPO2 now 100%.

## 2020-08-25 NOTE — ED Notes (Signed)
Pt changed back to 2L oxygen per [place].

## 2020-08-26 LAB — URINE CULTURE: Culture: NO GROWTH

## 2020-08-26 LAB — GLUCOSE, CAPILLARY
Glucose-Capillary: 105 mg/dL — ABNORMAL HIGH (ref 70–99)
Glucose-Capillary: 83 mg/dL (ref 70–99)
Glucose-Capillary: 110 mg/dL — ABNORMAL HIGH (ref 70–99)

## 2020-08-26 MED ORDER — INSULIN ASPART 100 UNIT/ML IJ SOLN: 0.0000 [IU] | Freq: Three times a day (TID) | INTRAMUSCULAR | Status: DC

## 2020-08-26 MED ORDER — MAGNESIUM SULFATE 2 GM/50ML IV SOLN
2.0000 g | Freq: Once | INTRAVENOUS | Status: AC
  Administered 2020-08-26: 2 g via INTRAVENOUS
  Filled 2020-08-26: qty 50

## 2020-08-26 MED ORDER — HYDRALAZINE HCL 50 MG PO TABS
25.0000 mg | ORAL_TABLET | Freq: Four times a day (QID) | ORAL | Status: DC | PRN
  Administered 2020-08-27: 07:00:00 25 mg via ORAL
  Filled 2020-08-26: qty 1

## 2020-08-26 NOTE — Progress Notes (Signed)
PROGRESS NOTE    [name]  KWI:[id number] DOB: December 03, 1933 DOA: 08/25/2020 PCP: Idelle Crouch, MD   Brief Narrative: Taken from H&P. [name] is a 87 y.o. female with medical history significant for coronary artery disease status post stent, carotid artery disease, hyperlipidemia, hypertension, hypothyroidism, rheumatoid arthritis, stroke in 2016, thyroid cancer status post hemithyroidectomy, type 2 diabetes mellitus, who presented to the emergency department via EMS because family noted she was exhibiting altered mental status.  Per daughter she vomited several times overnight.  They went out to eat and night before.  Patient also had some incontinence during that time.  She was hemodynamically stable on arrival to ED.  Had a witnessed tonic-clonic seizure.  There is an history of fall about a month ago and she was complaining of headache since then. Neurology was consulted and she was loaded with Keppra and also started on Keppra regularly by neurology.  CT head and MRI was without any acute abnormality and shows findings of chronic microvascular ischemia and generalized volume loss. She was also found to have significant lactic acidosis with lactic acid of 5.9 which has been resolved.  Patient was on metformin too. EEG was ordered by neurology-pending  Subjective: Patient was more alert when seen today.  Answering questions appropriately, she is stating that she is feeling better than yesterday.  Husband at bedside.  He thinks that she is getting closer to her baseline.  No prior history of any seizures and no witnessed seizure in the house.  Assessment & Plan:   Principal Problem:   Seizure [facility]) Active Problems:   Essential hypertension   Diabetes mellitus ([place])  New onset seizure.  Unwitnessed at home but there was a witnessed seizure while in ER. No significant abnormality noted on MRI.  No obvious source of infection as urine and blood cultures remain  negative.  No obvious electrolyte abnormalities Neurology is on board and they recommended EEG-pending -Continue with twice daily dose of Keppra -Continue with seizure precautions  Lactic acidosis.  Resolved.  Most likely secondary to seizures.  No obvious source of infection, blood and urine cultures remain negative.  Metformin might be playing a role as patient was on it at home.  Type 2 diabetes mellitus.  Patient was on metformin at home, -Hold metformin because of recent lactic acidosis. -Continue with SSI  Essential hypertension.  Blood pressure little elevated.  She was on metoprolol and Lasix at home -Continue with metoprolol -Continue with as needed hydralazine -Holding Lasix as she appears little dry  Hypothyroidism. -Continue with Synthroid  History of CAD. S/p PCI.  No chest pain. -Continue with Plavix and Crestor -Continue with as needed nitroglycerin   Objective: Vitals:   08/25/20 1800 08/25/20 2032 08/26/20 0553 08/26/20 0757  BP: (!) 175/55 (!) 165/68 (!) 173/63 (!) 150/55  Pulse: 89 89 82 78  Resp: _0 Temp:  99 F (37.2 C) 98.3 F (36.8 C) 97.8 F (36.6 C)  TempSrc:  Oral Oral   SpO2: 98% 98% 98% 100%  Weight:      Height:        Intake/Output Summary (Last 24 hours) at 08/26/2020 1546 Last data filed at 08/26/2020 1027 Gross per 24 hour  Intake 850.88 ml  Output 650 ml  Net 200.88 ml   Filed Weights   08/25/20 1137  Weight: 72.6 kg    Examination:  General exam: Lethargic elderly lady, appears calm and comfortable  Respiratory system: Clear to  auscultation. Respiratory effort normal. Cardiovascular system: S1 & S2 heard, RRR.  Gastrointestinal system: Soft, nontender, nondistended, bowel sounds positive. Central nervous system: Alert and oriented. No focal neurological deficits. Extremities: No edema, no cyanosis, pulses intact and symmetrical. Skin: Multiple ecchymosis and hyperpigmented lesions throughout the body. Psychiatry:  Judgement and insight appear normal.   DVT prophylaxis: SCDs Code Status: Full Family Communication: Husband was updated at bedside Disposition Plan:  Status is: Inpatient  Remains inpatient appropriate because:Inpatient level of care appropriate due to severity of illness  Dispo: The patient is from: Home              Anticipated d/c is to: Home              Patient currently is not medically stable to d/c.   Difficult to place patient No               Level of care: Med-Surg  All the records are reviewed and case discussed with Care Management/Social Worker. Management plans discussed with the patient, nursing and they are in agreement.  Consultants:  Neurology  Procedures:  Antimicrobials:   Data Reviewed: I have personally reviewed following labs and imaging studies  CBC: Recent Labs  Lab 08/25/20 1137  WBC 9.1  NEUTROABS 7.7  HGB 12.0  HCT 37.1  MCV 93.2  PLT 355   Basic Metabolic Panel: Recent Labs  Lab 08/25/20 1137 08/25/20 1730  NA 137  --   K 4.7  --   CL 105  --   CO2 22  --   GLUCOSE 171*  --   BUN 28*  --   CREATININE 0.83  --   CALCIUM 9.1  --   MG  --  1.8   GFR: Estimated Creatinine Clearance: 48.2 mL/min (by C-G formula based on SCr of 0.83 mg/dL). Liver Function Tests: Recent Labs  Lab 08/25/20 1137  AST 34  ALT 14  ALKPHOS 68  BILITOT 0.8  PROT 7.1  ALBUMIN 4.1   Recent Labs  Lab 08/25/20 1139  LIPASE 46   No results for input(s): AMMONIA in the last 168 hours. Coagulation Profile: Recent Labs  Lab 08/25/20 1137  INR 0.9   Cardiac Enzymes: No results for input(s): CKTOTAL, CKMB, CKMBINDEX, TROPONINI in the last 168 hours. BNP (last 3 results) No results for input(s): PROBNP in the last 8760 hours. HbA1C: No results for input(s): HGBA1C in the last 72 hours. CBG: Recent Labs  Lab 08/26/20 1229  GLUCAP 105*   Lipid Profile: No results for input(s): CHOL, HDL, LDLCALC, TRIG, CHOLHDL, LDLDIRECT in the last 72  hours. Thyroid Function Tests: No results for input(s): TSH, T4TOTAL, FREET4, T3FREE, THYROIDAB in the last 72 hours. Anemia Panel: No results for input(s): VITAMINB12, FOLATE, FERRITIN, TIBC, IRON, RETICCTPCT in the last 72 hours. Sepsis Labs: Recent Labs  Lab 08/25/20 1139 08/25/20 1417 08/25/20 1817 08/25/20 2057  LATICACIDVEN 4.3* 5.9* 1.9 1.5    Recent Results (from the past 240 hour(s))  Blood Culture (routine x 2)     Status: None (Preliminary result)   Collection Time: 08/25/20 11:38 AM   Specimen: BLOOD  Result Value Ref Range Status   Specimen Description BLOOD BLOOD LEFT FOREARM  Final   Special Requests   Final    BOTTLES DRAWN AEROBIC AND ANAEROBIC Blood Culture adequate volume   Culture   Final    NO GROWTH 1 DAY Performed at [facility], [address] [place], [place] [address]  Report Status PENDING  Incomplete  Urine Culture     Status: None   Collection Time: 08/25/20 11:40 AM   Specimen: In/Out Cath Urine  Result Value Ref Range Status   Specimen Description   Final    IN/OUT CATH URINE Performed at [facility], [address] [place], [place] [address]    Special Requests   Final    NONE Performed at [facility], [address] [place], [place] [address]    Culture   Final    NO GROWTH Performed at [facility], [place] [address], [place], [place] [address]    Report Status 08/26/2020 FINAL  Final  Resp Panel by RT-PCR (Flu A&B, Covid) Nasopharyngeal Swab     Status: None   Collection Time: 08/25/20 11:40 AM   Specimen: Nasopharyngeal Swab; Nasopharyngeal(NP) swabs in vial transport medium  Result Value Ref Range Status   SARS Coronavirus 2 by RT PCR NEGATIVE NEGATIVE Final    Comment: (NOTE) SARS-CoV-2 target nucleic acids are NOT DETECTED.  The SARS-CoV-2 RNA is generally detectable in upper respiratory specimens during the acute phase of infection. The lowest concentration of SARS-CoV-2 viral  copies this assay can detect is 138 copies/mL. A negative result does not preclude SARS-Cov-2 infection and should not be used as the sole basis for treatment or other patient management decisions. A negative result may occur with  improper specimen collection/handling, submission of specimen other than nasopharyngeal swab, presence of viral mutation(s) within the areas targeted by this assay, and inadequate number of viral copies(<138 copies/mL). A negative result must be combined with clinical observations, patient history, and epidemiological information. The expected result is Negative.  Fact Sheet for Patients:  EntrepreneurPulse.com.au  Fact Sheet for Healthcare Providers:  IncredibleEmployment.be  This test is no t yet approved or cleared by the [place] FDA and  has been authorized for detection and/or diagnosis of SARS-CoV-2 by FDA under an Emergency Use Authorization (EUA). This EUA will remain  in effect (meaning this test can be used) for the duration of the COVID-19 declaration under Section 564(b)(1) of the Act, 21 U.S.C.section 360bbb-3(b)(1), unless the authorization is terminated  or revoked sooner.       Influenza A by PCR NEGATIVE NEGATIVE Final   Influenza B by PCR NEGATIVE NEGATIVE Final    Comment: (NOTE) The Xpert Xpress SARS-CoV-2/FLU/RSV plus assay is intended as an aid in the diagnosis of influenza from Nasopharyngeal swab specimens and should not be used as a sole basis for treatment. Nasal washings and aspirates are unacceptable for Xpert Xpress SARS-CoV-2/FLU/RSV testing.  Fact Sheet for Patients: EntrepreneurPulse.com.au  Fact Sheet for Healthcare Providers: IncredibleEmployment.be  This test is not yet approved or cleared by the [place] FDA and has been authorized for detection and/or diagnosis of SARS-CoV-2 by FDA under an Emergency Use Authorization (EUA). This  EUA will remain in effect (meaning this test can be used) for the duration of the COVID-19 declaration under Section 564(b)(1) of the Act, 21 U.S.C. section 360bbb-3(b)(1), unless the authorization is terminated or revoked.  Performed at [facility], [address] [place], [place] [address]   Blood Culture (routine x 2)     Status: None (Preliminary result)   Collection Time: 08/25/20 11:43 AM   Specimen: BLOOD  Result Value Ref Range Status   Specimen Description BLOOD BLOOD RIGHT FOREARM  Final   Special Requests   Final    BOTTLES DRAWN AEROBIC AND ANAEROBIC Blood Culture results  may not be optimal due to an inadequate volume of blood received in culture bottles   Culture   Final    NO GROWTH 1 DAY Performed at [facility], [address] [place], [place] [address]    Report Status PENDING  Incomplete     Radiology Studies: CT HEAD WO CONTRAST (5MM)  Result Date: 08/25/2020 CLINICAL DATA:  Mental status change, unknown cause EXAM: CT HEAD WITHOUT CONTRAST TECHNIQUE: Contiguous axial images were obtained from the base of the skull through the vertex without intravenous contrast. COMPARISON:  06/17/2020 FINDINGS: Brain: No evidence of acute infarction, hemorrhage, hydrocephalus, extra-axial collection or mass lesion/mass effect. Mild low-density changes within the periventricular and subcortical white matter compatible with chronic microvascular ischemic change. Mild diffuse cerebral volume loss. Vascular: Atherosclerotic calcifications involving the large vessels of the skull base. No unexpected hyperdense vessel. Skull: Normal. Negative for fracture or focal lesion. Sinuses/Orbits: No acute finding. Other: Erosive changes again noted at the C1-C2 articulation with pannus formation along the posterior aspect of the dens. This appears unchanged compared to the previous study. IMPRESSION: 1. No acute intracranial findings. 2. Chronic microvascular ischemic change  and cerebral volume loss. 3. Chronic arthropathy again seen at C1-C2, likely inflammatory or crystalline. Electronically Signed   By: Davina Poke D.O.   On: 08/25/2020 13:06   MR BRAIN W WO CONTRAST  Result Date: 08/25/2020 CLINICAL DATA:  Seizure EXAM: MRI HEAD WITHOUT AND WITH CONTRAST TECHNIQUE: Multiplanar, multiecho pulse sequences of the brain and surrounding structures were obtained without and with intravenous contrast. CONTRAST:  73m GADAVIST GADOBUTROL 1 MMOL/ML IV SOLN COMPARISON:  07/23/2020 FINDINGS: Brain: No acute infarct, mass effect or extra-axial collection. No acute or chronic hemorrhage. Hyperintense T2-weighted signal is moderately widespread throughout the white matter. Generalized volume loss without a clear lobar predilection. The midline structures are normal. Postcontrast images are severely motion degraded. Vascular: Major flow voids are preserved. Skull and upper cervical spine: Normal calvarium and skull base. Visualized upper cervical spine and soft tissues are normal. Sinuses/Orbits:No paranasal sinus fluid levels or advanced mucosal thickening. No mastoid or middle ear effusion. Normal orbits. IMPRESSION: 1. No acute intracranial abnormality. 2. Findings of chronic microvascular ischemia and generalized volume loss. 3. Severely motion degraded postcontrast imaging. Electronically Signed   By: KUlyses JarredM.D.   On: 08/25/2020 23:13   DG Chest Port 1 View  Result Date: 08/25/2020 CLINICAL DATA:  Questionable sepsis - evaluate for abnormality. hypoxic. Altered mental status EXAM: PORTABLE CHEST 1 VIEW COMPARISON:  12/08/2017 FINDINGS: Lungs are clear. No pneumothorax or pleural effusion. Cardiac size within normal limits. Pulmonary vascularity is normal. Osseous structures are age-appropriate. No acute bone abnormality. IMPRESSION: No active disease. Electronically Signed   By: AFidela SalisburyM.D.   On: 08/25/2020 12:21    Scheduled Meds:  chlorhexidine gluconate  (MEDLINE KIT)  15 mL Mouth Rinse BID   enoxaparin (LOVENOX) injection  40 mg Subcutaneous Q24H   insulin aspart  0-9 Units Subcutaneous TID WC   levothyroxine  88 mcg Oral QAC breakfast   metoprolol succinate  50 mg Oral Daily   ondansetron (ZOFRAN) IV  4 mg Intravenous Once   rosuvastatin  5 mg Oral Daily   Continuous Infusions:  levETIRAcetam 500 mg (08/26/20 0446)     LOS: 1 day   Time spent: 45 minutes. More than 50% of the time was spent in counseling/coordination of care  SLorella Nimrod MD [facility]  If 7PM-7AM, please contact night-coverage [email or web address]  08/26/2020, 3:46 PM   This record has been created using Systems analyst. Errors have been sought and corrected,but may not always be located. Such creation errors do not reflect on the standard of care.

## 2020-08-26 NOTE — Procedures (Signed)
Routine EEG Report  [name] is a 87 y.o. female with a history of first-time seizure who is undergoing an EEG to evaluate for seizures.  Report: This EEG was acquired with electrodes placed according to the International 10-20 electrode system (including Fp1, Fp2, F3, F4, C3, C4, P3, P4, O1, O2, T3, T4, T5, T6, A1, A2, Fz, Cz, Pz). The following electrodes were missing or displaced: none.  The occipital dominant rhythm was 10-12 Hz with overriding beta frequencies. This activity is reactive to stimulation. Drowsiness was manifested by background fragmentation; deeper stages of sleep were identified by K complexes and sleep spindles. There was no focal slowing. There were no interictal epileptiform discharges. There were no electrographic seizures identified. There was no abnormal response to photic stimulation or hyperventilation.   Impression: This EEG was obtained while awake and asleep and is normal. Beta frequencies are secondary to medication effect.    Clinical Correlation: Normal EEGs, however, do not rule out epilepsy.  [name], MD [facility] [phone]  If 7pm- 7am, please page neurology on call as listed in [place].

## 2020-08-26 NOTE — Progress Notes (Signed)
Earlier in the shfit around 8/9pm -Patient was transferred to room 119 via hospital bed from the ED. Patient is alert but mildly lethargic  ( most likely from the ativan previously given prior to transfer) and opens eyes to voice. Tried to orient patient to the room and room equipment but is still very sleepy. Telemetry monitor  leads were applied and verified placement with CCMD.  Patient's daughter called and wanted patient to know that the patient's husband will be there in the morning. Notified patient.  Later in shift, patient was given 1x dose Ativan  given per order prior to going to MRI. Patient currently resting and will continue to monitor to end of shift.  Change of assignment during the middle of shift, report given to the next Staff RN.

## 2020-08-26 NOTE — Evaluation (Signed)
Physical Therapy Evaluation Patient Details Name: [name] MRN: [id number] DOB: 12-Jun-1933 Today's Date: 08/26/2020   History of Present Illness  87 y.o. female with medical history significant for coronary artery disease status post stent, carotid artery disease, hyperlipidemia, hypertension, hypothyroidism, rheumatoid arthritis, stroke in 2016, thyroid cancer status post hemithyroidectomy, type 2 diabetes mellitus, who presented to the emergency department via EMS because family noted she was exhibiting altered mental status.  Daughter is at bedside she stated that they are she was acting herself they went out to have dinner last nigh with no complaints was ambulating well.  She was stated to have vomited several times overnight and then this morning she was found unresponsive by family.  EMS was called and on arrival they noted that patient had wet her bed blood sugar was 150s and vital signs were normal.  Patient on arrival to the emergency department with mutes.  He denies any prior history of seizures but did say she had fallen in the parking lots about a about a month ago and has been complaining of headaches since the fall.   Clinical Impression  Pt seen for PT evaluation with pt asleep upon PT arrival but awakened & agreeable to tx. Pt requires MAX assist for bed mobility & sit<>stand & stand pivot transfer to recliner with RW. Pt demonstrates impaired balance, midline orientation, & righting reactions. Pt is able to ambulate in room with RW & min/mod assist. Pt is oriented but does demonstrate impaired awareness. Pt would benefit from STR upon d/c to maximize independence with functional mobility, reduce fall risk, & decrease caregiver burden prior to return home.     Follow Up Recommendations SNF;Supervision/Assistance - 24 hour    Equipment Recommendations  None recommended by PT (TBD in next venue)    Recommendations for Other Services       Precautions / Restrictions  Precautions Precautions: Fall Restrictions Weight Bearing Restrictions: No      Mobility  Bed Mobility Overal bed mobility: Needs Assistance Bed Mobility: Supine to Sit     Supine to sit: Max assist;HOB elevated (max multimodal cuing to sequence, assistance to upright trunk) Sit to supine: Mod assist        Transfers Overall transfer level: Needs assistance Equipment used: Rolling walker (2 wheeled) Transfers: Sit to/from [facility] Sit to Stand: Max assist Stand pivot transfers: Mod assist;Max assist       General transfer comment: facilitation for anterior weight shifting  Ambulation/Gait Ambulation/Gait assistance: Min assist;Mod assist Gait Distance (Feet): 10 Feet Assistive device: Rolling walker (2 wheeled) Gait Pattern/deviations: Decreased step length - left;Decreased step length - right;Decreased stride length;Staggering left;Staggering right Gait velocity: decreased   General Gait Details: inconsistent gait pattern, poor awareness of proper use of AD  Stairs            Wheelchair Mobility    Modified Rankin (Stroke Patients Only)       Balance Overall balance assessment: Needs assistance Sitting-balance support: Feet supported;Bilateral upper extremity supported Sitting balance-Leahy Scale: Zero Sitting balance - Comments: max assist for midline orientation, delayed righting reactions Postural control: Left lateral lean;Posterior lean Standing balance support: During functional activity;Bilateral upper extremity supported Standing balance-Leahy Scale: Poor Standing balance comment: BUE support on RW                             Pertinent Vitals/Pain Pain Assessment: No/denies pain Faces Pain Scale: Hurts little more Pain  Location: head Pain Descriptors / Indicators: Aching Pain Intervention(s): Limited activity within patient's tolerance;Monitored during session;Repositioned    Home Living Family/patient  expects to be discharged to:: Private residence Living Arrangements: Spouse/significant other Available Help at Discharge: Family Type of Home: House (condo) Home Access: Level entry     Home Layout: One level Home Equipment: Environmental consultant - 2 wheels;Walker - 4 wheels;Bedside commode;Shower seat      Prior Function Level of Independence: Independent         Comments: Pt is out in community several times each day with husband. Pt reports use of SPC and furniture walking at home and use of 4WW in community. Pt endorses at least 3 falls in last 6 months.     Hand Dominance   Dominant Hand: Right    Extremity/Trunk Assessment   Upper Extremity Assessment Upper Extremity Assessment: Generalized weakness    Lower Extremity Assessment Lower Extremity Assessment: Generalized weakness       Communication   Communication: No difficulties  Cognition Arousal/Alertness: Lethargic;Suspect due to medications Behavior During Therapy: [facility] for tasks assessed/performed Overall Cognitive Status: Impaired/Different from baseline                                 General Comments: Oriented to year & location.      General Comments      Exercises     Assessment/Plan    PT Assessment Patient needs continued PT services  PT Problem List Decreased strength;Decreased mobility;Decreased safety awareness;Decreased range of motion;Decreased activity tolerance;Decreased balance;Decreased knowledge of use of DME       PT Treatment Interventions DME instruction;Therapeutic activities;Modalities;Gait training;Patient/family education;Cognitive remediation;Therapeutic exercise;Balance training;Wheelchair mobility training;Functional mobility training;Neuromuscular re-education;Manual techniques    PT Goals (Current goals can be found in the Care Plan section)  Acute Rehab PT Goals Patient Stated Goal: I'm ready to go home PT Goal Formulation: With patient Time For Goal Achievement:  09/09/20 Potential to Achieve Goals: Good    Frequency Min 2X/week   Barriers to discharge Decreased caregiver support      Co-evaluation               AM-PAC PT "6 Clicks" Mobility  Outcome Measure Help needed turning from your back to your side while in a flat bed without using bedrails?: Total Help needed moving from lying on your back to sitting on the side of a flat bed without using bedrails?: Total Help needed moving to and from a bed to a chair (including a wheelchair)?: Total Help needed standing up from a chair using your arms (e.g., wheelchair or bedside chair)?: Total Help needed to walk in hospital room?: A Lot Help needed climbing 3-5 steps with a railing? : Total 6 Click Score: 7    End of Session Equipment Utilized During Treatment: Gait belt Activity Tolerance: Patient tolerated treatment well Patient left: in chair;with chair alarm set;with call bell/phone within reach;with nursing/sitter in room Nurse Communication: Mobility status PT Visit Diagnosis: Muscle weakness (generalized) (M62.81);Unsteadiness on feet (R26.81);Difficulty in walking, not elsewhere classified (R26.2)    Time: [id number] PT Time Calculation (min) (ACUTE ONLY): 16 min   Charges:   PT Evaluation $PT Eval Low Complexity: 1 Low PT Treatments $Therapeutic Activity: 8-22 mins        Lavone Nian, PT, DPT 08/26/20, 4:14 PM   Waunita Schooner 08/26/2020, 4:12 PM

## 2020-08-26 NOTE — Progress Notes (Signed)
Back in room 1C 119

## 2020-08-26 NOTE — Evaluation (Signed)
Occupational Therapy Evaluation Patient Details Name: [name] MRN: [id number] DOB: 1933/07/03 Today's Date: 08/26/2020    History of Present Illness 87 y.o. female with medical history significant for coronary artery disease status post stent, carotid artery disease, hyperlipidemia, hypertension, hypothyroidism, rheumatoid arthritis, stroke in 2016, thyroid cancer status post hemithyroidectomy, type 2 diabetes mellitus, who presented to the emergency department via EMS because family noted she was exhibiting altered mental status.  Daughter is at bedside she stated that they are she was acting herself they went out to have dinner last nigh with no complaints was ambulating well.  She was stated to have vomited several times overnight and then this morning she was found unresponsive by family.  EMS was called and on arrival they noted that patient had wet her bed blood sugar was 150s and vital signs were normal.  Patient on arrival to the emergency department with mutes.  He denies any prior history of seizures but did say she had fallen in the parking lots about a about a month ago and has been complaining of headaches since the fall.   Clinical Impression   Patient presenting with decreased I in self care, balance, functional mobility/transfers, endurance, and safety awareness. Patient reports living at home with husband and he is present on to confirm baseline PTA. Pt reports being "back to my baseline" but she has not been out of bed. Pt needing min A for static sitting balance and max A to stand and for balance while standing. Significant posterior bias in standing. She is unable to take a side steps safety and returns back to bed. Patient will benefit from acute OT to increase overall independence in the areas of ADLs, functional mobility, and safety awareness in order to safely discharge to next venue of care.    Follow Up Recommendations  SNF;Supervision/Assistance - 24 hour     Equipment Recommendations  Other (comment) (defer to next venue of care)       Precautions / Restrictions Precautions Precautions: Fall      Mobility Bed Mobility Overal bed mobility: Needs Assistance Bed Mobility: Supine to Sit;Sit to Supine     Supine to sit: Mod assist Sit to supine: Mod assist        Transfers Overall transfer level: Needs assistance Equipment used: Rolling walker (2 wheeled) Transfers: Sit to/from Stand Sit to Stand: Mod assist;Max assist         General transfer comment: unable to take side steps    Balance Overall balance assessment: Needs assistance Sitting-balance support: Feet supported;Bilateral upper extremity supported Sitting balance-Leahy Scale: Fair Sitting balance - Comments: min A sitting balance   Standing balance support: During functional activity Standing balance-Leahy Scale: Zero Standing balance comment: max A                           ADL either performed or assessed with clinical judgement   ADL Overall ADL's : Needs assistance/impaired     Grooming: Wash/dry hands;Wash/dry face;Sitting;Minimal assistance                               Functional mobility during ADLs: Maximal assistance General ADL Comments: standing with significant posterior bias requiring max A for standing balance and min A for static sitting balance.     Vision Patient Visual Report: No change from baseline  Pertinent Vitals/Pain Pain Assessment: Faces Faces Pain Scale: Hurts little more Pain Location: head Pain Descriptors / Indicators: Aching Pain Intervention(s): Limited activity within patient's tolerance;Monitored during session;Repositioned     Hand Dominance Right   Extremity/Trunk Assessment Upper Extremity Assessment Upper Extremity Assessment: Generalized weakness   Lower Extremity Assessment Lower Extremity Assessment: Generalized weakness       Communication  Communication Communication: No difficulties   Cognition Arousal/Alertness: Awake/alert Behavior During Therapy: WFL for tasks assessed/performed Overall Cognitive Status: Impaired/Different from baseline                                 General Comments: Pt correctly states location, name, and year but makes several attempts at "guess" the date and needing min - mod multimodal cuing for technique and safety awareness. Pt with limited insight.              Home Living Family/patient expects to be discharged to:: Private residence Living Arrangements: Spouse/significant other Available Help at Discharge: Family Type of Home: House (condo) Home Access: Level entry     Home Layout: One [place]: Environmental consultant - 2 wheels;Walker - 4 wheels;Bedside commode;Shower seat          Prior Functioning/Environment Level of Independence: Independent        Comments: Pt is out in community several times each day with husband. Pt reports use of SPC and furniture walking at home and use of 4WW in community. Pt endorses at least 3 falls in last 6 months.        OT Problem List: Decreased strength;Decreased coordination;Cardiopulmonary status limiting activity;Decreased cognition;Decreased activity tolerance;Decreased safety awareness;Impaired balance (sitting and/or standing);Decreased knowledge of use of DME or AE;Decreased knowledge of precautions      OT Treatment/Interventions: Self-care/ADL training;Modalities;Balance training;Therapeutic exercise;Therapeutic activities;Energy conservation;Cognitive remediation/compensation;DME and/or AE instruction;Patient/family education    OT Goals(Current goals can be found in the care plan section) Acute Rehab OT Goals Patient Stated Goal: I'm ready to go home OT Goal Formulation: With patient/family Time For Goal Achievement: 09/09/20 Potential to Achieve Goals: Good ADL Goals Pt Will Perform Grooming:  with supervision;sitting Pt Will Perform Lower Body Dressing: with min guard assist;sit to/from stand Pt Will Transfer to Toilet: with min guard assist;ambulating Pt Will Perform Toileting - Clothing Manipulation and hygiene: with min guard assist;sit to/from stand  OT Frequency: Min 2X/week 2x/wk    AM-PAC OT "6 Clicks" Daily Activity     Outcome Measure Help from another person eating meals?: None Help from another person taking care of personal grooming?: None Help from another person toileting, which includes using toliet, bedpan, or urinal?: A Lot Help from another person bathing (including washing, rinsing, drying)?: A Lot Help from another person to put on and taking off regular upper body clothing?: A Little Help from another person to put on and taking off regular lower body clothing?: A Lot 6 Click Score: 17   End of Session Nurse Communication: Mobility status  Activity Tolerance: Patient tolerated treatment well Patient left: in bed;with call bell/phone within reach;with bed alarm set;with family/visitor present  OT Visit Diagnosis: Unsteadiness on feet (R26.81);Repeated falls (R29.6);Muscle weakness (generalized) (M62.81);History of falling (Z91.81)                Time: [id number] OT Time Calculation (min): 26 min Charges:  OT General Charges $OT Visit: 1  Visit OT Evaluation $OT Eval Moderate Complexity: 1 Mod OT Treatments $Self Care/Home Management : 8-22 mins  Darleen Crocker, MS, OTR/L , CBIS ascom [phone]  08/26/20, 12:59 PM

## 2020-08-26 NOTE — Plan of Care (Signed)
Neurology Plan of Care  Visit attempted this afternoon for neurology follow up but patient was unable to be seen bc she was gone at EEG. MRI brain wwo showed no acute intracranial abnormalities although postcontrast scan was motion-degraded. No changes to plan from Dr. Yvetta Coder consult note yesterday. Will re-evaluate patient at bedside tmrw.   [name], MD [facility] [phone]  If 7pm- 7am, please page neurology on call as listed in [place].

## 2020-08-26 NOTE — Progress Notes (Signed)
Pt. Being transported to EEG per transport

## 2020-08-26 NOTE — Progress Notes (Signed)
Eeg done 

## 2020-08-27 LAB — GLUCOSE, CAPILLARY
Glucose-Capillary: 97 mg/dL (ref 70–99)
Glucose-Capillary: 116 mg/dL — ABNORMAL HIGH (ref 70–99)

## 2020-08-27 MED ORDER — LEVETIRACETAM 250 MG PO TABS: 250.0000 mg | ORAL_TABLET | Freq: Two times a day (BID) | ORAL | 1 refills | Status: AC

## 2020-08-27 NOTE — Discharge Summary (Signed)
Physician Discharge Summary  [name] [id number] DOB: 05-May-1933 DOA: 08/25/2020  PCP: Idelle Crouch, MD  Admit date: 08/25/2020 Discharge date: 08/27/2020  Admitted From: Home Disposition: Home  Recommendations for Outpatient Follow-up:  Follow up with PCP in 1-2 weeks Follow-up with neurology Please obtain BMP/CBC in one week Please follow up on the following pending results: None  Home Health: Yes Equipment/Devices: Rolling walker Discharge Condition: Stable CODE STATUS: Full Diet recommendation: Heart Healthy / Carb Modified   Brief/Interim Summary: [name] is a 87 y.o. female with medical history significant for coronary artery disease status post stent, carotid artery disease, hyperlipidemia, hypertension, hypothyroidism, rheumatoid arthritis, stroke in 2016, thyroid cancer status post hemithyroidectomy, type 2 diabetes mellitus, who presented to the emergency department via EMS because family noted she was exhibiting altered mental status.  Per daughter she vomited several times overnight.  They went out to eat and night before.  Patient also had some incontinence during that time.   She was hemodynamically stable on arrival to ED.  Had a witnessed tonic-clonic seizure.  There is an history of fall about a month ago and she was complaining of headache since then. Neurology was consulted and she was loaded with Keppra and also started on Keppra regularly by neurology.  CT head and MRI was without any acute abnormality and shows findings of chronic microvascular ischemia and generalized volume loss.  EEG was negative for any seizure-like activity.  Neurology decided to continue with Keppra and follow-up with outpatient neurology for further recommendations.  She was also found to have significant lactic acidosis with lactic acid peaked at 5.9 on admission.  Resolved quickly with IV fluid.  Might be due to seizure as no other obvious infection found.   Patient was also on metformin at home which she was taking it for a long time.  We held the metformin while in the hospital and she can restart on discharge but need to discussed with PCP who can monitor about lactic acidosis and can switch her antidiabetic medications if needed.  She will continue rest of her home medications and follow-up with her providers.  Discharge Diagnoses:  Principal Problem:   Seizure [facility]) Active Problems:   Essential hypertension   Diabetes mellitus [facility])   Discharge Instructions  Discharge Instructions     Diet - low sodium heart healthy   Complete by: As directed    Discharge instructions   Complete by: As directed    It was pleasure taking care of you. As we discussed your neurologist added seizure medications, please take it as directed and follow-up with neurology as an outpatient. Acid level in your body was high when you were admitted, that can be due to seizure.  You are also taking metformin which can also cause similar elevated levels.  You can resume metformin on discharge but discussed with your primary care provider to keep an eye on your levels or change your medications as needed. Keep your self well-hydrated and do not drive until your neurologist clears you.   Increase activity slowly   Complete by: As directed       Allergies as of 08/27/2020       Reactions   Ciprofloxacin Diarrhea, Nausea And Vomiting   Codeine Diarrhea, Nausea And Vomiting   Metronidazole Diarrhea, Nausea And Vomiting, Nausea Only   Other Hives, Other (See Comments)   Distress   Sulfa Antibiotics Other (See Comments), Hives   Distress Distress   Neomycin Itching  Neomycin-bacitracin Zn-polymyx Other (See Comments)   Reaction:  Unknown  Reaction:  Unknown    Bacitracin-polymyxin B Rash   Celecoxib Itching, Rash, Other (See Comments)   Petrolatum-zinc Oxide Hives   Statins Other (See Comments)   Reaction:  Muscle pain Reaction:  Muscle pain Reaction:   Muscle pain   Vioxx [rofecoxib] Itching, Rash        Medication List     TAKE these medications    acetaminophen 500 MG tablet Commonly known as: TYLENOL Take 500-1,000 mg by mouth every 6 (six) hours as needed for mild pain or moderate pain.   allopurinol 100 MG tablet Commonly known as: ZYLOPRIM TAKE 1 TABLET(100 MG) BY MOUTH TWICE DAILY   clopidogrel 75 MG tablet Commonly known as: PLAVIX TAKE 1 TABLET BY MOUTH  DAILY   Colchicine 0.6 MG Caps Commonly known as: Mitigare Take 0.6 mg by mouth daily as needed.   furosemide 20 MG tablet Commonly known as: LASIX Take 20 mg by mouth daily as needed for fluid. (Take with POTASSIUM)   ICAPS AREDS 2 PO Take 1 tablet by mouth as directed.   levETIRAcetam 250 MG tablet Commonly known as: KEPPRA Take 1 tablet (250 mg total) by mouth 2 (two) times daily.   levothyroxine 88 MCG tablet Commonly known as: SYNTHROID TAKE 1 TABLET(88 MCG) BY MOUTH DAILY BEFORE BREAKFAST   metFORMIN 500 MG tablet Commonly known as: GLUCOPHAGE Take 500 mg by mouth 2 (two) times a day.   metoprolol succinate 50 MG 24 hr tablet Commonly known as: Toprol XL Take 1 tablet (50 mg total) by mouth daily.   nitroGLYCERIN 0.4 MG SL tablet Commonly known as: NITROSTAT Place 1 tablet (0.4 mg total) under the tongue every 5 (five) minutes as needed for chest pain.   potassium chloride 10 MEQ tablet Commonly known as: KLOR-CON Take 10 mEq by mouth daily as needed (nutrient replenishment). (Take with FUROSEMIDE)   rosuvastatin 5 MG tablet Commonly known as: CRESTOR TAKE 1 TABLET(5 MG) BY MOUTH DAILY. MAY               Durable Medical Equipment  (From admission, onward)           Start     Ordered   08/27/20 1251  For home use only DME Walker rolling  Once       Question Answer Comment  Walker: With 5 Inch Wheels   Patient needs a walker to treat with the following condition Weakness generalized      08/27/20 1250             Follow-up Information     Idelle Crouch, MD. Go on 09/04/2020.   Specialty: Internal Medicine Why: @ 1:30  with R[facility] Mans information: [address] [place] [address] [phone]         Vladimir Crofts, MD. Schedule an appointment as soon as possible for a visit in 1 week(s).   Specialty: Neurology Why: Office closed at this time patient to make own follow up appt Contact information: [facility] [place] [address] [phone]         [facility] Follow up.   Why: [facility] will call you to schedule your first visit.  You should hear from them within the next 24-48 hours.               Allergies  Allergen Reactions   Ciprofloxacin Diarrhea  and Nausea And Vomiting   Codeine Diarrhea and Nausea And Vomiting   Metronidazole Diarrhea, Nausea And Vomiting and Nausea Only   Other Hives and Other (See Comments)    Distress   Sulfa Antibiotics Other (See Comments) and Hives    Distress Distress   Neomycin Itching   Neomycin-Bacitracin Zn-Polymyx Other (See Comments)    Reaction:  Unknown  Reaction:  Unknown    Bacitracin-Polymyxin B Rash   Celecoxib Itching, Rash and Other (See Comments)   Petrolatum-Zinc Oxide Hives   Statins Other (See Comments)    Reaction:  Muscle pain Reaction:  Muscle pain Reaction:  Muscle pain   Vioxx [Rofecoxib] Itching and Rash    Consultations: Neurology  Procedures/Studies: CT HEAD WO CONTRAST (5MM)  Result Date: 08/25/2020 CLINICAL DATA:  Mental status change, unknown cause EXAM: CT HEAD WITHOUT CONTRAST TECHNIQUE: Contiguous axial images were obtained from the base of the skull through the vertex without intravenous contrast. COMPARISON:  06/17/2020 FINDINGS: Brain: No evidence of acute infarction, hemorrhage, hydrocephalus, extra-axial collection or mass lesion/mass effect. Mild low-density changes within the periventricular and  subcortical white matter compatible with chronic microvascular ischemic change. Mild diffuse cerebral volume loss. Vascular: Atherosclerotic calcifications involving the large vessels of the skull base. No unexpected hyperdense vessel. Skull: Normal. Negative for fracture or focal lesion. Sinuses/Orbits: No acute finding. Other: Erosive changes again noted at the C1-C2 articulation with pannus formation along the posterior aspect of the dens. This appears unchanged compared to the previous study. IMPRESSION: 1. No acute intracranial findings. 2. Chronic microvascular ischemic change and cerebral volume loss. 3. Chronic arthropathy again seen at C1-C2, likely inflammatory or crystalline. Electronically Signed   By: Davina Poke D.O.   On: 08/25/2020 13:06   MR BRAIN W WO CONTRAST  Result Date: 08/25/2020 CLINICAL DATA:  Seizure EXAM: MRI HEAD WITHOUT AND WITH CONTRAST TECHNIQUE: Multiplanar, multiecho pulse sequences of the brain and surrounding structures were obtained without and with intravenous contrast. CONTRAST:  65m GADAVIST GADOBUTROL 1 MMOL/ML IV SOLN COMPARISON:  07/23/2020 FINDINGS: Brain: No acute infarct, mass effect or extra-axial collection. No acute or chronic hemorrhage. Hyperintense T2-weighted signal is moderately widespread throughout the white matter. Generalized volume loss without a clear lobar predilection. The midline structures are normal. Postcontrast images are severely motion degraded. Vascular: Major flow voids are preserved. Skull and upper cervical spine: Normal calvarium and skull base. Visualized upper cervical spine and soft tissues are normal. Sinuses/Orbits:No paranasal sinus fluid levels or advanced mucosal thickening. No mastoid or middle ear effusion. Normal orbits. IMPRESSION: 1. No acute intracranial abnormality. 2. Findings of chronic microvascular ischemia and generalized volume loss. 3. Severely motion degraded postcontrast imaging. Electronically Signed   By:  KUlyses JarredM.D.   On: 08/25/2020 23:13   DG Chest Port 1 View  Result Date: 08/25/2020 CLINICAL DATA:  Questionable sepsis - evaluate for abnormality. hypoxic. Altered mental status EXAM: PORTABLE CHEST 1 VIEW COMPARISON:  12/08/2017 FINDINGS: Lungs are clear. No pneumothorax or pleural effusion. Cardiac size within normal limits. Pulmonary vascularity is normal. Osseous structures are age-appropriate. No acute bone abnormality. IMPRESSION: No active disease. Electronically Signed   By: AFidela SalisburyM.D.   On: 08/25/2020 12:21   EEG adult  Result Date: 08/26/2020 SDerek Jack MD     08/26/2020 10:07 PM Routine EEG Report BDierdre Caltonis a 87y.o. female with a history of first-time seizure who is undergoing an EEG to evaluate for seizures. Report: This EEG was acquired with  electrodes placed according to the International 10-20 electrode system (including Fp1, Fp2, F3, F4, C3, C4, P3, P4, O1, O2, T3, T4, T5, T6, A1, A2, Fz, Cz, Pz). The following electrodes were missing or displaced: none. The occipital dominant rhythm was 10-12 Hz with overriding beta frequencies. This activity is reactive to stimulation. Drowsiness was manifested by background fragmentation; deeper stages of sleep were identified by K complexes and sleep spindles. There was no focal slowing. There were no interictal epileptiform discharges. There were no electrographic seizures identified. There was no abnormal response to photic stimulation or hyperventilation. Impression: This EEG was obtained while awake and asleep and is normal. Beta frequencies are secondary to medication effect.   Clinical Correlation: Normal EEGs, however, do not rule out epilepsy. [name], MD [facility] [phone] If 7pm- 7am, please page neurology on call as listed in [place].    Subjective: Patient was seen and examined today.  No new complaints.  Appears at her baseline and wants to go home.  Husband at bedside.  She does  not want to go to rehab.  Discharge Exam: Vitals:   08/27/20 0805 08/27/20 1143  BP: (!) 147/55 (!) 141/62  Pulse: 72 69  Resp: 17 16  Temp: 98 F (36.7 C) 98.5 F (36.9 C)  SpO2: 97% 98%   Vitals:   08/27/20 0041 08/27/20 0602 08/27/20 0805 08/27/20 1143  BP: (!) 182/55 (!) 183/69 (!) 147/55 (!) 141/62  Pulse: 67 74 72 69  Resp: '17 18 17 16  '$ Temp: 98 F (36.7 C) 98.4 F (36.9 C) 98 F (36.7 C) 98.5 F (36.9 C)  TempSrc:      SpO2: 95% 99% 97% 98%  Weight:      Height:        General: Pt is alert, awake, not in acute distress Cardiovascular: RRR, S1/S2 +, no rubs, no gallops Respiratory: CTA bilaterally, no wheezing, no rhonchi Abdominal: Soft, NT, ND, bowel sounds + Extremities: no edema, no cyanosis   The results of significant diagnostics from this hospitalization (including imaging, microbiology, ancillary and laboratory) are listed below for reference.    Microbiology: Recent Results (from the past 240 hour(s))  Blood Culture (routine x 2)     Status: None (Preliminary result)   Collection Time: 08/25/20 11:38 AM   Specimen: BLOOD  Result Value Ref Range Status   Specimen Description BLOOD BLOOD LEFT FOREARM  Final   Special Requests   Final    BOTTLES DRAWN AEROBIC AND ANAEROBIC Blood Culture adequate volume   Culture   Final    NO GROWTH 2 DAYS Performed at [facility], [address], [place], [place] [address]    Report Status PENDING  Incomplete  Urine Culture     Status: None   Collection Time: 08/25/20 11:40 AM   Specimen: In/Out Cath Urine  Result Value Ref Range Status   Specimen Description   Final    IN/OUT CATH URINE Performed at [facility], [address], [place], [place] [address]    Special Requests   Final    NONE Performed at [facility], [address], [place], [place] [address]    Culture   Final    NO GROWTH Performed at [facility], [place] [address] [place], [place] [address]     Report Status 08/26/2020 FINAL  Final  Resp Panel by RT-PCR (Flu A&B, Covid) Nasopharyngeal Swab     Status: None   Collection Time: 08/25/20 11:40 AM  Specimen: Nasopharyngeal Swab; Nasopharyngeal(NP) swabs in vial transport medium  Result Value Ref Range Status   SARS Coronavirus 2 by RT PCR NEGATIVE NEGATIVE Final    Comment: (NOTE) SARS-CoV-2 target nucleic acids are NOT DETECTED.  The SARS-CoV-2 RNA is generally detectable in upper respiratory specimens during the acute phase of infection. The lowest concentration of SARS-CoV-2 viral copies this assay can detect is 138 copies/mL. A negative result does not preclude SARS-Cov-2 infection and should not be used as the sole basis for treatment or other patient management decisions. A negative result may occur with  improper specimen collection/handling, submission of specimen other than nasopharyngeal swab, presence of viral mutation(s) within the areas targeted by this assay, and inadequate number of viral copies(<138 copies/mL). A negative result must be combined with clinical observations, patient history, and epidemiological information. The expected result is Negative.  Fact Sheet for Patients:  EntrepreneurPulse.com.au  Fact Sheet for Healthcare Providers:  IncredibleEmployment.be  This test is no t yet approved or cleared by the [place] FDA and  has been authorized for detection and/or diagnosis of SARS-CoV-2 by FDA under an Emergency Use Authorization (EUA). This EUA will remain  in effect (meaning this test can be used) for the duration of the COVID-19 declaration under Section 564(b)(1) of the Act, 21 U.S.C.section 360bbb-3(b)(1), unless the authorization is terminated  or revoked sooner.       Influenza A by PCR NEGATIVE NEGATIVE Final   Influenza B by PCR NEGATIVE NEGATIVE Final    Comment: (NOTE) The Xpert Xpress SARS-CoV-2/FLU/RSV plus assay is intended as an aid in  the diagnosis of influenza from Nasopharyngeal swab specimens and should not be used as a sole basis for treatment. Nasal washings and aspirates are unacceptable for Xpert Xpress SARS-CoV-2/FLU/RSV testing.  Fact Sheet for Patients: EntrepreneurPulse.com.au  Fact Sheet for Healthcare Providers: IncredibleEmployment.be  This test is not yet approved or cleared by the [place] FDA and has been authorized for detection and/or diagnosis of SARS-CoV-2 by FDA under an Emergency Use Authorization (EUA). This EUA will remain in effect (meaning this test can be used) for the duration of the COVID-19 declaration under Section 564(b)(1) of the Act, 21 U.S.C. section 360bbb-3(b)(1), unless the authorization is terminated or revoked.  Performed at [facility], [address] [place], [place] [address]   Blood Culture (routine x 2)     Status: None (Preliminary result)   Collection Time: 08/25/20 11:43 AM   Specimen: BLOOD  Result Value Ref Range Status   Specimen Description BLOOD BLOOD RIGHT FOREARM  Final   Special Requests   Final    BOTTLES DRAWN AEROBIC AND ANAEROBIC Blood Culture results may not be optimal due to an inadequate volume of blood received in culture bottles   Culture   Final    NO GROWTH 2 DAYS Performed at [facility], [address], [place], [place] [address]    Report Status PENDING  Incomplete     Labs: BNP (last 3 results) No results for input(s): BNP in the last 8760 hours. Basic Metabolic Panel: Recent Labs  Lab 08/25/20 1137 08/25/20 1730  NA 137  --   K 4.7  --   CL 105  --   CO2 22  --   GLUCOSE 171*  --   BUN 28*  --   CREATININE 0.83  --   CALCIUM 9.1  --   MG  --  1.8   Liver Function Tests: Recent Labs  Lab 08/25/20  1137  AST 34  ALT 14  ALKPHOS 68  BILITOT 0.8  PROT 7.1  ALBUMIN 4.1   Recent Labs  Lab 08/25/20 1139  LIPASE 46   No results for input(s): AMMONIA in  the last 168 hours. CBC: Recent Labs  Lab 08/25/20 1137  WBC 9.1  NEUTROABS 7.7  HGB 12.0  HCT 37.1  MCV 93.2  PLT 243   Cardiac Enzymes: No results for input(s): CKTOTAL, CKMB, CKMBINDEX, TROPONINI in the last 168 hours. BNP: Invalid input(s): POCBNP CBG: Recent Labs  Lab 08/26/20 1229 08/26/20 1757 08/26/20 2037 08/27/20 0803 08/27/20 1144  GLUCAP 105* 83 110* 97 116*   D-Dimer No results for input(s): DDIMER in the last 72 hours. Hgb A1c No results for input(s): HGBA1C in the last 72 hours. Lipid Profile No results for input(s): CHOL, HDL, LDLCALC, TRIG, CHOLHDL, LDLDIRECT in the last 72 hours. Thyroid function studies No results for input(s): TSH, T4TOTAL, T3FREE, THYROIDAB in the last 72 hours.  Invalid input(s): FREET3 Anemia work up No results for input(s): VITAMINB12, FOLATE, FERRITIN, TIBC, IRON, RETICCTPCT in the last 72 hours. Urinalysis    Component Value Date/Time   COLORURINE YELLOW (A) 08/25/2020 1140   APPEARANCEUR CLEAR (A) 08/25/2020 1140   APPEARANCEUR Hazy 10/31/2013 1407   LABSPEC 1.017 08/25/2020 1140   LABSPEC 1.011 10/31/2013 1407   PHURINE 5.0 08/25/2020 1140   GLUCOSEU NEGATIVE 08/25/2020 1140   GLUCOSEU Negative 10/31/2013 1407   HGBUR NEGATIVE 08/25/2020 1140   BILIRUBINUR NEGATIVE 08/25/2020 1140   BILIRUBINUR Negative 10/31/2013 1407   KETONESUR NEGATIVE 08/25/2020 1140   PROTEINUR 100 (A) 08/25/2020 1140   UROBILINOGEN 0.2 09/20/2009 1206   NITRITE NEGATIVE 08/25/2020 1140   LEUKOCYTESUR NEGATIVE 08/25/2020 1140   LEUKOCYTESUR Trace 10/31/2013 1407   Sepsis Labs Invalid input(s): PROCALCITONIN,  WBC,  LACTICIDVEN Microbiology Recent Results (from the past 240 hour(s))  Blood Culture (routine x 2)     Status: None (Preliminary result)   Collection Time: 08/25/20 11:38 AM   Specimen: BLOOD  Result Value Ref Range Status   Specimen Description BLOOD BLOOD LEFT FOREARM  Final   Special Requests   Final    BOTTLES DRAWN  AEROBIC AND ANAEROBIC Blood Culture adequate volume   Culture   Final    NO GROWTH 2 DAYS Performed at [facility], [address] [place], [place] [address]    Report Status PENDING  Incomplete  Urine Culture     Status: None   Collection Time: 08/25/20 11:40 AM   Specimen: In/Out Cath Urine  Result Value Ref Range Status   Specimen Description   Final    IN/OUT CATH URINE Performed at [facility], [address] [place], [place] [address]    Special Requests   Final    NONE Performed at [facility], [address] [place], [place] [address]    Culture   Final    NO GROWTH Performed at [facility], [place] [address] [place], [place] [address]    Report Status 08/26/2020 FINAL  Final  Resp Panel by RT-PCR (Flu A&B, Covid) Nasopharyngeal Swab     Status: None   Collection Time: 08/25/20 11:40 AM   Specimen: Nasopharyngeal Swab; Nasopharyngeal(NP) swabs in vial transport medium  Result Value Ref Range Status   SARS Coronavirus 2 by RT PCR NEGATIVE NEGATIVE Final    Comment: (NOTE) SARS-CoV-2 target nucleic acids are NOT DETECTED.  The SARS-CoV-2 RNA is generally detectable in upper respiratory  specimens during the acute phase of infection. The lowest concentration of SARS-CoV-2 viral copies this assay can detect is 138 copies/mL. A negative result does not preclude SARS-Cov-2 infection and should not be used as the sole basis for treatment or other patient management decisions. A negative result may occur with  improper specimen collection/handling, submission of specimen other than nasopharyngeal swab, presence of viral mutation(s) within the areas targeted by this assay, and inadequate number of viral copies(<138 copies/mL). A negative result must be combined with clinical observations, patient history, and epidemiological information. The expected result is Negative.  Fact Sheet for Patients:   EntrepreneurPulse.com.au  Fact Sheet for Healthcare Providers:  IncredibleEmployment.be  This test is no t yet approved or cleared by the [place] FDA and  has been authorized for detection and/or diagnosis of SARS-CoV-2 by FDA under an Emergency Use Authorization (EUA). This EUA will remain  in effect (meaning this test can be used) for the duration of the COVID-19 declaration under Section 564(b)(1) of the Act, 21 U.S.C.section 360bbb-3(b)(1), unless the authorization is terminated  or revoked sooner.       Influenza A by PCR NEGATIVE NEGATIVE Final   Influenza B by PCR NEGATIVE NEGATIVE Final    Comment: (NOTE) The Xpert Xpress SARS-CoV-2/FLU/RSV plus assay is intended as an aid in the diagnosis of influenza from Nasopharyngeal swab specimens and should not be used as a sole basis for treatment. Nasal washings and aspirates are unacceptable for Xpert Xpress SARS-CoV-2/FLU/RSV testing.  Fact Sheet for Patients: EntrepreneurPulse.com.au  Fact Sheet for Healthcare Providers: IncredibleEmployment.be  This test is not yet approved or cleared by the [place] FDA and has been authorized for detection and/or diagnosis of SARS-CoV-2 by FDA under an Emergency Use Authorization (EUA). This EUA will remain in effect (meaning this test can be used) for the duration of the COVID-19 declaration under Section 564(b)(1) of the Act, 21 U.S.C. section 360bbb-3(b)(1), unless the authorization is terminated or revoked.  Performed at [facility], [address] [place], [place] [address]   Blood Culture (routine x 2)     Status: None (Preliminary result)   Collection Time: 08/25/20 11:43 AM   Specimen: BLOOD  Result Value Ref Range Status   Specimen Description BLOOD BLOOD RIGHT FOREARM  Final   Special Requests   Final    BOTTLES DRAWN AEROBIC AND ANAEROBIC Blood Culture results may not be  optimal due to an inadequate volume of blood received in culture bottles   Culture   Final    NO GROWTH 2 DAYS Performed at [facility], [address], [place], [place] [address]    Report Status PENDING  Incomplete    Time coordinating discharge: Over 30 minutes  SIGNED:  Lorella Nimrod, MD  [facility] 08/27/2020, 3:24 PM  If 7PM-7AM, please contact night-coverage [email or web address]  This record has been created using Systems analyst. Errors have been sought and corrected,but may not always be located. Such creation errors do not reflect on the standard of care.

## 2020-08-27 NOTE — Progress Notes (Signed)
Physical Therapy Treatment Patient Details Name: [name] MRN: [id number] DOB: [birth year] Today's Date: 08/27/2020    History of Present Illness 87 y.o. female with medical history significant for coronary artery disease status post stent, carotid artery disease, hyperlipidemia, hypertension, hypothyroidism, rheumatoid arthritis, stroke in 2016, thyroid cancer status post hemithyroidectomy, type 2 diabetes mellitus, who presented to the emergency department via EMS because family noted she was exhibiting altered mental status.  Daughter is at bedside she stated that they are she was acting herself they went out to have dinner last nigh with no complaints was ambulating well.  She was stated to have vomited several times overnight and then this morning she was found unresponsive by family.  EMS was called and on arrival they noted that patient had wet her bed blood sugar was 150s and vital signs were normal.  Patient on arrival to the emergency department with mutes.  He denies any prior history of seizures but did say she had fallen in the parking lots about a about a month ago and has been complaining of headaches since the fall.    PT Comments    Pt to EOB with min guard.  Generally steady in sitting.  Stood with min a x 1 and post lean with cues.  Time given to obtain balance.  She is able to walk 20' x 2 with seated rest break.  Improved standing/balance from recliner but continued to need cues for hand placement.  She has decreased step length and height and overall poor gait quality.  Cues for safety and improved gait.    Pt stated she wants to go home today.  Husband concerned about SNF choices.  Discussed while gait is improved, she continues to need +1 assist at all times for safety.  Husband has physical deficits affecting balance.  He uses a SPC at baseline and a walker (which he no longer has as he gave it back to a family member and needs a new one himself).  He would not be  able to provide safe assistance for her. They would like to talk to [facility]  who will speak with them shortly.     Follow Up Recommendations  SNF  If they refuse, HHPT/OT would be beneficial.     Equipment Recommendations  Rolling walker with 5" wheels (TBD in next venue)    Recommendations for Other Services       Precautions / Restrictions Precautions Precautions: Fall Restrictions Weight Bearing Restrictions: No    Mobility  Bed Mobility Overal bed mobility: Needs Assistance Bed Mobility: Supine to Sit     Supine to sit: Min guard          Transfers Overall transfer level: Needs assistance Equipment used: Rolling walker (2 wheeled) Transfers: Sit to/from Stand Sit to Stand: Min assist            Ambulation/Gait Ambulation/Gait assistance: Herbalist (Feet): 20 Feet Assistive device: Rolling walker (2 wheeled) Gait Pattern/deviations: Decreased step length - left;Decreased step length - right;Decreased stride length;Staggering left;Staggering right Gait velocity: decreased   General Gait Details: cues for walker placement and posture and general safety   Stairs             Wheelchair Mobility    Modified Rankin (Stroke Patients Only)       Balance Overall balance assessment: Needs assistance Sitting-balance support: Feet supported Sitting balance-Leahy Scale: Good     Standing balance support: Bilateral upper extremity supported Standing balance-Leahy  Scale: Poor Standing balance comment: BUE support on RW                            Cognition Arousal/Alertness: Awake/alert Behavior During Therapy: WFL for tasks assessed/performed Overall Cognitive Status: Within Functional Limits for tasks assessed                                        Exercises      General Comments        Pertinent Vitals/Pain Pain Assessment: Faces Faces Pain Scale: Hurts little more Pain Location: head Pain  Descriptors / Indicators: Aching;Sore Pain Intervention(s): Limited activity within patient's tolerance;Monitored during session;Repositioned    Home Living                      Prior Function            PT Goals (current goals can now be found in the care plan section) Progress towards PT goals: Progressing toward goals    Frequency    Min 2X/week      PT Plan Current plan remains appropriate;Other (comment)    Co-evaluation              AM-PAC PT "6 Clicks" Mobility   Outcome Measure  Help needed turning from your back to your side while in a flat bed without using bedrails?: A Little Help needed moving from lying on your back to sitting on the side of a flat bed without using bedrails?: A Little Help needed moving to and from a bed to a chair (including a wheelchair)?: A Little Help needed standing up from a chair using your arms (e.g., wheelchair or bedside chair)?: A Little Help needed to walk in hospital room?: A Little Help needed climbing 3-5 steps with a railing? : A Lot 6 Click Score: 17    End of Session Equipment Utilized During Treatment: Gait belt Activity Tolerance: Patient tolerated treatment well Patient left: in chair;with chair alarm set;with call bell/phone within reach;with nursing/sitter in room Nurse Communication: Mobility status PT Visit Diagnosis: Muscle weakness (generalized) (M62.81);Unsteadiness on feet (R26.81);Difficulty in walking, not elsewhere classified (R26.2)     Time: [id number] PT Time Calculation (min) (ACUTE ONLY): 29 min  Charges:  $Gait Training: 8-22 mins $Therapeutic Activity: 8-22 mins                    Chesley Noon, PTA 08/27/20, 9:59 AM , 9:53 AM

## 2020-08-27 NOTE — Progress Notes (Signed)
Patient alert and oriented, BP elevated this morning, PRN hydralazine given, will continue to monitor.

## 2020-08-27 NOTE — TOC Transition Note (Signed)
Transition of Care [facility]) - CM/SW Discharge Note   Patient Details  Name: Nytasia Yong MRN: [id number] Date of Birth: 1933/06/25  Transition of Care [facility]) CM/SW Contact:  Shelbie Hutching, RN Phone Number: 08/27/2020, 2:25 PM   Clinical Narrative:     [facility] is not in network with the patient's [facility] Medicare so they are unable to accept.   Sarah at [facility] accepted referral for RN, PT, OT, and aide.  RW has been delivered to the room.  Final next level of care: [place] Barriers to Discharge: Barriers Resolved   Patient Goals and CMS Choice Patient states their goals for this hospitalization and ongoing recovery are:: To go home with her husand and have home health services. CMS Medicare.gov Compare Post Acute Care list provided to:: Patient Choice offered to / list presented to : Patient  Discharge Placement                       Discharge Plan and Services   Discharge Planning Services: CM Consult Post Acute Care Choice: Home Health          DME Arranged: Walker rolling DME Agency: [facility] Date DME Agency Contacted: 08/27/20 Time DME Agency Contacted: [id number] Representative spoke with at DME Agency: [place]: RN, PT, OT, Nurse's Aide [place] Agency: [facility] Date [place]: 08/27/20 Time [place]: 1420 Representative spoke with at [place]: [place] ([place]) Interventions     Readmission Risk Interventions No flowsheet data found.

## 2020-08-27 NOTE — TOC Transition Note (Signed)
Transition of Care [facility]) - CM/SW Discharge Note   Patient Details  Name: [name] MRN: [id number] Date of Birth: 07-Oct-1933  Transition of Care [facility]) CM/SW Contact:  Shelbie Hutching, RN Phone Number: 08/27/2020, 12:59 PM   Clinical Narrative:    Patient admitted to the hospital with seizure.  RNCM met with patient and patient's husband at the bedside.  Patient really wants to go home despite SNF recommendation from PT.  Patient agrees to home health services.  [place] referral given to [facility] with [facility] for RN, PT, OT, and aide.  Patient would like a rolling walker, she has a rollator at home.  Patient denies need for 3 in1.  Patient's husband will transport her home.  RW will be delivered to the room before she leaves.     Final next level of care: [place] Barriers to Discharge: Barriers Resolved   Patient Goals and CMS Choice Patient states their goals for this hospitalization and ongoing recovery are:: To go home with her husand and have home health services. CMS Medicare.gov Compare Post Acute Care list provided to:: Patient Choice offered to / list presented to : Patient  Discharge Placement                       Discharge Plan and Services   Discharge Planning Services: CM Consult Post Acute Care Choice: Home Health          DME Arranged: Walker rolling DME Agency: [facility] Date DME Agency Contacted: 08/27/20 Time DME Agency Contacted: [id number] Representative spoke with at DME Agency: [place]: RN, PT, OT, Nurse's Aide [place] Agency: [place] ([place]) Date [place]: 08/27/20 Time [place]: 1257 Representative spoke with at [place]: [place] ([place]) Interventions     Readmission Risk Interventions No flowsheet data found.

## 2020-08-30 LAB — CULTURE, BLOOD (ROUTINE X 2)
Culture: NO GROWTH
Culture: NO GROWTH
Special Requests: ADEQUATE

## 2020-11-07 ENCOUNTER — Other Ambulatory Visit: Payer: Self-pay | Admitting: Internal Medicine

## 2020-11-07 DIAGNOSIS — C73 Malignant neoplasm of thyroid gland: Secondary | ICD-10-CM

## 2021-01-02 ENCOUNTER — Telehealth: Payer: Self-pay | Admitting: Cardiovascular Disease

## 2021-01-02 NOTE — Telephone Encounter (Signed)
Received notes from Dr Doy Hutching requesting an appt with Dr Rockey Situ ASAP Called patient - VM full

## 2021-01-13 ENCOUNTER — Other Ambulatory Visit: Payer: Self-pay

## 2021-01-13 ENCOUNTER — Emergency Department: Payer: Medicare Other

## 2021-01-13 ENCOUNTER — Emergency Department
Admission: EM | Admit: 2021-01-13 | Discharge: 2021-01-14 | Disposition: A | Discharge status: Home / Self Care | Payer: Medicare Other | Attending: Emergency Medicine | Admitting: Emergency Medicine

## 2021-01-13 DIAGNOSIS — L539 Erythematous condition, unspecified: Secondary | ICD-10-CM | POA: Insufficient documentation

## 2021-01-13 DIAGNOSIS — M7989 Other specified soft tissue disorders: Secondary | ICD-10-CM | POA: Diagnosis not present

## 2021-01-13 DIAGNOSIS — Z5321 Procedure and treatment not carried out due to patient leaving prior to being seen by health care provider: Secondary | ICD-10-CM | POA: Insufficient documentation

## 2021-01-13 DIAGNOSIS — M79605 Pain in left leg: Secondary | ICD-10-CM | POA: Insufficient documentation

## 2021-01-13 DIAGNOSIS — L03116 Cellulitis of left lower limb: Secondary | ICD-10-CM | POA: Diagnosis not present

## 2021-01-13 LAB — CBC WITH DIFFERENTIAL/PLATELET
Abs Immature Granulocytes: 0.01 10*3/uL (ref 0.00–0.07)
Basophils Absolute: 0 10*3/uL (ref 0.0–0.1)
Basophils Relative: 0 %
Eosinophils Absolute: 0.4 10*3/uL (ref 0.0–0.5)
Eosinophils Relative: 5 %
HCT: 34.5 % — ABNORMAL LOW (ref 36.0–46.0)
Hemoglobin: 11.1 g/dL — ABNORMAL LOW (ref 12.0–15.0)
Immature Granulocytes: 0 %
Lymphocytes Relative: 17 %
Lymphs Abs: 1.2 10*3/uL (ref 0.7–4.0)
MCH: 30 pg (ref 26.0–34.0)
MCHC: 32.2 g/dL (ref 30.0–36.0)
MCV: 93.2 fL (ref 80.0–100.0)
Monocytes Absolute: 0.5 10*3/uL (ref 0.1–1.0)
Monocytes Relative: 7 %
Neutro Abs: 5.1 10*3/uL (ref 1.7–7.7)
Neutrophils Relative %: 71 %
Platelets: 261 10*3/uL (ref 150–400)
RBC: 3.7 MIL/uL — ABNORMAL LOW (ref 3.87–5.11)
RDW: 14 % (ref 11.5–15.5)
WBC: 7.2 10*3/uL (ref 4.0–10.5)
nRBC: 0 % (ref 0.0–0.2)

## 2021-01-13 LAB — LACTIC ACID, PLASMA: Lactic Acid, Venous: 1.5 mmol/L (ref 0.5–1.9)

## 2021-01-13 LAB — BASIC METABOLIC PANEL
Anion gap: 9 (ref 5–15)
BUN: 28 mg/dL — ABNORMAL HIGH (ref 8–23)
CO2: 25 mmol/L (ref 22–32)
Calcium: 9 mg/dL (ref 8.9–10.3)
Chloride: 107 mmol/L (ref 98–111)
Creatinine, Ser: 1.06 mg/dL — ABNORMAL HIGH (ref 0.44–1.00)
GFR, Estimated: 51 mL/min — ABNORMAL LOW (ref >60)
Glucose, Bld: 112 mg/dL — ABNORMAL HIGH (ref 70–99)
Potassium: 4.3 mmol/L (ref 3.5–5.1)
Sodium: 141 mmol/L (ref 135–145)

## 2021-01-13 NOTE — ED Triage Notes (Signed)
Pt to ED for wound to left lower leg after having lesion removed a couple weeks ago. Reports increased swelling and redness.  Diabetic.   Finished antibiotics yesterday

## 2021-01-13 NOTE — ED Provider Notes (Signed)
°  Emergency Medicine Provider Triage Evaluation Note  [name] , a 88 y.o.female,  was evaluated in triage.  Pt complains of left leg pain.  Patient states that she recently had a cancerous lesion removed a few weeks ago.  Since then, she has had progressive redness and swelling along the site of removal.  She was initially on cephalexin about a week ago.  She recently finished that, however her infection has not subsided.  Denies fever/chills, bleeding, discharge, chest pain, shortness of breath, or abdominal pain.   Review of Systems  Positive: Left leg pain Negative: Denies fever, chest pain, vomiting  Physical Exam   Vitals:   01/13/21 1831  BP: (!) 163/69  Pulse: 79  Resp: 18  Temp: 98.5 F (36.9 C)  SpO2: 96%   Gen:   Awake, no distress   Resp:  Normal effort  MSK:   Moves extremities without difficulty  Other:    Medical Decision Making  Given the patient's initial medical screening exam, the following diagnostic evaluation has been ordered. The patient will be placed in the appropriate treatment space, once one is available, to complete the evaluation and treatment. I have discussed the plan of care with the patient and I have advised the patient that an ED physician or mid-level practitioner will reevaluate their condition after the test results have been received, as the results may give them additional insight into the type of treatment they may need.    Diagnostics: Labs, x-ray  Treatments: none immediately   Teodoro Spray, [place] 01/13/21 Alveda Reasons, MD 01/14/21 [phone]

## 2021-01-14 ENCOUNTER — Other Ambulatory Visit: Payer: Self-pay

## 2021-01-14 ENCOUNTER — Emergency Department
Admission: EM | Admit: 2021-01-14 | Discharge: 2021-01-14 | Disposition: A | Discharge status: Home / Self Care | Payer: Medicare Other | Source: Home / Self Care | Attending: Emergency Medicine | Admitting: Emergency Medicine

## 2021-01-14 DIAGNOSIS — L03116 Cellulitis of left lower limb: Secondary | ICD-10-CM | POA: Insufficient documentation

## 2021-01-14 MED ORDER — DOXYCYCLINE HYCLATE 100 MG PO TABS: 100.0000 mg | ORAL_TABLET | Freq: Two times a day (BID) | ORAL | 0 refills | Status: DC

## 2021-01-14 NOTE — Discharge Instructions (Addendum)
-  Please take all your antibiotics as prescribed. -Return to the emergency department anytime if you begin to experience any new or worsening symptoms. -Follow-up with your dermatologist and primary care provider, as discussed.

## 2021-01-14 NOTE — ED Provider Notes (Signed)
[facility] Provider Note    Event Date/Time   First MD Initiated Contact with Patient 01/14/21 1624     (approximate)   History   Chief Complaint Wound Infection   HPI  [name] is a 88 y.o. female , history of CAD, hypertension, CVA, stroke, and seizures, presents the emergency department for evaluation of wound infection.  Patient states that a few weeks ago she had a cancerous lesion removed from her left leg by her dermatologist.  She was placed on a cephalexin treatment following this procedure.  She just recently finished this treatment 2 days ago, but has noticed increased warmth, tenderness, and swelling around the affected site.  Denies fever/chills, chest pain, shortness of breath, abdominal pain, back pain, claudication, urinary symptoms, or nausea/vomiting  Of note, patient originally presented to the emergency department on 01/13/2021.  She received an initial work-up which included labs, x-ray, and DVT ultrasound.  She left after triage prior to being seen.   History Limitations: No limitations.      Physical Exam  Triage Vital Signs: ED Triage Vitals [01/14/21 1626]  Enc Vitals Group     BP (!) 147/79     Pulse Rate 69     Resp 18     Temp 97.9 F (36.6 C)     Temp Source Oral     SpO2 100 %     Weight 130 lb 1.1 oz (59 kg)     Height 5\' 1"  (1.549 m)     Head Circumference      Peak Flow      Pain Score 7     Pain Loc      Pain Edu?      Excl. in [place]?     Most recent vital signs: Vitals:   01/14/21 1626  BP: (!) 147/79  Pulse: 69  Resp: 18  Temp: 97.9 F (36.6 C)  SpO2: 100%     Physical Exam Constitutional:      General: She is not in acute distress.    Appearance: Normal appearance. She is not ill-appearing.  Pulmonary:     Effort: Pulmonary effort is normal.  Abdominal:     General: Abdomen is flat.     Palpations: Abdomen is soft.     Tenderness: There is no abdominal tenderness.   Musculoskeletal:     Comments: Quarter sized wound appreciated on the medial aspect of the left lower extremity, along the distal tibia/fibular region.  Moderate erythema and tenderness surrounding the wound.  Clear drainage noted.  No active bleeding.  Skin:    General: Skin is warm and dry.     Capillary Refill: Capillary refill takes less than 2 seconds.  Neurological:     Mental Status: She is alert. Mental status is at baseline.      ED Results / Procedures / Treatments  Labs (all labs ordered are listed, but only abnormal results are displayed) Labs Reviewed - No data to display   EKG Not applicable.   RADIOLOGY I personally viewed and evaluated these images as part of my medical decision making, as well as reviewing the written report by the radiologist.  ED Provider Interpretation: I agree with the interpretations of the radiologist.  DG tibia/fibula shows no radiographic evidence of osteomyelitis.  Negative DVT ultrasound.   PROCEDURES:  Critical Care performed: No  Procedures    MEDICATIONS ORDERED IN ED: Medications - No data to display   IMPRESSION / MDM /  ASSESSMENT AND PLAN / ED COURSE  I reviewed the triage vital signs and the nursing notes.                              [name] is a 88 y.o. female history of CAD, hypertension, CVA, stroke, and seizures, presents the emergency department for evaluation of wound infection.  Patient states that a few weeks ago she had a cancerous lesion removed from her left leg by her dermatologist.  She was placed on a cephalexin treatment following this procedure.  She just recently finished this treatment 2 days ago, but has noticed increased warmth, tenderness, and swelling around the affected site.  Differential diagnosis includes, but is not limited to, osteomyelitis, cellulitis, DVT, postop inflammation  Patient appears well.  She is sitting upright comfortably in her chair.  Physical exam notable  for coin sized lesion on the medial aspect of her distal tibia/fibula region on the left lower extremity.  Clear drainage.  No active bleeding.  There is surrounding warmth, tenderness, and swelling.  X-ray is negative for findings of osteomyelitis.  Ultrasound shows no evidence of DVT.  CBC unremarkable for leukocytosis.  BMP negative  Given the patient's history, physical exam, and overall negative work-up, I suspect that the patient has a noncomplicated cellulitis surrounding her operative wound.  Given that the patient has already gone through a trial of cephalexin, will prescribe doxycycline.  We will plan to discharge this patient.  Advised her to make a follow-up appointment with her dermatologist.  Patient was provided with anticipatory guidance, return precautions, and educational material. Encouraged the patient to return to the emergency department at any time if they begin to experience any new or worsening symptoms.       FINAL CLINICAL IMPRESSION(S) / ED DIAGNOSES   Final diagnoses:  Cellulitis of left lower extremity     Rx / DC Orders   ED Discharge Orders          Ordered    doxycycline (VIBRA-TABS) 100 MG tablet  2 times daily        01/14/21 1641             Note:  This document was prepared using Dragon voice recognition software and may include unintentional dictation errors.   Teodoro Spray, [place] 01/14/21 2022    Arta Silence, MD 01/14/21 2103

## 2021-01-14 NOTE — ED Triage Notes (Signed)
See triage note from yesterday. Pt here for wound check to left lower leg. LWBS this am d/t wait.  Denies changes.

## 2021-01-19 ENCOUNTER — Encounter: Payer: Self-pay | Admitting: Emergency Medicine

## 2021-01-19 ENCOUNTER — Other Ambulatory Visit: Payer: Self-pay

## 2021-01-19 ENCOUNTER — Emergency Department
Admission: EM | Admit: 2021-01-19 | Discharge: 2021-01-19 | Disposition: A | Discharge status: Home / Self Care | Payer: Medicare Other | Attending: Emergency Medicine | Admitting: Emergency Medicine

## 2021-01-19 DIAGNOSIS — Z5189 Encounter for other specified aftercare: Secondary | ICD-10-CM

## 2021-01-19 DIAGNOSIS — Z4801 Encounter for change or removal of surgical wound dressing: Secondary | ICD-10-CM | POA: Diagnosis not present

## 2021-01-19 DIAGNOSIS — R2242 Localized swelling, mass and lump, left lower limb: Secondary | ICD-10-CM | POA: Diagnosis present

## 2021-01-19 LAB — CBC WITH DIFFERENTIAL/PLATELET
Abs Immature Granulocytes: 0.03 10*3/uL (ref 0.00–0.07)
Basophils Absolute: 0 10*3/uL (ref 0.0–0.1)
Basophils Relative: 0 %
Eosinophils Absolute: 0.2 10*3/uL (ref 0.0–0.5)
Eosinophils Relative: 3 %
HCT: 36.5 % (ref 36.0–46.0)
Hemoglobin: 11.8 g/dL — ABNORMAL LOW (ref 12.0–15.0)
Immature Granulocytes: 1 %
Lymphocytes Relative: 15 %
Lymphs Abs: 1 10*3/uL (ref 0.7–4.0)
MCH: 29.6 pg (ref 26.0–34.0)
MCHC: 32.3 g/dL (ref 30.0–36.0)
MCV: 91.5 fL (ref 80.0–100.0)
Monocytes Absolute: 0.5 10*3/uL (ref 0.1–1.0)
Monocytes Relative: 7 %
Neutro Abs: 4.8 10*3/uL (ref 1.7–7.7)
Neutrophils Relative %: 74 %
Platelets: 248 10*3/uL (ref 150–400)
RBC: 3.99 MIL/uL (ref 3.87–5.11)
RDW: 13.7 % (ref 11.5–15.5)
WBC: 6.5 10*3/uL (ref 4.0–10.5)
nRBC: 0 % (ref 0.0–0.2)

## 2021-01-19 LAB — COMPREHENSIVE METABOLIC PANEL
ALT: 13 U/L (ref 0–44)
AST: 26 U/L (ref 15–41)
Albumin: 4 g/dL (ref 3.5–5.0)
Alkaline Phosphatase: 58 U/L (ref 38–126)
Anion gap: 10 (ref 5–15)
BUN: 26 mg/dL — ABNORMAL HIGH (ref 8–23)
CO2: 25 mmol/L (ref 22–32)
Calcium: 9.2 mg/dL (ref 8.9–10.3)
Chloride: 105 mmol/L (ref 98–111)
Creatinine, Ser: 0.95 mg/dL (ref 0.44–1.00)
GFR, Estimated: 59 mL/min — ABNORMAL LOW (ref >60)
Glucose, Bld: 121 mg/dL — ABNORMAL HIGH (ref 70–99)
Potassium: 3.9 mmol/L (ref 3.5–5.1)
Sodium: 140 mmol/L (ref 135–145)
Total Bilirubin: 0.8 mg/dL (ref 0.3–1.2)
Total Protein: 6.7 g/dL (ref 6.5–8.1)

## 2021-01-19 LAB — LACTIC ACID, PLASMA: Lactic Acid, Venous: 1.3 mmol/L (ref 0.5–1.9)

## 2021-01-19 MED ORDER — DOXYCYCLINE HYCLATE 100 MG PO TABS: 100.0000 mg | ORAL_TABLET | Freq: Two times a day (BID) | ORAL | 0 refills | Status: DC

## 2021-01-19 NOTE — ED Provider Notes (Signed)
Emergency Medicine Provider Triage Evaluation Note  [name] , a 88 y.o. female  was evaluated in triage.  Pt complains of cellulitis of the left leg that is worsening despite outpatient treatment with doxycycline and Keflex.  Patient has had chills at home.  Patient has cellulitis of the left lower extremity.  Review of Systems  Positive: Patient has cellulitis.  Negative: No chest pain, chest tightness or shortness of breath.   Physical Exam  BP (!) 151/59 (BP Location: Left Arm)    Pulse 73    Temp 97.8 F (36.6 C) (Oral)    Resp 18    Ht 5\' 1"  (1.549 m)    Wt 59 kg    SpO2 99%    BMI 24.58 kg/m  Gen:   Awake, no distress   Resp:  Normal effort  MSK:   Moves extremities without difficulty  Other:  Patient has erythema on the left lower extremity.  Medical Decision Making  Medically screening exam initiated at 3:36 PM.  Appropriate orders placed.  [place] was informed that the remainder of the evaluation will be completed by another provider, this initial triage assessment does not replace that evaluation, and the importance of remaining in the ED until their evaluation is complete.     Vallarie Mare [place], PA-C 01/19/21 1537    Harvest Dark, MD 01/20/21 [phone]

## 2021-01-19 NOTE — ED Triage Notes (Signed)
Pt to ED via POV. Pt has a wound on her left lower leg that is not healing. Pt has been seen in the ED recently for the same. Pt is in NAD.

## 2021-01-19 NOTE — ED Provider Notes (Signed)
[facility] Provider Note    Event Date/Time   First MD Initiated Contact with Patient 01/19/21 1546     (approximate)   History   Wound Check   HPI  [name] is a 88 y.o. female who presents for a wound check.  Patient reports she had a skin lesion removed on her lower left leg and has had problems with healing.  She is mostly on antibiotics but she seems to have misplaced them.  She is concerned because of some yellowish discoloration of the wound.  Denies fevers or chills.     Physical Exam   Triage Vital Signs: ED Triage Vitals  Enc Vitals Group     BP 01/19/21 1527 (!) 151/59     Pulse Rate 01/19/21 1527 72     Resp 01/19/21 1527 18     Temp 01/19/21 1527 97.8 F (36.6 C)     Temp Source 01/19/21 1527 Oral     SpO2 01/19/21 1528 99 %     Weight 01/19/21 1534 59 kg (130 lb 1.1 oz)     Height 01/19/21 1534 1.549 m (5\' 1" )     Head Circumference --      Peak Flow --      Pain Score 01/19/21 1533 5     Pain Loc --      Pain Edu? --      Excl. in [place]? --     Most recent vital signs: Vitals:   01/19/21 1527 01/19/21 1528  BP: (!) 151/59 (!) 151/59  Pulse: 72 73  Resp: 18 18  Temp: 97.8 F (36.6 C) 97.8 F (36.6 C)  SpO2:  99%     General: Awake, no distress.  CV:  Good peripheral perfusion.  Resp:  Normal effort.  Abd:  No distention.  Other:  Skin: Left lower leg small circular approximately 2 cm in diameter lesion with granulation tissue, small amount of erythema surrounding overall well-appearing   ED Results / Procedures / Treatments   Labs (all labs ordered are listed, but only abnormal results are displayed) Labs Reviewed  CBC WITH DIFFERENTIAL/PLATELET - Abnormal; Notable for the following components:      Result Value   Hemoglobin 11.8 (*)    All other components within normal limits  COMPREHENSIVE METABOLIC PANEL - Abnormal; Notable for the following components:   Glucose, Bld 121 (*)    BUN 26 (*)     GFR, Estimated 59 (*)    All other components within normal limits  LACTIC ACID, PLASMA  LACTIC ACID, PLASMA  URINALYSIS, COMPLETE (UACMP) WITH MICROSCOPIC     EKG     RADIOLOGY     PROCEDURES:  Critical Care performed:   Procedures   MEDICATIONS ORDERED IN ED: Medications - No data to display   IMPRESSION / MDM / [place] / ED COURSE  I reviewed the triage vital signs and the nursing notes.   Patient's wound is actually overall reassuring, healthy granulation tissue noted, small amount of erythema around could be normal healing versus early cellulitis.  The patient has misplaced her doxycycline, will refill this for her.  Lab work reviewed, normal white blood cell count, normal CMP, normal lactic acid  I will refer her to the wound care center, Rx for doxycycline          FINAL CLINICAL IMPRESSION(S) / ED DIAGNOSES   Final diagnoses:  Visit for wound check     Rx /  DC Orders   ED Discharge Orders          Ordered    doxycycline (VIBRA-TABS) 100 MG tablet  2 times daily        01/19/21 1618             Note:  This document was prepared using Dragon voice recognition software and may include unintentional dictation errors.   Lavonia Drafts, MD 01/19/21 [phone]

## 2021-01-22 ENCOUNTER — Other Ambulatory Visit: Payer: Self-pay | Admitting: Cardiovascular Disease

## 2021-02-10 ENCOUNTER — Other Ambulatory Visit: Payer: Self-pay

## 2021-02-10 ENCOUNTER — Encounter: Payer: Self-pay | Admitting: Emergency Medicine

## 2021-02-10 DIAGNOSIS — I6529 Occlusion and stenosis of unspecified carotid artery: Secondary | ICD-10-CM | POA: Diagnosis present

## 2021-02-10 DIAGNOSIS — I5032 Chronic diastolic (congestive) heart failure: Secondary | ICD-10-CM | POA: Diagnosis present

## 2021-02-10 DIAGNOSIS — Z90710 Acquired absence of both cervix and uterus: Secondary | ICD-10-CM

## 2021-02-10 DIAGNOSIS — Z79899 Other long term (current) drug therapy: Secondary | ICD-10-CM

## 2021-02-10 DIAGNOSIS — S12120A Other displaced dens fracture, initial encounter for closed fracture: Secondary | ICD-10-CM | POA: Diagnosis not present

## 2021-02-10 DIAGNOSIS — E119 Type 2 diabetes mellitus without complications: Secondary | ICD-10-CM | POA: Diagnosis present

## 2021-02-10 DIAGNOSIS — R531 Weakness: Secondary | ICD-10-CM | POA: Diagnosis present

## 2021-02-10 DIAGNOSIS — Z23 Encounter for immunization: Secondary | ICD-10-CM

## 2021-02-10 DIAGNOSIS — M069 Rheumatoid arthritis, unspecified: Secondary | ICD-10-CM | POA: Diagnosis present

## 2021-02-10 DIAGNOSIS — M25552 Pain in left hip: Secondary | ICD-10-CM | POA: Diagnosis present

## 2021-02-10 DIAGNOSIS — S51012A Laceration without foreign body of left elbow, initial encounter: Secondary | ICD-10-CM | POA: Diagnosis present

## 2021-02-10 DIAGNOSIS — Z888 Allergy status to other drugs, medicaments and biological substances status: Secondary | ICD-10-CM

## 2021-02-10 DIAGNOSIS — Z79890 Hormone replacement therapy (postmenopausal): Secondary | ICD-10-CM

## 2021-02-10 DIAGNOSIS — Z86010 Personal history of colonic polyps: Secondary | ICD-10-CM

## 2021-02-10 DIAGNOSIS — Z7984 Long term (current) use of oral hypoglycemic drugs: Secondary | ICD-10-CM

## 2021-02-10 DIAGNOSIS — F419 Anxiety disorder, unspecified: Secondary | ICD-10-CM | POA: Diagnosis present

## 2021-02-10 DIAGNOSIS — Z7902 Long term (current) use of antithrombotics/antiplatelets: Secondary | ICD-10-CM

## 2021-02-10 DIAGNOSIS — R569 Unspecified convulsions: Secondary | ICD-10-CM | POA: Diagnosis present

## 2021-02-10 DIAGNOSIS — Z20822 Contact with and (suspected) exposure to covid-19: Secondary | ICD-10-CM | POA: Diagnosis present

## 2021-02-10 DIAGNOSIS — M1990 Unspecified osteoarthritis, unspecified site: Secondary | ICD-10-CM | POA: Diagnosis present

## 2021-02-10 DIAGNOSIS — F05 Delirium due to known physiological condition: Secondary | ICD-10-CM | POA: Diagnosis not present

## 2021-02-10 DIAGNOSIS — I110 Hypertensive heart disease with heart failure: Secondary | ICD-10-CM | POA: Diagnosis present

## 2021-02-10 DIAGNOSIS — Y92018 Other place in single-family (private) house as the place of occurrence of the external cause: Secondary | ICD-10-CM

## 2021-02-10 DIAGNOSIS — Z85828 Personal history of other malignant neoplasm of skin: Secondary | ICD-10-CM

## 2021-02-10 DIAGNOSIS — Z882 Allergy status to sulfonamides status: Secondary | ICD-10-CM

## 2021-02-10 DIAGNOSIS — S81812A Laceration without foreign body, left lower leg, initial encounter: Secondary | ICD-10-CM | POA: Diagnosis present

## 2021-02-10 DIAGNOSIS — R2681 Unsteadiness on feet: Secondary | ICD-10-CM | POA: Diagnosis present

## 2021-02-10 DIAGNOSIS — Z881 Allergy status to other antibiotic agents status: Secondary | ICD-10-CM

## 2021-02-10 DIAGNOSIS — E785 Hyperlipidemia, unspecified: Secondary | ICD-10-CM | POA: Diagnosis present

## 2021-02-10 DIAGNOSIS — E890 Postprocedural hypothyroidism: Secondary | ICD-10-CM | POA: Diagnosis present

## 2021-02-10 DIAGNOSIS — R296 Repeated falls: Secondary | ICD-10-CM | POA: Diagnosis present

## 2021-02-10 DIAGNOSIS — W010XXA Fall on same level from slipping, tripping and stumbling without subsequent striking against object, initial encounter: Secondary | ICD-10-CM | POA: Diagnosis present

## 2021-02-10 DIAGNOSIS — I2510 Atherosclerotic heart disease of native coronary artery without angina pectoris: Secondary | ICD-10-CM | POA: Diagnosis present

## 2021-02-10 DIAGNOSIS — K219 Gastro-esophageal reflux disease without esophagitis: Secondary | ICD-10-CM | POA: Diagnosis present

## 2021-02-10 DIAGNOSIS — M1009 Idiopathic gout, multiple sites: Secondary | ICD-10-CM | POA: Diagnosis present

## 2021-02-10 DIAGNOSIS — Z9181 History of falling: Secondary | ICD-10-CM

## 2021-02-10 DIAGNOSIS — Z85850 Personal history of malignant neoplasm of thyroid: Secondary | ICD-10-CM

## 2021-02-10 DIAGNOSIS — Z8673 Personal history of transient ischemic attack (TIA), and cerebral infarction without residual deficits: Secondary | ICD-10-CM

## 2021-02-10 DIAGNOSIS — Z885 Allergy status to narcotic agent status: Secondary | ICD-10-CM

## 2021-02-10 DIAGNOSIS — Z8249 Family history of ischemic heart disease and other diseases of the circulatory system: Secondary | ICD-10-CM

## 2021-02-10 DIAGNOSIS — Z886 Allergy status to analgesic agent status: Secondary | ICD-10-CM

## 2021-02-10 NOTE — ED Triage Notes (Addendum)
Pt to ED via [facility] with c/o a mechanical fall in the living room with her walker over junk that was on the floor. Pt is on a blood thinner (Plavix), Denies hitting head or LOC. She does have a skin tear on her left lower leg and left elbow, bleeding is under control. Denies any pain.

## 2021-02-11 ENCOUNTER — Encounter: Payer: Self-pay | Admitting: Internal Medicine

## 2021-02-11 ENCOUNTER — Emergency Department: Payer: Medicare Other

## 2021-02-11 ENCOUNTER — Inpatient Hospital Stay
Admission: EM | Admit: 2021-02-11 | Discharge: 2021-02-14 | DRG: 552 | Disposition: A | Discharge status: Skilled Nursing Facility | Payer: Medicare Other | Attending: Internal Medicine | Admitting: Internal Medicine

## 2021-02-11 DIAGNOSIS — S81812A Laceration without foreign body, left lower leg, initial encounter: Secondary | ICD-10-CM

## 2021-02-11 DIAGNOSIS — R569 Unspecified convulsions: Secondary | ICD-10-CM

## 2021-02-11 DIAGNOSIS — E119 Type 2 diabetes mellitus without complications: Secondary | ICD-10-CM | POA: Diagnosis not present

## 2021-02-11 DIAGNOSIS — W19XXXA Unspecified fall, initial encounter: Secondary | ICD-10-CM

## 2021-02-11 DIAGNOSIS — E785 Hyperlipidemia, unspecified: Secondary | ICD-10-CM

## 2021-02-11 DIAGNOSIS — S51012A Laceration without foreign body of left elbow, initial encounter: Secondary | ICD-10-CM

## 2021-02-11 DIAGNOSIS — I10 Essential (primary) hypertension: Secondary | ICD-10-CM

## 2021-02-11 DIAGNOSIS — R531 Weakness: Secondary | ICD-10-CM

## 2021-02-11 DIAGNOSIS — I639 Cerebral infarction, unspecified: Secondary | ICD-10-CM | POA: Diagnosis present

## 2021-02-11 DIAGNOSIS — F05 Delirium due to known physiological condition: Secondary | ICD-10-CM

## 2021-02-11 DIAGNOSIS — R2681 Unsteadiness on feet: Secondary | ICD-10-CM

## 2021-02-11 DIAGNOSIS — I2510 Atherosclerotic heart disease of native coronary artery without angina pectoris: Secondary | ICD-10-CM

## 2021-02-11 DIAGNOSIS — S12100A Unspecified displaced fracture of second cervical vertebra, initial encounter for closed fracture: Secondary | ICD-10-CM

## 2021-02-11 DIAGNOSIS — R296 Repeated falls: Secondary | ICD-10-CM

## 2021-02-11 DIAGNOSIS — I5032 Chronic diastolic (congestive) heart failure: Secondary | ICD-10-CM | POA: Diagnosis present

## 2021-02-11 DIAGNOSIS — M1009 Idiopathic gout, multiple sites: Secondary | ICD-10-CM | POA: Diagnosis present

## 2021-02-11 DIAGNOSIS — S12110A Anterior displaced Type II dens fracture, initial encounter for closed fracture: Secondary | ICD-10-CM | POA: Diagnosis present

## 2021-02-11 DIAGNOSIS — E039 Hypothyroidism, unspecified: Secondary | ICD-10-CM

## 2021-02-11 DIAGNOSIS — T148XXA Other injury of unspecified body region, initial encounter: Secondary | ICD-10-CM

## 2021-02-11 HISTORY — DX: Atherosclerotic heart disease of native coronary artery without angina pectoris: I25.10

## 2021-02-11 LAB — CBC WITH DIFFERENTIAL/PLATELET
Abs Immature Granulocytes: 0.02 10*3/uL (ref 0.00–0.07)
Basophils Absolute: 0 10*3/uL (ref 0.0–0.1)
Basophils Relative: 1 %
Eosinophils Absolute: 0.3 10*3/uL (ref 0.0–0.5)
Eosinophils Relative: 4 %
HCT: 38.5 % (ref 36.0–46.0)
Hemoglobin: 12.2 g/dL (ref 12.0–15.0)
Immature Granulocytes: 0 %
Lymphocytes Relative: 20 %
Lymphs Abs: 1.1 10*3/uL (ref 0.7–4.0)
MCH: 29.4 pg (ref 26.0–34.0)
MCHC: 31.7 g/dL (ref 30.0–36.0)
MCV: 92.8 fL (ref 80.0–100.0)
Monocytes Absolute: 0.5 10*3/uL (ref 0.1–1.0)
Monocytes Relative: 8 %
Neutro Abs: 3.8 10*3/uL (ref 1.7–7.7)
Neutrophils Relative %: 67 %
Platelets: 219 10*3/uL (ref 150–400)
RBC: 4.15 MIL/uL (ref 3.87–5.11)
RDW: 13.5 % (ref 11.5–15.5)
WBC: 5.7 10*3/uL (ref 4.0–10.5)
nRBC: 0 % (ref 0.0–0.2)

## 2021-02-11 LAB — URINALYSIS, ROUTINE W REFLEX MICROSCOPIC
Bilirubin Urine: NEGATIVE
Glucose, UA: NEGATIVE mg/dL
Hgb urine dipstick: NEGATIVE
Nitrite: NEGATIVE
Protein, ur: 100 mg/dL — AB
Specific Gravity, Urine: 1.01 (ref 1.005–1.030)
pH: 5.5 (ref 5.0–8.0)

## 2021-02-11 LAB — RESP PANEL BY RT-PCR (FLU A&B, COVID) ARPGX2
Influenza A by PCR: NEGATIVE
Influenza B by PCR: NEGATIVE
SARS Coronavirus 2 by RT PCR: NEGATIVE

## 2021-02-11 LAB — BASIC METABOLIC PANEL
Anion gap: 8 (ref 5–15)
BUN: 21 mg/dL (ref 8–23)
CO2: 25 mmol/L (ref 22–32)
Calcium: 9.4 mg/dL (ref 8.9–10.3)
Chloride: 105 mmol/L (ref 98–111)
Creatinine, Ser: 0.87 mg/dL (ref 0.44–1.00)
GFR, Estimated: >60 mL/min (ref >60)
Glucose, Bld: 110 mg/dL — ABNORMAL HIGH (ref 70–99)
Potassium: 4.1 mmol/L (ref 3.5–5.1)
Sodium: 138 mmol/L (ref 135–145)

## 2021-02-11 LAB — T4, FREE: Free T4: 1.3 ng/dL — ABNORMAL HIGH (ref 0.61–1.12)

## 2021-02-11 LAB — CBG MONITORING, ED
Glucose-Capillary: 100 mg/dL — ABNORMAL HIGH (ref 70–99)
Glucose-Capillary: 105 mg/dL — ABNORMAL HIGH (ref 70–99)

## 2021-02-11 LAB — URINALYSIS, MICROSCOPIC (REFLEX)

## 2021-02-11 LAB — TROPONIN I (HIGH SENSITIVITY): Troponin I (High Sensitivity): 16 ng/L (ref <18)

## 2021-02-11 LAB — TSH: TSH: 5.753 u[IU]/mL — ABNORMAL HIGH (ref 0.350–4.500)

## 2021-02-11 LAB — GLUCOSE, CAPILLARY: Glucose-Capillary: 142 mg/dL — ABNORMAL HIGH (ref 70–99)

## 2021-02-11 MED ORDER — ACETAMINOPHEN 500 MG PO TABS
1000.0000 mg | ORAL_TABLET | Freq: Once | ORAL | Status: AC
  Administered 2021-02-11: 1000 mg via ORAL
  Filled 2021-02-11: qty 2

## 2021-02-11 MED ORDER — LEVOTHYROXINE SODIUM 50 MCG PO TABS
75.0000 ug | ORAL_TABLET | Freq: Every day | ORAL | Status: DC
  Administered 2021-02-12 – 2021-02-13 (×2): 75 ug via ORAL
  Filled 2021-02-11 (×3): qty 1

## 2021-02-11 MED ORDER — LEVOTHYROXINE SODIUM 88 MCG PO TABS
88.0000 ug | ORAL_TABLET | Freq: Every day | ORAL | Status: DC
  Filled 2021-02-11: qty 1

## 2021-02-11 MED ORDER — ALLOPURINOL 100 MG PO TABS
100.0000 mg | ORAL_TABLET | Freq: Two times a day (BID) | ORAL | Status: DC
  Administered 2021-02-11 – 2021-02-14 (×6): 100 mg via ORAL
  Filled 2021-02-11 (×7): qty 1

## 2021-02-11 MED ORDER — LORAZEPAM 2 MG/ML IJ SOLN: 1.0000 mg | INTRAMUSCULAR | Status: DC | PRN

## 2021-02-11 MED ORDER — COLCHICINE 0.6 MG PO TABS
0.6000 mg | ORAL_TABLET | Freq: Every day | ORAL | Status: DC | PRN
  Filled 2021-02-11: qty 1

## 2021-02-11 MED ORDER — METOPROLOL SUCCINATE ER 50 MG PO TB24
50.0000 mg | ORAL_TABLET | Freq: Every day | ORAL | Status: DC
  Administered 2021-02-11 – 2021-02-14 (×4): 50 mg via ORAL
  Filled 2021-02-11 (×4): qty 1

## 2021-02-11 MED ORDER — INSULIN ASPART 100 UNIT/ML IJ SOLN: 0.0000 [IU] | Freq: Every day | INTRAMUSCULAR | Status: DC

## 2021-02-11 MED ORDER — OXYCODONE-ACETAMINOPHEN 5-325 MG PO TABS
1.0000 | ORAL_TABLET | ORAL | Status: DC | PRN
Start: 2021-02-11 — End: 2021-02-14
  Administered 2021-02-11: 1 via ORAL
  Filled 2021-02-11: qty 1

## 2021-02-11 MED ORDER — SODIUM CHLORIDE 0.9 % IV BOLUS (SEPSIS)
500.0000 mL | Freq: Once | INTRAVENOUS | Status: AC
  Administered 2021-02-11: 500 mL via INTRAVENOUS

## 2021-02-11 MED ORDER — ISOSORBIDE MONONITRATE ER 30 MG PO TB24
30.0000 mg | ORAL_TABLET | Freq: Every day | ORAL | Status: DC
  Administered 2021-02-11 – 2021-02-14 (×4): 30 mg via ORAL
  Filled 2021-02-11 (×4): qty 1

## 2021-02-11 MED ORDER — ACETAMINOPHEN 325 MG PO TABS
650.0000 mg | ORAL_TABLET | Freq: Four times a day (QID) | ORAL | Status: DC | PRN
  Administered 2021-02-12 (×2): 650 mg via ORAL
  Filled 2021-02-11 (×2): qty 2

## 2021-02-11 MED ORDER — ENOXAPARIN SODIUM 40 MG/0.4ML IJ SOSY
40.0000 mg | PREFILLED_SYRINGE | INTRAMUSCULAR | Status: DC
  Administered 2021-02-11 – 2021-02-14 (×4): 40 mg via SUBCUTANEOUS
  Filled 2021-02-11 (×4): qty 0.4

## 2021-02-11 MED ORDER — NITROGLYCERIN 0.4 MG SL SUBL: 0.4000 mg | SUBLINGUAL_TABLET | SUBLINGUAL | Status: DC | PRN

## 2021-02-11 MED ORDER — ONDANSETRON HCL 4 MG/2ML IJ SOLN: 4.0000 mg | Freq: Three times a day (TID) | INTRAMUSCULAR | Status: DC | PRN

## 2021-02-11 MED ORDER — INSULIN ASPART 100 UNIT/ML IJ SOLN
0.0000 [IU] | Freq: Three times a day (TID) | INTRAMUSCULAR | Status: DC
  Administered 2021-02-12 – 2021-02-14 (×3): 1 [IU] via SUBCUTANEOUS
  Filled 2021-02-11 (×3): qty 1

## 2021-02-11 MED ORDER — ROSUVASTATIN CALCIUM 5 MG PO TABS
5.0000 mg | ORAL_TABLET | Freq: Every day | ORAL | Status: DC
  Administered 2021-02-11 – 2021-02-14 (×4): 5 mg via ORAL
  Filled 2021-02-11 (×4): qty 1

## 2021-02-11 MED ORDER — CLOPIDOGREL BISULFATE 75 MG PO TABS
75.0000 mg | ORAL_TABLET | Freq: Every day | ORAL | Status: DC
  Administered 2021-02-11 – 2021-02-14 (×4): 75 mg via ORAL
  Filled 2021-02-11 (×4): qty 1

## 2021-02-11 MED ORDER — HYDRALAZINE HCL 20 MG/ML IJ SOLN
5.0000 mg | INTRAMUSCULAR | Status: DC | PRN
  Administered 2021-02-12: 5 mg via INTRAVENOUS
  Filled 2021-02-11: qty 1

## 2021-02-11 MED ORDER — TETANUS-DIPHTH-ACELL PERTUSSIS 5-2.5-18.5 LF-MCG/0.5 IM SUSY
0.5000 mL | PREFILLED_SYRINGE | Freq: Once | INTRAMUSCULAR | Status: AC
Start: 2021-02-11 — End: 2021-02-11
  Administered 2021-02-11: 0.5 mL via INTRAMUSCULAR
  Filled 2021-02-11: qty 0.5

## 2021-02-11 MED ORDER — LEVETIRACETAM 250 MG PO TABS
250.0000 mg | ORAL_TABLET | Freq: Two times a day (BID) | ORAL | Status: DC
  Administered 2021-02-11 – 2021-02-14 (×7): 250 mg via ORAL
  Filled 2021-02-11 (×8): qty 1

## 2021-02-11 NOTE — Evaluation (Signed)
Physical Therapy Evaluation Patient Details Name: [name] MRN: [id number] DOB: 1933-09-18 Today's Date: 02/11/2021  History of Present Illness  88 y.o. female with medical history significant of HTN, HLD, DM , stroke, GERD, thyroid cancer, hypothyroidism, gout, anxiety, rheumatoid arthritis, CAD, carotid artery stenosis, esophagitis, seizure, diverticulitis, dCHF, who presents with fall.   Clinical Impression  Pt admitted with above diagnosis. Pt received supine in bed agreeable to PT. Reports PLOF, DME, home lay out without difficulty. At baseline pt is mod-I with rollator and other RW in home. Pt endorsing multiple falls over the last few days leading to admission, is currently home alone due to husband being in SNF due to bilat fractured ankles. With [facility] slightly elevated pt able to transfer to sitting with very minA provided at trunk. Initially pt displaying post lean in standing with minguard but requiring minA and VC's for nose over toes to return to neutral standing and amb ~56' with very slow cadence. VC's provided for safe use of RW and step through pattern and sequencing RW with turns and stand to sit transfer to low chair in room to assess STS from normal chair height. With minA, and cues for correct hand placement, pt able to stand with increased time to RW and ambulate to EOB and transfer with supervision. Pt amenable to returning home with [facility] PT due to having 3 cats to take care of. However due to recurrent falls, limited supervision/support due to husband being in SNF, and increased risk of falls with transfers and incorrect use of RW, pt would benefit from SNF placement prior to transitioning to home environment. Anticipate pt to deny SNF, so pt would benefit from [facility] PT if that is the case. Pt educated if she wishes to return home she would benefit from frequent supervision/assist from family or friends/neighbors. Education provided on safe transfer techniques and energy  conservation techniques to reduce risk of future falls and improve safety with in home mobility if pt returns home. Pt currently with functional limitations due to the deficits listed below (see PT Problem List). Pt will benefit from skilled PT to increase their independence and safety with mobility to allow discharge to the venue listed below.     Recommendations for follow up therapy are one component of a multi-disciplinary discharge planning process, led by the attending physician.  Recommendations may be updated based on patient status, additional functional criteria and insurance authorization.  Follow Up Recommendations Skilled nursing-short term rehab (<3 hours/day)    Assistance Recommended at Discharge Frequent or constant Supervision/Assistance  Patient can return home with the following  A little help with walking and/or transfers;Assistance with cooking/housework;Assist for transportation;A little help with bathing/dressing/bathroom;Help with stairs or ramp for entrance    Equipment Recommendations Other (comment) (tbd by next venue of care)  Recommendations for Other Services       Functional Status Assessment Patient has had a recent decline in their functional status and demonstrates the ability to make significant improvements in function in a reasonable and predictable amount of time.     Precautions / Restrictions Precautions Precautions: Fall Restrictions Weight Bearing Restrictions: No      Mobility  Bed Mobility Overal bed mobility: Needs Assistance Bed Mobility: Supine to Sit, Sit to Supine     Supine to sit: Min assist Sit to supine: Supervision   General bed mobility comments: assist at trunk for support Patient Response: Cooperative  Transfers Overall transfer level: Needs assistance Equipment used: Rolling walker (2 wheels) Transfers:  Sit to/from Stand Sit to Stand: Min guard           General transfer comment: Display post lean in standing  initially. WIth cues for "nose over toes" able to attain standing upright    Ambulation/Gait Ambulation/Gait assistance: Min guard Gait Distance (Feet): 56 Feet Assistive device: Rolling walker (2 wheels) Gait Pattern/deviations: Step-through pattern, Decreased step length - right, Decreased step length - left       General Gait Details: Very slow moving throughout gait which pt reports is her baseline.  Stairs            Wheelchair Mobility    Modified Rankin (Stroke Patients Only)       Balance Overall balance assessment: Needs assistance Sitting-balance support: Feet unsupported, Bilateral upper extremity supported Sitting balance-Leahy Scale: Fair     Standing balance support: During functional activity, Reliant on assistive device for balance Standing balance-Leahy Scale: Poor Standing balance comment: Initially requiring cuing for upright standing posture. Once upright, fair balance appreciated at [facility].                             Pertinent Vitals/Pain Pain Assessment Pain Assessment: No/denies pain    Home Living Family/patient expects to be discharged to:: Private residence Living Arrangements: Spouse/significant other Available Help at Discharge: Family Type of Home: House Home Access: Level entry       Home Layout: One level Home Equipment: Conservation officer, nature (2 wheels);Rollator (4 wheels);BSC/3in1;Shower seat Additional Comments: Husband having multiple falls as well and is in SNF rehab. Pt has been alone at home with family and friends "checking in"    Prior Function Prior Level of Function : Independent/Modified Independent             Mobility Comments: Pt reports mod I with rollator but multiple falls at home. ADLs Comments: Pt reports ind with self care tasks and her and husband split IADLs.     Hand Dominance   Dominant Hand: Right    Extremity/Trunk Assessment   Upper Extremity Assessment Upper Extremity Assessment:  Generalized weakness    Lower Extremity Assessment Lower Extremity Assessment: Generalized weakness    Cervical / Trunk Assessment Cervical / Trunk Assessment: Normal  Communication   Communication: No difficulties  Cognition Arousal/Alertness: Awake/alert Behavior During Therapy: WFL for tasks assessed/performed Overall Cognitive Status: Within Functional Limits for tasks assessed                                          General Comments      Exercises Other Exercises Other Exercises: Role of PT in acute setting, D/c recs, energy conservation techniques, safe use of  hands and DME with transfers   Assessment/Plan    PT Assessment Patient needs continued PT services  PT Problem List Decreased strength;Decreased activity tolerance;Decreased balance;Decreased mobility       PT Treatment Interventions DME instruction;Balance training;Gait training;Neuromuscular re-education;Stair training;Functional mobility training;Patient/family education;Therapeutic activities;Therapeutic exercise    PT Goals (Current goals can be found in the Care Plan section)  Acute Rehab PT Goals Patient Stated Goal: To go home PT Goal Formulation: With patient Time For Goal Achievement: 02/25/21 Potential to Achieve Goals: Fair    Frequency Min 2X/week     Co-evaluation               AM-PAC PT "6  Clicks" Mobility  Outcome Measure Help needed turning from your back to your side while in a flat bed without using bedrails?: A Little Help needed moving from lying on your back to sitting on the side of a flat bed without using bedrails?: A Little Help needed moving to and from a bed to a chair (including a wheelchair)?: A Little Help needed standing up from a chair using your arms (e.g., wheelchair or bedside chair)?: A Little Help needed to walk in hospital room?: A Little Help needed climbing 3-5 steps with a railing? : A Lot 6 Click Score: 17    End of Session  Equipment Utilized During Treatment: Gait belt Activity Tolerance: Patient tolerated treatment well Patient left: in bed;with call bell/phone within reach Nurse Communication: Mobility status PT Visit Diagnosis: Unsteadiness on feet (R26.81);Other abnormalities of gait and mobility (R26.89);History of falling (Z91.81);Muscle weakness (generalized) (M62.81)    Time: 1329-1401 PT Time Calculation (min) (ACUTE ONLY): 32 min   Charges:   PT Evaluation $PT Eval Low Complexity: 1 Low PT Treatments $Gait Training: 23-37 mins        Liv Rallis M. Fairly IV, PT, DPT Physical Therapist- [facility]  02/11/2021, 3:10 PM

## 2021-02-11 NOTE — H&P (Signed)
History and Physical    Patient: [name] VWU:[id number] DOB: 07-Mar-1933 DOA: 02/11/2021 DOS: the patient was seen and examined on 02/11/2021 PCP: Idelle Crouch, MD   Patient coming from: Home   Chief Complaint: fall  HPI: [name] is a 88 y.o. female with medical history significant of HTN, HLD, DM , stroke, GERD, thyroid cancer, hypothyroidism, gout, anxiety, rheumatoid arthritis, CAD, carotid artery stenosis, esophagitis, seizure, diverticulitis, dCHF, who presents with fall.  Patient states that she has had multiple falls in the past 2 weeks, at least 5 times.  No seizure activity.  She fell again last night when she was trying to get to her bed, and tripped her steps.  She denies loss of consciousness.  No unilateral numbness or tinglings in extremities.  No head or neck injury.  She has skin tear in left elbow and left leg.  She also reports left hip pain, which is mild, aching, nonradiating.  Patient states that she has generalized weakness, with difficult walking. Patient denies chest pain, cough, shortness of breath.  No fever or chills.  Denies nausea vomiting, diarrhea or abdominal pain.  No symptoms of UTI.  Patient states that she had a skin cancer removed in the medial side of left lower leg recently.  Data review and ED course: I have personally reviewed labs and imaging studies.  WBC 5.7, negative COVID PCR, negative urinalysis, GFR > 60, temperature normal, blood pressure 182/49, heart rate 90, 68, RR 18, oxygen saturation 96% on room air.  CT of head is negative for acute intracranial abnormalities.  X-ray of left hip/pelvis is negative for acute injury.  Patient is placed MedSurg bed for observation.  EKG: Reviewed KEG independently.  Sinus rhythm, QTC 482, right axis deviation, early R wave progression, mild ST depression in inferior leads.  Review of Systems:   General: no fevers, chills, no body weight gain, has fatigue HEENT: no blurry  vision, hearing changes or sore throat Respiratory: no dyspnea, coughing, wheezing CV: no chest pain, no palpitations GI: no nausea, vomiting, abdominal pain, diarrhea, constipation GU: no dysuria, burning on urination, increased urinary frequency, hematuria  Ext: no leg edema Neuro: no unilateral weakness, numbness, or tingling, no vision change or hearing loss. Has fall Skin: no rash, has skin tear in left elbow and left lower leg MSK: No muscle spasm, no limitation of range of movement in spin. has finger joint deformities Heme: No easy bruising.  Travel history: No recent long distant travel.   Past Medical History:  Diagnosis Date   CAD (coronary artery disease)    a. lexiscan 08/2013: nl wall motion, no ischemia, EF 50-55%; b. cardiac cath 05/31/2014: mLAD 90%, dLAD 50%, dLCx 60%, 1st Mrg 80%, pRCA 60% s/p PCI/DES, mRCA 1st lesion 70%, mRCA 2nd 95% s/p PCI/long DES to cover entire mRCA, RPLB 40%;  c. 05/2014 Staged PCI of LAD w/ 2.5 x 15 mm Xience Alpine DES.   Carotid artery disease (HCC)    Chronic anxiety    Diverticulosis    Esophagitis    GERD (gastroesophageal reflux disease)    History of colon polyps    History of echocardiogram    a. 08/2013: normal LVSF, nl RVSF, no valvular regurgitation or stenosis    Hyperlipidemia    Hypertension    Hypothyroidism    Mild reactive airways disease    Osteoarthritis    PONV (postoperative nausea and vomiting)    Rheumatoid arthritis ([place])  Stroke [facility])    a. 05/2014 pt c/o garbled speech following cath 5/19. MRI/A 5/26 showed left caudate infarct->conservative mgmt per neuro.   Thyroid cancer ([place])    a. s/p right sided hemi-thyroidectomy; b. planning for radioactive iodine tx; c. followed by Dr. Marcelene [place]te   Type 2 diabetes mellitus [facility])    Past Surgical History:  Procedure Laterality Date   ABDOMINAL HYSTERECTOMY     APPENDECTOMY     AUGMENTATION MAMMAPLASTY     BACK SURGERY     BREAST IMPLANT REMOVAL  06/2001   CARDIAC  CATHETERIZATION N/A 06/06/2014   Procedure: Coronary/Graft Atherectomy;  Surgeon: Wellington Hampshire, MD;  Location: [place] CV LAB;  Service: Cardiovascular;  Laterality: N/A;   CARDIAC CATHETERIZATION N/A 05/31/2014   Procedure: Left Heart Cath;  Surgeon: Minna Merritts, MD;  Location: [place] CV [facility];  Service: Cardiovascular;  Laterality: N/A;   CARDIAC CATHETERIZATION N/A 05/31/2014   Procedure: Coronary Stent Intervention;  Surgeon: Wellington Hampshire, MD;  Location: [place] CV LAB;  Service: Cardiovascular;  Laterality: N/A;   CARPAL TUNNEL RELEASE     "I've have a total of 7" (06/06/2014)   CATARACT EXTRACTION W/ INTRAOCULAR LENS  IMPLANT, BILATERAL Bilateral    CHOLECYSTECTOMY     CORONARY ANGIOPLASTY WITH STENT PLACEMENT  05/31/2014; 06/06/2014   "2; 1"   GANGLION CYST EXCISION Right    AC joint   LUMBAR LAMINECTOMY  X 6   MENISCUS REPAIR Bilateral    "2 on the right; 1 on the left"   ROTATOR CUFF REPAIR     "I think 2 left, 2 right"   THYROIDECTOMY     TONSILLECTOMY     TUBAL LIGATION     Social History:  reports that she has never smoked. She has never used smokeless tobacco. She reports that she does not drink alcohol and does not use drugs.  Allergies  Allergen Reactions   Ciprofloxacin Diarrhea and Nausea And Vomiting   Codeine Diarrhea and Nausea And Vomiting   Metronidazole Diarrhea, Nausea And Vomiting and Nausea Only   Other Hives and Other (See Comments)    Distress   Sulfa Antibiotics Other (See Comments) and Hives    Distress Distress   Neomycin Itching   Neomycin-Bacitracin Zn-Polymyx Other (See Comments)    Reaction:  Unknown  Reaction:  Unknown    Bacitracin-Polymyxin B Rash   Celecoxib Itching, Rash and Other (See Comments)   Petrolatum-Zinc Oxide Hives   Statins Other (See Comments)    Reaction:  Muscle pain Reaction:  Muscle pain Reaction:  Muscle pain   Vioxx [Rofecoxib] Itching and Rash    Family History  Problem Relation Age of  Onset   Heart disease Sister        stent placed x 3     Prior to Admission medications   Medication Sig Start Date End Date Taking? Authorizing Provider  acetaminophen (TYLENOL) 500 MG tablet Take 500-1,000 mg by mouth every 6 (six) hours as needed for mild pain or moderate pain.    [provider]  allopurinol (ZYLOPRIM) 100 MG tablet TAKE 1 TABLET(100 MG) BY MOUTH TWICE DAILY 11/14/18   Newt Minion, MD  clopidogrel (PLAVIX) 75 MG tablet TAKE 1 TABLET BY MOUTH  DAILY 08/19/20   Minna Merritts, MD  Colchicine (MITIGARE) 0.6 MG CAPS Take 0.6 mg by mouth daily as needed. 03/05/20   Minna Merritts, MD  doxycycline (VIBRA-TABS) 100 MG tablet Take 1 tablet (100  mg total) by mouth 2 (two) times daily. 01/19/21   Lavonia Drafts, MD  furosemide (LASIX) 20 MG tablet Take 20 mg by mouth daily as needed for fluid. (Take with POTASSIUM)    [provider]  levETIRAcetam (KEPPRA) 250 MG tablet Take 1 tablet (250 mg total) by mouth 2 (two) times daily. 08/27/20   Lorella Nimrod, MD  levothyroxine (SYNTHROID) 88 MCG tablet TAKE 1 TABLET(88 MCG) BY MOUTH DAILY BEFORE BREAKFAST 11/11/20   Cammie Sickle, MD  metFORMIN (GLUCOPHAGE) 500 MG tablet Take 500 mg by mouth 2 (two) times a day. 05/10/18   [provider]  metoprolol succinate (TOPROL XL) 50 MG 24 hr tablet Take 1 tablet (50 mg total) by mouth daily. 05/13/15   Minna Merritts, MD  Multiple Vitamins-Minerals (ICAPS AREDS 2 PO) Take 1 tablet by mouth as directed.    [provider]  nitroGLYCERIN (NITROSTAT) 0.4 MG SL tablet PLACE 1 TABLET UNDER THE TONGUE EVERY 5 MINUTES AS NEEDED FOR CHEST PAIN 01/22/21   Minna Merritts, MD  potassium chloride (KLOR-CON) 10 MEQ tablet Take 10 mEq by mouth daily as needed (nutrient replenishment). (Take with FUROSEMIDE)    [provider]  rosuvastatin (CRESTOR) 5 MG tablet TAKE 1 TABLET(5 MG) BY MOUTH DAILY. MAY 05/22/20   Minna Merritts, MD    Physical  Exam: Vitals:   02/11/21 0900 02/11/21 1000 02/11/21 1100 02/11/21 1200  BP: (!) 167/80 (!) 142/78 (!) 168/72 (!) 145/74  Pulse: 86 80 73 80  Resp:  17 17 17   Temp:    98.1 F (36.7 C)  TempSrc:    Tympanic  SpO2: 99% 99% 100% 98%  Weight:      Height:       Physical exam:  General: Not in acute distress HEENT:       Eyes: PERRL, EOMI, no scleral icterus.       ENT: No discharge from the ears and nose, no pharynx injection, no tonsillar enlargement.        Neck: No JVD, no bruit, no mass felt. Heme: No neck lymph node enlargement. Cardiac: S1/S2, RRR, No murmurs, No gallops or rubs. Respiratory: No rales, wheezing, rhonchi or rubs. GI: Soft, nondistended, nontender, no rebound pain, no organomegaly, BS present. GU: No hematuria Ext: No pitting leg edema bilaterally. 1+DP/PT pulse bilaterally. Musculoskeletal: has finger joint deformities, No joint redness or warmth, no limitation of ROM in spin. Skin: has skin tear in left elbow and left lower leg        Neuro: Alert, oriented X3, cranial nerves II-XII grossly intact, moves all extremities normally. Psych: Patient is not psychotic, no suicidal or hemocidal ideation.   Assessment/Plan Principal Problem:   Fall Active Problems:   Generalized weakness   Multiple skin tears   Seizure (HCC)   Stroke (HCC)   Idiopathic gout of multiple sites   Hypothyroidism   Hyperlipidemia   Essential hypertension   Diabetes mellitus without complication (HCC)   CAD (coronary artery disease)   Chronic diastolic CHF (congestive heart failure) (HCC)   Assessment and Plan: * Fall- (present on admission) Patient has generalized weakness and multiple falls recently.  Fortunately patient did not have acute injury.  Patient complains of left hip pain.  CT head negative.  X-ray of her left hip and pelvis negative for bony fracture.  No focal neurodeficit on physical examination, likely due to multifactorial etiology, including older age,  history of rheumatoid arthritis.   -Placed on MedSurg  bed for position. -fall precaution -PT/OT -As needed Tylenol and Percocet for pain -Consult TOC for home health and possible rehab placement  Generalized weakness See assessment and plan under fall  Multiple skin tears- (present on admission) -wound care consult  Seizure ([place]) Seizure -Seizure precaution -When necessary Ativan for seizure -Continue Home medications: Keppra level    Stroke ([place])- (present on admission) - Continue Crestor, Plavix  Idiopathic gout of multiple sites- (present on admission) - Allopurinol, colchicine  Hypothyroidism- (present on admission) - Synthroid  Hyperlipidemia- (present on admission) - Crestor  Essential hypertension- (present on admission) -IV hydralazine prn -Metoprolol  Diabetes mellitus without complication (HCC) Recent A1c 6.1, well controlled.  Patient was taking metformin, but not taking it now -SSI  CAD (coronary artery disease)- (present on admission) No chest pain -Plavix, metoprolol, as needed nitroglycerin, Crestor, imdur  Chronic diastolic CHF (congestive heart failure) ([place])- (present on admission) 2D echo on 06/07/2014 showed EF of 50 to 55% with grade 2 diastolic dysfunction.  Patient does not have shortness breath, no leg edema or JVD.  CHF is compensated. -Hold home as needed Lasix         Advance Care Planning:   Code Status: Full Code   Consults: none  Family Communication: I called her daughter  DVT ppx:   sq Lovenox     Severity of Illness: The appropriate patient status for this patient is OBSERVATION. Observation status is judged to be reasonable and necessary in order to provide the required intensity of service to ensure the patient's safety. The patient's presenting symptoms, physical exam findings, and initial radiographic and laboratory data in the context of their medical condition is felt to place them at decreased risk for further  clinical deterioration. Furthermore, it is anticipated that the patient will be medically stable for discharge from the hospital within 2 midnights of admission.    Author: Ivor Costa, MD 02/11/2021 2:42 PM  For on call review [email or web address].

## 2021-02-11 NOTE — ED Provider Notes (Signed)
[facility] Provider Note    Event Date/Time   First MD Initiated Contact with Patient 02/11/21 0105     (approximate)   History   Fall   HPI  [name] is a 88 y.o. female with history of CAD, hypertension, hyperlipidemia, diabetes, CVA on Plavix, rheumatoid arthritis who presents to the emergency department with multiple falls over the past few weeks.  She reports approximately 5 falls in the past 2 weeks.  She states prior to that she fell in June states that was due to her having a seizure.  She states she has not had any seizure since.  She is on Keppra.  She has fallen 3 times 2 weeks ago and then once 3 days ago and once today.  She describes most of these falls as mechanical and feels like she has tripped over things or is having a hard time using her walker.  She denies any preceding chest pain, shortness of breath, dizziness.  Denies any fevers, cough, vomiting or diarrhea.  Has a skin tear to the left elbow that she states she obtained when she fell 3 days ago and then a skin tear to her left shin that occurred tonight.  She is unsure of her last tetanus vaccine.  She states when she fell 2 weeks ago she did fall onto her bottom outside of a restaurant and she states that she slipped on the wet ground.  She is complaining of pain to the left hip from that fall.  She does not think that during any of these fall she hit her head.  She denies any neck or back pain.  No numbness or focal weakness.   History provided by patient.    Past Medical History:  Diagnosis Date   CAD (coronary artery disease)    a. lexiscan 08/2013: nl wall motion, no ischemia, EF 50-55%; b. cardiac cath 05/31/2014: mLAD 90%, dLAD 50%, dLCx 60%, 1st Mrg 80%, pRCA 60% s/p PCI/DES, mRCA 1st lesion 70%, mRCA 2nd 95% s/p PCI/long DES to cover entire mRCA, RPLB 40%;  c. 05/2014 Staged PCI of LAD w/ 2.5 x 15 mm Xience [place] DES.   Carotid artery disease (HCC)    Chronic anxiety     Diverticulosis    Esophagitis    GERD (gastroesophageal reflux disease)    History of colon polyps    History of echocardiogram    a. 08/2013: normal LVSF, nl RVSF, no valvular regurgitation or stenosis    Hyperlipidemia    Hypertension    Hypothyroidism    Mild reactive airways disease    Osteoarthritis    PONV (postoperative nausea and vomiting)    Rheumatoid arthritis ([place])    Stroke ([place])    a. 05/2014 pt c/o garbled speech following cath 5/19. MRI/A 5/26 showed left caudate infarct->conservative mgmt per neuro.   Thyroid cancer ([place])    a. s/p right sided hemi-thyroidectomy; b. planning for radioactive iodine tx; c. followed by Dr. Marcelene Butte   Type 2 diabetes mellitus [facility])     Past Surgical History:  Procedure Laterality Date   ABDOMINAL HYSTERECTOMY     APPENDECTOMY     AUGMENTATION MAMMAPLASTY     BACK SURGERY     BREAST IMPLANT REMOVAL  06/2001   CARDIAC CATHETERIZATION N/A 06/06/2014   Procedure: Coronary/Graft Atherectomy;  Surgeon: Wellington Hampshire, MD;  Location: [place] [facility];  Service: Cardiovascular;  Laterality: N/A;   CARDIAC CATHETERIZATION N/A 05/31/2014   Procedure:  Left Heart Cath;  Surgeon: Minna Merritts, MD;  Location: [place] [facility];  Service: Cardiovascular;  Laterality: N/A;   CARDIAC CATHETERIZATION N/A 05/31/2014   Procedure: Coronary Stent Intervention;  Surgeon: Wellington Hampshire, MD;  Location: [place] [facility];  Service: Cardiovascular;  Laterality: N/A;   CARPAL TUNNEL RELEASE     "I've have a total of 7" (06/06/2014)   CATARACT EXTRACTION W/ INTRAOCULAR LENS  IMPLANT, BILATERAL Bilateral    CHOLECYSTECTOMY     CORONARY ANGIOPLASTY WITH STENT PLACEMENT  05/31/2014; 06/06/2014   "2; 1"   GANGLION CYST EXCISION Right    AC joint   LUMBAR LAMINECTOMY  X 6   MENISCUS REPAIR Bilateral    "2 on the right; 1 on the left"   ROTATOR CUFF REPAIR     "I think 2 left, 2 right"   THYROIDECTOMY     TONSILLECTOMY     TUBAL LIGATION       MEDICATIONS:  Prior to Admission medications   Medication Sig Start Date End Date Taking? Authorizing Provider  acetaminophen (TYLENOL) 500 MG tablet Take 500-1,000 mg by mouth every 6 (six) hours as needed for mild pain or moderate pain.    [provider]  allopurinol (ZYLOPRIM) 100 MG tablet TAKE 1 TABLET(100 MG) BY MOUTH TWICE DAILY 11/14/18   Newt Minion, MD  clopidogrel (PLAVIX) 75 MG tablet TAKE 1 TABLET BY MOUTH  DAILY 08/19/20   Minna Merritts, MD  Colchicine (MITIGARE) 0.6 MG CAPS Take 0.6 mg by mouth daily as needed. 03/05/20   Minna Merritts, MD  doxycycline (VIBRA-TABS) 100 MG tablet Take 1 tablet (100 mg total) by mouth 2 (two) times daily. 01/19/21   Lavonia Drafts, MD  furosemide (LASIX) 20 MG tablet Take 20 mg by mouth daily as needed for fluid. (Take with POTASSIUM)    [provider]  levETIRAcetam (KEPPRA) 250 MG tablet Take 1 tablet (250 mg total) by mouth 2 (two) times daily. 08/27/20   Lorella Nimrod, MD  levothyroxine (SYNTHROID) 88 MCG tablet TAKE 1 TABLET(88 MCG) BY MOUTH DAILY BEFORE BREAKFAST 11/11/20   Cammie Sickle, MD  metFORMIN (GLUCOPHAGE) 500 MG tablet Take 500 mg by mouth 2 (two) times a day. 05/10/18   [provider]  metoprolol succinate (TOPROL XL) 50 MG 24 hr tablet Take 1 tablet (50 mg total) by mouth daily. 05/13/15   Minna Merritts, MD  Multiple Vitamins-Minerals (ICAPS AREDS 2 PO) Take 1 tablet by mouth as directed.    [provider]  nitroGLYCERIN (NITROSTAT) 0.4 MG SL tablet PLACE 1 TABLET UNDER THE TONGUE EVERY 5 MINUTES AS NEEDED FOR CHEST PAIN 01/22/21   Minna Merritts, MD  potassium chloride (KLOR-CON) 10 MEQ tablet Take 10 mEq by mouth daily as needed (nutrient replenishment). (Take with FUROSEMIDE)    [provider]  rosuvastatin (CRESTOR) 5 MG tablet TAKE 1 TABLET(5 MG) BY MOUTH DAILY. MAY 05/22/20   Minna Merritts, MD    Physical Exam   Triage Vital Signs: ED Triage Vitals   Enc Vitals Group     BP 02/10/21 2135 (!) 192/89     Pulse Rate 02/10/21 2135 90     Resp 02/10/21 2135 18     Temp 02/10/21 2135 98.5 F (36.9 C)     Temp Source 02/10/21 2135 Oral     SpO2 02/10/21 2135 96 %     Weight 02/10/21 2136 130 lb 1.1 oz (59 kg)  Height 02/10/21 2136 5\' 1"  (1.549 m)     Head Circumference --      Peak Flow --      Pain Score 02/10/21 2135 0     Pain Loc --      Pain Edu? --      Excl. in [place]? --     Most recent vital signs: Vitals:   02/11/21 0500 02/11/21 0530  BP: (!) 188/71 (!) 176/60  Pulse: 86 75  Resp: 14 15  Temp:    SpO2: 99% 98%     CONSTITUTIONAL: Alert and oriented and responds appropriately to questions.  Elderly, in no distress HEAD: Normocephalic; atraumatic EYES: Conjunctivae clear, PERRL, EOMI ENT: normal nose; no rhinorrhea; moist mucous membranes; pharynx without lesions noted; no dental injury; no septal hematoma, no epistaxis; no facial deformity or bony tenderness NECK: Supple, no midline spinal tenderness, step-off or deformity; trachea midline CARD: RRR; S1 and S2 appreciated; no murmurs, no clicks, no rubs, no gallops RESP: Normal chest excursion without splinting or tachypnea; breath sounds clear and equal bilaterally; no wheezes, no rhonchi, no rales; no hypoxia or respiratory distress CHEST:  chest wall stable, no crepitus or ecchymosis or deformity, nontender to palpation; no flail chest ABD/GI: Normal bowel sounds; non-distended; soft, non-tender, no rebound, no guarding; no ecchymosis or other lesions noted PELVIS:  stable, patient is slightly tender to palpation over the posterior left hip without leg length discrepancy or deformity.  There is old bruising noted to the left buttock. BACK:  The back appears normal; no midline spinal tenderness, step-off or deformity EXT: Normal ROM in all joints; non-tender to palpation; no edema; normal capillary refill; no cyanosis, no bony tenderness or bony deformity of  patient's extremities, no joint effusion, compartments are soft, extremities are warm and well-perfused, no ecchymosis SKIN: Normal color for age and race; warm, hemostatic and noninfected skin tear to the left elbow and left mid shin NEURO: No facial asymmetry, normal speech, moving all extremities equally, reports normal sensation diffusely  ED Results / Procedures / Treatments   LABS: (all labs ordered are listed, but only abnormal results are displayed) Labs Reviewed  BASIC METABOLIC PANEL - Abnormal; Notable for the following components:      Result Value   Glucose, Bld 110 (*)    All other components within normal limits  URINALYSIS, ROUTINE W REFLEX MICROSCOPIC - Abnormal; Notable for the following components:   Ketones, ur TRACE (*)    Protein, ur 100 (*)    Leukocytes,Ua TRACE (*)    All other components within normal limits  URINALYSIS, MICROSCOPIC (REFLEX) - Abnormal; Notable for the following components:   Bacteria, UA RARE (*)    All other components within normal limits  RESP PANEL BY RT-PCR (FLU A&B, COVID) ARPGX2  CBC WITH DIFFERENTIAL/PLATELET  TROPONIN I (HIGH SENSITIVITY)     EKG:  EKG Interpretation  Date/Time:  Tuesday February 11 2021 04:55:35 EST Ventricular Rate:  78 PR Interval:  171 QRS Duration: 114 QT Interval:  423 QTC Calculation: 482 R Axis:   95 Text Interpretation: Right and left arm electrode reversal, interpretation assumes no reversal Sinus rhythm Borderline intraventricular conduction delay Confirmed by Pryor Curia [phone]) on 02/11/2021 4:57:41 AM          RADIOLOGY: My personal review and interpretation of imaging: CT head shows no acute abnormality.  Left hip x-ray unremarkable.  I have personally reviewed all radiology reports. CT HEAD WO CONTRAST (5MM)  Result Date: 02/11/2021 CLINICAL  DATA:  Recent fall with headaches, initial encounter EXAM: CT HEAD WITHOUT CONTRAST TECHNIQUE: Contiguous axial images were obtained from the  base of the skull through the vertex without intravenous contrast. RADIATION DOSE REDUCTION: This exam was performed according to the departmental dose-optimization program which includes automated exposure control, adjustment of the mA and/or kV according to patient size and/or use of iterative reconstruction technique. COMPARISON:  08/25/2020 FINDINGS: Brain: No evidence of acute infarction, hemorrhage, hydrocephalus, extra-axial collection or mass lesion/mass effect. Chronic atrophic and ischemic changes are noted. Vascular: No hyperdense vessel or unexpected calcification. Skull: Normal. Negative for fracture or focal lesion. Sinuses/Orbits: No acute finding. Other: None. IMPRESSION: Atrophic and ischemic changes stable from the prior exam. Electronically Signed   By: Inez Catalina M.D.   On: 02/11/2021 02:28   DG Hip Unilat W or Wo Pelvis 2-3 Views Left  Result Date: 02/11/2021 CLINICAL DATA:  Fall EXAM: DG HIP (WITH OR WITHOUT PELVIS) 2-3V LEFT COMPARISON:  None. FINDINGS: No fracture or dislocation is seen. Bowel hip joint spaces are preserved. Postsurgical changes involving the lower lumbar spine. Vascular calcifications. IMPRESSION: Negative. Electronically Signed   By: Julian Hy M.D.   On: 02/11/2021 02:00     PROCEDURES:  Critical Care performed: No     .1-3 Lead EKG Interpretation Performed by: Jream Broyles, Delice Bison, DO Authorized by: Sallie Maker, Delice Bison, DO     Interpretation: normal     ECG rate:  85   ECG rate assessment: normal     Rhythm: sinus rhythm     Ectopy: none     Conduction: normal      IMPRESSION / MDM / ASSESSMENT AND PLAN / ED COURSE  I reviewed the triage vital signs and the nursing notes.  Patient here with multiple falls.  The patient is on the cardiac monitor to evaluate for evidence of arrhythmia and/or significant heart rate changes.   DIFFERENTIAL DIAGNOSIS (includes but not limited to):   Mechanical falls, ACS, dehydration, UTI, anemia, electrolyte  derangement, fractures, intracranial hemorrhage   PLAN: We will obtain labs - CBC, BMP, urine, EKG.  Will obtain CT of the head given multiple falls while on Plavix and x-ray of the left hip.  We will update her tetanus vaccine.  Will give Tylenol for pain control.   MEDICATIONS GIVEN IN ED: Medications  sodium chloride 0.9 % bolus 500 mL (has no administration in time range)  Tdap (BOOSTRIX) injection 0.5 mL (0.5 mLs Intramuscular Given 02/11/21 0230)  acetaminophen (TYLENOL) tablet 1,000 mg (1,000 mg Oral Given 02/11/21 0220)     ED COURSE: Patient's head CT and hip x-ray reviewed by myself and radiologist show no acute traumatic injury.  Patient unable to stand here even with significant assistance and using a walker.  This is different from her baseline.  She lives at home with her husband but states her husband is currently in rehab as he has bilateral lower extremity fractures.  I feel given she is not able to ambulate on her own that she will either need admission versus rehab/SNF placement herself.   6:25 AM  Pt's urine shows no sign of infection or significant dehydration however she is still very weak and not even able to stand on her own which is very different from her baseline.  Will discuss with hospitalist for admission for generalized weakness and frequent falls.   CONSULTS:  Consulted and discussed patient's case with hospitalist, Dr. Blaine Hamper.  I have recommended admission and consulting physician agrees  and will place admission orders.  Patient (and family if present) agree with this plan.   I reviewed all nursing notes, vitals, pertinent previous records.  All labs, EKGs, imaging ordered have been independently reviewed and interpreted by myself.    OUTSIDE RECORDS REVIEWED: Reviewed patient's most recent PCP note from Dr. Doy Hutching on 01/01/2021.         FINAL CLINICAL IMPRESSION(S) / ED DIAGNOSES   Final diagnoses:  Frequent falls  Skin tear of left elbow without  complication, initial encounter  Noninfected skin tear of left leg, initial encounter  Generalized weakness     Rx / DC Orders   ED Discharge Orders     None        Note:  This document was prepared using Dragon voice recognition software and may include unintentional dictation errors.   Yailyn Strack, Delice Bison, DO 02/11/21 [phone]

## 2021-02-11 NOTE — Assessment & Plan Note (Addendum)
Type 2 diabetes without complication.  Recent A1c 6.1, well controlled.  All fingersticks have been good.

## 2021-02-11 NOTE — Assessment & Plan Note (Addendum)
Continue metoprolol. 

## 2021-02-11 NOTE — Assessment & Plan Note (Addendum)
Physical therapy recommending rehab 

## 2021-02-11 NOTE — Evaluation (Signed)
Occupational Therapy Evaluation Patient Details Name: [name] MRN: [id number] DOB: 09/28/1933 Today's Date: 02/11/2021   History of Present Illness 88 y.o. female with medical history significant of HTN, HLD, DM , stroke, GERD, thyroid cancer, hypothyroidism, gout, anxiety, rheumatoid arthritis, CAD, carotid artery stenosis, esophagitis, seizure, diverticulitis, dCHF, who presents with fall.   Clinical Impression   Patient presenting with decreased Ind in self care, balance, functional mobility/transfers, endurance, and safety awareness. Patient reports living at home with husband and being mod I with use of rollator at baseline. Pt has had multiple falls in the last few weeks. Pt's husband has also had several falls and is currently in SNF rehab. Pt does not have 24/7 assist. She reports he daughter works during the day and she has some neighbors that could check in on her. Pt needing mod A for bed mobility and mod A for functional transfers with posterior bias in standing. Pt is able to progress to min A with RW in room but continues to need mod lifting assistance to stand from various surfaces. Patient will benefit from acute OT to increase overall independence in the areas of ADLs, functional mobility, and safety awareness in order to safely discharge to next venue of care.      Recommendations for follow up therapy are one component of a multi-disciplinary discharge planning process, led by the attending physician.  Recommendations may be updated based on patient status, additional functional criteria and insurance authorization.   Follow Up Recommendations  Skilled nursing-short term rehab (<3 hours/day)    Assistance Recommended at Discharge Frequent or constant Supervision/Assistance  Patient can return home with the following A lot of help with walking and/or transfers;A lot of help with bathing/dressing/bathroom;Assist for transportation;Assistance with cooking/housework     Functional Status Assessment  Patient has had a recent decline in their functional status and demonstrates the ability to make significant improvements in function in a reasonable and predictable amount of time.  Equipment Recommendations  BSC/3in1       Precautions / Restrictions Precautions Precautions: Fall      Mobility Bed Mobility Overal bed mobility: Needs Assistance Bed Mobility: Supine to Sit, Sit to Supine     Supine to sit: Mod assist Sit to supine: Mod assist   General bed mobility comments: assist for trunk support and B LEs    Transfers Overall transfer level: Needs assistance Equipment used: Rolling walker (2 wheels) Transfers: Sit to/from Stand, Bed to chair/wheelchair/BSC Sit to Stand: Mod assist     Step pivot transfers: Mod assist            Balance Overall balance assessment: Needs assistance Sitting-balance support: Feet unsupported, Bilateral upper extremity supported Sitting balance-Leahy Scale: Fair     Standing balance support: During functional activity, Reliant on assistive device for balance Standing balance-Leahy Scale: Poor                             ADL either performed or assessed with clinical judgement   ADL Overall ADL's : Needs assistance/impaired                         Toilet Transfer: Minimal assistance;Moderate assistance;Rolling walker (2 wheels);BSC/3in1   Toileting- Clothing Manipulation and Hygiene: Moderate assistance;Sit to/from stand         General ADL Comments: posterior bias in standing requiring additional assistance for safety     Vision Patient Visual  Report: No change from baseline              Pertinent Vitals/Pain Pain Assessment Pain Assessment: No/denies pain     Hand Dominance Right   Extremity/Trunk Assessment Upper Extremity Assessment Upper Extremity Assessment: Generalized weakness (skin tear on L UE)   Lower Extremity Assessment Lower Extremity  Assessment: Generalized weakness (large skin tear on L LE)       Communication Communication Communication: No difficulties   Cognition Arousal/Alertness: Awake/alert Behavior During Therapy: WFL for tasks assessed/performed Overall Cognitive Status: Within Functional Limits for tasks assessed                                                  Home Living Family/patient expects to be discharged to:: Private residence Living Arrangements: Spouse/significant other Available Help at Discharge: Family Type of Home: House Home Access: Level entry     Home Layout: One level     Bathroom Shower/Tub: Walk-in shower         Home Equipment: Conservation officer, nature (2 wheels);Rollator (4 wheels);BSC/3in1;Shower seat   Additional Comments: Husband having multiple falls as well and is not in SNF rehab. Pt has been alone at home with family and friends "checking in"      Prior Functioning/Environment Prior Level of Function : Independent/Modified Independent             Mobility Comments: Pt reports mod I with rollator but multiple falls at home. ADLs Comments: Pt reports ind with self care tasks and her and husband split IADLs.        OT Problem List: Decreased strength;Decreased knowledge of use of DME or AE;Decreased activity tolerance;Impaired balance (sitting and/or standing);Decreased safety awareness;Pain;Cardiopulmonary status limiting activity      OT Treatment/Interventions: Self-care/ADL training;Manual therapy;Therapeutic exercise;Patient/family education;Balance training;Energy conservation;Therapeutic activities;DME and/or AE instruction    OT Goals(Current goals can be found in the care plan section) Acute Rehab OT Goals Patient Stated Goal: to go home OT Goal Formulation: With patient Time For Goal Achievement: 02/25/21  OT Frequency: Min 2X/week       AM-PAC OT "6 Clicks" Daily Activity     Outcome Measure Help from another person eating  meals?: None Help from another person taking care of personal grooming?: A Little Help from another person toileting, which includes using toliet, bedpan, or urinal?: A Lot Help from another person bathing (including washing, rinsing, drying)?: A Lot Help from another person to put on and taking off regular upper body clothing?: A Little Help from another person to put on and taking off regular lower body clothing?: A Lot 6 Click Score: 16   End of Session Equipment Utilized During Treatment: Rolling walker (2 wheels) Nurse Communication: Mobility status  Activity Tolerance: Patient tolerated treatment well Patient left: in bed;with call bell/phone within reach  OT Visit Diagnosis: Unsteadiness on feet (R26.81);Repeated falls (R29.6);Muscle weakness (generalized) (M62.81)                Time: 2409-7353 OT Time Calculation (min): 20 min Charges:  OT General Charges $OT Visit: 1 Visit OT Evaluation $OT Eval Moderate Complexity: 1 Mod OT Treatments $Self Care/Home Management : 8-22 mins  Darleen Crocker, MS, OTR/L , CBIS ascom [phone]  02/11/21, 12:50 PM

## 2021-02-11 NOTE — Assessment & Plan Note (Deleted)
Patient has generalized weakness and multiple falls recently.  Fortunately patient did not have acute injury.  Patient complains of left hip pain.  CT head negative.  X-ray of her left hip and pelvis negative for bony fracture.  No focal neurodeficit on physical examination, likely due to multifactorial etiology, including older age, history of rheumatoid arthritis.   -Placed on MedSurg bed for position. -fall precaution -PT/OT -As needed Tylenol and Percocet for pain -Consult TOC for home health and possible rehab placement

## 2021-02-11 NOTE — Assessment & Plan Note (Addendum)
Prior history of completed stroke.  Continue Crestor, Plavix.

## 2021-02-11 NOTE — ED Notes (Signed)
Patient transported to X-ray 

## 2021-02-11 NOTE — Assessment & Plan Note (Addendum)
Continue Plavix, metoprolol, as needed nitroglycerin, Crestor, imdur

## 2021-02-11 NOTE — Assessment & Plan Note (Addendum)
2D echo on 06/07/2014 showed EF of 50 to 55%.  Currently no signs of heart failure.

## 2021-02-11 NOTE — Assessment & Plan Note (Addendum)
Continue Crestor 

## 2021-02-11 NOTE — Assessment & Plan Note (Addendum)
Continue allopurinol, colchicine

## 2021-02-11 NOTE — Consult Note (Signed)
WOC Nurse Consult Note: Patient receiving care in [facility] ED[facility]. Consult completed remotely after review of record and images. Reason for Consult: skin tears to left elbow and LLE. Wound type: trauma injuries; patient had a fall Pressure Injury POA: Yes/No/NA Measurement: Wound bed: see photos Drainage (amount, consistency, odor) serosanginous Periwound: intact Dressing procedure/placement/frequency: SATURATE any dressings on the left elbow and LLE with saline before attempting to remove.  Place Xeroform gauze Kellie Simmering [phone]) over each skin tear, cover with dry gauze, secure with kerlix.  Thank you for the consult.[facility] nurse will not follow at this time.  Please re-consult the [place] team if needed.  Val Riles, RN, MSN, CWOCN, CNS-BC, pager [phone]

## 2021-02-11 NOTE — Assessment & Plan Note (Signed)
Synthroid 

## 2021-02-11 NOTE — Assessment & Plan Note (Addendum)
Saturated dressings on the left elbow and left lower extremity with saline before removing.  Place Xeroform gauze Kellie Simmering [phone]) overreached skin tear and cover with dry gauze and secure with Kerlix.

## 2021-02-11 NOTE — Assessment & Plan Note (Addendum)
Continue Keppra.

## 2021-02-12 ENCOUNTER — Inpatient Hospital Stay: Payer: Medicare Other

## 2021-02-12 ENCOUNTER — Observation Stay: Payer: Medicare Other

## 2021-02-12 ENCOUNTER — Encounter: Payer: Self-pay | Admitting: Internal Medicine

## 2021-02-12 DIAGNOSIS — Z23 Encounter for immunization: Secondary | ICD-10-CM | POA: Diagnosis present

## 2021-02-12 DIAGNOSIS — S12100A Unspecified displaced fracture of second cervical vertebra, initial encounter for closed fracture: Secondary | ICD-10-CM

## 2021-02-12 DIAGNOSIS — E039 Hypothyroidism, unspecified: Secondary | ICD-10-CM | POA: Diagnosis not present

## 2021-02-12 DIAGNOSIS — Z8673 Personal history of transient ischemic attack (TIA), and cerebral infarction without residual deficits: Secondary | ICD-10-CM | POA: Diagnosis not present

## 2021-02-12 DIAGNOSIS — E119 Type 2 diabetes mellitus without complications: Secondary | ICD-10-CM | POA: Diagnosis present

## 2021-02-12 DIAGNOSIS — I110 Hypertensive heart disease with heart failure: Secondary | ICD-10-CM | POA: Diagnosis present

## 2021-02-12 DIAGNOSIS — M069 Rheumatoid arthritis, unspecified: Secondary | ICD-10-CM | POA: Diagnosis present

## 2021-02-12 DIAGNOSIS — Z85850 Personal history of malignant neoplasm of thyroid: Secondary | ICD-10-CM | POA: Diagnosis not present

## 2021-02-12 DIAGNOSIS — I2510 Atherosclerotic heart disease of native coronary artery without angina pectoris: Secondary | ICD-10-CM | POA: Diagnosis present

## 2021-02-12 DIAGNOSIS — I5032 Chronic diastolic (congestive) heart failure: Secondary | ICD-10-CM

## 2021-02-12 DIAGNOSIS — R451 Restlessness and agitation: Secondary | ICD-10-CM | POA: Diagnosis present

## 2021-02-12 DIAGNOSIS — R2681 Unsteadiness on feet: Secondary | ICD-10-CM

## 2021-02-12 DIAGNOSIS — S81812A Laceration without foreign body, left lower leg, initial encounter: Secondary | ICD-10-CM | POA: Diagnosis present

## 2021-02-12 DIAGNOSIS — Z86010 Personal history of colonic polyps: Secondary | ICD-10-CM | POA: Diagnosis not present

## 2021-02-12 DIAGNOSIS — Z20822 Contact with and (suspected) exposure to covid-19: Secondary | ICD-10-CM | POA: Diagnosis present

## 2021-02-12 DIAGNOSIS — R569 Unspecified convulsions: Secondary | ICD-10-CM | POA: Diagnosis present

## 2021-02-12 DIAGNOSIS — F419 Anxiety disorder, unspecified: Secondary | ICD-10-CM | POA: Diagnosis present

## 2021-02-12 DIAGNOSIS — F05 Delirium due to known physiological condition: Secondary | ICD-10-CM | POA: Diagnosis not present

## 2021-02-12 DIAGNOSIS — S12100S Unspecified displaced fracture of second cervical vertebra, sequela: Secondary | ICD-10-CM | POA: Diagnosis not present

## 2021-02-12 DIAGNOSIS — Y92018 Other place in single-family (private) house as the place of occurrence of the external cause: Secondary | ICD-10-CM | POA: Diagnosis not present

## 2021-02-12 DIAGNOSIS — K219 Gastro-esophageal reflux disease without esophagitis: Secondary | ICD-10-CM | POA: Diagnosis present

## 2021-02-12 DIAGNOSIS — I639 Cerebral infarction, unspecified: Secondary | ICD-10-CM

## 2021-02-12 DIAGNOSIS — I6529 Occlusion and stenosis of unspecified carotid artery: Secondary | ICD-10-CM | POA: Diagnosis present

## 2021-02-12 DIAGNOSIS — E890 Postprocedural hypothyroidism: Secondary | ICD-10-CM | POA: Diagnosis present

## 2021-02-12 DIAGNOSIS — S12120A Other displaced dens fracture, initial encounter for closed fracture: Secondary | ICD-10-CM | POA: Diagnosis present

## 2021-02-12 DIAGNOSIS — R296 Repeated falls: Secondary | ICD-10-CM | POA: Diagnosis present

## 2021-02-12 DIAGNOSIS — W010XXA Fall on same level from slipping, tripping and stumbling without subsequent striking against object, initial encounter: Secondary | ICD-10-CM | POA: Diagnosis present

## 2021-02-12 DIAGNOSIS — R531 Weakness: Secondary | ICD-10-CM | POA: Diagnosis present

## 2021-02-12 DIAGNOSIS — Z7902 Long term (current) use of antithrombotics/antiplatelets: Secondary | ICD-10-CM | POA: Diagnosis not present

## 2021-02-12 DIAGNOSIS — S51012A Laceration without foreign body of left elbow, initial encounter: Secondary | ICD-10-CM | POA: Diagnosis present

## 2021-02-12 DIAGNOSIS — Z79899 Other long term (current) drug therapy: Secondary | ICD-10-CM | POA: Diagnosis not present

## 2021-02-12 DIAGNOSIS — T148XXA Other injury of unspecified body region, initial encounter: Secondary | ICD-10-CM | POA: Diagnosis not present

## 2021-02-12 DIAGNOSIS — E785 Hyperlipidemia, unspecified: Secondary | ICD-10-CM | POA: Diagnosis present

## 2021-02-12 DIAGNOSIS — S12110A Anterior displaced Type II dens fracture, initial encounter for closed fracture: Secondary | ICD-10-CM | POA: Diagnosis present

## 2021-02-12 LAB — GLUCOSE, CAPILLARY
Glucose-Capillary: 109 mg/dL — ABNORMAL HIGH (ref 70–99)
Glucose-Capillary: 140 mg/dL — ABNORMAL HIGH (ref 70–99)
Glucose-Capillary: 89 mg/dL (ref 70–99)
Glucose-Capillary: 109 mg/dL — ABNORMAL HIGH (ref 70–99)

## 2021-02-12 MED ORDER — GADOBUTROL 1 MMOL/ML IV SOLN
6.0000 mL | Freq: Once | INTRAVENOUS | Status: AC | PRN
  Administered 2021-02-12: 6 mL via INTRAVENOUS

## 2021-02-12 NOTE — Progress Notes (Signed)
Cervical delivered and placed on patient.

## 2021-02-12 NOTE — Progress Notes (Signed)
Progress Note   Patient: [name] OVF:[id number] DOB: 23-Jun-1933 DOA: 02/11/2021     0 DOS: the patient was seen and examined on 02/12/2021    Assessment and Plan: * Dens fracture ([place])- (present on admission) MRI brain showing age-indeterminate dens fracture but no stroke.  We will get cervical spine CT scan, order a soft collar and neurosurgical consultation.  Wondering if this is contributing to her falls.  The patient states that she had a bad fall on May 6 which this could have happened at that time.  Unsteady gait Physical therapy 2 days in a row recommending rehab.  Patient initially resistant to going to rehab but now agreeable.  Patient was falling backwards even with just sitting in the chair.  Multiple skin tears- (present on admission) Local wound care.  Keep covered.  No signs of infection currently.  Stroke [facility])- (present on admission) Prior history of completed stroke.  Continue Crestor, Plavix.  Chronic diastolic CHF (congestive heart failure) ([place])- (present on admission) 2D echo on 06/07/2014 showed EF of 50 to 55% with grade 2 diastolic dysfunction.  Patient does not have shortness breath, no leg edema or JVD.  CHF is compensated. -Hold home as needed Lasix  CAD (coronary artery disease)- (present on admission) No chest pain -Plavix, metoprolol, as needed nitroglycerin, Crestor, imdur  Hypothyroidism- (present on admission) - Synthroid  Seizure ([place]) Continue Keppra    Generalized weakness Physical therapy recommending rehab.  Idiopathic gout of multiple sites- (present on admission) Continue allopurinol, colchicine  Diabetes mellitus without complication (HCC) Recent A1c 6.1, well controlled.   Essential hypertension- (present on admission) Continue metoprolol  Hyperlipidemia- (present on admission) Continue Crestor        Subjective: Patient initially this morning said she wanted to leave the hospital soon as possible.  The  patient has been having a lot of falls at home.  Has 2 skin tears.  Her balance has been off.  MRI of the brain showing a age-indeterminate dens fracture.  Physical Exam: Vitals:   02/12/21 0507 02/12/21 0634 02/12/21 0816 02/12/21 1205  BP: (!) 172/66 (!) 143/46 (!) 147/57 (!) 95/54  Pulse: 73 79 77 73  Resp: 16  16 16   Temp: 97.9 F (36.6 C)  98.2 F (36.8 C) 98.1 F (36.7 C)  TempSrc:      SpO2: 96%  97% 98%  Weight:      Height:       Physical Exam HENT:     Head: Normocephalic.     Mouth/Throat:     Pharynx: No oropharyngeal exudate.  Eyes:     General: Lids are normal.     Conjunctiva/sclera: Conjunctivae normal.  Cardiovascular:     Rate and Rhythm: Normal rate and regular rhythm.     Heart sounds: Normal heart sounds, S1 normal and S2 normal.  Pulmonary:     Breath sounds: No decreased breath sounds, wheezing, rhonchi or rales.  Abdominal:     Palpations: Abdomen is soft.     Tenderness: There is no abdominal tenderness.  Musculoskeletal:     Right lower leg: No swelling.     Left lower leg: No swelling.  Skin:    General: Skin is warm.     Comments: Skin tear left elbow and left leg.  Neurological:     Mental Status: She is alert and oriented to person, place, and time.     Comments: Able to move all extremities on her own.  Able to straight  leg raise.     Data Reviewed: Laboratory and radiological data reviewed from today.  MRI of the brain showing a age-indeterminate dens fracture.  Also reviewed notes from my Associate.  Family Communication: Spoke with daughter on the phone  Disposition: Status is: Inpatient Remains inpatient appropriate because: Physical therapy recommending rehab, getting a neurosurgical consultation and a CT scan of the cervical spine for further evaluation of dens fracture.   Planned Discharge Destination: Rehab  The patient has accepted a bed at [facility] but [facility] will not have a bed till Friday.  Of note, her  husband is also at this facility currently.  Author: Loletha Grayer, MD 02/12/2021 3:51 PM  For on call review [email or web address].

## 2021-02-12 NOTE — Progress Notes (Signed)
° °  Chaplain On-Call provided the Advance Directives documents for the patient, in response to [place] from [place].  The patient received the documents and stated that she wants her husband to read them with her for further discussion.  Chaplains are available as needed for additional support.  Chaplain Pollyann Samples M.Div., [facility]

## 2021-02-12 NOTE — Progress Notes (Signed)
Per nursing secretary cervical collar is unavailable until tomorrow morning

## 2021-02-12 NOTE — Progress Notes (Signed)
Physical Therapy Treatment Patient Details Name: [name] MRN: [id number] DOB: July 25, 1933 Today's Date: 02/12/2021   History of Present Illness 88 y.o. female with medical history significant of HTN, HLD, DM , stroke, GERD, thyroid cancer, hypothyroidism, gout, anxiety, rheumatoid arthritis, CAD, carotid artery stenosis, esophagitis, seizure, diverticulitis, dCHF, who presents with fall. MRI does show fracture to base of dens indeterminate age. Negative for CVA.    PT Comments    Patient received in bed. She is agreeable to PT session. Patient requires mod assist for supine to sit. Posterior leaning with attempting to get seated at edge of bed. Cues needed for safe mobility. Min assist for sit to stand and min guard for ambulation with RW in room. She ambulates with slow, shuffle steps. Increased fall risk. Patient will continue to benefit from skilled PT while here to improve safety, strength and functional independence.      Recommendations for follow up therapy are one component of a multi-disciplinary discharge planning process, led by the attending physician.  Recommendations may be updated based on patient status, additional functional criteria and insurance authorization.  Follow Up Recommendations  Skilled nursing-short term rehab (<3 hours/day)     Assistance Recommended at Discharge Frequent or constant Supervision/Assistance  Patient can return home with the following A little help with walking and/or transfers;Assistance with cooking/housework;Assist for transportation;A little help with bathing/dressing/bathroom;Help with stairs or ramp for entrance;Direct supervision/assist for medications management   Equipment Recommendations  None recommended by PT;Other (comment) (TBD)    Recommendations for Other Services       Precautions / Restrictions Precautions Precautions: Fall Restrictions Weight Bearing Restrictions: No     Mobility  Bed Mobility Overal bed  mobility: Needs Assistance Bed Mobility: Supine to Sit, Sit to Supine     Supine to sit: Mod assist Sit to supine: Supervision   General bed mobility comments: assist at trunk for support falling posteriorly with getting to edge of bed    Transfers Overall transfer level: Needs assistance Equipment used: Rolling walker (2 wheels) Transfers: Sit to/from Stand Sit to Stand: Min assist           General transfer comment: cues for safe hand placement with transfers    Ambulation/Gait Ambulation/Gait assistance: Min guard Gait Distance (Feet): 25 Feet Assistive device: Rolling walker (2 wheels) Gait Pattern/deviations: Step-through pattern, Decreased step length - right, Decreased step length - left, Decreased stride length, Shuffle Gait velocity: decreased     General Gait Details: Very slow moving throughout gait which pt reports is her baseline.   Stairs             Wheelchair Mobility    Modified Rankin (Stroke Patients Only)       Balance Overall balance assessment: Needs assistance, History of Falls Sitting-balance support: Bilateral upper extremity supported Sitting balance-Leahy Scale: Poor Sitting balance - Comments: external assist needed to maintain sitting balance. Postural control: Posterior lean Standing balance support: Bilateral upper extremity supported, During functional activity, Reliant on assistive device for balance Standing balance-Leahy Scale: Poor Standing balance comment: requires min assist for initial standing balance. Requires B UE support for balance                            Cognition Arousal/Alertness: Awake/alert Behavior During Therapy: WFL for tasks assessed/performed Overall Cognitive Status: No family/caregiver present to determine baseline cognitive functioning  General Comments: Pt with decreased insight to deficits and needing cues for safety awareness.         Exercises      General Comments        Pertinent Vitals/Pain Pain Assessment Pain Assessment: No/denies pain    Home Living                          Prior Function            PT Goals (current goals can now be found in the care plan section) Acute Rehab PT Goals Patient Stated Goal: patient seems agreeable to SNF now. PT Goal Formulation: With patient Time For Goal Achievement: 02/25/21 Potential to Achieve Goals: Good Progress towards PT goals: Progressing toward goals    Frequency    Min 2X/week      PT Plan Current plan remains appropriate    Co-evaluation              AM-PAC PT "6 Clicks" Mobility   Outcome Measure  Help needed turning from your back to your side while in a flat bed without using bedrails?: A Little Help needed moving from lying on your back to sitting on the side of a flat bed without using bedrails?: A Lot Help needed moving to and from a bed to a chair (including a wheelchair)?: A Little Help needed standing up from a chair using your arms (e.g., wheelchair or bedside chair)?: A Little Help needed to walk in hospital room?: A Little Help needed climbing 3-5 steps with a railing? : A Lot 6 Click Score: 16    End of Session Equipment Utilized During Treatment: Gait belt Activity Tolerance: Patient limited by fatigue Patient left: in bed;with call bell/phone within reach;with bed alarm set Nurse Communication: Mobility status PT Visit Diagnosis: Unsteadiness on feet (R26.81);Other abnormalities of gait and mobility (R26.89);History of falling (Z91.81);Muscle weakness (generalized) (M62.81)     Time: 9977-4142 PT Time Calculation (min) (ACUTE ONLY): 28 min  Charges:  $Gait Training: 23-37 mins                     Khiry Pasquariello, PT, GCS 02/12/21,3:18 PM

## 2021-02-12 NOTE — Assessment & Plan Note (Addendum)
Physical therapy recommending rehab.  Patient will go out to rehab today

## 2021-02-12 NOTE — Progress Notes (Signed)
PT Cancellation Note  Patient Details Name: [name] MRN: [id number] DOB: 1933-11-11   Cancelled Treatment:    Reason Eval/Treat Not Completed: Fatigue/lethargy limiting ability to participate. Patient just finished working with OT and into recliner. Will attempt later today as time allows.    Dwanna Goshert 02/12/2021, 11:35 AM

## 2021-02-12 NOTE — Consult Note (Signed)
Neurosurgery-New Consultation Evaluation 02/12/2021 [name] [id number]  Identifying Statement: Sherri Mcarthy is a 88 y.o. female from [place] [address] with a past medical history of CVA on Plavix, CHF, CAD, hypothyroidism, seizures, gout, DM, HLD, and HTN.  Physician Requesting Consultation: No ref. provider found  History of Present Illness: [name] is a 88 y.o with the above past medical history sending to the hospital after mechanical fall 2 days ago.  She states that she tripped over something in her house.  She admits to multiple falls over the last several months with unknown cause.  She was found to have a age-indeterminate C2 fracture on MRI of her brain.  Patient denies any significant neck pain, occipital cervical headaches, upper or lower extremity numbness or tingling, spasticity, or radicular pain.  She denies having been diagnosed with this fracture in the past however she has a strong feeling that she did this after a fall outside of [employer] over the summer.  At baseline she ablates with the assistance of a rolling walker.  Past Medical History:  Past Medical History:  Diagnosis Date   CAD (coronary artery disease)    a. lexiscan 08/2013: nl wall motion, no ischemia, EF 50-55%; b. cardiac cath 05/31/2014: mLAD 90%, dLAD 50%, dLCx 60%, 1st Mrg 80%, pRCA 60% s/p PCI/DES, mRCA 1st lesion 70%, mRCA 2nd 95% s/p PCI/long DES to cover entire mRCA, RPLB 40%;  c. 05/2014 Staged PCI of LAD w/ 2.5 x 15 mm Xience [place] DES.   Carotid artery disease (HCC)    Chronic anxiety    Diverticulosis    Esophagitis    GERD (gastroesophageal reflux disease)    History of colon polyps    History of echocardiogram    a. 08/2013: normal LVSF, nl RVSF, no valvular regurgitation or stenosis    Hyperlipidemia    Hypertension    Hypothyroidism    Mild reactive airways disease    Osteoarthritis    PONV (postoperative nausea and vomiting)    Rheumatoid arthritis ([place])     Stroke ([place])    a. 05/2014 pt c/o garbled speech following cath 5/19. MRI/A 5/26 showed left caudate infarct->conservative mgmt per neuro.   Thyroid cancer ([place])    a. s/p right sided hemi-thyroidectomy; b. planning for radioactive iodine tx; c. followed by Dr. Marcelene [place]   Type 2 diabetes mellitus ([place])     Social History: Social History   Socioeconomic History   Marital status: Married    Spouse name: Not on file   Number of children: Not on file   Years of education: Not on file   Highest education level: Not on file  Occupational History   Occupation: retired  Tobacco Use   Smoking status: Never   Smokeless tobacco: Never  Substance and Sexual Activity   Alcohol use: No   Drug use: No   Sexual activity: Not Currently  Other Topics Concern   Not on file  Social History Narrative   Not on file   Social Determinants of Health   Financial Resource Strain: Not on file  Food Insecurity: Not on file  Transportation Needs: Not on file  Physical Activity: Not on file  Stress: Not on file  Social Connections: Not on file  Intimate Partner Violence: Not on file   Living arrangements (living alone, with partner): With her husband his currently hospitalized.  Family History: Family History  Problem Relation Age of Onset   Heart disease Sister  stent placed x 3     Review of Systems:  Review of Systems - General ROS: Negative Psychological ROS: Negative Ophthalmic ROS: Negative ENT ROS: Negative Hematological and Lymphatic ROS: Negative  Endocrine ROS: Negative Respiratory ROS: Negative Cardiovascular ROS: Negative Gastrointestinal ROS: Negative Genito-Urinary ROS: Negative Musculoskeletal ROS: Negative Neurological ROS: Negative Dermatological ROS: Negative  Physical Exam: BP (!) 95/54 (BP Location: Right Arm)    Pulse 73    Temp 98.1 F (36.7 C)    Resp 16    Ht 5\' 1"  (1.549 m)    Wt 63.6 kg    SpO2 98%    BMI 26.49 kg/m  Body mass index is 26.49 kg/m.  Body surface area is 1.65 meters squared. General appearance: Alert, cooperative, in no acute distress Head: Normocephalic, atraumatic Eyes: Normal, EOM intact Oropharynx: Moist without lesions Neck: Supple, no tenderness Heart: Normal, regular rate and rhythm, without murmur Lungs: Clear to auscultation, good air exchange Abdomen: Soft, nondistended Ext: multiple areas of bruising and a large skin tear on the LLE  Neurologic exam:  Mental status: alertness: alert, orientation: person, place, time, affect: normal Speech: fluent and clear Cranial nerves:  CN II-XII grossly intact Motor:strength symmetric 5/5, normal muscle mass and tone in all extremities and no pronator drift Sensory: intact to light touch in all extremities Reflexes: 1+ and symmetric bilaterally for arms and legs. Negative Hoffman's, negative clonus  Coordination: intact finger to nose  Laboratory: Results for orders placed or performed during the hospital encounter of 02/11/21  Resp Panel by RT-PCR (Flu A&B, Covid)   Specimen: Nasopharyngeal(NP) swabs in vial transport medium  Result Value Ref Range   SARS Coronavirus 2 by RT PCR NEGATIVE NEGATIVE   Influenza A by PCR NEGATIVE NEGATIVE   Influenza B by PCR NEGATIVE NEGATIVE  CBC with Differential/Platelet  Result Value Ref Range   WBC 5.7 4.0 - 10.5 K/uL   RBC 4.15 3.87 - 5.11 MIL/uL   Hemoglobin 12.2 12.0 - 15.0 g/dL   HCT 38.5 36.0 - 46.0 %   MCV 92.8 80.0 - 100.0 fL   MCH 29.4 26.0 - 34.0 pg   MCHC 31.7 30.0 - 36.0 g/dL   RDW 13.5 11.5 - 15.5 %   Platelets 219 150 - 400 K/uL   nRBC 0.0 0.0 - 0.2 %   Neutrophils Relative % 67 %   Neutro Abs 3.8 1.7 - 7.7 K/uL   Lymphocytes Relative 20 %   Lymphs Abs 1.1 0.7 - 4.0 K/uL   Monocytes Relative 8 %   Monocytes Absolute 0.5 0.1 - 1.0 K/uL   Eosinophils Relative 4 %   Eosinophils Absolute 0.3 0.0 - 0.5 K/uL   Basophils Relative 1 %   Basophils Absolute 0.0 0.0 - 0.1 K/uL   Immature Granulocytes 0 %    Abs Immature Granulocytes 0.02 0.00 - 0.07 K/uL  Basic metabolic panel  Result Value Ref Range   Sodium 138 135 - 145 mmol/L   Potassium 4.1 3.5 - 5.1 mmol/L   Chloride 105 98 - 111 mmol/L   CO2 25 22 - 32 mmol/L   Glucose, Bld 110 (H) 70 - 99 mg/dL   BUN 21 8 - 23 mg/dL   Creatinine, Ser 0.87 0.44 - 1.00 mg/dL   Calcium 9.4 8.9 - 10.3 mg/dL   GFR, Estimated >60 >60 mL/min   Anion gap 8 5 - 15  Urinalysis, Routine w reflex microscopic  Result Value Ref Range   Color, Urine YELLOW YELLOW  APPearance CLEAR CLEAR   Specific Gravity, Urine 1.010 1.005 - 1.030   pH 5.5 5.0 - 8.0   Glucose, UA NEGATIVE NEGATIVE mg/dL   Hgb urine dipstick NEGATIVE NEGATIVE   Bilirubin Urine NEGATIVE NEGATIVE   Ketones, ur TRACE (A) NEGATIVE mg/dL   Protein, ur 100 (A) NEGATIVE mg/dL   Nitrite NEGATIVE NEGATIVE   Leukocytes,Ua TRACE (A) NEGATIVE  Urinalysis, Microscopic (reflex)  Result Value Ref Range   RBC / HPF 0-5 0 - 5 RBC/hpf   WBC, UA 0-5 0 - 5 WBC/hpf   Bacteria, UA RARE (A) NONE SEEN   Squamous Epithelial / LPF 0-5 0 - 5  TSH  Result Value Ref Range   TSH 5.753 (H) 0.350 - 4.500 uIU/mL  T4, free  Result Value Ref Range   Free T4 1.30 (H) 0.61 - 1.12 ng/dL  Glucose, capillary  Result Value Ref Range   Glucose-Capillary 142 (H) 70 - 99 mg/dL  Glucose, capillary  Result Value Ref Range   Glucose-Capillary 109 (H) 70 - 99 mg/dL  Glucose, capillary  Result Value Ref Range   Glucose-Capillary 140 (H) 70 - 99 mg/dL  CBG monitoring, ED  Result Value Ref Range   Glucose-Capillary 100 (H) 70 - 99 mg/dL  CBG monitoring, ED  Result Value Ref Range   Glucose-Capillary 105 (H) 70 - 99 mg/dL  Troponin I (High Sensitivity)  Result Value Ref Range   Troponin I (High Sensitivity) 16 <18 ng/L   I personally reviewed labs  Imaging: Results for orders placed during the hospital encounter of 02/11/21  MR BRAIN W WO CONTRAST  Narrative CLINICAL DATA:  Weakness, multiple falls, history of  seizures  EXAM: MRI HEAD WITHOUT AND WITH CONTRAST  TECHNIQUE: Multiplanar, multiecho pulse sequences of the brain and surrounding structures were obtained without and with intravenous contrast.  CONTRAST:  67mL GADAVIST GADOBUTROL 1 MMOL/ML IV SOLN  COMPARISON:  CT head dated 1 day prior, MR head 08/25/2020  FINDINGS: Brain: There is no evidence of acute intracranial hemorrhage, extra-axial fluid collection, or acute infarct.  There is mild global parenchymal volume loss with prominence of the extra-axial CSF spaces and ventricular system, unchanged since the prior MRI. The ventricular system is stable in size and configuration. There are patchy and confluent foci of FLAIR signal abnormality throughout the subcortical and periventricular white matter likely reflecting sequela of moderate chronic white matter microangiopathy.  There is no structural or migration abnormality. The hippocampi are symmetric with no signal abnormality. There is no abnormal enhancement or mass lesion. There is no midline shift.  Vascular: Normal flow voids.  Skull and upper cervical spine: There is bulky degenerative pannus about the dens with narrowing of the craniocervical junction, unchanged. There is a fracture through the base of the dens which was not present on the prior brain MRI from 07/23/2020, 16-25, 9-13 (the CTs and MRI from 08/25/2020 and 02/11/2021 do not include the dens within the field of view).  Sinuses/Orbits: The paranasal sinuses are clear. Bilateral lens implants are in place. The globes and orbits are otherwise unremarkable.  Other: None.  IMPRESSION: 1. No acute intracranial pathology or epileptogenic focus identified. 2. Fracture of the base of the dens of indeterminate age. Recommend CT of the cervical spine for further evaluation.   Electronically Signed By: Valetta Mole M.D. On: 02/12/2021 14:18  I personally reviewed radiology studies to  include:   Impression/Plan:     1.  Diagnosis: Age indeterminate C2 fracture without neck pain.  2.  Plan - agree with the plan to obtain CT cervical spine for further evaluation of C2 fracture. -I have a low suspicion that this is the origin of her imbalance as she does not have any significant neck pain or any symptoms concerning for cervical myelopathy. - remainder of plan per primary team.  - Please feel free to page with any questions or concerns.   Cooper Render PA-C Neurosurgery

## 2021-02-12 NOTE — Plan of Care (Signed)
Pt alert and oriented x 4. Pt had 2 episodes of confusion and thought she was at home during overnight. Tylenol given x 1 for headache and Hydralazine x 1 for sbp 172.  Problem: Clinical Measurements: Goal: Ability to maintain clinical measurements within normal limits will improve 02/12/2021 0528 by Nicholes Calamity, RN Outcome: Not Progressing   Problem: Education: Goal: Knowledge of General Education information will improve Description: Including pain rating scale, medication(s)/side effects and non-pharmacologic comfort measures 02/12/2021 0528 by Nicholes Calamity, RN Outcome: Progressing  Problem: Health Behavior/Discharge Planning: Goal: Ability to manage health-related needs will improve 02/12/2021 0528 by Nicholes Calamity, RN Outcome: Progressing    Problem: Clinical Measurements: Goal: Will remain free from infection 02/12/2021 0528 by Nicholes Calamity, RN Outcome: Progressing  Goal: Diagnostic test results will improve 02/12/2021 0528 by Nicholes Calamity, RN Outcome: Progressing  Goal: Respiratory complications will improve 02/12/2021 0528 by Nicholes Calamity, RN Outcome: Progressing  Goal: Cardiovascular complication will be avoided 02/12/2021 0528 by Nicholes Calamity, RN Outcome: Progressing    Problem: Activity: Goal: Risk for activity intolerance will decrease 02/12/2021 0528 by Nicholes Calamity, RN Outcome: Progressing    Problem: Pain Managment: Goal: General experience of comfort will improve 02/12/2021 0528 by Nicholes Calamity, RN Outcome: Progressing 02/11/2021 2226 by Nicholes Calamity, RN    Problem: Safety: Goal: Ability to remain free from injury will improve 02/12/2021 0528 by Nicholes Calamity, RN Outcome: Progressing    Problem: Skin Integrity: Goal: Risk for impaired skin integrity will decrease 02/12/2021 0528 by Nicholes Calamity, RN Outcome: Progressing

## 2021-02-12 NOTE — NC FL2 (Addendum)
[place] LEVEL OF CARE SCREENING TOOL     IDENTIFICATION  Patient Name: [place]: 05/07/1933 Sex: female Admission Date (Current Location): 02/11/2021  [facility] and [place] Number:  Engineering geologist and Address:  [facility], [address], [place], [place] [address]      Provider Number: [id number]  Attending Physician Name and Address:  Loletha Grayer, MD  Relative Name and Phone Number:       Current Level of Care: Hospital Recommended Level of Care: [place] Prior Approval Number:  [id number]  Date Approved/Denied:   PASRR Number:    Discharge Plan: SNF    Current Diagnoses: Patient Active Problem List   Diagnosis Date Noted   Fall 02/11/2021   Hypothyroidism 02/11/2021   CAD (coronary artery disease) 02/11/2021   Multiple skin tears 02/11/2021   Chronic diastolic CHF (congestive heart failure) ([place]) 02/11/2021   Seizure ([place]) 08/25/2020   Diverticulitis 07/24/2020   Intractable nausea and vomiting 07/23/2020   Generalized weakness 07/23/2020   Achilles tendon contracture, bilateral 06/29/2017   Acute bilateral low back pain without sciatica 03/17/2016   Idiopathic chronic gout of multiple sites without tophus 03/17/2016   Idiopathic gout of multiple sites 11/27/2015   Cellulitis 05/15/2015   Bilateral leg edema 02/08/2015   Chronic anxiety 11/22/2014   Cerebrovascular accident (CVA) ([place]) 07/25/2014   Arterial vascular disease 07/25/2014   Swelling of right lower extremity 06/22/2014   Diabetes mellitus without complication ([place])    Stroke ([place]) 06/08/2014   Anemia 06/07/2014   Unstable angina ([place]) 06/06/2014   Carotid artery disease ([place])    Atherosclerosis of native coronary artery with stable angina pectoris ([place])    Hyperlipidemia    Essential hypertension    Angina pectoris ([place]) 05/31/2014   SOB (shortness of breath) 04/11/2014   Palpitations 04/11/2014    Other fatigue 04/11/2014   Thyroid cancer ([place]) 04/11/2014    Orientation RESPIRATION BLADDER Height & Weight     Time, Self, Situation, Place  Normal Continent Weight: 140 lb 3.4 oz (63.6 kg) Height:  5\' 1"  (154.9 cm)  BEHAVIORAL SYMPTOMS/MOOD NEUROLOGICAL BOWEL NUTRITION STATUS      Continent Diet (heart healthy/carb modified, thin liquids)  AMBULATORY STATUS COMMUNICATION OF NEEDS Skin   Extensive Assist Verbally Other (Comment) (open wound right elbow, Impregnated gauze (bismuth); Gauze . Open wound left pretibial, Impregnated gauze (bismuth); Gauze)                       Personal Care Assistance Level of Assistance  Bathing, Feeding, Dressing Bathing Assistance: Limited assistance Feeding assistance: Independent Dressing Assistance: Limited assistance     Functional Limitations Info  Sight, Hearing, Speech Sight Info: Adequate Hearing Info: Adequate Speech Info: Adequate    SPECIAL CARE FACTORS FREQUENCY  PT (By licensed PT), OT (By licensed OT)     PT Frequency: 5x OT Frequency: 5x            Contractures Contractures Info: Not present    Additional Factors Info  Code Status, Allergies Code Status Info: full code Allergies Info: Ciprofloxacin, Codeine, Metronidazole, Other, Sulfa Antibiotics, Neomycin, Neomycin-bacitracin Zn-polymyx, Bacitracin-polymyxin B, Celecoxib, Petrolatum-zinc Oxide, Statins, Vioxx (Rofecoxib)           Current Medications (02/12/2021):  This is the current hospital active medication list Current Facility-Administered Medications  Medication Dose Route Frequency Provider Last Rate Last Admin   acetaminophen (TYLENOL) tablet 650 mg  650 mg Oral  Q6H PRN Ivor Costa, MD   650 mg at 02/12/21 1312   allopurinol (ZYLOPRIM) tablet 100 mg  100 mg Oral BID Ivor Costa, MD   100 mg at 02/12/21 1105   clopidogrel (PLAVIX) tablet 75 mg  75 mg Oral Daily Ivor Costa, MD   75 mg at 02/12/21 1104   colchicine tablet 0.6 mg  0.6 mg Oral Daily PRN  Ivor Costa, MD       enoxaparin (LOVENOX) injection 40 mg  40 mg Subcutaneous Q24H Ivor Costa, MD   40 mg at 02/12/21 1105   hydrALAZINE (APRESOLINE) injection 5 mg  5 mg Intravenous Q2H PRN Ivor Costa, MD   5 mg at 02/12/21 9735   insulin aspart (novoLOG) injection 0-5 Units  0-5 Units Subcutaneous QHS Ivor Costa, MD       insulin aspart (novoLOG) injection 0-9 Units  0-9 Units Subcutaneous TID WC Ivor Costa, MD   1 Units at 02/12/21 1310   isosorbide mononitrate (IMDUR) 24 hr tablet 30 mg  30 mg Oral Daily Ivor Costa, MD   30 mg at 02/12/21 1104   levETIRAcetam (KEPPRA) tablet 250 mg  250 mg Oral BID Ivor Costa, MD   250 mg at 02/12/21 1105   levothyroxine (SYNTHROID) tablet 75 mcg  75 mcg Oral Daily Ivor Costa, MD   75 mcg at 02/12/21 3299   LORazepam (ATIVAN) injection 1 mg  1 mg Intravenous Q2H PRN Ivor Costa, MD       metoprolol succinate (TOPROL-XL) 24 hr tablet 50 mg  50 mg Oral Daily Ivor Costa, MD   50 mg at 02/12/21 1104   nitroGLYCERIN (NITROSTAT) SL tablet 0.4 mg  0.4 mg Sublingual Q5 min PRN Ivor Costa, MD       ondansetron [facility]) injection 4 mg  4 mg Intravenous Q8H PRN Ivor Costa, MD       oxyCODONE-acetaminophen (PERCOCET/ROXICET) 5-325 MG per tablet 1 tablet  1 tablet Oral Q4H PRN Ivor Costa, MD   1 tablet at 02/11/21 1130   rosuvastatin (CRESTOR) tablet 5 mg  5 mg Oral Daily Ivor Costa, MD   5 mg at 02/12/21 1104     Discharge Medications: Please see discharge summary for a list of discharge medications.  Relevant Imaging Results:  Relevant Lab Results:   Additional Information MEQ:[id number]  Eileen Stanford, LCSW

## 2021-02-12 NOTE — Care Management Obs Status (Signed)
[place] NOTIFICATION   Patient Details  Name: [name] MRN: [id number] Date of Birth: 24-Sep-1933   Medicare Observation Status Notification Given:  Yes    Gerrianne Scale Elija Mccamish, LCSW 02/12/2021, 2:33 PM

## 2021-02-12 NOTE — Progress Notes (Signed)
Occupational Therapy Treatment Patient Details Name: [name] MRN: [id number] DOB: [birth year] Today's Date: 02/12/2021   History of present illness 88 y.o. female with medical history significant of HTN, HLD, DM , stroke, GERD, thyroid cancer, hypothyroidism, gout, anxiety, rheumatoid arthritis, CAD, carotid artery stenosis, esophagitis, seizure, diverticulitis, dCHF, who presents with fall.   OT comments  Upon entering the room, pt supine in bed with no c/o pain and agreeable to OT intervention. Pt reports wanting to return home. Pt unable to get to EOB without physical assistance. Once seated on EOB, pt with significant posterior bias with static sitting requiring assistance for safety. Pt making 5 attempts to stand from standard height bed but again unable without assistance from therapist to stand. Pt with decreased safety awareness and decreased insight to deficits. Pt ambulates with very slow pace 20' within the room with min guard and use of RW to door and then recliner chair. OT discussed with pt who OT continues to recommend SNF secondary to pt living alone and does not have 24/7 support/assist for safety awareness. Pt remains seated in recliner chair at end of session. All needs within reach.    Recommendations for follow up therapy are one component of a multi-disciplinary discharge planning process, led by the attending physician.  Recommendations may be updated based on patient status, additional functional criteria and insurance authorization.    Follow Up Recommendations  Skilled nursing-short term rehab (<3 hours/day)    Assistance Recommended at Discharge Frequent or constant Supervision/Assistance  Patient can return home with the following  A lot of help with walking and/or transfers;A lot of help with bathing/dressing/bathroom;Assist for transportation;Assistance with cooking/housework;Direct supervision/assist for medications management;Direct supervision/assist for  financial management;Help with stairs or ramp for entrance   Equipment Recommendations  BSC/3in1       Precautions / Restrictions Precautions Precautions: Fall       Mobility Bed Mobility Overal bed mobility: Needs Assistance Bed Mobility: Supine to Sit     Supine to sit: Min assist     General bed mobility comments: assist at trunk for support    Transfers Overall transfer level: Needs assistance Equipment used: Rolling walker (2 wheels) Transfers: Sit to/from Stand Sit to Stand: Min assist           General transfer comment: Multiple attempts to stand without assist but not able. Pt needing lifting assistance to stand from EOB     Balance Overall balance assessment: Needs assistance Sitting-balance support: Feet unsupported, Bilateral upper extremity supported Sitting balance-Leahy Scale: Poor Sitting balance - Comments: Pt needing assist for static sitting balance   Standing balance support: During functional activity, Reliant on assistive device for balance Standing balance-Leahy Scale: Poor                             ADL either performed or assessed with clinical judgement    Extremity/Trunk Assessment Upper Extremity Assessment Upper Extremity Assessment: Generalized weakness   Lower Extremity Assessment Lower Extremity Assessment: Generalized weakness        Vision Patient Visual Report: No change from baseline            Cognition Arousal/Alertness: Awake/alert Behavior During Therapy: WFL for tasks assessed/performed Overall Cognitive Status: No family/caregiver present to determine baseline cognitive functioning  General Comments: Pt with decreased insight to deficits and needing cues for safety awareness.                   Pertinent Vitals/ Pain       Pain Assessment Pain Assessment: No/denies pain         Frequency  Min 2X/week        Progress Toward  Goals  OT Goals(current goals can now be found in the care plan section)  Progress towards OT goals: Progressing toward goals  Acute Rehab OT Goals Patient Stated Goal: to go home OT Goal Formulation: With patient Time For Goal Achievement: 02/25/21  Plan Discharge plan remains appropriate;Frequency remains appropriate       AM-PAC OT "6 Clicks" Daily Activity     Outcome Measure   Help from another person eating meals?: None Help from another person taking care of personal grooming?: A Little Help from another person toileting, which includes using toliet, bedpan, or urinal?: A Lot Help from another person bathing (including washing, rinsing, drying)?: A Lot Help from another person to put on and taking off regular upper body clothing?: A Little Help from another person to put on and taking off regular lower body clothing?: A Lot 6 Click Score: 16    End of Session Equipment Utilized During Treatment: Rolling walker (2 wheels)  OT Visit Diagnosis: Unsteadiness on feet (R26.81);Repeated falls (R29.6);Muscle weakness (generalized) (M62.81)   Activity Tolerance Patient tolerated treatment well   Patient Left in bed;with call bell/phone within reach   Nurse Communication Mobility status        Time: 8206-0156 OT Time Calculation (min): 23 min  Charges: OT General Charges $OT Visit: 1 Visit OT Treatments $Therapeutic Activity: 23-37 mins  Darleen Crocker, MS, OTR/L , CBIS ascom [phone]  02/12/21, 12:52 PM

## 2021-02-12 NOTE — TOC Initial Note (Signed)
Transition of Care [facility]) - Initial/Assessment Note    Patient Details  Name: [name] MRN: [id number] Date of Birth: 05-06-1933  Transition of Care [facility]) CM/SW Contact:    Eileen Stanford, LCSW Phone Number: 02/12/2021, 3:10 PM  Clinical Narrative:   CSW spoke with pt at bedside. Pt states she is agreeable to go to SNF if she can go where her husband is. Pt states her spouse is at [facility]. CSW has reached out to [place] at [facility] and she states they won't have a bed until Friday. MD notified. Pending MRI.                 Expected Discharge Plan: Skilled Nursing Facility Barriers to Discharge: Continued Medical Work up   Patient Goals and CMS Choice Patient states their goals for this hospitalization and ongoing recovery are:: to go to snf with husband   Choice offered to / list presented to : Patient  Expected Discharge Plan and Services Expected Discharge Plan: [place] In-house Referral: Clinical Social Work   Post Acute Care Choice: [place] Living arrangements for the past 2 months: [place]                                      Prior Living Arrangements/Services Living arrangements for the past 2 months: Single Family Home Lives with:: Spouse Patient language and need for interpreter reviewed:: Yes Do you feel safe going back to the place where you live?: Yes      Need for Family Participation in Patient Care: Yes (Comment) Care giver support system in place?: Yes (comment)   Criminal Activity/Legal Involvement Pertinent to Current Situation/Hospitalization: No - Comment as needed  Activities of Daily Living Home Assistive Devices/Equipment: Cane (specify quad or straight), Walker (specify type) ADL Screening (condition at time of admission) Patient's cognitive ability adequate to safely complete daily activities?: Yes Is the patient deaf or have difficulty hearing?: No Does the patient  have difficulty seeing, even when wearing glasses/contacts?: No Does the patient have difficulty concentrating, remembering, or making decisions?: No Patient able to express need for assistance with ADLs?: Yes Does the patient have difficulty dressing or bathing?: No Independently performs ADLs?: Yes (appropriate for developmental age) Communication: Independent Dressing (OT): Independent Grooming: Independent Feeding: Independent Bathing: Independent Toileting: Independent In/Out Bed: Independent Walks in Home: Independent Does the patient have difficulty walking or climbing stairs?: Yes Weakness of Legs: Both Weakness of Arms/Hands: Both  Permission Sought/Granted Permission sought to share information with : Family Supports Permission granted to share information with : Yes, Verbal Permission Granted  Share Information with NAME: Amy  Permission granted to share info w AGENCY: Dietitian granted to share info w Relationship: daughter     Emotional Assessment Appearance:: Appears stated age Attitude/Demeanor/Rapport: Engaged Affect (typically observed): Accepting Orientation: : Oriented to Self, Oriented to Place, Oriented to  Time, Oriented to Situation Alcohol / Substance Use: Not Applicable Psych Involvement: No (comment)  Admission diagnosis:  Fall [W19.XXXA] Generalized weakness [R53.1] Frequent falls [R29.6] Noninfected skin tear of left leg, initial encounter [A83.419Q] Skin tear of left elbow without complication, initial encounter [S51.012A] Patient Active Problem List   Diagnosis Date Noted   Fall 02/11/2021   Hypothyroidism 02/11/2021   CAD (coronary artery disease) 02/11/2021   Multiple skin tears 02/11/2021   Chronic diastolic CHF (congestive heart failure) ([place])  02/11/2021   Seizure ([place]) 08/25/2020   Diverticulitis 07/24/2020   Intractable nausea and vomiting 07/23/2020   Generalized weakness 07/23/2020   Achilles tendon contracture,  bilateral 06/29/2017   Acute bilateral low back pain without sciatica 03/17/2016   Idiopathic chronic gout of multiple sites without tophus 03/17/2016   Idiopathic gout of multiple sites 11/27/2015   Cellulitis 05/15/2015   Bilateral leg edema 02/08/2015   Chronic anxiety 11/22/2014   Cerebrovascular accident (CVA) ([place]) 07/25/2014   Arterial vascular disease 07/25/2014   Swelling of right lower extremity 06/22/2014   Diabetes mellitus without complication ([place])    Stroke ([place]) 06/08/2014   Anemia 06/07/2014   Unstable angina ([place]) 06/06/2014   Carotid artery disease [facility])    Atherosclerosis of native coronary artery with stable angina pectoris [facility])    Hyperlipidemia    Essential hypertension    Angina pectoris ([place]) 05/31/2014   SOB (shortness of breath) 04/11/2014   Palpitations 04/11/2014   Other fatigue 04/11/2014   Thyroid cancer ([place]) 04/11/2014   PCP:  Idelle Crouch, MD Pharmacy:   [id number] [address], [place] AT [address] [place] [address] Phone: [phone] Fax: [phone]  [facility] - [address], [place] [address] Phone: [phone] Fax: [phone]  [facility] [place], [place] [address] [place] [address] Phone: [phone] Fax: [phone]  [id number] [address], [place] [address] Phone: [phone] Fax: [phone]  [facility] ([place], [place] [facility] [address] [place] [address] Phone: [phone] Fax: [phone]     Social Determinants of Health (SDOH) Interventions    Readmission Risk Interventions No flowsheet data found.

## 2021-02-12 NOTE — Clinical Social Work Note (Signed)
RE:  [name]   Date of Birth:   03-Jun-2035___________  Date:  02/12/21      To Whom It May Concern:  Please be advised that the above-named patient will require a short-term nursing home stay - anticipated 30 days or less for rehabilitation and strengthening.  The plan is for return home.    MD electronic signature noted below

## 2021-02-12 NOTE — Assessment & Plan Note (Addendum)
MRI brain showing age-indeterminate dens fracture but no stroke.  No neurosurgical intervention needed.  Fracture likely old in nature.

## 2021-02-13 ENCOUNTER — Inpatient Hospital Stay: Payer: Medicare Other

## 2021-02-13 DIAGNOSIS — S12100S Unspecified displaced fracture of second cervical vertebra, sequela: Secondary | ICD-10-CM

## 2021-02-13 LAB — GLUCOSE, CAPILLARY
Glucose-Capillary: 104 mg/dL — ABNORMAL HIGH (ref 70–99)
Glucose-Capillary: 101 mg/dL — ABNORMAL HIGH (ref 70–99)
Glucose-Capillary: 124 mg/dL — ABNORMAL HIGH (ref 70–99)
Glucose-Capillary: 123 mg/dL — ABNORMAL HIGH (ref 70–99)

## 2021-02-13 NOTE — Progress Notes (Signed)
Physical Therapy Treatment Patient Details Name: [name] MRN: [id number] DOB: 03-Jan-1934 Today's Date: 02/13/2021   History of Present Illness 88 y.o. female with medical history significant of HTN, HLD, DM , stroke, GERD, thyroid cancer, hypothyroidism, gout, anxiety, rheumatoid arthritis, CAD, carotid artery stenosis, esophagitis, seizure, diverticulitis, dCHF, who presents with fall. MRI does show fracture to base of dens indeterminate age. Negative for CVA.    PT Comments    Patient received sleeping in bed. Rouses easily. She is agreeable to PT session. Patient requires min assist for supine to sit. Decreased safety awareness with mobility. Min assist for sit to stand and min guard for ambulation of 80 feet with RW in hallway. Patient ambulates with slow, shuffle gait. She will continue to benefit from skilled PT while here to improve mobility, independence and safety.     Recommendations for follow up therapy are one component of a multi-disciplinary discharge planning process, led by the attending physician.  Recommendations may be updated based on patient status, additional functional criteria and insurance authorization.  Follow Up Recommendations  Skilled nursing-short term rehab (<3 hours/day)     Assistance Recommended at Discharge Frequent or constant Supervision/Assistance  Patient can return home with the following A little help with walking and/or transfers;Assistance with cooking/housework;Assist for transportation;A little help with bathing/dressing/bathroom;Help with stairs or ramp for entrance;Direct supervision/assist for medications management   Equipment Recommendations  None recommended by PT    Recommendations for Other Services       Precautions / Restrictions Precautions Precautions: Fall Restrictions Weight Bearing Restrictions: No     Mobility  Bed Mobility Overal bed mobility: Needs Assistance Bed Mobility: Supine to Sit     Supine to  sit: Min assist     General bed mobility comments: min assist to raise trunk to seated position    Transfers Overall transfer level: Needs assistance Equipment used: Rolling walker (2 wheels) Transfers: Sit to/from Stand Sit to Stand: Min assist                Ambulation/Gait Ambulation/Gait assistance: Min guard Gait Distance (Feet): 80 Feet Assistive device: Rolling walker (2 wheels) Gait Pattern/deviations: Step-through pattern, Decreased step length - left, Decreased step length - right, Shuffle Gait velocity: decreased     General Gait Details: Very slow moving throughout gait which pt reports is her baseline.   Stairs             Wheelchair Mobility    Modified Rankin (Stroke Patients Only)       Balance Overall balance assessment: Needs assistance Sitting-balance support: Feet supported Sitting balance-Leahy Scale: Fair Sitting balance - Comments: improved static sitting from session yesterday   Standing balance support: Bilateral upper extremity supported, During functional activity, Reliant on assistive device for balance Standing balance-Leahy Scale: Fair Standing balance comment: requires min assist for initial standing balance. Requires B UE support for balance                            Cognition Arousal/Alertness: Awake/alert Behavior During Therapy: WFL for tasks assessed/performed Overall Cognitive Status: No family/caregiver present to determine baseline cognitive functioning                                 General Comments: Pt with decreased insight to deficits and needing cues for safety awareness.        Exercises  General Comments        Pertinent Vitals/Pain Pain Assessment Pain Assessment: No/denies pain    Home Living                          Prior Function            PT Goals (current goals can now be found in the care plan section) Acute Rehab PT Goals Patient Stated  Goal: patient seems agreeable to SNF now. PT Goal Formulation: With patient Time For Goal Achievement: 02/25/21 Potential to Achieve Goals: Good Progress towards PT goals: Progressing toward goals    Frequency    Min 2X/week      PT Plan Current plan remains appropriate    Co-evaluation              AM-PAC PT "6 Clicks" Mobility   Outcome Measure  Help needed turning from your back to your side while in a flat bed without using bedrails?: A Little Help needed moving from lying on your back to sitting on the side of a flat bed without using bedrails?: A Little Help needed moving to and from a bed to a chair (including a wheelchair)?: A Little Help needed standing up from a chair using your arms (e.g., wheelchair or bedside chair)?: A Little Help needed to walk in hospital room?: A Little Help needed climbing 3-5 steps with a railing? : A Lot 6 Click Score: 17    End of Session Equipment Utilized During Treatment: Gait belt Activity Tolerance: Patient tolerated treatment well Patient left: in chair;with call bell/phone within reach Nurse Communication: Mobility status PT Visit Diagnosis: Unsteadiness on feet (R26.81);Other abnormalities of gait and mobility (R26.89);History of falling (Z91.81);Muscle weakness (generalized) (M62.81)     Time: 1250-1310 PT Time Calculation (min) (ACUTE ONLY): 20 min  Charges:  $Gait Training: 8-22 mins                     Delinda Malan, PT, GCS 02/13/21,3:19 PM

## 2021-02-13 NOTE — Progress Notes (Signed)
PT Cancellation Note  Patient Details Name: [name] MRN: [id number] DOB: 1933/09/28   Cancelled Treatment:    Reason Eval/Treat Not Completed: Patient at procedure or test/unavailable. Will re-attempt later as time allows.    Megha Agnes 02/13/2021, 1:23 PM

## 2021-02-13 NOTE — Progress Notes (Signed)
°  Progress Note   Patient: [name] JAS:[id number] DOB: [birth year] DOA: 02/11/2021     1 DOS: the patient was seen and examined on 02/13/2021    Assessment and Plan: * Unsteady gait Physical therapy recommending rehab.  [facility] will have a bed for her for tomorrow.  We will check orthostatic vital signs.  Dens fracture ([place])- (present on admission) MRI brain showing age-indeterminate dens fracture but no stroke.  No neurosurgical intervention needed.  Fracture likely old in nature.  Multiple skin tears- (present on admission) Local wound care.  Keep covered.  No signs of infection currently.  Stroke [facility])- (present on admission) Prior history of completed stroke.  Continue Crestor, Plavix.  Chronic diastolic CHF (congestive heart failure) ([place])- (present on admission) 2D echo on 06/07/2014 showed EF of 50 to 55%.  Currently no signs of heart failure.  CAD (coronary artery disease)- (present on admission) Continue Plavix, metoprolol, as needed nitroglycerin, Crestor, imdur  Hypothyroidism- (present on admission) - Synthroid  Seizure ([place]) Continue Keppra    Generalized weakness Physical therapy recommending rehab.  Idiopathic gout of multiple sites- (present on admission) Continue allopurinol, colchicine  Diabetes mellitus without complication (HCC) Recent A1c 6.1, well controlled.   Essential hypertension- (present on admission) Continue metoprolol Will check orthostatic vitals  Hyperlipidemia- (present on admission) Continue Crestor        Subjective: Patient feeling better.  Offers no complaints.  I advised that she can use the neck brace as needed if her neck is bothering her but at this point the fracture is likely old and can do normal activity.  Physical Exam: Vitals:   02/12/21 2009 02/13/21 0442 02/13/21 0819 02/13/21 1153  BP: (!) 114/48 (!) 118/50 (!) 141/67 (!) 117/56  Pulse: 71 76 75 64  Resp: 16 18 18 18   Temp: 98.3 F (36.8  C) 98.2 F (36.8 C) 98.2 F (36.8 C) (!) 97.5 F (36.4 C)  TempSrc:  Oral    SpO2: 98% 96% 98% 100%  Weight:  60.6 kg    Height:       Physical Exam HENT:     Head: Normocephalic.     Mouth/Throat:     Pharynx: No oropharyngeal exudate.  Eyes:     General: Lids are normal.     Conjunctiva/sclera: Conjunctivae normal.  Cardiovascular:     Rate and Rhythm: Normal rate and regular rhythm.     Heart sounds: Normal heart sounds, S1 normal and S2 normal.  Pulmonary:     Breath sounds: No decreased breath sounds, wheezing, rhonchi or rales.  Abdominal:     Palpations: Abdomen is soft.     Tenderness: There is no abdominal tenderness.  Musculoskeletal:     Right lower leg: No swelling.     Left lower leg: No swelling.  Skin:    General: Skin is warm.     Findings: No rash.  Neurological:     Mental Status: She is alert and oriented to person, place, and time.     Data Reviewed: Personally reviewed CT scan of the cervical spine showing a fracture of the dens.  Family Communication: Updated daughter on the phone  Disposition: Status is: Inpatient Remains inpatient appropriate because: Patient still weak and physical therapy recommending rehab.  Transitional care team has a bed for tomorrow.  Planned Discharge Destination: Rehab  Author: Loletha Grayer, MD 02/13/2021 1:53 PM  For on call review [email or web address].

## 2021-02-13 NOTE — Progress Notes (Signed)
Reviewed CT neck which shows a chronic appearing C2 fracture. Will obtain cervical flexion and extension xrays to evaluate further for dynamic movement. No plans for neurosurgical intervention at this time.  Cooper Render PA-C Neurosurgery

## 2021-02-13 NOTE — Plan of Care (Signed)
Patient back from imaging. Requested to obtain orthostatic vital signs per MD.

## 2021-02-13 NOTE — TOC Progression Note (Addendum)
Transition of Care [facility]) - Progression Note    Patient Details  Name: [name] MRN: [id number] Date of Birth: [birth year]  Transition of Care [facility]) CM/SW [place], LCSW Phone Number: 02/13/2021, 3:06 PM  Clinical Narrative:   anticipated dc tomorrow to [facility]. Auth started. [facility] aware of dc tomorrow and prepared to take pt.    Expected Discharge Plan: [place] Barriers to Discharge: Continued Medical Work up  Expected Discharge Plan and Services Expected Discharge Plan: [place] In-house Referral: Clinical Social Work   Post Acute Care Choice: [place] Living arrangements for the past 2 months: Single Family Home                                       Social Determinants of Health (SDOH) Interventions    Readmission Risk Interventions No flowsheet data found.

## 2021-02-14 ENCOUNTER — Emergency Department
Admission: EM | Admit: 2021-02-14 | Discharge: 2021-02-14 | Disposition: A | Discharge status: Skilled Nursing Facility | Payer: Medicare Other | Attending: Emergency Medicine | Admitting: Emergency Medicine

## 2021-02-14 ENCOUNTER — Encounter: Payer: Self-pay | Admitting: Intensive Care

## 2021-02-14 ENCOUNTER — Other Ambulatory Visit: Payer: Self-pay

## 2021-02-14 DIAGNOSIS — I5032 Chronic diastolic (congestive) heart failure: Secondary | ICD-10-CM | POA: Insufficient documentation

## 2021-02-14 DIAGNOSIS — E039 Hypothyroidism, unspecified: Secondary | ICD-10-CM | POA: Insufficient documentation

## 2021-02-14 DIAGNOSIS — F05 Delirium due to known physiological condition: Secondary | ICD-10-CM

## 2021-02-14 DIAGNOSIS — E119 Type 2 diabetes mellitus without complications: Secondary | ICD-10-CM | POA: Insufficient documentation

## 2021-02-14 DIAGNOSIS — R451 Restlessness and agitation: Secondary | ICD-10-CM | POA: Diagnosis not present

## 2021-02-14 DIAGNOSIS — I2510 Atherosclerotic heart disease of native coronary artery without angina pectoris: Secondary | ICD-10-CM | POA: Insufficient documentation

## 2021-02-14 DIAGNOSIS — Z86010 Personal history of colonic polyps: Secondary | ICD-10-CM | POA: Insufficient documentation

## 2021-02-14 DIAGNOSIS — Z85850 Personal history of malignant neoplasm of thyroid: Secondary | ICD-10-CM | POA: Insufficient documentation

## 2021-02-14 DIAGNOSIS — I110 Hypertensive heart disease with heart failure: Secondary | ICD-10-CM | POA: Insufficient documentation

## 2021-02-14 LAB — URINALYSIS, COMPLETE (UACMP) WITH MICROSCOPIC
Bilirubin Urine: NEGATIVE
Glucose, UA: NEGATIVE mg/dL
Nitrite: NEGATIVE
Protein, ur: 100 mg/dL — AB
Specific Gravity, Urine: 1.02 (ref 1.005–1.030)
pH: 6.5 (ref 5.0–8.0)

## 2021-02-14 LAB — RESP PANEL BY RT-PCR (FLU A&B, COVID) ARPGX2
Influenza A by PCR: NEGATIVE
Influenza B by PCR: NEGATIVE
SARS Coronavirus 2 by RT PCR: NEGATIVE

## 2021-02-14 LAB — GLUCOSE, CAPILLARY: Glucose-Capillary: 139 mg/dL — ABNORMAL HIGH (ref 70–99)

## 2021-02-14 MED ORDER — TRAZODONE HCL 50 MG PO TABS: 25.0000 mg | ORAL_TABLET | Freq: Every evening | ORAL | 0 refills | Status: AC | PRN

## 2021-02-14 NOTE — ED Notes (Signed)
Attempted to call [facility] x 3 but no answer by staff at facility. EMS is transporting pt back to home now and they wil have facility to call if they have questions.

## 2021-02-14 NOTE — Assessment & Plan Note (Signed)
The patient has some sundowning yesterday afternoon.  Did not sleep well last night.  We will add as needed trazodone at night for sleep.  Hopefully getting to the rehab where her husband is will be helpful with her familiar pain symptoms.  We will send off a urine analysis.

## 2021-02-14 NOTE — Discharge Instructions (Signed)
The patient was seen and evaluated the emergency department and is cleared from a medical and psychiatric standpoint.

## 2021-02-14 NOTE — ED Notes (Signed)
Request made for transport to [facility]

## 2021-02-14 NOTE — TOC Progression Note (Signed)
Transition of Care [facility]) - Progression Note    Patient Details  Name: Kaeya Schiffer MRN: [id number] Date of Birth: 1933-07-10  Transition of Care [facility]) CM/SW [place], RN Phone Number: 02/14/2021, 11:07 AM  Clinical Narrative:   EMS called to go to [facility] room 501. Daughter Amy called and notified     Expected Discharge Plan: Skilled Nursing Facility Barriers to Discharge: Continued Medical Work up  Expected Discharge Plan and Services Expected Discharge Plan: [place] In-house Referral: Clinical Social Work   Post Acute Care Choice: [place] Living arrangements for the past 2 months: Single Family Home Expected Discharge Date: 02/14/21                                     Social Determinants of Health (SDOH) Interventions    Readmission Risk Interventions No flowsheet data found.

## 2021-02-14 NOTE — Discharge Summary (Addendum)
Physician Discharge Summary   Patient: [name] MRN: [id number] DOB: 1933-07-09  Admit date:     02/11/2021  Discharge date: 02/14/21  Discharge Physician: Loletha Grayer   PCP: Idelle Crouch, MD   Recommendations at discharge:   Follow-up with team at rehab 1 day.  Discharge Diagnoses: Principal Problem:   Unsteady gait Active Problems:   Dens fracture (HCC)   Multiple skin tears   Sundowning   Stroke ([place])   Hyperlipidemia   Essential hypertension   Diabetes mellitus without complication (HCC)   Idiopathic gout of multiple sites   Generalized weakness   Seizure (HCC)   Hypothyroidism   CAD (coronary artery disease)   Chronic diastolic CHF (congestive heart failure) [facility])  Resolved Problems:   [facility] Course: The patient was admitted to the hospital on 02/11/2021 after a fall.  She has been having multiple falls at home.  CT scan of the head was negative.  MRI of the brain was negative for acute stroke but did show a age indeterminant fracture of the dens.  CT cervical spine showed an old fracture of the dens.  Patient was seen by neurosurgery and no intervention needed.  Since the fracture is likely old she does not need to wear the neck brace.  Patient was seen by physical therapy and they recommended rehab.  The patient did have some sundowning yesterday.  We will add trazodone as needed at night if she has a problem sleeping.  Having a familiar face with her husband at the rehab will likely be helpful.  Assessment and Plan: * Unsteady gait Physical therapy recommending rehab.  Patient will go out to rehab today  Dens fracture [facility])- (present on admission) MRI brain showing age-indeterminate dens fracture but no stroke.  No neurosurgical intervention needed.  Fracture likely old in nature.  Sundowning The patient has some sundowning yesterday afternoon.  Did not sleep well last night.  We will add as needed trazodone at night for sleep.   Hopefully getting to the rehab where her husband is will be helpful with her familiar pain symptoms.  We will send off a urine analysis.  Multiple skin tears- (present on admission) Saturated dressings on the left elbow and left lower extremity with saline before removing.  Place Xeroform gauze Kellie Simmering [phone]) overreached skin tear and cover with dry gauze and secure with Kerlix.  Stroke [facility])- (present on admission) Prior history of completed stroke.  Continue Crestor, Plavix.  Chronic diastolic CHF (congestive heart failure) ([place])- (present on admission) 2D echo on 06/07/2014 showed EF of 50 to 55%.  Currently no signs of heart failure.  CAD (coronary artery disease)- (present on admission) Continue Plavix, metoprolol, as needed nitroglycerin, Crestor, imdur  Hypothyroidism- (present on admission) - Synthroid  Seizure ([place]) Continue Keppra    Generalized weakness Physical therapy recommending rehab.  Idiopathic gout of multiple sites- (present on admission) Continue allopurinol, colchicine  Diabetes mellitus without complication ([place]) Type 2 diabetes without complication.  Recent A1c 6.1, well controlled.  All fingersticks have been good.  Essential hypertension- (present on admission) Continue metoprolol   Hyperlipidemia- (present on admission) Continue Crestor           Consultants: Neurosurgery Procedures performed: None Disposition: Rehabilitation facility Diet recommendation:  Cardiac and Carb modified diet  DISCHARGE MEDICATION: Allergies as of 02/14/2021       Reactions   Ciprofloxacin Diarrhea, Nausea And Vomiting   Codeine Diarrhea, Nausea And Vomiting   Metronidazole Diarrhea, Nausea  And Vomiting, Nausea Only   Other Hives, Other (See Comments)   Distress   Sulfa Antibiotics Other (See Comments), Hives   Distress Distress   Neomycin Itching   Neomycin-bacitracin Zn-polymyx Other (See Comments)   Reaction:  Unknown  Reaction:  Unknown     Bacitracin-polymyxin B Rash   Celecoxib Itching, Rash, Other (See Comments)   Petrolatum-zinc Oxide Hives   Statins Other (See Comments)   Reaction:  Muscle pain Reaction:  Muscle pain Reaction:  Muscle pain   Vioxx [rofecoxib] Itching, Rash        Medication List     STOP taking these medications    doxycycline 100 MG tablet Commonly known as: VIBRA-TABS       TAKE these medications    acetaminophen 500 MG tablet Commonly known as: TYLENOL Take 500-1,000 mg by mouth every 6 (six) hours as needed for mild pain or moderate pain.   allopurinol 100 MG tablet Commonly known as: ZYLOPRIM TAKE 1 TABLET(100 MG) BY MOUTH TWICE DAILY   clopidogrel 75 MG tablet Commonly known as: PLAVIX TAKE 1 TABLET BY MOUTH  DAILY   Colchicine 0.6 MG Caps Commonly known as: Mitigare Take 0.6 mg by mouth daily as needed.   furosemide 20 MG tablet Commonly known as: LASIX Take 20 mg by mouth daily as needed for fluid. (Take with POTASSIUM)   ICAPS AREDS 2 PO Take 1 tablet by mouth 2 (two) times daily.   isosorbide mononitrate 30 MG 24 hr tablet Commonly known as: IMDUR Take 30 mg by mouth daily.   levETIRAcetam 250 MG tablet Commonly known as: KEPPRA Take 1 tablet (250 mg total) by mouth 2 (two) times daily.   levothyroxine 75 MCG tablet Commonly known as: SYNTHROID Take 75 mcg by mouth daily.   metoprolol succinate 50 MG 24 hr tablet Commonly known as: Toprol XL Take 1 tablet (50 mg total) by mouth daily.   nitroGLYCERIN 0.4 MG SL tablet Commonly known as: NITROSTAT PLACE 1 TABLET UNDER THE TONGUE EVERY 5 MINUTES AS NEEDED FOR CHEST PAIN   potassium chloride 10 MEQ tablet Commonly known as: KLOR-CON Take 10 mEq by mouth daily as needed (nutrient replenishment). (Take with FUROSEMIDE)   rosuvastatin 5 MG tablet Commonly known as: CRESTOR TAKE 1 TABLET(5 MG) BY MOUTH DAILY. MAY   traZODone 50 MG tablet Commonly known as: DESYREL Take 0.5 tablets (25 mg total) by  mouth at bedtime as needed for sleep.               Durable Medical Equipment  (From admission, onward)           Start     Ordered   02/12/21 1156  For home use only DME Walker rolling  Once       Question Answer Comment  Walker: With 5 Inch Wheels   Patient needs a walker to treat with the following condition Unsteady gait when walking      02/12/21 1156             Discharge Exam: Filed Weights   02/12/21 0500 02/13/21 0442 02/14/21 0500  Weight: 63.6 kg 60.6 kg 59.9 kg   Physical Exam HENT:     Head: Normocephalic.     Mouth/Throat:     Pharynx: No oropharyngeal exudate.  Eyes:     General: Lids are normal.     Conjunctiva/sclera: Conjunctivae normal.  Cardiovascular:     Rate and Rhythm: Normal rate and regular rhythm.  Heart sounds: Normal heart sounds, S1 normal and S2 normal.  Pulmonary:     Breath sounds: No decreased breath sounds, wheezing, rhonchi or rales.  Abdominal:     Palpations: Abdomen is soft.     Tenderness: There is no abdominal tenderness.  Musculoskeletal:     Right lower leg: No swelling.     Left lower leg: No swelling.  Skin:    General: Skin is warm.     Comments: Skin tear left elbow and left leg.  No signs of infection.  Neurological:     Mental Status: She is alert.     Comments: After reorienting the patient was able to answer questions appropriately and have a good conversation and follow commands.     Condition at discharge: stable  The results of significant diagnostics from this hospitalization (including imaging, microbiology, ancillary and laboratory) are listed below for reference.   Imaging Studies: DG Cervical Spine With Flex & Extend  Result Date: 02/13/2021 CLINICAL DATA:  Fracture C2 EXAM: CERVICAL SPINE COMPLETE WITH FLEXION AND EXTENSION VIEWS COMPARISON:  Previous studies including the CT done on 2021/03/08 FINDINGS: There is transverse radiolucent line in the base of odontoid process. There is  mild 4 mm anterior displacement of odontoid in relation to the body of C2 vertebra seen in the flexion lateral view. In neutral and extension lateral views, alignment of posterior margins of odontoid process and body of C2 vertebra is unremarkable. There is first-degree anterolisthesis at C4-C5 level, possibly from previous ligament injury. Osteopenia is seen in bony structures. Degenerative changes are noted with disc space narrowing and bony spurs at multiple levels in the cervical spine, more so on the right side at C5-C6 and C6-C7 levels. There is mild levoscoliosis. IMPRESSION: Fracture is seen in the base of odontoid process of C2 vertebra. There is mild anterior displacement of odontoid process in the flexion lateral view which is not evident in the extension and neutral positions. Cervical spondylosis with encroachment of neural foramina from C4-C7 levels. Electronically Signed   By: Elmer Picker M.D.   On: 02/13/2021 13:49   CT HEAD WO CONTRAST (5MM)  Result Date: 02/11/2021 CLINICAL DATA:  Recent fall with headaches, initial encounter EXAM: CT HEAD WITHOUT CONTRAST TECHNIQUE: Contiguous axial images were obtained from the base of the skull through the vertex without intravenous contrast. RADIATION DOSE REDUCTION: This exam was performed according to the departmental dose-optimization program which includes automated exposure control, adjustment of the mA and/or kV according to patient size and/or use of iterative reconstruction technique. COMPARISON:  08/25/2020 FINDINGS: Brain: No evidence of acute infarction, hemorrhage, hydrocephalus, extra-axial collection or mass lesion/mass effect. Chronic atrophic and ischemic changes are noted. Vascular: No hyperdense vessel or unexpected calcification. Skull: Normal. Negative for fracture or focal lesion. Sinuses/Orbits: No acute finding. Other: None. IMPRESSION: Atrophic and ischemic changes stable from the prior exam. Electronically Signed   By: Inez Catalina M.D.   On: 02/11/2021 02:28   CT CERVICAL SPINE WO CONTRAST  Result Date: 03/08/2021 CLINICAL DATA:  History of multiple falls with known C1 fracture EXAM: CT CERVICAL SPINE WITHOUT CONTRAST TECHNIQUE: Multidetector CT imaging of the cervical spine was performed without intravenous contrast. Multiplanar CT image reconstructions were also generated. RADIATION DOSE REDUCTION: This exam was performed according to the departmental dose-optimization program which includes automated exposure control, adjustment of the mA and/or kV according to patient size and/or use of iterative reconstruction technique. COMPARISON:  MRI from earlier in the same day, CT  from 06/17/2020 FINDINGS: Alignment: Anterolisthesis of C4 on C5 and C5 on C6 is noted stable in appearance from the prior CT examination. Skull base and vertebrae: 7 cervical segments are well visualized. Vertebral body height is well maintained. There is a fracture through the base of the odontoid identified which appears to involve previously seen cystic changes at the base of the odontoid. The margins appear well corticated and likely chronic in nature. Exuberant pannus is again identified posteriorly. Multilevel facet hypertrophic changes and osteophytic changes are noted. Degenerative anterolisthesis of C4 on C5 and C5 on C6 is seen. Disc space narrowing at C6-7 is noted. Soft tissues and spinal canal: Surrounding soft tissue structures show vascular calcifications. Previously described pannus posterior to the odontoid is noted stable from previous exams. Upper chest: Visualized lung apices are within normal limits. Other: None IMPRESSION: Multilevel degenerative changes similar to that seen on the prior exam. Fracture through the base of the odontoid which appears to involve previously seen cystic degenerative change. The margins appear more well corticated and this likely represents a subacute to chronic fracture. No anterior prevertebral soft tissue  swelling is seen. Electronically Signed   By: Inez Catalina M.D.   On: 02/12/2021 23:02   MR BRAIN W WO CONTRAST  Result Date: 02/12/2021 CLINICAL DATA:  Weakness, multiple falls, history of seizures EXAM: MRI HEAD WITHOUT AND WITH CONTRAST TECHNIQUE: Multiplanar, multiecho pulse sequences of the brain and surrounding structures were obtained without and with intravenous contrast. CONTRAST:  4mL GADAVIST GADOBUTROL 1 MMOL/ML IV SOLN COMPARISON:  CT head dated 1 day prior, MR head 08/25/2020 FINDINGS: Brain: There is no evidence of acute intracranial hemorrhage, extra-axial fluid collection, or acute infarct. There is mild global parenchymal volume loss with prominence of the extra-axial CSF spaces and ventricular system, unchanged since the prior MRI. The ventricular system is stable in size and configuration. There are patchy and confluent foci of FLAIR signal abnormality throughout the subcortical and periventricular white matter likely reflecting sequela of moderate chronic white matter microangiopathy. There is no structural or migration abnormality. The hippocampi are symmetric with no signal abnormality. There is no abnormal enhancement or mass lesion. There is no midline shift. Vascular: Normal flow voids. Skull and upper cervical spine: There is bulky degenerative pannus about the dens with narrowing of the craniocervical junction, unchanged. There is a fracture through the base of the dens which was not present on the prior brain MRI from 07/23/2020, 16-25, 9-13 (the CTs and MRI from 08/25/2020 and 02/11/2021 do not include the dens within the field of view). Sinuses/Orbits: The paranasal sinuses are clear. Bilateral lens implants are in place. The globes and orbits are otherwise unremarkable. Other: None. IMPRESSION: 1. No acute intracranial pathology or epileptogenic focus identified. 2. Fracture of the base of the dens of indeterminate age. Recommend CT of the cervical spine for further evaluation.  Electronically Signed   By: Valetta Mole M.D.   On: 02/12/2021 14:18   DG Hip Unilat W or Wo Pelvis 2-3 Views Left  Result Date: 02/11/2021 CLINICAL DATA:  Fall EXAM: DG HIP (WITH OR WITHOUT PELVIS) 2-3V LEFT COMPARISON:  None. FINDINGS: No fracture or dislocation is seen. Bowel hip joint spaces are preserved. Postsurgical changes involving the lower lumbar spine. Vascular calcifications. IMPRESSION: Negative. Electronically Signed   By: Julian Hy M.D.   On: 02/11/2021 02:00    Microbiology: Results for orders placed or performed during the hospital encounter of 02/11/21  Resp Panel by RT-PCR (Flu A&B,  Covid)     Status: None   Collection Time: 02/11/21  4:29 AM   Specimen: Nasopharyngeal(NP) swabs in vial transport medium  Result Value Ref Range Status   SARS Coronavirus 2 by RT PCR NEGATIVE NEGATIVE Final    Comment: (NOTE) SARS-CoV-2 target nucleic acids are NOT DETECTED.  The SARS-CoV-2 RNA is generally detectable in upper respiratory specimens during the acute phase of infection. The lowest concentration of SARS-CoV-2 viral copies this assay can detect is 138 copies/mL. A negative result does not preclude SARS-Cov-2 infection and should not be used as the sole basis for treatment or other patient management decisions. A negative result may occur with  improper specimen collection/handling, submission of specimen other than nasopharyngeal swab, presence of viral mutation(s) within the areas targeted by this assay, and inadequate number of viral copies(<138 copies/mL). A negative result must be combined with clinical observations, patient history, and epidemiological information. The expected result is Negative.  Fact Sheet for Patients:  EntrepreneurPulse.com.au  Fact Sheet for Healthcare Providers:  IncredibleEmployment.be  This test is no t yet approved or cleared by the [place] FDA and  has been authorized for detection  and/or diagnosis of SARS-CoV-2 by FDA under an Emergency Use Authorization (EUA). This EUA will remain  in effect (meaning this test can be used) for the duration of the COVID-19 declaration under Section 564(b)(1) of the Act, 21 U.S.C.section 360bbb-3(b)(1), unless the authorization is terminated  or revoked sooner.       Influenza A by PCR NEGATIVE NEGATIVE Final   Influenza B by PCR NEGATIVE NEGATIVE Final    Comment: (NOTE) The Xpert Xpress SARS-CoV-2/FLU/RSV plus assay is intended as an aid in the diagnosis of influenza from Nasopharyngeal swab specimens and should not be used as a sole basis for treatment. Nasal washings and aspirates are unacceptable for Xpert Xpress SARS-CoV-2/FLU/RSV testing.  Fact Sheet for Patients: EntrepreneurPulse.com.au  Fact Sheet for Healthcare Providers: IncredibleEmployment.be  This test is not yet approved or cleared by the [place] FDA and has been authorized for detection and/or diagnosis of SARS-CoV-2 by FDA under an Emergency Use Authorization (EUA). This EUA will remain in effect (meaning this test can be used) for the duration of the COVID-19 declaration under Section 564(b)(1) of the Act, 21 U.S.C. section 360bbb-3(b)(1), unless the authorization is terminated or revoked.  Performed at [facility], [address] [place], [place] [address]     Labs: CBC: Recent Labs  Lab 02/11/21 0259  WBC 5.7  NEUTROABS 3.8  HGB 12.2  HCT 38.5  MCV 92.8  PLT 670   Basic Metabolic Panel: Recent Labs  Lab 02/11/21 0259  NA 138  K 4.1  CL 105  CO2 25  GLUCOSE 110*  BUN 21  CREATININE 0.87  CALCIUM 9.4    CBG: Recent Labs  Lab 02/12/21 2136 02/13/21 0815 02/13/21 1155 02/13/21 1629 02/13/21 2104  GLUCAP 109* 104* 101* 124* 123*    Discharge time spent: greater than 30 minutes.  Signed: Loletha Grayer, MD [facility] 02/14/2021    Addendum: with 0-5 WBC  on urinalysis and negative nitrites this is not a urinary tract infection.  Will hold off on any antibiotics.

## 2021-02-14 NOTE — ED Triage Notes (Signed)
Pt in via EMS from [facility]. Pt had a fall last summer, and had multiple falls since. Pt fell 4 days ago, admitted here and then discharged 3 hours ago. Pt husband is also at [facility] for falls and dementia as well. Per family, facility staff reported pt was mean to staff and can no longer stay there until she is evaluated behaviorally and medically to see if a UTI is causing her meanness.

## 2021-02-14 NOTE — Care Management Important Message (Signed)
Important Message  Patient Details  Name: Franchelle Foskett MRN: [id number] Date of Birth: [birth year]   Medicare Important Message Given:  N/A - LOS <3 / Initial given by admissions     Juliann Pulse A Caz Weaver 02/14/2021, 7:55 AM

## 2021-02-14 NOTE — ED Provider Notes (Signed)
[facility] Provider Note    Event Date/Time   First MD Initiated Contact with Patient 02/14/21 1733     (approximate)   History   Agitation   HPI  Michell Giuliano is a 88 y.o. female with past medical history of coronary disease, unsteady gait with frequent falls, CVA, chronic diastolic heart failure, hypothyroidism, disorder who presents from her nursing facility for agitation.  Patient was recently admitted to the hospital for multiple falls and neck dens fracture, she was discharged today.  It was noted that last night she did have an episode of sundowning but was appropriate today.  She was discharged to the same facility that her husband is at.  Apparently she became very angry at the facility and so they sent her to the emergency department to be medically and psychiatrically evaluated prior to returning.  Patient tells me that she was going to get her husband out of the facility to go home and that they were very mean to her and ganged up on her so she got angry.  When I explained to the patient that she is going to be residing at the facility she does recognize that this is the case and is agreeable to going back.  She has no complaints at this time.    Past Medical History:  Diagnosis Date   CAD (coronary artery disease)    a. lexiscan 08/2013: nl wall motion, no ischemia, EF 50-55%; b. cardiac cath 05/31/2014: mLAD 90%, dLAD 50%, dLCx 60%, 1st Mrg 80%, pRCA 60% s/p PCI/DES, mRCA 1st lesion 70%, mRCA 2nd 95% s/p PCI/long DES to cover entire mRCA, RPLB 40%;  c. 05/2014 Staged PCI of LAD w/ 2.5 x 15 mm Xience Alpine DES.   Carotid artery disease (HCC)    Chronic anxiety    Diverticulosis    Esophagitis    GERD (gastroesophageal reflux disease)    History of colon polyps    History of echocardiogram    a. 08/2013: normal LVSF, nl RVSF, no valvular regurgitation or stenosis    Hyperlipidemia    Hypertension    Hypothyroidism    Mild reactive airways  disease    Osteoarthritis    PONV (postoperative nausea and vomiting)    Rheumatoid arthritis ([place])    Stroke ([place])    a. 05/2014 pt c/o garbled speech following cath 5/19. MRI/A 5/26 showed left caudate infarct->conservative mgmt per neuro.   Thyroid cancer ([place])    a. s/p right sided hemi-thyroidectomy; b. planning for radioactive iodine tx; c. followed by Dr. Marcelene Butte   Type 2 diabetes mellitus [facility])     Patient Active Problem List   Diagnosis Date Noted   Sundowning 02/14/2021   Dens fracture ([place]) 02/12/2021   Unsteady gait 02/12/2021   Hypothyroidism 02/11/2021   CAD (coronary artery disease) 02/11/2021   Multiple skin tears 02/11/2021   Chronic diastolic CHF (congestive heart failure) ([place]) 02/11/2021   Seizure ([place]) 08/25/2020   Diverticulitis 07/24/2020   Intractable nausea and vomiting 07/23/2020   Generalized weakness 07/23/2020   Achilles tendon contracture, bilateral 06/29/2017   Acute bilateral low back pain without sciatica 03/17/2016   Idiopathic chronic gout of multiple sites without tophus 03/17/2016   Idiopathic gout of multiple sites 11/27/2015   Cellulitis 05/15/2015   Bilateral leg edema 02/08/2015   Chronic anxiety 11/22/2014   Cerebrovascular accident (CVA) ([place]) 07/25/2014   Arterial vascular disease 07/25/2014   Swelling of right lower extremity 06/22/2014   Diabetes mellitus  without complication ([place])    Stroke ([place]) 06/08/2014   Anemia 06/07/2014   Unstable angina ([place]) 06/06/2014   Carotid artery disease (HCC)    Atherosclerosis of native coronary artery with stable angina pectoris (HCC)    Hyperlipidemia    Essential hypertension    Angina pectoris ([place]) 05/31/2014   SOB (shortness of breath) 04/11/2014   Palpitations 04/11/2014   Other fatigue 04/11/2014   Thyroid cancer ([place]) 04/11/2014     Physical Exam  Triage Vital Signs: ED Triage Vitals  Enc Vitals Group     BP 02/14/21 1734 (!) 128/58     Pulse Rate 02/14/21 1734 79     Resp  02/14/21 1734 18     Temp 02/14/21 1734 98.3 F (36.8 C)     Temp Source 02/14/21 1734 Oral     SpO2 02/14/21 1734 96 %     Weight 02/14/21 1735 132 lb (59.9 kg)     Height 02/14/21 1735 5\' 1"  (1.549 m)     Head Circumference --      Peak Flow --      Pain Score 02/14/21 1735 0     Pain Loc --      Pain Edu? --      Excl. in [place]? --     Most recent vital signs: Vitals:   02/14/21 1734  BP: (!) 128/58  Pulse: 79  Resp: 18  Temp: 98.3 F (36.8 C)  SpO2: 96%     General: Awake, no distress.  CV:  Good peripheral perfusion.  Resp:  Normal effort.  Abd:  No distention.  Neuro:             Awake, Alert, Oriented x 3 patient is calm and cooperative Other:     ED Results / Procedures / Treatments  Labs (all labs ordered are listed, but only abnormal results are displayed) Labs Reviewed - No data to display   EKG     RADIOLOGY    PROCEDURES:    [place] ED: Medications - No data to display   IMPRESSION / MDM / [place] / ED COURSE  I reviewed the triage vital signs and the nursing notes.                              Patient is an 88 year old female who presents from a nursing facility today for agitation.  She was discharged from the hospital today after recently being admitted for frequent falls.  Started on trazodone at night because of some sundowning while in the hospital.  Apparently she became very angry at the facility and so was immediately sent back to the emergency department for medical and psych clearance prior to returning.  Patient is appropriate on my exam she is calm and cooperative and coherent.  Does initially state that she was going to the facility to take her husband back to home but then when I explained to her that she will be now residing the facility because it is unsafe for her to go back home she does recognize this to be true and is agreeable to going back.  UA was obtained this morning which is not showing any  evidence of infection.  Last blood draw was 4 days ago.  Given she currently is without any alteration mental status do not feel that is necessary to do additional work-up at this time.  Suspect that this was situational  given the significant change in the patient's living condition.   FINAL CLINICAL IMPRESSION(S) / ED DIAGNOSES   Final diagnoses:  Agitation     Rx / DC Orders   ED Discharge Orders     None        Note:  This document was prepared using Dragon voice recognition software and may include unintentional dictation errors.   Rada Hay, MD 02/14/21 [phone]

## 2021-02-14 NOTE — TOC Progression Note (Addendum)
Transition of Care [facility]) - Progression Note    Patient Details  Name: Kortlynn Poust MRN: [id number] Date of Birth: [birth year]  [facility] CM/SW Contact  Conception Oms, RN Phone Number: 02/14/2021, 8:43 AM  Clinical Narrative:    PASSR obtained [id number]  Obtained ins approval Auth number [id number]  Anticipate DC to [place] today to room, 501  Expected Discharge Plan: [place] Barriers to Discharge: Continued Medical Work up  Expected Discharge Plan and Services Expected Discharge Plan: [place] In-house Referral: Clinical Social Work   Post Acute Care Choice: [place] Living arrangements for the past 2 months: Single Family Home Expected Discharge Date: 02/14/21                                     Social Determinants of Health (SDOH) Interventions    Readmission Risk Interventions No flowsheet data found.

## 2021-02-14 NOTE — Progress Notes (Signed)
Reviewed cervical flex/ex which shows some mild angulation on flexion however subtle. This fracture is chronic and the patient denies neck pain. The concern for instability is low. No need for further neurosurgical intervention at this time. Please call with any questions or concerns.  Cooper Render PA-C Neurosurgery

## 2021-02-14 NOTE — ED Notes (Signed)
Dr. Starleen Blue has spoken to [facility], they will accept pt back tonight. Pt updated and agrees on plan, attempted to contact daughter, no answer and voicemail box is full. Pt reports that is always the case. [facility] contacted by security Melody at this time, will call report once transport is here

## 2021-02-14 NOTE — Plan of Care (Signed)
Per MD, discharge pending covid results. Will be discharged to [facility]

## 2021-02-14 NOTE — ED Notes (Signed)
Patient placed on pure wick °

## 2021-02-14 NOTE — ED Notes (Signed)
Pend dispo

## 2021-02-14 NOTE — ED Notes (Signed)
Report received from [place], [place]. Patient currently sleeping, respirations regular and unlabored. Q15 minute rounds and observation by Engineer, drilling to continue. Will assess patient once awake.

## 2021-02-14 NOTE — ED Notes (Signed)
MD Mchugh calling facility, this RN spoke with Amy, daughter to update.d

## 2021-03-09 ENCOUNTER — Other Ambulatory Visit: Payer: Self-pay

## 2021-03-09 ENCOUNTER — Encounter: Payer: Self-pay | Admitting: Emergency Medicine

## 2021-03-09 ENCOUNTER — Ambulatory Visit
Admission: EM | Admit: 2021-03-09 | Discharge: 2021-03-09 | Disposition: A | Discharge status: Home / Self Care | Payer: Medicare Other | Attending: Family Medicine | Admitting: Family Medicine

## 2021-03-09 DIAGNOSIS — L03115 Cellulitis of right lower limb: Secondary | ICD-10-CM | POA: Diagnosis not present

## 2021-03-09 MED ORDER — DOXYCYCLINE HYCLATE 100 MG PO CAPS: 100.0000 mg | ORAL_CAPSULE | Freq: Two times a day (BID) | ORAL | 0 refills | Status: DC
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
## 2021-03-09 NOTE — Discharge Instructions (Addendum)
Resume fluid pill tomorrow.  Elevate right extremity with sitting to help with swelling. You have a right leg infection, cellulitis, start doxycycline twice daily for 10 days.  Continue to monitor the swelling and redness involving the right leg if any of your symptoms worsen or you develop fever, nausea or vomiting go immediately to the emergency department.

## 2021-03-13 ENCOUNTER — Encounter: Payer: Self-pay | Admitting: Cardiovascular Disease

## 2021-04-04 ENCOUNTER — Telehealth: Payer: Self-pay | Admitting: Cardiovascular Disease

## 2021-04-04 NOTE — Telephone Encounter (Signed)
Received notes from Dr Doy Hutching to schedule patient an appt with Dr Rockey Situ ASAP for CAD ?No VM ?

## 2021-04-07 NOTE — Telephone Encounter (Signed)
Attempted to schedule . No ans vm full  ?

## 2021-04-14 ENCOUNTER — Telehealth: Payer: Self-pay | Admitting: Cardiovascular Disease

## 2021-04-14 NOTE — Telephone Encounter (Signed)
Attempted to schedule unable to LVM ?

## 2021-04-15 NOTE — Telephone Encounter (Signed)
Several attempts to schedule .  Sent referral back to requesting provider. Closing encounter.  ?

## 2021-04-30 NOTE — Telephone Encounter (Signed)
Attempted to schedule.  LMOV to call office.  ° °

## 2021-05-07 NOTE — Telephone Encounter (Signed)
Unable to contact patient to schedule . Mailed letter.  ?

## 2021-06-23 ENCOUNTER — Other Ambulatory Visit: Payer: Self-pay | Admitting: Cardiovascular Disease
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
## 2021-07-31 ENCOUNTER — Other Ambulatory Visit: Payer: Self-pay | Admitting: Cardiovascular Disease

## 2021-08-07 ENCOUNTER — Other Ambulatory Visit: Payer: Self-pay | Admitting: Cardiovascular Disease

## 2021-08-07 DIAGNOSIS — I2510 Atherosclerotic heart disease of native coronary artery without angina pectoris: Secondary | ICD-10-CM

## 2021-08-08 ENCOUNTER — Other Ambulatory Visit: Payer: Self-pay | Admitting: Cardiovascular Disease

## 2021-08-15 ENCOUNTER — Other Ambulatory Visit: Payer: Self-pay | Admitting: Cardiovascular Disease

## 2021-09-15 ENCOUNTER — Other Ambulatory Visit: Payer: Self-pay

## 2021-09-15 ENCOUNTER — Emergency Department
Admission: EM | Admit: 2021-09-15 | Discharge: 2021-09-15 | Disposition: A | Discharge status: Home / Self Care | Payer: Medicare Other | Attending: Emergency Medicine | Admitting: Emergency Medicine

## 2021-09-15 DIAGNOSIS — T300 Burn of unspecified body region, unspecified degree: Secondary | ICD-10-CM

## 2021-09-15 DIAGNOSIS — L03116 Cellulitis of left lower limb: Secondary | ICD-10-CM | POA: Diagnosis not present

## 2021-09-15 DIAGNOSIS — L03119 Cellulitis of unspecified part of limb: Secondary | ICD-10-CM

## 2021-09-15 LAB — CBC WITH DIFFERENTIAL/PLATELET
Abs Immature Granulocytes: 0.02 10*3/uL (ref 0.00–0.07)
Basophils Absolute: 0 10*3/uL (ref 0.0–0.1)
Basophils Relative: 0 %
Eosinophils Absolute: 0.3 10*3/uL (ref 0.0–0.5)
Eosinophils Relative: 4 %
HCT: 34.9 % — ABNORMAL LOW (ref 36.0–46.0)
Hemoglobin: 11.2 g/dL — ABNORMAL LOW (ref 12.0–15.0)
Immature Granulocytes: 0 %
Lymphocytes Relative: 18 %
Lymphs Abs: 1.3 10*3/uL (ref 0.7–4.0)
MCH: 29.1 pg (ref 26.0–34.0)
MCHC: 32.1 g/dL (ref 30.0–36.0)
MCV: 90.6 fL (ref 80.0–100.0)
Monocytes Absolute: 0.6 10*3/uL (ref 0.1–1.0)
Monocytes Relative: 8 %
Neutro Abs: 5.2 10*3/uL (ref 1.7–7.7)
Neutrophils Relative %: 70 %
Platelets: 208 10*3/uL (ref 150–400)
RBC: 3.85 MIL/uL — ABNORMAL LOW (ref 3.87–5.11)
RDW: 14 % (ref 11.5–15.5)
WBC: 7.4 10*3/uL (ref 4.0–10.5)
nRBC: 0 % (ref 0.0–0.2)

## 2021-09-15 LAB — COMPREHENSIVE METABOLIC PANEL
ALT: 11 U/L (ref 0–44)
AST: 27 U/L (ref 15–41)
Albumin: 3.8 g/dL (ref 3.5–5.0)
Alkaline Phosphatase: 63 U/L (ref 38–126)
Anion gap: 6 (ref 5–15)
BUN: 25 mg/dL — ABNORMAL HIGH (ref 8–23)
CO2: 22 mmol/L (ref 22–32)
Calcium: 9.2 mg/dL (ref 8.9–10.3)
Chloride: 113 mmol/L — ABNORMAL HIGH (ref 98–111)
Creatinine, Ser: 0.88 mg/dL (ref 0.44–1.00)
GFR, Estimated: >60 mL/min (ref >60)
Glucose, Bld: 141 mg/dL — ABNORMAL HIGH (ref 70–99)
Potassium: 4.1 mmol/L (ref 3.5–5.1)
Sodium: 141 mmol/L (ref 135–145)
Total Bilirubin: 0.7 mg/dL (ref 0.3–1.2)
Total Protein: 6.9 g/dL (ref 6.5–8.1)

## 2021-09-15 MED ORDER — CEPHALEXIN 500 MG PO CAPS: 500.0000 mg | ORAL_CAPSULE | Freq: Two times a day (BID) | ORAL | 0 refills | Status: AC

## 2021-09-15 MED ORDER — CEPHALEXIN 500 MG PO CAPS
500.0000 mg | ORAL_CAPSULE | Freq: Once | ORAL | Status: AC
  Administered 2021-09-15: 500 mg via ORAL
  Filled 2021-09-15: qty 1
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
## 2021-09-20 ENCOUNTER — Other Ambulatory Visit: Payer: Self-pay | Admitting: Cardiovascular Disease

## 2021-09-20 DIAGNOSIS — I2510 Atherosclerotic heart disease of native coronary artery without angina pectoris: Secondary | ICD-10-CM

## 2021-09-27 ENCOUNTER — Emergency Department
Admission: EM | Admit: 2021-09-27 | Discharge: 2021-09-27 | Disposition: A | Discharge status: Home / Self Care | Payer: Medicare Other | Attending: Emergency Medicine | Admitting: Emergency Medicine

## 2021-09-27 ENCOUNTER — Other Ambulatory Visit: Payer: Self-pay

## 2021-09-27 DIAGNOSIS — L03115 Cellulitis of right lower limb: Secondary | ICD-10-CM | POA: Insufficient documentation

## 2021-09-27 MED ORDER — DOXYCYCLINE HYCLATE 100 MG PO CAPS: 100.0000 mg | ORAL_CAPSULE | Freq: Two times a day (BID) | ORAL | 0 refills | Status: DC

## 2021-09-27 MED ORDER — DOXYCYCLINE HYCLATE 100 MG PO TABS
100.0000 mg | ORAL_TABLET | ORAL | Status: AC
  Administered 2021-09-27: 100 mg via ORAL
  Filled 2021-09-27: qty 1

## 2021-09-27 MED ORDER — DOXYCYCLINE HYCLATE 100 MG PO CAPS: 100.0000 mg | ORAL_CAPSULE | Freq: Two times a day (BID) | ORAL | 0 refills | Status: AC
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
## 2021-09-27 NOTE — ED Notes (Signed)
ED Provider at bedside. 
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
## 2021-09-30 ENCOUNTER — Ambulatory Visit: Payer: Medicare Other | Attending: Cardiovascular Disease | Admitting: Cardiovascular Disease

## 2021-09-30 ENCOUNTER — Encounter: Payer: Self-pay | Admitting: Cardiovascular Disease

## 2021-09-30 VITALS — BP 110/50 | HR 86 | Ht 61.0 in | Wt 130.4 lb

## 2021-09-30 DIAGNOSIS — I6523 Occlusion and stenosis of bilateral carotid arteries: Secondary | ICD-10-CM | POA: Diagnosis not present

## 2021-09-30 DIAGNOSIS — I10 Essential (primary) hypertension: Secondary | ICD-10-CM

## 2021-09-30 DIAGNOSIS — I739 Peripheral vascular disease, unspecified: Secondary | ICD-10-CM | POA: Diagnosis not present

## 2021-09-30 DIAGNOSIS — E1159 Type 2 diabetes mellitus with other circulatory complications: Secondary | ICD-10-CM

## 2021-09-30 DIAGNOSIS — I25118 Atherosclerotic heart disease of native coronary artery with other forms of angina pectoris: Secondary | ICD-10-CM

## 2021-09-30 DIAGNOSIS — I639 Cerebral infarction, unspecified: Secondary | ICD-10-CM

## 2021-09-30 DIAGNOSIS — E782 Mixed hyperlipidemia: Secondary | ICD-10-CM

## 2021-09-30 DIAGNOSIS — I2510 Atherosclerotic heart disease of native coronary artery without angina pectoris: Secondary | ICD-10-CM

## 2021-09-30 MED ORDER — ROSUVASTATIN CALCIUM 5 MG PO TABS: ORAL_TABLET | ORAL | 3 refills | Status: DC

## 2021-09-30 MED ORDER — ISOSORBIDE MONONITRATE ER 30 MG PO TB24: 30.0000 mg | ORAL_TABLET | Freq: Every day | ORAL | 3 refills | Status: DC

## 2021-09-30 MED ORDER — NITROGLYCERIN 0.4 MG SL SUBL: 0.4000 mg | SUBLINGUAL_TABLET | SUBLINGUAL | 3 refills | Status: DC | PRN

## 2021-09-30 MED ORDER — FUROSEMIDE 20 MG PO TABS: 20.0000 mg | ORAL_TABLET | Freq: Every day | ORAL | 0 refills | Status: DC | PRN

## 2021-09-30 MED ORDER — METOPROLOL SUCCINATE ER 25 MG PO TB24: 12.5000 mg | ORAL_TABLET | Freq: Every day | ORAL | 3 refills | Status: DC

## 2021-09-30 MED ORDER — CLOPIDOGREL BISULFATE 75 MG PO TABS: 75.0000 mg | ORAL_TABLET | Freq: Every day | ORAL | 3 refills | Status: DC

## 2021-09-30 MED ORDER — POTASSIUM CHLORIDE ER 10 MEQ PO TBCR: 10.0000 meq | EXTENDED_RELEASE_TABLET | Freq: Every day | ORAL | 0 refills | Status: DC | PRN

## 2021-09-30 NOTE — Patient Instructions (Addendum)
Please call if you have more chest pain Stress test could be ordered   Medication Instructions:  - Your physician has recommended you make the following change in your medication:   1) RESTART metoprolol succinate 25 mg: - take 0.5 tablet (12.5 mg) once daily  If you need a refill on your cardiac medications before your next appointment, please call your pharmacy.    Lab work: No new labs needed   Testing/Procedures: No new testing needed   Follow-Up: At [facility], you and your health needs are our priority.  As part of our continuing mission to provide you with exceptional heart care, we have created designated Provider Care Teams.  These Care Teams include your primary Cardiologist (physician) and Advanced Practice Providers (APPs -  Physician Assistants and Nurse Practitioners) who all work together to provide you with the care you need, when you need it.  You will need a follow up appointment in 6 months  Providers on your designated Care Team:   Murray Hodgkins, NP Christell Faith, PA-C Cadence Kathlen Mody, [place]  COVID-19 Vaccine Information can be found at: [email or web address].uk For questions related to vaccine distribution or appointments, please email [email or web address] or call 3524858715.

## 2021-10-11 ENCOUNTER — Encounter: Payer: Self-pay | Admitting: Intensive Care

## 2021-10-11 ENCOUNTER — Emergency Department
Admission: EM | Admit: 2021-10-11 | Discharge: 2021-10-11 | Disposition: A | Discharge status: Home / Self Care | Payer: Medicare Other | Source: Home / Self Care | Attending: Emergency Medicine | Admitting: Emergency Medicine

## 2021-10-11 ENCOUNTER — Other Ambulatory Visit: Payer: Self-pay

## 2021-10-11 DIAGNOSIS — U071 COVID-19: Secondary | ICD-10-CM | POA: Insufficient documentation

## 2021-10-11 DIAGNOSIS — I110 Hypertensive heart disease with heart failure: Secondary | ICD-10-CM | POA: Insufficient documentation

## 2021-10-11 DIAGNOSIS — I5030 Unspecified diastolic (congestive) heart failure: Secondary | ICD-10-CM | POA: Insufficient documentation

## 2021-10-11 DIAGNOSIS — E119 Type 2 diabetes mellitus without complications: Secondary | ICD-10-CM | POA: Insufficient documentation

## 2021-10-11 DIAGNOSIS — I2510 Atherosclerotic heart disease of native coronary artery without angina pectoris: Secondary | ICD-10-CM | POA: Insufficient documentation

## 2021-10-11 DIAGNOSIS — Z85850 Personal history of malignant neoplasm of thyroid: Secondary | ICD-10-CM | POA: Insufficient documentation

## 2021-10-11 DIAGNOSIS — J111 Influenza due to unidentified influenza virus with other respiratory manifestations: Secondary | ICD-10-CM

## 2021-10-11 LAB — COMPREHENSIVE METABOLIC PANEL
ALT: 15 U/L (ref 0–44)
AST: 31 U/L (ref 15–41)
Albumin: 4 g/dL (ref 3.5–5.0)
Alkaline Phosphatase: 71 U/L (ref 38–126)
Anion gap: 9 (ref 5–15)
BUN: 14 mg/dL (ref 8–23)
CO2: 22 mmol/L (ref 22–32)
Calcium: 9.1 mg/dL (ref 8.9–10.3)
Chloride: 109 mmol/L (ref 98–111)
Creatinine, Ser: 0.68 mg/dL (ref 0.44–1.00)
GFR, Estimated: >60 mL/min (ref >60)
Glucose, Bld: 143 mg/dL — ABNORMAL HIGH (ref 70–99)
Potassium: 4.5 mmol/L (ref 3.5–5.1)
Sodium: 140 mmol/L (ref 135–145)
Total Bilirubin: 0.7 mg/dL (ref 0.3–1.2)
Total Protein: 7 g/dL (ref 6.5–8.1)

## 2021-10-11 LAB — URINALYSIS, ROUTINE W REFLEX MICROSCOPIC
Bilirubin Urine: NEGATIVE
Glucose, UA: NEGATIVE mg/dL
Hgb urine dipstick: NEGATIVE
Ketones, ur: 20 mg/dL — AB
Leukocytes,Ua: NEGATIVE
Nitrite: NEGATIVE
Protein, ur: 100 mg/dL — AB
Specific Gravity, Urine: 1.013 (ref 1.005–1.030)
pH: 5 (ref 5.0–8.0)

## 2021-10-11 LAB — CBC
HCT: 35.3 % — ABNORMAL LOW (ref 36.0–46.0)
Hemoglobin: 11.3 g/dL — ABNORMAL LOW (ref 12.0–15.0)
MCH: 28.7 pg (ref 26.0–34.0)
MCHC: 32 g/dL (ref 30.0–36.0)
MCV: 89.6 fL (ref 80.0–100.0)
Platelets: 203 10*3/uL (ref 150–400)
RBC: 3.94 MIL/uL (ref 3.87–5.11)
RDW: 13.8 % (ref 11.5–15.5)
WBC: 4 10*3/uL (ref 4.0–10.5)
nRBC: 0 % (ref 0.0–0.2)

## 2021-10-11 LAB — RESP PANEL BY RT-PCR (FLU A&B, COVID) ARPGX2
Influenza A by PCR: NEGATIVE
Influenza B by PCR: NEGATIVE
SARS Coronavirus 2 by RT PCR: POSITIVE — AB

## 2021-10-11 LAB — CK: Total CK: 52 U/L (ref 38–234)

## 2021-10-11 LAB — LIPASE, BLOOD: Lipase: 45 U/L (ref 11–51)

## 2021-10-11 MED ORDER — MORPHINE SULFATE (PF) 2 MG/ML IV SOLN
2.0000 mg | Freq: Once | INTRAVENOUS | Status: AC
  Administered 2021-10-11: 2 mg via INTRAVENOUS
  Filled 2021-10-11: qty 1

## 2021-10-11 MED ORDER — ONDANSETRON 4 MG PO TBDP: 4.0000 mg | ORAL_TABLET | Freq: Three times a day (TID) | ORAL | 0 refills | Status: DC | PRN

## 2021-10-11 MED ORDER — DEXTROSE IN LACTATED RINGERS 5 % IV SOLN
1000.0000 mL | Freq: Once | INTRAVENOUS | Status: AC
  Administered 2021-10-11: 1000 mL via INTRAVENOUS

## 2021-10-11 MED ORDER — MOLNUPIRAVIR 200 MG PO CAPS: 4.0000 | ORAL_CAPSULE | Freq: Two times a day (BID) | ORAL | 0 refills | Status: AC

## 2021-10-11 MED ORDER — ONDANSETRON HCL 4 MG/2ML IJ SOLN
4.0000 mg | Freq: Once | INTRAMUSCULAR | Status: AC
  Administered 2021-10-11: 4 mg via INTRAVENOUS
  Filled 2021-10-11: qty 2

## 2021-10-11 NOTE — ED Notes (Signed)
Pt given cup of water per PO/fluid challenge.

## 2021-10-11 NOTE — ED Notes (Signed)
Pt reports she is feeling much better

## 2021-10-11 NOTE — ED Triage Notes (Signed)
First Nurse Note: Pt via EMS from home. Pt c/o weakness, headache, body aches, chills, dizziness nausea. Denies diarrhea, cough, or SOB. Denies any urinary symptoms.   96 HR  179/79 98% on RA 98.7

## 2021-10-11 NOTE — ED Provider Notes (Signed)
[facility] Provider Note    Event Date/Time   First MD Initiated Contact with Patient 10/11/21 1628     (approximate)   History   Chief Complaint: Emesis and Generalized Body Aches   HPI  [name] is a 88 y.o. female with a history of thyroid cancer, CAD, hyperlipidemia, hypertension, diabetes, anxiety, diastolic CHF, sundowning who comes ED complaining of gradual onset of chills, generalized body aches, nausea vomiting, generalized headache.  No vision changes or focal paresthesias or motor weakness.  No shortness of breath or chest pain.  She has occasional cough.  No dysuria.  No abdominal pain.  No diarrhea or constipation.  No known sick contacts.     Physical Exam   Triage Vital Signs: ED Triage Vitals  Enc Vitals Group     BP 10/11/21 1554 (!) 140/66     Pulse Rate 10/11/21 1554 100     Resp 10/11/21 1554 16     Temp 10/11/21 1554 98.6 F (37 C)     Temp Source 10/11/21 1554 Oral     SpO2 10/11/21 1554 94 %     Weight 10/11/21 1551 128 lb (58.1 kg)     Height 10/11/21 1551 '5\' 1"'$  (1.549 m)     Head Circumference --      Peak Flow --      Pain Score 10/11/21 1550 8     Pain Loc --      Pain Edu? --      Excl. in [place]? --     Most recent vital signs: Vitals:   10/11/21 1700 10/11/21 1815  BP: (!) 189/59 (!) 169/88  Pulse: 79 (!) 103  Resp: (!) 22 (!) 27  Temp:    SpO2: 100% 100%    General: Awake, no distress.  CV:  Good peripheral perfusion.  Regular rate and rhythm, heart rate of 80.  Normal peripheral pulses. Resp:  Normal effort.  Clear to auscultation bilaterally Abd:  No distention.  Soft and nontender Other:  No lower extremity edema, no rash.  Dry mucous membranes.   ED Results / Procedures / Treatments   Labs (all labs ordered are listed, but only abnormal results are displayed) Labs Reviewed  RESP PANEL BY RT-PCR (FLU A&B, COVID) ARPGX2 - Abnormal; Notable for the following components:      Result  Value   SARS Coronavirus 2 by RT PCR POSITIVE (*)    All other components within normal limits  COMPREHENSIVE METABOLIC PANEL - Abnormal; Notable for the following components:   Glucose, Bld 143 (*)    All other components within normal limits  CBC - Abnormal; Notable for the following components:   Hemoglobin 11.3 (*)    HCT 35.3 (*)    All other components within normal limits  URINALYSIS, ROUTINE W REFLEX MICROSCOPIC - Abnormal; Notable for the following components:   Color, Urine YELLOW (*)    APPearance CLOUDY (*)    Ketones, ur 20 (*)    Protein, ur 100 (*)    Bacteria, UA RARE (*)    All other components within normal limits  LIPASE, BLOOD  CK     EKG Interpreted by me Sinus rhythm, rate of 82.  Normal axis intervals QRS ST segments and T waves.  No ischemic changes.   RADIOLOGY    PROCEDURES:  .1-3 Lead EKG Interpretation  Performed by: Carrie Mew, MD Authorized by: Carrie Mew, MD     Interpretation: normal  ECG rate:  80   ECG rate assessment: normal     Rhythm: sinus rhythm     Ectopy: none     Conduction: normal      MEDICATIONS ORDERED IN ED: Medications  dextrose 5 % in lactated ringers infusion (1,000 mLs Intravenous New Bag/Given 10/11/21 1707)  ondansetron (ZOFRAN) injection 4 mg (4 mg Intravenous Given 10/11/21 1658)  morphine (PF) 2 MG/ML injection 2 mg (2 mg Intravenous Given 10/11/21 1701)     IMPRESSION / MDM / ASSESSMENT AND PLAN / ED COURSE  I reviewed the triage vital signs and the nursing notes.                              Differential diagnosis includes, but is not limited to, viral illness, dehydration, AKI, electrolyte abnormality, UTI, pneumonia, pancreatitis, myositis.  Doubt skin or soft tissue infection, septic arthritis, stroke, intracranial hemorrhage, ACS PE dissection AAA, or sepsis  Patient's presentation is most consistent with acute presentation with potential threat to life or bodily  function.  Patient presents with severe generalized pain along with constitutional symptoms suggestive of a flulike illness.  We will give IV fluids, IV morphine and Zofran for symptom relief and hydration.  Follow-up COVID test and chest x-ray.   Clinical Course as of 10/11/21 1831  Sat Oct 11, 2021  1742 SARS Coronavirus 2 by RT PCR(!): POSITIVE Covid +. Will start antivirals if sx are improved [PS]    Clinical Course User Index [PS] Carrie Mew, MD     ----------------------------------------- 6:31 PM on 10/11/2021 ----------------------------------------- Patient feeling much better.  Vital signs remain normal.  No more vomiting.  If patient is okay with p.o. challenge, she can be discharged home.  Prescriptions for Zofran and molnupiravir sent to her pharmacy.  Return precautions discussed.  Patient's spouse at bedside counseled on symptom monitoring for himself, home testing, obtaining antivirals directly from pharmacy if indicated, but also encouraged to seek medical attention if he has worrisome symptoms.  FINAL CLINICAL IMPRESSION(S) / ED DIAGNOSES   Final diagnoses:  COVID-19 virus infection  Influenza-like illness     Rx / DC Orders   ED Discharge Orders          Ordered    ondansetron (ZOFRAN-ODT) 4 MG disintegrating tablet  Every 8 hours PRN        10/11/21 1830    molnupiravir EUA (LAGEVRIO) 200 MG CAPS capsule  2 times daily        10/11/21 1830             Note:  This document was prepared using Dragon voice recognition software and may include unintentional dictation errors.   Carrie Mew, MD 10/11/21 [phone]

## 2021-10-11 NOTE — ED Triage Notes (Signed)
Arrived by EMS from home. Patient reports symptoms started through the night of chills, body aches, N/V, and headache. Denies SOB or CP.

## 2021-10-13 ENCOUNTER — Other Ambulatory Visit: Payer: Self-pay

## 2021-10-13 ENCOUNTER — Inpatient Hospital Stay
Admission: EM | Admit: 2021-10-13 | Discharge: 2021-10-15 | DRG: 178 | Disposition: A | Discharge status: Home / Self Care | Payer: Medicare Other | Attending: Internal Medicine | Admitting: Internal Medicine

## 2021-10-13 ENCOUNTER — Encounter: Payer: Self-pay | Admitting: Emergency Medicine

## 2021-10-13 ENCOUNTER — Emergency Department: Payer: Medicare Other

## 2021-10-13 DIAGNOSIS — E890 Postprocedural hypothyroidism: Secondary | ICD-10-CM | POA: Diagnosis present

## 2021-10-13 DIAGNOSIS — Z789 Other specified health status: Secondary | ICD-10-CM

## 2021-10-13 DIAGNOSIS — Z85850 Personal history of malignant neoplasm of thyroid: Secondary | ICD-10-CM

## 2021-10-13 DIAGNOSIS — Z79890 Hormone replacement therapy (postmenopausal): Secondary | ICD-10-CM

## 2021-10-13 DIAGNOSIS — E039 Hypothyroidism, unspecified: Secondary | ICD-10-CM | POA: Diagnosis present

## 2021-10-13 DIAGNOSIS — Z7984 Long term (current) use of oral hypoglycemic drugs: Secondary | ICD-10-CM

## 2021-10-13 DIAGNOSIS — M1A09X0 Idiopathic chronic gout, multiple sites, without tophus (tophi): Secondary | ICD-10-CM | POA: Diagnosis present

## 2021-10-13 DIAGNOSIS — M069 Rheumatoid arthritis, unspecified: Secondary | ICD-10-CM | POA: Diagnosis present

## 2021-10-13 DIAGNOSIS — C73 Malignant neoplasm of thyroid gland: Secondary | ICD-10-CM | POA: Diagnosis present

## 2021-10-13 DIAGNOSIS — I2510 Atherosclerotic heart disease of native coronary artery without angina pectoris: Secondary | ICD-10-CM | POA: Diagnosis present

## 2021-10-13 DIAGNOSIS — F419 Anxiety disorder, unspecified: Secondary | ICD-10-CM | POA: Diagnosis present

## 2021-10-13 DIAGNOSIS — R531 Weakness: Secondary | ICD-10-CM

## 2021-10-13 DIAGNOSIS — E119 Type 2 diabetes mellitus without complications: Secondary | ICD-10-CM | POA: Diagnosis present

## 2021-10-13 DIAGNOSIS — Z885 Allergy status to narcotic agent status: Secondary | ICD-10-CM

## 2021-10-13 DIAGNOSIS — Z9882 Breast implant status: Secondary | ICD-10-CM

## 2021-10-13 DIAGNOSIS — U071 COVID-19: Principal | ICD-10-CM | POA: Diagnosis present

## 2021-10-13 DIAGNOSIS — I10 Essential (primary) hypertension: Secondary | ICD-10-CM | POA: Diagnosis present

## 2021-10-13 DIAGNOSIS — X100XXA Contact with hot drinks, initial encounter: Secondary | ICD-10-CM | POA: Diagnosis present

## 2021-10-13 DIAGNOSIS — Z79899 Other long term (current) drug therapy: Secondary | ICD-10-CM

## 2021-10-13 DIAGNOSIS — R112 Nausea with vomiting, unspecified: Secondary | ICD-10-CM | POA: Diagnosis present

## 2021-10-13 DIAGNOSIS — G40909 Epilepsy, unspecified, not intractable, without status epilepticus: Secondary | ICD-10-CM | POA: Diagnosis present

## 2021-10-13 DIAGNOSIS — M1990 Unspecified osteoarthritis, unspecified site: Secondary | ICD-10-CM | POA: Diagnosis present

## 2021-10-13 DIAGNOSIS — E876 Hypokalemia: Secondary | ICD-10-CM | POA: Diagnosis present

## 2021-10-13 DIAGNOSIS — Z8249 Family history of ischemic heart disease and other diseases of the circulatory system: Secondary | ICD-10-CM

## 2021-10-13 DIAGNOSIS — Z955 Presence of coronary angioplasty implant and graft: Secondary | ICD-10-CM

## 2021-10-13 DIAGNOSIS — T24011A Burn of unspecified degree of right thigh, initial encounter: Secondary | ICD-10-CM | POA: Diagnosis present

## 2021-10-13 DIAGNOSIS — Z881 Allergy status to other antibiotic agents status: Secondary | ICD-10-CM

## 2021-10-13 DIAGNOSIS — Z7902 Long term (current) use of antithrombotics/antiplatelets: Secondary | ICD-10-CM

## 2021-10-13 DIAGNOSIS — Z8673 Personal history of transient ischemic attack (TIA), and cerebral infarction without residual deficits: Secondary | ICD-10-CM

## 2021-10-13 DIAGNOSIS — Z86010 Personal history of colonic polyps: Secondary | ICD-10-CM

## 2021-10-13 DIAGNOSIS — I5032 Chronic diastolic (congestive) heart failure: Secondary | ICD-10-CM | POA: Diagnosis present

## 2021-10-13 DIAGNOSIS — Z882 Allergy status to sulfonamides status: Secondary | ICD-10-CM

## 2021-10-13 DIAGNOSIS — I110 Hypertensive heart disease with heart failure: Secondary | ICD-10-CM | POA: Diagnosis present

## 2021-10-13 DIAGNOSIS — Z888 Allergy status to other drugs, medicaments and biological substances status: Secondary | ICD-10-CM

## 2021-10-13 DIAGNOSIS — E785 Hyperlipidemia, unspecified: Secondary | ICD-10-CM | POA: Diagnosis present

## 2021-10-13 DIAGNOSIS — R569 Unspecified convulsions: Secondary | ICD-10-CM

## 2021-10-13 LAB — TROPONIN I (HIGH SENSITIVITY): Troponin I (High Sensitivity): 26 ng/L — ABNORMAL HIGH (ref <18)

## 2021-10-13 LAB — HEPATIC FUNCTION PANEL
ALT: 23 U/L (ref 0–44)
AST: 47 U/L — ABNORMAL HIGH (ref 15–41)
Albumin: 3.9 g/dL (ref 3.5–5.0)
Alkaline Phosphatase: 61 U/L (ref 38–126)
Bilirubin, Direct: <0.1 mg/dL (ref 0.0–0.2)
Total Bilirubin: 0.6 mg/dL (ref 0.3–1.2)
Total Protein: 6.9 g/dL (ref 6.5–8.1)

## 2021-10-13 LAB — URINALYSIS, ROUTINE W REFLEX MICROSCOPIC
Bilirubin Urine: NEGATIVE
Glucose, UA: NEGATIVE mg/dL
Hgb urine dipstick: NEGATIVE
Ketones, ur: NEGATIVE mg/dL
Leukocytes,Ua: NEGATIVE
Nitrite: NEGATIVE
Protein, ur: NEGATIVE mg/dL
Specific Gravity, Urine: 1.008 (ref 1.005–1.030)
pH: 5 (ref 5.0–8.0)

## 2021-10-13 LAB — BASIC METABOLIC PANEL
Anion gap: 11 (ref 5–15)
BUN: 24 mg/dL — ABNORMAL HIGH (ref 8–23)
CO2: 23 mmol/L (ref 22–32)
Calcium: 9.1 mg/dL (ref 8.9–10.3)
Chloride: 108 mmol/L (ref 98–111)
Creatinine, Ser: 0.98 mg/dL (ref 0.44–1.00)
GFR, Estimated: 56 mL/min — ABNORMAL LOW (ref >60)
Glucose, Bld: 114 mg/dL — ABNORMAL HIGH (ref 70–99)
Potassium: 3.8 mmol/L (ref 3.5–5.1)
Sodium: 142 mmol/L (ref 135–145)

## 2021-10-13 LAB — CBC
HCT: 42.6 % (ref 36.0–46.0)
Hemoglobin: 13.2 g/dL (ref 12.0–15.0)
MCH: 28.3 pg (ref 26.0–34.0)
MCHC: 31 g/dL (ref 30.0–36.0)
MCV: 91.2 fL (ref 80.0–100.0)
Platelets: 155 10*3/uL (ref 150–400)
RBC: 4.67 MIL/uL (ref 3.87–5.11)
RDW: 14.1 % (ref 11.5–15.5)
WBC: 4.7 10*3/uL (ref 4.0–10.5)
nRBC: 0 % (ref 0.0–0.2)

## 2021-10-13 LAB — MAGNESIUM: Magnesium: 1.8 mg/dL (ref 1.7–2.4)

## 2021-10-13 LAB — GLUCOSE, CAPILLARY: Glucose-Capillary: 142 mg/dL — ABNORMAL HIGH (ref 70–99)

## 2021-10-13 LAB — LACTIC ACID, PLASMA: Lactic Acid, Venous: 1.2 mmol/L (ref 0.5–1.9)

## 2021-10-13 MED ORDER — ALBUTEROL SULFATE HFA 108 (90 BASE) MCG/ACT IN AERS: 2.0000 | INHALATION_SPRAY | RESPIRATORY_TRACT | Status: DC | PRN

## 2021-10-13 MED ORDER — SODIUM CHLORIDE 0.9 % IV SOLN: INTRAVENOUS | Status: DC

## 2021-10-13 MED ORDER — VITAMIN C 500 MG PO TABS
500.0000 mg | ORAL_TABLET | Freq: Every day | ORAL | Status: DC
  Administered 2021-10-13 – 2021-10-15 (×3): 500 mg via ORAL
  Filled 2021-10-13 (×3): qty 1

## 2021-10-13 MED ORDER — ISOSORBIDE MONONITRATE ER 30 MG PO TB24
30.0000 mg | ORAL_TABLET | Freq: Every day | ORAL | Status: DC
  Administered 2021-10-14 – 2021-10-15 (×2): 30 mg via ORAL
  Filled 2021-10-13 (×2): qty 1

## 2021-10-13 MED ORDER — LORAZEPAM 2 MG/ML IJ SOLN: 2.0000 mg | INTRAMUSCULAR | Status: DC | PRN

## 2021-10-13 MED ORDER — ONDANSETRON HCL 4 MG PO TABS
4.0000 mg | ORAL_TABLET | Freq: Four times a day (QID) | ORAL | Status: DC | PRN
  Administered 2021-10-13 – 2021-10-14 (×2): 4 mg via ORAL
  Filled 2021-10-13 (×2): qty 1

## 2021-10-13 MED ORDER — SODIUM CHLORIDE 0.9 % IV BOLUS
1000.0000 mL | Freq: Once | INTRAVENOUS | Status: AC
  Administered 2021-10-13: 1000 mL via INTRAVENOUS

## 2021-10-13 MED ORDER — TRAZODONE HCL 50 MG PO TABS: 25.0000 mg | ORAL_TABLET | Freq: Every evening | ORAL | Status: DC | PRN

## 2021-10-13 MED ORDER — LEVOTHYROXINE SODIUM 100 MCG PO TABS
100.0000 ug | ORAL_TABLET | Freq: Every day | ORAL | Status: DC
  Administered 2021-10-14 – 2021-10-15 (×2): 100 ug via ORAL
  Filled 2021-10-13 (×2): qty 1

## 2021-10-13 MED ORDER — NITROGLYCERIN 0.4 MG SL SUBL: 0.4000 mg | SUBLINGUAL_TABLET | SUBLINGUAL | Status: DC | PRN

## 2021-10-13 MED ORDER — CLOPIDOGREL BISULFATE 75 MG PO TABS
75.0000 mg | ORAL_TABLET | Freq: Every day | ORAL | Status: DC
  Administered 2021-10-14 – 2021-10-15 (×2): 75 mg via ORAL
  Filled 2021-10-13 (×2): qty 1

## 2021-10-13 MED ORDER — GUAIFENESIN-DM 100-10 MG/5ML PO SYRP: 10.0000 mL | ORAL_SOLUTION | ORAL | Status: DC | PRN

## 2021-10-13 MED ORDER — HYDROCOD POLI-CHLORPHE POLI ER 10-8 MG/5ML PO SUER: 5.0000 mL | Freq: Two times a day (BID) | ORAL | Status: DC | PRN

## 2021-10-13 MED ORDER — MOLNUPIRAVIR EUA 200MG CAPSULE
4.0000 | ORAL_CAPSULE | Freq: Two times a day (BID) | ORAL | Status: DC
  Administered 2021-10-13 – 2021-10-15 (×4): 800 mg via ORAL
  Filled 2021-10-13: qty 4

## 2021-10-13 MED ORDER — METOPROLOL SUCCINATE ER 25 MG PO TB24
12.5000 mg | ORAL_TABLET | Freq: Every day | ORAL | Status: DC
  Administered 2021-10-13 – 2021-10-15 (×3): 12.5 mg via ORAL
  Filled 2021-10-13 (×2): qty 1
  Filled 2021-10-13 (×2): qty 0.5

## 2021-10-13 MED ORDER — HYDRALAZINE HCL 10 MG PO TABS: 10.0000 mg | ORAL_TABLET | Freq: Four times a day (QID) | ORAL | Status: DC | PRN

## 2021-10-13 MED ORDER — ENOXAPARIN SODIUM 40 MG/0.4ML IJ SOSY
40.0000 mg | PREFILLED_SYRINGE | INTRAMUSCULAR | Status: DC
  Administered 2021-10-13 – 2021-10-14 (×2): 40 mg via SUBCUTANEOUS
  Filled 2021-10-13 (×2): qty 0.4

## 2021-10-13 MED ORDER — LEVETIRACETAM 250 MG PO TABS
250.0000 mg | ORAL_TABLET | Freq: Two times a day (BID) | ORAL | Status: DC
  Administered 2021-10-13 – 2021-10-15 (×4): 250 mg via ORAL
  Filled 2021-10-13 (×5): qty 1

## 2021-10-13 MED ORDER — SENNOSIDES-DOCUSATE SODIUM 8.6-50 MG PO TABS: 1.0000 | ORAL_TABLET | Freq: Every evening | ORAL | Status: DC | PRN

## 2021-10-13 MED ORDER — ACETAMINOPHEN 325 MG PO TABS: 650.0000 mg | ORAL_TABLET | Freq: Four times a day (QID) | ORAL | Status: DC | PRN

## 2021-10-13 MED ORDER — SILVER SULFADIAZINE 1 % EX CREA
TOPICAL_CREAM | Freq: Once | CUTANEOUS | Status: AC
  Filled 2021-10-13: qty 85

## 2021-10-13 MED ORDER — ONDANSETRON HCL 4 MG/2ML IJ SOLN
4.0000 mg | Freq: Four times a day (QID) | INTRAMUSCULAR | Status: DC | PRN
  Filled 2021-10-13: qty 2

## 2021-10-13 NOTE — Assessment & Plan Note (Signed)
-   Presumptive diagnosis secondary to COVID-19 infection - Check B12, if low administer IM supplement - Symptomatic support: Sodium chloride 125 mL/h, 1 day ordered vitamin C daily, as needed ondansetron p.o. and IV, as needed acetaminophen 650 mg p.o. every 6 hours as needed for mild pain and fever

## 2021-10-13 NOTE — Assessment & Plan Note (Signed)
-   Levothyroxine 100 mcg daily resumed 

## 2021-10-13 NOTE — Assessment & Plan Note (Signed)
-   Keppra 250 mg p.o. twice daily resumed - Ativan 2 mg IV as needed for seizures, 2 doses ordered with instructions for nursing staff to administer as appropriate and then let provider know

## 2021-10-13 NOTE — Assessment & Plan Note (Signed)
-   Home allopurinol and colchicine have not been resumed on admission

## 2021-10-13 NOTE — ED Provider Notes (Signed)
[facility] Provider Note    Event Date/Time   First MD Initiated Contact with Patient 10/13/21 1505     (approximate)   History   Weakness   HPI  [name] is a 88 y.o. female here with her husband for generalized weakness.  The patient was just diagnosed with COVID-19 on 9/30.  She states that since then, she has had progressive worsening generalized weakness and loss of appetite.  She presents with her husband who is also sick.  She states she has had little to no appetite and has had difficulty eating and drinking.  She has had some shortness of breath.  She has had lightheadedness and weakness with standing.  She has had to hold onto the walls to help walking.  Reports that she has had COVID vaccinations.  No history of COVID prior to her current episode.  No history of lung problems.     Physical Exam   Triage Vital Signs: ED Triage Vitals  Enc Vitals Group     BP 10/13/21 1425 135/78     Pulse Rate 10/13/21 1425 92     Resp 10/13/21 1425 18     Temp 10/13/21 1425 98.3 F (36.8 C)     Temp Source 10/13/21 1425 Oral     SpO2 10/13/21 1425 97 %     Weight 10/13/21 1422 127 lb 13.9 oz (58 kg)     Height 10/13/21 1422 '5\' 1"'$  (1.549 m)     Head Circumference --      Peak Flow --      Pain Score 10/13/21 1422 0     Pain Loc --      Pain Edu? --      Excl. in [place]? --     Most recent vital signs: Vitals:   10/13/21 2032 10/13/21 2119  BP: (!) 186/85 (!) 166/98  Pulse: 83 82  Resp: 18 18  Temp:  98.3 F (36.8 C)  SpO2: 99% 98%     General: Awake, no distress.  CV:  Good peripheral perfusion.  Regular rate and rhythm. Resp:  Normal effort.  Lungs clear to auscultation bilaterally. Abd:  No distention.  Other:  Mildly dry mucous membranes.   ED Results / Procedures / Treatments   Labs (all labs ordered are listed, but only abnormal results are displayed) Labs Reviewed  BASIC METABOLIC PANEL - Abnormal; Notable for the  following components:      Result Value   Glucose, Bld 114 (*)    BUN 24 (*)    GFR, Estimated 56 (*)    All other components within normal limits  URINALYSIS, ROUTINE W REFLEX MICROSCOPIC - Abnormal; Notable for the following components:   Color, Urine STRAW (*)    APPearance CLEAR (*)    All other components within normal limits  HEPATIC FUNCTION PANEL - Abnormal; Notable for the following components:   AST 47 (*)    All other components within normal limits  TROPONIN I (HIGH SENSITIVITY) - Abnormal; Notable for the following components:   Troponin I (High Sensitivity) 26 (*)    All other components within normal limits  CBC  LACTIC ACID, PLASMA  MAGNESIUM  CBC WITH DIFFERENTIAL/PLATELET  COMPREHENSIVE METABOLIC PANEL  VITAMIN J24     EKG    RADIOLOGY Chest x-ray: Clear, no focal disease   I also independently reviewed and agree with radiologist interpretations.   PROCEDURES:  Critical Care performed: No  .1-3 Lead EKG  Interpretation  Performed by: Duffy Bruce, MD Authorized by: Duffy Bruce, MD     Interpretation: normal     ECG rate:  60-90   ECG rate assessment: normal     Rhythm: sinus rhythm     Ectopy: none     Conduction: normal   Comments:     Indication: weakness Ultrasound ED Peripheral IV (Provider)  Date/Time: 10/13/2021 6:20 PM  Performed by: Duffy Bruce, MD Authorized by: Duffy Bruce, MD   Procedure details:    Indications: multiple failed IV attempts     Skin Prep: chlorhexidine gluconate     Location:  Right forearm   Angiocath:  20 G   Bedside Ultrasound Guided: Yes     Images: not archived     Patient tolerated procedure without complications: Yes     Dressing applied: Yes       MEDICATIONS ORDERED IN ED: Medications  molnupiravir EUA (LAGEVRIO) capsule 800 mg (has no administration in time range)  isosorbide mononitrate (IMDUR) 24 hr tablet 30 mg (has no administration in time range)  metoprolol succinate  (TOPROL-XL) 24 hr tablet 12.5 mg (12.5 mg Oral Given 10/13/21 2031)  traZODone (DESYREL) tablet 25 mg (has no administration in time range)  levothyroxine (SYNTHROID) tablet 100 mcg (has no administration in time range)  clopidogrel (PLAVIX) tablet 75 mg (has no administration in time range)  levETIRAcetam (KEPPRA) tablet 250 mg (has no administration in time range)  acetaminophen (TYLENOL) tablet 650 mg (has no administration in time range)  ondansetron (ZOFRAN) tablet 4 mg (4 mg Oral Given 10/13/21 2030)    Or  ondansetron (ZOFRAN) injection 4 mg ( Intravenous See Alternative 10/13/21 2030)  enoxaparin (LOVENOX) injection 40 mg (has no administration in time range)  guaiFENesin-dextromethorphan (ROBITUSSIN DM) 100-10 MG/5ML syrup 10 mL (has no administration in time range)  chlorpheniramine-HYDROcodone (TUSSIONEX) 10-8 MG/5ML suspension 5 mL (has no administration in time range)  albuterol (VENTOLIN HFA) 108 (90 Base) MCG/ACT inhaler 2 puff (has no administration in time range)  ascorbic acid (VITAMIN C) tablet 500 mg (500 mg Oral Given 10/13/21 2031)  senna-docusate (Senokot-S) tablet 1 tablet (has no administration in time range)  0.9 %  sodium chloride infusion ( Intravenous New Bag/Given 10/13/21 2035)  LORazepam (ATIVAN) injection 2 mg (has no administration in time range)  hydrALAZINE (APRESOLINE) tablet 10 mg (has no administration in time range)  nitroGLYCERIN (NITROSTAT) SL tablet 0.4 mg (has no administration in time range)  sodium chloride 0.9 % bolus 1,000 mL (0 mLs Intravenous Stopped 10/13/21 2033)  silver sulfADIAZINE (SILVADENE) 1 % cream ( Topical Given 10/13/21 2030)     IMPRESSION / MDM / ASSESSMENT AND PLAN / ED COURSE  I reviewed the triage vital signs and the nursing notes.                               The patient is on the cardiac monitor to evaluate for evidence of arrhythmia and/or significant heart rate changes.   Ddx:  Differential includes the following, with  pertinent life- or limb-threatening emergencies considered:  COVID-19 with generalized weakness, dehydration, anemia, UTI, PNA, ACS/ischemia, deconditioning  Patient's presentation is most consistent with acute presentation with potential threat to life or bodily function.  MDM:  88 yo F here with generalized weakness in setting of COVID-19. Pt weak, tired, having difficulty ambulating with poor appetite and PO intake. Labs overall reassuring. CBC without leukocytosis or anemia. CMP  unremarkable. CXR is clear. UA negative for UTI. Trop elevated at 23, likely from demand. Given pt's age, weakness, COVID-19, will admit as she does not feel safe going home which is reasonable.   MEDICATIONS GIVEN IN ED: Medications  molnupiravir EUA (LAGEVRIO) capsule 800 mg (has no administration in time range)  isosorbide mononitrate (IMDUR) 24 hr tablet 30 mg (has no administration in time range)  metoprolol succinate (TOPROL-XL) 24 hr tablet 12.5 mg (12.5 mg Oral Given 10/13/21 2031)  traZODone (DESYREL) tablet 25 mg (has no administration in time range)  levothyroxine (SYNTHROID) tablet 100 mcg (has no administration in time range)  clopidogrel (PLAVIX) tablet 75 mg (has no administration in time range)  levETIRAcetam (KEPPRA) tablet 250 mg (has no administration in time range)  acetaminophen (TYLENOL) tablet 650 mg (has no administration in time range)  ondansetron (ZOFRAN) tablet 4 mg (4 mg Oral Given 10/13/21 2030)    Or  ondansetron (ZOFRAN) injection 4 mg ( Intravenous See Alternative 10/13/21 2030)  enoxaparin (LOVENOX) injection 40 mg (has no administration in time range)  guaiFENesin-dextromethorphan (ROBITUSSIN DM) 100-10 MG/5ML syrup 10 mL (has no administration in time range)  chlorpheniramine-HYDROcodone (TUSSIONEX) 10-8 MG/5ML suspension 5 mL (has no administration in time range)  albuterol (VENTOLIN HFA) 108 (90 Base) MCG/ACT inhaler 2 puff (has no administration in time range)  ascorbic acid  (VITAMIN C) tablet 500 mg (500 mg Oral Given 10/13/21 2031)  senna-docusate (Senokot-S) tablet 1 tablet (has no administration in time range)  0.9 %  sodium chloride infusion ( Intravenous New Bag/Given 10/13/21 2035)  LORazepam (ATIVAN) injection 2 mg (has no administration in time range)  hydrALAZINE (APRESOLINE) tablet 10 mg (has no administration in time range)  nitroGLYCERIN (NITROSTAT) SL tablet 0.4 mg (has no administration in time range)  sodium chloride 0.9 % bolus 1,000 mL (0 mLs Intravenous Stopped 10/13/21 2033)  silver sulfADIAZINE (SILVADENE) 1 % cream ( Topical Given 10/13/21 2030)     Consults:  Hospitalist consulted for admission   EMR reviewed       FINAL CLINICAL IMPRESSION(S) / ED DIAGNOSES   Final diagnoses:  COVID-19  Generalized weakness     Rx / DC Orders   ED Discharge Orders     None        Note:  This document was prepared using Dragon voice recognition software and may include unintentional dictation errors.   Duffy Bruce, MD 10/13/21 2136

## 2021-10-13 NOTE — H&P (Signed)
History and Physical   [name] LNL:[id number] DOB: June 14, 1933 DOA: 10/13/2021  PCP: Idelle Crouch, MD  Outpatient Specialists: Dr. Rockey Situ, [facility] cardiology Patient coming from: Home  I have personally briefly reviewed patient's old medical records in [place].  Chief Concern: Weakness  HPI: Ms. [name] is a 88 year old female with hypertension, hypothyroid, hyperlipidemia, CAD with severe LAD and RCA disease status post PCI with DES in the right coronary artery in 05/31/2014, and rotablater on 06/01/14 to LAD, history of seizure, idiopathic gout, non-insulin-dependent diabetes mellitus, who presents emergency department for chief concerns of generalized weakness.  Patient lives at home with her husband, both are elderly and both were diagnosed with COVID-19 and unable to care for themselves.  Initial vitals in the emergency department showed temperature of 98.3, respiration rate of 18, heart rate of 92, blood pressure 135/78, SPO2 of 97% on room air.  Serum sodium is 142, potassium 3.8, chloride 108, bicarb 23, BUN of 24, serum creatinine of 0.98, GFR 56, nonfasting blood glucose 114, WBC 4.7, hemoglobin 13.2, platelets of 155.  High sensitive troponin was 26.  Lactic acid was 1.2.  UA was negative for nitrates or leukocytes.  Patient tested positive for COVID-19 on 10/11/2021  ED treatment: Silver sulfadiazine cream and sodium chloride 1 L bolus.  At bedside patient is able to tell me her full name, her age, the current calendar year and the current location.  She appears to be weak however does not appear to be in acute distress.  She reports that her and her husband tested positive for COVID-19 on 10/11/2021.  She states that she has had intractable nausea and vomiting, with 3 episodes of vomiting on Sunday, 10/12/2021.  Today she has had persistent nausea and has been too afraid to eat or take her medications since Sunday morning.  She denies chest pain,  shortness of breath, abdominal pain, dysuria, hematuria, diarrhea, swelling of her lower extremities.  She endorses fever of 1039/30/2023.  She endorses decreased taste sensation for the last 3 days.  She denies fever in the last 2 days.  She endorses chills, cough that is nonproductive.  Social history: She lives at home with her husband of 63 years.  She denies tobacco, EtOH, recreational drug use.  She is retired and formerly worked for [employer].  ROS: Constitutional: no weight change, no fever ENT/Mouth: no sore throat, no rhinorrhea Eyes: no eye pain, no vision changes Cardiovascular: no chest pain, no dyspnea,  no edema, no palpitations Respiratory: no cough, no sputum, no wheezing Gastrointestinal: no nausea, no vomiting, no diarrhea, no constipation Genitourinary: no urinary incontinence, no dysuria, no hematuria Musculoskeletal: no arthralgias, no myalgias Skin: no skin lesions, no pruritus, Neuro: + weakness, no loss of consciousness, no syncope Psych: no anxiety, no depression, + decrease appetite Heme/Lymph: no bruising, no bleeding  ED Course: Discussed with emergency medicine provider, patient requiring hospitalization for chief concerns of generalized weakness, unsafe discharge home.  Assessment/Plan  Principal Problem:   Weakness Active Problems:   Thyroid cancer (HCC)   Hyperlipidemia   Essential hypertension   Diabetes mellitus without complication (HCC)   Chronic anxiety   Idiopathic chronic gout of multiple sites without tophus   Intractable nausea and vomiting   Seizure (HCC)   Hypothyroidism   CAD (coronary artery disease)   COVID-19 virus infection   Assessment and Plan:  * Weakness - Presumptive diagnosis secondary to COVID-19 infection - Check B12, if low administer IM supplement -  Symptomatic support: Sodium chloride 125 mL/h, 1 day ordered vitamin C daily, as needed ondansetron p.o. and IV, as needed acetaminophen 650 mg p.o. every 6 hours as  needed for mild pain and fever  COVID-19 virus infection - Molnupiravir 800 mg p.o., twice daily, 5 days  CAD (coronary artery disease) - Resumed home Imdur 30 mg daily, metoprolol succinate 12.5 mg daily, Plavix 75 mg daily - Home rosuvastatin has not been resumed on admission due to taking antiviral for COVID-19 infection  Hypothyroidism - Levothyroxine 100 mcg daily resumed  Seizure ([place]) - Keppra 250 mg p.o. twice daily resumed - Ativan 2 mg IV as needed for seizures, 2 doses ordered with instructions for nursing staff to administer as appropriate and then let provider know  Intractable nausea and vomiting - Presumed etiology is secondary to COVID-19 infection - Symptomatic support with as needed ondansetron, p.o. and IV - Sodium chloride 125 mL/h, 1 day ordered  Idiopathic chronic gout of multiple sites without tophus - Home allopurinol and colchicine have not been resumed on admission  Essential hypertension - Isosorbide mononitrate 30 mg daily, metoprolol succinate 12.5 mg p.o. daily resumed  Chart reviewed.   DVT prophylaxis: Enoxaparin Code Status: Full code Diet: Heart healthy Family Communication: Updated spouse at bedside who is also being admitted to the hospital Disposition Plan: Pending clinical course, anticipate less than 2 night stay Consults called: PT Admission status: Telemetry medical, observation  Past Medical History:  Diagnosis Date   CAD (coronary artery disease)    a. lexiscan 08/2013: nl wall motion, no ischemia, EF 50-55%; b. cardiac cath 05/31/2014: mLAD 90%, dLAD 50%, dLCx 60%, 1st Mrg 80%, pRCA 60% s/p PCI/DES, mRCA 1st lesion 70%, mRCA 2nd 95% s/p PCI/long DES to cover entire mRCA, RPLB 40%;  c. 05/2014 Staged PCI of LAD w/ 2.5 x 15 mm Xience Alpine DES.   CAD (coronary artery disease) 02/11/2021   Carotid artery disease (HCC)    Chronic anxiety    Diverticulosis    Esophagitis    GERD (gastroesophageal reflux disease)    History of colon  polyps    History of echocardiogram    a. 08/2013: normal LVSF, nl RVSF, no valvular regurgitation or stenosis    Hyperlipidemia    Hypertension    Hypothyroidism    Mild reactive airways disease    Osteoarthritis    PONV (postoperative nausea and vomiting)    Rheumatoid arthritis ([place])    Stroke ([place])    a. 05/2014 pt c/o garbled speech following cath 5/19. MRI/A 5/26 showed left caudate infarct->conservative mgmt per neuro.   Thyroid cancer ([place])    a. s/p right sided hemi-thyroidectomy; b. planning for radioactive iodine tx; c. followed by Dr. Marcelene Butte   Type 2 diabetes mellitus [facility])    Past Surgical History:  Procedure Laterality Date   ABDOMINAL HYSTERECTOMY     APPENDECTOMY     AUGMENTATION MAMMAPLASTY     BACK SURGERY     BREAST IMPLANT REMOVAL  06/2001   CARDIAC CATHETERIZATION N/A 06/06/2014   Procedure: Coronary/Graft Atherectomy;  Surgeon: Wellington Hampshire, MD;  Location: [place] [facility];  Service: Cardiovascular;  Laterality: N/A;   CARDIAC CATHETERIZATION N/A 05/31/2014   Procedure: Left Heart Cath;  Surgeon: Minna Merritts, MD;  Location: [place] [facility];  Service: Cardiovascular;  Laterality: N/A;   CARDIAC CATHETERIZATION N/A 05/31/2014   Procedure: Coronary Stent Intervention;  Surgeon: Wellington Hampshire, MD;  Location: [place] [facility];  Service: Cardiovascular;  Laterality: N/A;   CARPAL TUNNEL RELEASE     "I've have a total of 7" (06/06/2014)   CATARACT EXTRACTION W/ INTRAOCULAR LENS  IMPLANT, BILATERAL Bilateral    CHOLECYSTECTOMY     CORONARY ANGIOPLASTY WITH STENT PLACEMENT  05/31/2014; 06/06/2014   "2; 1"   GANGLION CYST EXCISION Right    AC joint   LUMBAR LAMINECTOMY  X 6   MENISCUS REPAIR Bilateral    "2 on the right; 1 on the left"   ROTATOR CUFF REPAIR     "I think 2 left, 2 right"   THYROIDECTOMY     TONSILLECTOMY     TUBAL LIGATION     Social History:  reports that she has never smoked. She has never used smokeless tobacco. She reports  that she does not drink alcohol and does not use drugs.  Allergies  Allergen Reactions   Ciprofloxacin Diarrhea and Nausea And Vomiting   Codeine Diarrhea, Nausea And Vomiting and Other (See Comments)   Metronidazole Diarrhea, Nausea And Vomiting and Nausea Only   Other Hives and Other (See Comments)    Distress Reaction:  Muscle pain Reaction:  Muscle pain Reaction:  Muscle pain   Petrolatum-Zinc Oxide Hives   Sulfa Antibiotics Other (See Comments) and Hives    Distress Distress Distress Distress Other reaction(s): Not available   Neomycin-Bacitracin Zn-Polymyx Other (See Comments)    Reaction:  Unknown  Reaction:  Unknown  Reaction:  Unknown  Reaction:  Unknown   Bacitracin-Polymyxin B Rash   Celecoxib Itching, Rash and Other (See Comments)   Neomycin Itching   Statins Other (See Comments)    Reaction:  Muscle pain Reaction:  Muscle pain Reaction:  Muscle pain   Vioxx [Rofecoxib] Itching and Rash   Family History  Problem Relation Age of Onset   Heart disease Sister        stent placed x 3    Family history: Family history reviewed and not pertinent.  Prior to Admission medications   Medication Sig Start Date End Date Taking? Authorizing Provider  acetaminophen (TYLENOL) 500 MG tablet Take 500-1,000 mg by mouth every 6 (six) hours as needed for mild pain or moderate pain.    [provider]  allopurinol (ZYLOPRIM) 100 MG tablet TAKE 1 TABLET(100 MG) BY MOUTH TWICE DAILY 11/14/18   Newt Minion, MD  clopidogrel (PLAVIX) 75 MG tablet Take 1 tablet (75 mg total) by mouth daily. 09/30/21   Minna Merritts, MD  Colchicine (MITIGARE) 0.6 MG CAPS Take 0.6 mg by mouth daily as needed. 03/05/20   Minna Merritts, MD  furosemide (LASIX) 20 MG tablet Take 1 tablet (20 mg total) by mouth daily as needed for fluid. (Take with POTASSIUM) 09/30/21   Minna Merritts, MD  isosorbide mononitrate (IMDUR) 30 MG 24 hr tablet Take 1 tablet (30 mg total) by mouth daily. 09/30/21    Minna Merritts, MD  levETIRAcetam (KEPPRA) 250 MG tablet Take 1 tablet (250 mg total) by mouth 2 (two) times daily. 08/27/20   Lorella Nimrod, MD  levothyroxine (SYNTHROID) 100 MCG tablet Take 100 mcg by mouth daily. 09/17/21   [provider]  levothyroxine (SYNTHROID) 75 MCG tablet Take 75 mcg by mouth daily. 11/07/20   [provider]  metoprolol succinate (TOPROL-XL) 25 MG 24 hr tablet Take 0.5 tablets (12.5 mg total) by mouth daily. 09/30/21   Minna Merritts, MD  molnupiravir EUA (LAGEVRIO) 200 MG CAPS capsule Take 4 capsules (800 mg total) by  mouth 2 (two) times daily for 5 days. 10/11/21 10/16/21  Carrie Mew, MD  Multiple Vitamins-Minerals (ICAPS AREDS 2 PO) Take 1 tablet by mouth 2 (two) times daily.    [provider]  nitroGLYCERIN (NITROSTAT) 0.4 MG SL tablet Place 1 tablet (0.4 mg total) under the tongue every 5 (five) minutes as needed for chest pain. 09/30/21   Minna Merritts, MD  ondansetron (ZOFRAN-ODT) 4 MG disintegrating tablet Take 1 tablet (4 mg total) by mouth every 8 (eight) hours as needed for nausea or vomiting. 10/11/21   Carrie Mew, MD  potassium chloride (KLOR-CON) 10 MEQ tablet Take 1 tablet (10 mEq total) by mouth daily as needed (nutrient replenishment). (Take with FUROSEMIDE) 09/30/21   Minna Merritts, MD  rosuvastatin (CRESTOR) 10 MG tablet Take 10 mg by mouth daily. 04/03/21   [provider]  rosuvastatin (CRESTOR) 5 MG tablet TAKE 1 TABLET(5 MG) BY MOUTH DAILY 09/30/21   Minna Merritts, MD  traZODone (DESYREL) 50 MG tablet Take 0.5 tablets (25 mg total) by mouth at bedtime as needed for sleep. 02/14/21   Loletha Grayer, MD   Physical Exam: Vitals:   10/13/21 1422 10/13/21 1425 10/13/21 1706 10/13/21 1922  BP:  135/78 (!) 167/67 (!) 176/67  Pulse:  92 67 76  Resp:  '18 18 18  '$ Temp:  98.3 F (36.8 C)  98.7 F (37.1 C)  TempSrc:  Oral  Oral  SpO2:  97% 98% 98%  Weight: 58 kg     Height: '5\' 1"'$  (1.549 m)       Constitutional: appears age-appropriate, frail, NAD, calm, comfortable Eyes: PERRL, lids and conjunctivae normal ENMT: Mucous membranes are moist. Posterior pharynx clear of any exudate or lesions. Age-appropriate dentition. Hearing appropriate Neck: normal, supple, no masses, no thyromegaly Respiratory: clear to auscultation bilaterally, no wheezing, no crackles. Normal respiratory effort. No accessory muscle use.  Cardiovascular: Regular rate and rhythm, no murmurs / rubs / gallops. No extremity edema. 2+ pedal pulses. No carotid bruits.  Abdomen: no tenderness, no masses palpated, no hepatosplenomegaly. Bowel sounds positive.  Musculoskeletal: no clubbing / cyanosis. No joint deformity upper and lower extremities. Good ROM, no contractures, no atrophy. Normal muscle tone.  Skin: no rashes, lesions, ulcers. Bilateral lower extremity anterior skin changes consistent with chronic venous stasis Neurologic: Sensation intact. Strength 5/5 in all 4.  Psychiatric: Normal judgment and insight. Alert and oriented x 3. Normal mood.   EKG: independently reviewed, showing sinus rhythm with rate of 82, QTc 498  Chest x-ray on Admission: I personally reviewed and I agree with radiologist reading as below.  DG Chest 2 View  Result Date: 10/13/2021 CLINICAL DATA:  Weakness.  Tested positive for COVID. EXAM: CHEST - 2 VIEW COMPARISON:  08/25/2020. FINDINGS: Cardiac silhouette is normal in size and configuration. No mediastinal or hilar masses. No evidence of adenopathy. Lungs are hyperexpanded. Prominent interstitial markings at the bases. Lungs otherwise clear. No pleural effusion or pneumothorax. Skeletal structures are grossly intact. IMPRESSION: No active cardiopulmonary disease. Electronically Signed   By: Lajean Manes M.D.   On: 10/13/2021 15:59    Labs on Admission: I have personally reviewed following labs  CBC: Recent Labs  Lab 10/11/21 1558 10/13/21 1424  WBC 4.0 4.7  HGB 11.3* 13.2  HCT  35.3* 42.6  MCV 89.6 91.2  PLT 203 449   Basic Metabolic Panel: Recent Labs  Lab 10/11/21 1558 10/13/21 1424 10/13/21 1928  NA 140 142  --   K 4.5  3.8  --   CL 109 108  --   CO2 22 23  --   GLUCOSE 143* 114*  --   BUN 14 24*  --   CREATININE 0.68 0.98  --   CALCIUM 9.1 9.1  --   MG  --   --  1.8   GFR: Estimated Creatinine Clearance: 33.8 mL/min (by C-G formula based on SCr of 0.98 mg/dL).  Liver Function Tests: Recent Labs  Lab 10/11/21 1558 10/13/21 1732  AST 31 47*  ALT 15 23  ALKPHOS 71 61  BILITOT 0.7 0.6  PROT 7.0 6.9  ALBUMIN 4.0 3.9   Recent Labs  Lab 10/11/21 1558  LIPASE 45   Cardiac Enzymes: Recent Labs  Lab 10/11/21 1558  CKTOTAL 52   Urine analysis:    Component Value Date/Time   COLORURINE STRAW (A) 10/13/2021 1732   APPEARANCEUR CLEAR (A) 10/13/2021 1732   APPEARANCEUR Hazy 10/31/2013 1407   LABSPEC 1.008 10/13/2021 1732   LABSPEC 1.011 10/31/2013 1407   PHURINE 5.0 10/13/2021 1732   GLUCOSEU NEGATIVE 10/13/2021 1732   GLUCOSEU Negative 10/31/2013 1407   HGBUR NEGATIVE 10/13/2021 1732   BILIRUBINUR NEGATIVE 10/13/2021 1732   BILIRUBINUR Negative 10/31/2013 [place] 10/13/2021 1732   PROTEINUR NEGATIVE 10/13/2021 1732   UROBILINOGEN 0.2 09/20/2009 1206   NITRITE NEGATIVE 10/13/2021 1732   LEUKOCYTESUR NEGATIVE 10/13/2021 1732   LEUKOCYTESUR Trace 10/31/2013 1407   Dr. Tobie Poet [facility]  If 7PM-7AM, please contact overnight-coverage provider If 7AM-7PM, please contact day coverage provider [email or web address]  10/13/2021, 8:18 PM

## 2021-10-13 NOTE — Assessment & Plan Note (Addendum)
-   Resumed home Imdur 30 mg daily, metoprolol succinate 12.5 mg daily, Plavix 75 mg daily - Home rosuvastatin has not been resumed on admission due to taking antiviral for COVID-19 infection

## 2021-10-13 NOTE — Assessment & Plan Note (Signed)
-   Molnupiravir 800 mg p.o., twice daily, 5 days

## 2021-10-13 NOTE — ED Notes (Signed)
Patient AOX4. Resp even, unlabored on RA. NSR on monitor. Denies pain or shortness of breath. External female catheter in place. NS bolus infusing without signs or symptoms of infiltration.

## 2021-10-13 NOTE — Assessment & Plan Note (Signed)
-   Isosorbide mononitrate 30 mg daily, metoprolol succinate 12.5 mg p.o. daily resumed

## 2021-10-13 NOTE — ED Triage Notes (Signed)
Arrives via [facility] with C/O weakness.  Seen through ED on Saturday, tested positive for COVID.  Per EMS report, VS wnl.

## 2021-10-13 NOTE — Hospital Course (Addendum)
Ms. [name] is a 88 year old female with hypertension, hypothyroid, hyperlipidemia, CAD with severe LAD and RCA disease status post PCI with DES in the right coronary artery in 05/31/2014, and rotablater on 06/01/14 to LAD, history of seizure, idiopathic gout, non-insulin-dependent diabetes mellitus, who presents emergency department for chief concerns of generalized weakness.  Patient lives at home with her husband, both are elderly and both were diagnosed with COVID-19 and unable to care for themselves.  Initial vitals in the emergency department showed temperature of 98.3, respiration rate of 18, heart rate of 92, blood pressure 135/78, SPO2 of 97% on room air.  Serum sodium is 142, potassium 3.8, chloride 108, bicarb 23, BUN of 24, serum creatinine of 0.98, GFR 56, nonfasting blood glucose 114, WBC 4.7, hemoglobin 13.2, platelets of 155.  High sensitive troponin was 26.  Lactic acid was 1.2.  UA was negative for nitrates or leukocytes.  Patient tested positive for COVID-19 on 10/11/2021  ED treatment: Silver sulfadiazine cream and sodium chloride 1 L bolus.

## 2021-10-13 NOTE — Assessment & Plan Note (Signed)
-   Presumed etiology is secondary to COVID-19 infection - Symptomatic support with as needed ondansetron, p.o. and IV - Sodium chloride 125 mL/h, 1 day ordered

## 2021-10-14 ENCOUNTER — Encounter: Payer: Self-pay | Admitting: Internal Medicine

## 2021-10-14 DIAGNOSIS — U071 COVID-19: Secondary | ICD-10-CM | POA: Diagnosis not present

## 2021-10-14 DIAGNOSIS — Z79899 Other long term (current) drug therapy: Secondary | ICD-10-CM | POA: Diagnosis not present

## 2021-10-14 DIAGNOSIS — Z7984 Long term (current) use of oral hypoglycemic drugs: Secondary | ICD-10-CM | POA: Diagnosis not present

## 2021-10-14 DIAGNOSIS — Z8249 Family history of ischemic heart disease and other diseases of the circulatory system: Secondary | ICD-10-CM | POA: Diagnosis not present

## 2021-10-14 DIAGNOSIS — R112 Nausea with vomiting, unspecified: Secondary | ICD-10-CM

## 2021-10-14 DIAGNOSIS — Z79890 Hormone replacement therapy (postmenopausal): Secondary | ICD-10-CM | POA: Diagnosis not present

## 2021-10-14 DIAGNOSIS — F419 Anxiety disorder, unspecified: Secondary | ICD-10-CM | POA: Diagnosis present

## 2021-10-14 DIAGNOSIS — I5032 Chronic diastolic (congestive) heart failure: Secondary | ICD-10-CM | POA: Diagnosis present

## 2021-10-14 DIAGNOSIS — I2510 Atherosclerotic heart disease of native coronary artery without angina pectoris: Secondary | ICD-10-CM | POA: Diagnosis present

## 2021-10-14 DIAGNOSIS — E876 Hypokalemia: Secondary | ICD-10-CM

## 2021-10-14 DIAGNOSIS — G40909 Epilepsy, unspecified, not intractable, without status epilepticus: Secondary | ICD-10-CM | POA: Diagnosis present

## 2021-10-14 DIAGNOSIS — T24011A Burn of unspecified degree of right thigh, initial encounter: Secondary | ICD-10-CM | POA: Diagnosis present

## 2021-10-14 DIAGNOSIS — E890 Postprocedural hypothyroidism: Secondary | ICD-10-CM | POA: Diagnosis present

## 2021-10-14 DIAGNOSIS — M1A09X0 Idiopathic chronic gout, multiple sites, without tophus (tophi): Secondary | ICD-10-CM | POA: Diagnosis present

## 2021-10-14 DIAGNOSIS — X100XXA Contact with hot drinks, initial encounter: Secondary | ICD-10-CM | POA: Diagnosis present

## 2021-10-14 DIAGNOSIS — M069 Rheumatoid arthritis, unspecified: Secondary | ICD-10-CM | POA: Diagnosis present

## 2021-10-14 DIAGNOSIS — R569 Unspecified convulsions: Secondary | ICD-10-CM

## 2021-10-14 DIAGNOSIS — Z9882 Breast implant status: Secondary | ICD-10-CM | POA: Diagnosis not present

## 2021-10-14 DIAGNOSIS — Z955 Presence of coronary angioplasty implant and graft: Secondary | ICD-10-CM | POA: Diagnosis not present

## 2021-10-14 DIAGNOSIS — E119 Type 2 diabetes mellitus without complications: Secondary | ICD-10-CM | POA: Diagnosis present

## 2021-10-14 DIAGNOSIS — Z85850 Personal history of malignant neoplasm of thyroid: Secondary | ICD-10-CM | POA: Diagnosis not present

## 2021-10-14 DIAGNOSIS — R531 Weakness: Secondary | ICD-10-CM | POA: Diagnosis not present

## 2021-10-14 DIAGNOSIS — I110 Hypertensive heart disease with heart failure: Secondary | ICD-10-CM | POA: Diagnosis present

## 2021-10-14 DIAGNOSIS — Z8673 Personal history of transient ischemic attack (TIA), and cerebral infarction without residual deficits: Secondary | ICD-10-CM | POA: Diagnosis not present

## 2021-10-14 DIAGNOSIS — Z86010 Personal history of colonic polyps: Secondary | ICD-10-CM | POA: Diagnosis not present

## 2021-10-14 DIAGNOSIS — E785 Hyperlipidemia, unspecified: Secondary | ICD-10-CM | POA: Diagnosis present

## 2021-10-14 DIAGNOSIS — Z789 Other specified health status: Secondary | ICD-10-CM | POA: Diagnosis not present

## 2021-10-14 LAB — COMPREHENSIVE METABOLIC PANEL
ALT: 28 U/L (ref 0–44)
AST: 51 U/L — ABNORMAL HIGH (ref 15–41)
Albumin: 3.2 g/dL — ABNORMAL LOW (ref 3.5–5.0)
Alkaline Phosphatase: 55 U/L (ref 38–126)
Anion gap: 7 (ref 5–15)
BUN: 21 mg/dL (ref 8–23)
CO2: 25 mmol/L (ref 22–32)
Calcium: 8.1 mg/dL — ABNORMAL LOW (ref 8.9–10.3)
Chloride: 109 mmol/L (ref 98–111)
Creatinine, Ser: 0.7 mg/dL (ref 0.44–1.00)
GFR, Estimated: >60 mL/min (ref >60)
Glucose, Bld: 74 mg/dL (ref 70–99)
Potassium: 2.9 mmol/L — ABNORMAL LOW (ref 3.5–5.1)
Sodium: 141 mmol/L (ref 135–145)
Total Bilirubin: 0.4 mg/dL (ref 0.3–1.2)
Total Protein: 5.9 g/dL — ABNORMAL LOW (ref 6.5–8.1)

## 2021-10-14 LAB — CBC WITH DIFFERENTIAL/PLATELET
Abs Immature Granulocytes: 0.01 10*3/uL (ref 0.00–0.07)
Basophils Absolute: 0 10*3/uL (ref 0.0–0.1)
Basophils Relative: 0 %
Eosinophils Absolute: 0 10*3/uL (ref 0.0–0.5)
Eosinophils Relative: 0 %
HCT: 34.2 % — ABNORMAL LOW (ref 36.0–46.0)
Hemoglobin: 10.8 g/dL — ABNORMAL LOW (ref 12.0–15.0)
Immature Granulocytes: 0 %
Lymphocytes Relative: 25 %
Lymphs Abs: 0.9 10*3/uL (ref 0.7–4.0)
MCH: 28.1 pg (ref 26.0–34.0)
MCHC: 31.6 g/dL (ref 30.0–36.0)
MCV: 88.8 fL (ref 80.0–100.0)
Monocytes Absolute: 0.5 10*3/uL (ref 0.1–1.0)
Monocytes Relative: 12 %
Neutro Abs: 2.3 10*3/uL (ref 1.7–7.7)
Neutrophils Relative %: 63 %
Platelets: 175 10*3/uL (ref 150–400)
RBC: 3.85 MIL/uL — ABNORMAL LOW (ref 3.87–5.11)
RDW: 13.9 % (ref 11.5–15.5)
WBC: 3.8 10*3/uL — ABNORMAL LOW (ref 4.0–10.5)
nRBC: 0 % (ref 0.0–0.2)

## 2021-10-14 LAB — PHOSPHORUS: Phosphorus: 2.9 mg/dL (ref 2.5–4.6)

## 2021-10-14 LAB — VITAMIN B12: Vitamin B-12: 492 pg/mL (ref 180–914)

## 2021-10-14 LAB — MAGNESIUM: Magnesium: 1.6 mg/dL — ABNORMAL LOW (ref 1.7–2.4)

## 2021-10-14 MED ORDER — POTASSIUM CHLORIDE IN NACL 40-0.9 MEQ/L-% IV SOLN
INTRAVENOUS | Status: DC
  Filled 2021-10-14 (×2): qty 1000

## 2021-10-14 MED ORDER — POTASSIUM CHLORIDE CRYS ER 20 MEQ PO TBCR
40.0000 meq | EXTENDED_RELEASE_TABLET | Freq: Once | ORAL | Status: AC
  Administered 2021-10-14: 40 meq via ORAL
  Filled 2021-10-14: qty 2

## 2021-10-14 MED ORDER — MAGNESIUM SULFATE 2 GM/50ML IV SOLN
2.0000 g | Freq: Once | INTRAVENOUS | Status: AC
  Administered 2021-10-14: 2 g via INTRAVENOUS
  Filled 2021-10-14: qty 50

## 2021-10-14 MED ORDER — ORAL CARE MOUTH RINSE: 15.0000 mL | OROMUCOSAL | Status: DC | PRN

## 2021-10-14 MED ORDER — POTASSIUM CHLORIDE IN NACL 40-0.9 MEQ/L-% IV SOLN
INTRAVENOUS | Status: AC
  Administered 2021-10-14: 50 mL/h via INTRAVENOUS
  Filled 2021-10-14 (×2): qty 1000

## 2021-10-14 NOTE — Progress Notes (Addendum)
Progress Note    Fleda Pagel  CNO:[id number] DOB: 05/09/1933  DOA: 10/13/2021 PCP: Idelle Crouch, MD      Brief Narrative:    Medical records reviewed and are as summarized below:  [name] is a 88 y.o. female with hypertension, hypothyroid, hyperlipidemia, CAD with severe LAD and RCA disease status post PCI with DES in the right coronary artery in 05/31/2014, and rotoblade on 06/01/14 to LAD, history of seizure, idiopathic gout, non-insulin-dependent diabetes mellitus, who presents emergency department for chief concerns of generalized weakness, loss of appetite, nausea and vomiting.  She and her husband tested positive for COVID-19 infection on 10/11/2021.  She and her husband came to the hospital because they could not take care of themselves at home.  She was admitted to the hospital for generalized weakness, intractable nausea and vomiting secondary to COVID-19 infection.  She was treated with molnupiravir and IV fluids.  She also had hypokalemia and hypomagnesemia that were repleted.   Assessment/Plan:   Principal Problem:   Weakness Active Problems:   Thyroid cancer ([place])   Hyperlipidemia   Essential hypertension   Diabetes mellitus without complication (HCC)   Chronic anxiety   Idiopathic chronic gout of multiple sites without tophus   Intractable nausea and vomiting   Seizure (HCC)   Hypothyroidism   CAD (coronary artery disease)   COVID-19 virus infection   Hypokalemia   Hypomagnesemia    Body mass index is 24.16 kg/m.   COVID-19 infection: Continue airborne and contact isolation.  Continue molnupiravir.   Hypokalemia and hypomagnesemia: Replete potassium and magnesium intravenously.  She has been started on normal saline with 40 mEq KCl at 50 ml/h.  Replete magnesium with IV magnesium sulfate.  Generalized weakness: PT evaluation  CAD s/p coronary artery stent, history of stroke: Continue Plavix  Seizure disorder: Continue  Keppra  Other comorbidities include hypertension, rheumatoid arthritis, gout, anxiety, hypothyroidism   Plan discussed with Amy, daughter, over the phone.  She stated concerns about both of them living at home.  She said that they have refused rehab in the past and preferred to live in their own apartment because they have cats at home.  She also mentioned that both of them have fallen several times in the past, but they have refused to have therapist come in on a regular basis to assist them at home.  She said that her mother will not be able to care for herself alone at home without her husband, and was hoping that her father will be ready for discharge on the same day as her mother so that they can be there for one another.  She said her mother has macular degeneration and does not do well alone at home. OT consult has been requested.   Diet Order             Diet Heart Room service appropriate? Yes; Fluid consistency: Thin  Diet effective now                         Consultants: None  Procedures: None    Medications:    vitamin C  500 mg Oral Daily   clopidogrel  75 mg Oral Daily   enoxaparin (LOVENOX) injection  40 mg Subcutaneous Q24H   isosorbide mononitrate  30 mg Oral Daily   levETIRAcetam  250 mg Oral BID   levothyroxine  100 mcg Oral Q0600   metoprolol succinate  12.5 mg  Oral Daily   molnupiravir EUA  4 capsule Oral BID   Continuous Infusions:  0.9 % NaCl with KCl 40 mEq / L       Anti-infectives (From admission, onward)    Start     Dose/Rate Route Frequency Ordered Stop   10/13/21 2200  molnupiravir EUA (LAGEVRIO) capsule 800 mg        4 capsule Oral 2 times daily 10/13/21 1849 10/18/21 2159              Family Communication/Anticipated D/C date and plan/Code Status   DVT prophylaxis: enoxaparin (LOVENOX) injection 40 mg Start: 10/13/21 2200 Place TED hose Start: 10/13/21 1848     Code Status: Full Code  Family Communication:  Plan discussed with Amy, daughter. Disposition Plan: Plan to discharge home tomorrow   Status is: Observation The patient will require care spanning > 2 midnights and should be moved to inpatient because: COVID-19 infection, management of hypokalemia and hypomagnesemia.       Subjective:   Interval events noted.  She feels better today.  No vomiting, abdominal pain or diarrhea.  She had a bowel movement this morning but it was not watery.  Nurse tech was at the bedside.  Objective:    Vitals:   10/14/21 0122 10/14/21 0516 10/14/21 0803 10/14/21 1151  BP: (!) 167/55 (!) 173/59 (!) 152/51 (!) 113/51  Pulse: 68 73 67 69  Resp: '17 18 18 16  '$ Temp: 98.4 F (36.9 C) 98.8 F (37.1 C) 98.4 F (36.9 C) 98.4 F (36.9 C)  TempSrc:   Oral   SpO2: 98% 97% 96% 98%  Weight:      Height:       No data found.   Intake/Output Summary (Last 24 hours) at 10/14/2021 1229 Last data filed at 10/14/2021 0805 Gross per 24 hour  Intake 1619.63 ml  Output 800 ml  Net 819.63 ml   Filed Weights   10/13/21 1422  Weight: 58 kg    Exam:  GEN: NAD SKIN: Warm and dry EYES: EOMI ENT: MMM CV: RRR PULM: CTA B ABD: soft, ND, NT, +BS CNS: AAO x 3, non focal EXT: No edema or tenderness        Data Reviewed:   I have personally reviewed following labs and imaging studies:  Labs: Labs show the following:   Basic Metabolic Panel: Recent Labs  Lab 10/11/21 1558 10/13/21 1424 10/13/21 1928 10/14/21 0433  NA 140 142  --  141  K 4.5 3.8  --  2.9*  CL 109 108  --  109  CO2 22 23  --  25  GLUCOSE 143* 114*  --  74  BUN 14 24*  --  21  CREATININE 0.68 0.98  --  0.70  CALCIUM 9.1 9.1  --  8.1*  MG  --   --  1.8 1.6*  PHOS  --   --   --  2.9   GFR Estimated Creatinine Clearance: 41.4 mL/min (by C-G formula based on SCr of 0.7 mg/dL). Liver Function Tests: Recent Labs  Lab 10/11/21 1558 10/13/21 1732 10/14/21 0433  AST 31 47* 51*  ALT '15 23 28  '$ ALKPHOS 71 61 55  BILITOT  0.7 0.6 0.4  PROT 7.0 6.9 5.9*  ALBUMIN 4.0 3.9 3.2*   Recent Labs  Lab 10/11/21 1558  LIPASE 45   No results for input(s): "AMMONIA" in the last 168 hours. Coagulation profile No results for input(s): "INR", "PROTIME" in the last 168  hours.  CBC: Recent Labs  Lab 10/11/21 1558 10/13/21 1424 10/14/21 0433  WBC 4.0 4.7 3.8*  NEUTROABS  --   --  2.3  HGB 11.3* 13.2 10.8*  HCT 35.3* 42.6 34.2*  MCV 89.6 91.2 88.8  PLT 203 155 175   Cardiac Enzymes: Recent Labs  Lab 10/11/21 1558  CKTOTAL 52   BNP (last 3 results) No results for input(s): "PROBNP" in the last 8760 hours. CBG: Recent Labs  Lab 10/13/21 2152  GLUCAP 142*   D-Dimer: No results for input(s): "DDIMER" in the last 72 hours. Hgb A1c: No results for input(s): "HGBA1C" in the last 72 hours. Lipid Profile: No results for input(s): "CHOL", "HDL", "LDLCALC", "TRIG", "CHOLHDL", "LDLDIRECT" in the last 72 hours. Thyroid function studies: No results for input(s): "TSH", "T4TOTAL", "T3FREE", "THYROIDAB" in the last 72 hours.  Invalid input(s): "FREET3" Anemia work up: Recent Labs    10/13/21 2211  VITAMINB12 492   Sepsis Labs: Recent Labs  Lab 10/11/21 1558 10/13/21 1424 10/13/21 1732 10/14/21 0433  WBC 4.0 4.7  --  3.8*  LATICACIDVEN  --   --  1.2  --     Microbiology Recent Results (from the past 240 hour(s))  Resp Panel by RT-PCR (Flu A&B, Covid) Anterior Nasal Swab     Status: Abnormal   Collection Time: 10/11/21  3:58 PM   Specimen: Anterior Nasal Swab  Result Value Ref Range Status   SARS Coronavirus 2 by RT PCR POSITIVE (A) NEGATIVE Final    Comment: (NOTE) SARS-CoV-2 target nucleic acids are DETECTED.  The SARS-CoV-2 RNA is generally detectable in upper respiratory specimens during the acute phase of infection. Positive results are indicative of the presence of the identified virus, but do not rule out bacterial infection or co-infection with other pathogens not detected by the  test. Clinical correlation with patient history and other diagnostic information is necessary to determine patient infection status. The expected result is Negative.  Fact Sheet for Patients: EntrepreneurPulse.com.au  Fact Sheet for Healthcare Providers: IncredibleEmployment.be  This test is not yet approved or cleared by the [place] FDA and  has been authorized for detection and/or diagnosis of SARS-CoV-2 by FDA under an Emergency Use Authorization (EUA).  This EUA will remain in effect (meaning this test can be used) for the duration of  the COVID-19 declaration under Section 564(b)(1) of the A ct, 21 U.S.C. section 360bbb-3(b)(1), unless the authorization is terminated or revoked sooner.     Influenza A by PCR NEGATIVE NEGATIVE Final   Influenza B by PCR NEGATIVE NEGATIVE Final    Comment: (NOTE) The Xpert Xpress SARS-CoV-2/FLU/RSV plus assay is intended as an aid in the diagnosis of influenza from Nasopharyngeal swab specimens and should not be used as a sole basis for treatment. Nasal washings and aspirates are unacceptable for Xpert Xpress SARS-CoV-2/FLU/RSV testing.  Fact Sheet for Patients: EntrepreneurPulse.com.au  Fact Sheet for Healthcare Providers: IncredibleEmployment.be  This test is not yet approved or cleared by the [place] FDA and has been authorized for detection and/or diagnosis of SARS-CoV-2 by FDA under an Emergency Use Authorization (EUA). This EUA will remain in effect (meaning this test can be used) for the duration of the COVID-19 declaration under Section 564(b)(1) of the Act, 21 U.S.C. section 360bbb-3(b)(1), unless the authorization is terminated or revoked.  Performed at [facility], [address], [place], [place] [address]     Procedures and diagnostic studies:  DG Chest 2 View  Result Date: 10/13/2021 CLINICAL  DATA:  Weakness.  Tested  positive for COVID. EXAM: CHEST - 2 VIEW COMPARISON:  08/25/2020. FINDINGS: Cardiac silhouette is normal in size and configuration. No mediastinal or hilar masses. No evidence of adenopathy. Lungs are hyperexpanded. Prominent interstitial markings at the bases. Lungs otherwise clear. No pleural effusion or pneumothorax. Skeletal structures are grossly intact. IMPRESSION: No active cardiopulmonary disease. Electronically Signed   By: Lajean Manes M.D.   On: 10/13/2021 15:59               LOS: 0 days   Channon Ambrosini  [facility]   Pager on [email or web address]. If 7PM-7AM, please contact night-coverage at [email or web address]     10/14/2021, 12:29 PM

## 2021-10-14 NOTE — Evaluation (Addendum)
Occupational Therapy Evaluation Patient Details Name: [name] MRN: [id number] DOB: 1933/10/21 Today's Date: 10/14/2021   History of Present Illness Ms. [name] is a 88 year old female with hypertension, hypothyroid, hyperlipidemia, CAD with severe LAD and RCA disease status post PCI with DES in the right coronary artery in 05/31/2014, and rotablater on 06/01/14 to LAD, history of seizure, idiopathic gout, non-insulin-dependent diabetes mellitus, who presents emergency department for chief concerns of generalized weakness.   Clinical Impression   Patient seen for OT evaluation. Patient presenting with decreased strength, balance, and endurance impacting safety and independence in ADLs. At baseline, pt was independent with ADLs, shared IADL responsibility with husband, and was Mod I for functional mobility using rollator. Patient currently functioning at supervision for bed mobility, Min guard for simulated toilet transfer, Min guard for functional mobility using RW, Min guard for seated LB dressing, and set up-supervision for standing grooming tasks. Patient will benefit from acute OT to increase overall independence in the areas of ADLs and functional mobility in order to safely discharge home. Pt could benefit from [facility] following D/C to decrease falls risk, improve balance, and maximize independence in self-care within own home environment.      Recommendations for follow up therapy are one component of a multi-disciplinary discharge planning process, led by the attending physician.  Recommendations may be updated based on patient status, additional functional criteria and insurance authorization.   Follow Up Recommendations  Home health OT    Assistance Recommended at Discharge PRN  Patient can return home with the following A little help with walking and/or transfers;A little help with bathing/dressing/bathroom;Assist for transportation;Assistance with cooking/housework;Help  with stairs or ramp for entrance    Functional Status Assessment  Patient has had a recent decline in their functional status and demonstrates the ability to make significant improvements in function in a reasonable and predictable amount of time.  Equipment Recommendations  BSC/3in1    Recommendations for Other Services       Precautions / Restrictions Precautions Precautions: Fall Restrictions Weight Bearing Restrictions: No      Mobility Bed Mobility Overal bed mobility: Needs Assistance Bed Mobility: Supine to Sit, Sit to Supine     Supine to sit: Supervision Sit to supine: Supervision, HOB elevated   General bed mobility comments: use of bed rails    Transfers Overall transfer level: Needs assistance Equipment used: Rolling walker (2 wheels) Transfers: Sit to/from Stand Sit to Stand: Min guard           General transfer comment: STS from EOB      Balance Overall balance assessment: Needs assistance Sitting-balance support: Feet supported, Bilateral upper extremity supported Sitting balance-Leahy Scale: Fair Sitting balance - Comments: Min A initially to correct posterior lean, improved to Min guard sitting EOB Postural control: Posterior lean Standing balance support: Bilateral upper extremity supported, Single extremity supported, During functional activity Standing balance-Leahy Scale: Fair Standing balance comment: BUE support from RW during functional mobility, single UE supported while standing at sink                           ADL either performed or assessed with clinical judgement   ADL Overall ADL's : Needs assistance/impaired     Grooming: Wash/dry face;Set up;Supervision/safety;Standing               Lower Body Dressing: Min guard;Sitting/lateral leans Lower Body Dressing Details (indicate cue type and reason): able to don/doff socks  sitting EOB using figure 4 technique Toilet Transfer: Min guard;Rolling walker (2  wheels) Toilet Transfer Details (indicate cue type and reason): simulated with STS from EOB         Functional mobility during ADLs: Min guard;Rolling walker (2 wheels) (~73f in the room, Min A for RW when pt "bumped into" BSC)       Vision Baseline Vision/History: 1 Wears glasses Patient Visual Report: No change from baseline Additional Comments: 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     Perception     Praxis      Pertinent Vitals/Pain Pain Assessment Pain Assessment: No/denies pain     Hand Dominance Right   Extremity/Trunk Assessment Upper Extremity Assessment Upper Extremity Assessment: Generalized weakness   Lower Extremity Assessment Lower Extremity Assessment: Generalized weakness       Communication Communication Communication: No difficulties   Cognition Arousal/Alertness: Awake/alert Behavior During Therapy: WFL for tasks assessed/performed Overall Cognitive Status: Within Functional Limits for tasks assessed                                       General Comments       Exercises Other Exercises Other Exercises: OT provided education re: role of OT, OT POC, post acute recs, sitting up for all meals, EOB/OOB mobility with assistance, home/fall safety.     Shoulder Instructions      Home Living Family/patient expects to be discharged to:: Private residence Living Arrangements: Spouse/significant other Available Help at Discharge: Family Type of Home: House Home Access: Level entry     Home Layout: One level     Bathroom Shower/Tub: WOccupational psychologist [place] RConservation officer, nature(2 wheels);Rollator (4 wheels);Shower seat   Additional Comments: [facility] having multiple falls as well [facility]. Pt has been alone at home with family and friends "checking in".      Prior Functioning/Environment Prior Level of Function : Independent/Modified  Independent             Mobility Comments: Pt reports mod I with rollator but multiple falls at home. ADLs Comments: Pt reports ind with self care tasks and her and husband split IADLs. Husband provides transportation.         OT Problem List: Decreased strength;Decreased activity tolerance;Impaired balance (sitting and/or standing);Impaired vision/perception      OT Treatment/Interventions: Self-care/ADL training;Therapeutic activities;Therapeutic exercise;Energy conservation;DME and/or AE instruction;Patient/family education;Balance training;Visual/perceptual remediation/compensation    OT Goals(Current goals can be found in the care plan section) Acute Rehab OT Goals Patient Stated Goal: go home OT Goal Formulation: With patient Time For Goal Achievement: 10/28/21 Potential to Achieve Goals: Good ADL Goals Pt Will Perform Grooming: with modified independence;standing Pt Will Perform Lower Body Dressing: with modified independence;sit to/from stand;sitting/lateral leans Pt Will Transfer to Toilet: with modified independence;ambulating;bedside commode Pt Will Perform Toileting - Clothing Manipulation and hygiene: with modified independence;sit to/from stand Additional ADL Goal #1: Pt will verbalize 2-3 energy conservation techniques with Min VC  OT Frequency: Min 2X/week    Co-evaluation              AM-PAC OT "6 Clicks" Daily Activity     Outcome Measure Help from another person eating meals?: None Help from another person taking care of personal grooming?: A Little Help from another person toileting, which includes using  toliet, bedpan, or urinal?: A Little Help from another person bathing (including washing, rinsing, drying)?: A Little Help from another person to put on and taking off regular upper body clothing?: None Help from another person to put on and taking off regular lower body clothing?: A Little 6 Click Score: 20   End of Session Equipment Utilized  During Treatment: Gait belt;Rolling walker (2 wheels) Nurse Communication: Mobility status  Activity Tolerance: Patient tolerated treatment well Patient left: in bed;with call bell/phone within reach;with bed alarm set  OT Visit Diagnosis: Unsteadiness on feet (R26.81);History of falling (Z91.81);Muscle weakness (generalized) (M62.81)                Time: 0932-6712 OT Time Calculation (min): 29 min Charges:  OT General Charges $OT Visit: 1 Visit OT Evaluation $OT Eval Low Complexity: 1 Low  [facility] MS, OTR/L ascom [phone]  10/14/21, 2:33 PM
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
## 2021-10-14 NOTE — Evaluation (Signed)
Physical Therapy Evaluation Patient Details Name: [name] MRN: [id number] DOB: May 04, 1933 Today's Date: 10/14/2021  History of Present Illness  Ms. [name] is a 88 year old female with hypertension, hypothyroid, hyperlipidemia, CAD with severe LAD and RCA disease status post PCI with DES in the right coronary artery in 05/31/2014, and rotablater on 06/01/14 to LAD, history of seizure, idiopathic gout, non-insulin-dependent diabetes mellitus, who presents emergency department for chief concerns of generalized weakness.  Clinical Impression  Pt is a pleasant 88 year old female who was admitted for weakness secondary to covid. Pt performs bed mobility with supervision, transfers with min assist, and ambulation with cga and RW. Pt demonstrates deficits with strength/mobility/endurance. Pt is eager to improve function and is open to thinking about HHPT services. Would benefit from skilled PT to address above deficits and promote optimal return to PLOF. Recommend transition to [place] upon discharge from acute hospitalization.      Recommendations for follow up therapy are one component of a multi-disciplinary discharge planning process, led by the attending physician.  Recommendations may be updated based on patient status, additional functional criteria and insurance authorization.  Follow Up Recommendations Home health PT (pt currently declining services at this time)      Assistance Recommended at Discharge PRN  Patient can return home with the following  A little help with walking and/or transfers;A little help with bathing/dressing/bathroom    Equipment Recommendations None recommended by PT  Recommendations for Other Services       Functional Status Assessment Patient has had a recent decline in their functional status and demonstrates the ability to make significant improvements in function in a reasonable and predictable amount of time.     Precautions / Restrictions  Precautions Precautions: Fall Restrictions Weight Bearing Restrictions: No      Mobility  Bed Mobility Overal bed mobility: Needs Assistance Bed Mobility: Supine to Sit, Sit to Supine     Supine to sit: Supervision     General bed mobility comments: use of bed rails    Transfers Overall transfer level: Needs assistance Equipment used: Rolling walker (2 wheels) Transfers: Sit to/from Stand Sit to Stand: Min assist           General transfer comment: needs cues for hand placement. Once standing, upright posture. RW used    Ambulation/Gait Ambulation/Gait assistance: Counsellor (Feet): 40 Feet Assistive device: Rolling walker (2 wheels) Gait Pattern/deviations: Step-through pattern       General Gait Details: ambulated around room. Able to avoid obstacles and demonstrates reciprocal gait pattern. Safe technique with use of RW  Stairs            Wheelchair Mobility    Modified Rankin (Stroke Patients Only)       Balance Overall balance assessment: Needs assistance Sitting-balance support: Feet supported, Bilateral upper extremity supported Sitting balance-Leahy Scale: Fair     Standing balance support: Bilateral upper extremity supported, Single extremity supported, During functional activity Standing balance-Leahy Scale: Fair                               Pertinent Vitals/Pain Pain Assessment Pain Assessment: No/denies pain    Home Living Family/patient expects to be discharged to:: Private residence Living Arrangements: Spouse/significant other Available Help at Discharge: Family Type of Home: House Home Access: Level entry       Home Layout: One level Home Equipment: Conservation officer, nature (2 wheels);Rollator (4  wheels);Shower seat Additional Comments: Husband having multiple falls as well and is also currently admitted at [facility] for weakness and COVID. Pt has been alone at home with family and friends "checking in".     Prior Function Prior Level of Function : Independent/Modified Independent             Mobility Comments: Pt reports mod I with rollator but multiple falls at home. ADLs Comments: Pt reports ind with self care tasks and her and husband split IADLs. Husband provides transportation.     Hand Dominance   Dominant Hand: Right    Extremity/Trunk Assessment   Upper Extremity Assessment Upper Extremity Assessment: Generalized weakness    Lower Extremity Assessment Lower Extremity Assessment: Generalized weakness       Communication   Communication: No difficulties  Cognition Arousal/Alertness: Awake/alert Behavior During Therapy: WFL for tasks assessed/performed Overall Cognitive Status: Within Functional Limits for tasks assessed                                          General Comments      Exercises     Assessment/Plan    PT Assessment Patient needs continued PT services  PT Problem List Decreased strength;Decreased activity tolerance;Decreased balance;Decreased mobility       PT Treatment Interventions Gait training;DME instruction;Therapeutic exercise;Balance training    PT Goals (Current goals can be found in the Care Plan section)  Acute Rehab PT Goals Patient Stated Goal: to go home PT Goal Formulation: With patient Time For Goal Achievement: 10/28/21 Potential to Achieve Goals: Good    Frequency Min 2X/week     Co-evaluation               AM-PAC PT "6 Clicks" Mobility  Outcome Measure Help needed turning from your back to your side while in a flat bed without using bedrails?: None Help needed moving from lying on your back to sitting on the side of a flat bed without using bedrails?: A Little Help needed moving to and from a bed to a chair (including a wheelchair)?: A Little Help needed standing up from a chair using your arms (e.g., wheelchair or bedside chair)?: A Little Help needed to walk in hospital room?: A  Little Help needed climbing 3-5 steps with a railing? : A Little 6 Click Score: 19    End of Session Equipment Utilized During Treatment: Gait belt Activity Tolerance: Patient tolerated treatment well Patient left: in chair;with chair alarm set Nurse Communication: Mobility status PT Visit Diagnosis: Unsteadiness on feet (R26.81);Muscle weakness (generalized) (M62.81);Difficulty in walking, not elsewhere classified (R26.2)    Time: 1431-1450 PT Time Calculation (min) (ACUTE ONLY): 19 min   Charges:   PT Evaluation $PT Eval Low Complexity: 1 Low          Greggory Stallion, PT, DPT, GCS [phone]   Khary Schaben 10/14/2021, 4:13 PM

## 2021-10-15 LAB — COMPREHENSIVE METABOLIC PANEL
ALT: 26 U/L (ref 0–44)
AST: 40 U/L (ref 15–41)
Albumin: 3.1 g/dL — ABNORMAL LOW (ref 3.5–5.0)
Alkaline Phosphatase: 52 U/L (ref 38–126)
Anion gap: 2 — ABNORMAL LOW (ref 5–15)
BUN: 15 mg/dL (ref 8–23)
CO2: 25 mmol/L (ref 22–32)
Calcium: 8.1 mg/dL — ABNORMAL LOW (ref 8.9–10.3)
Chloride: 114 mmol/L — ABNORMAL HIGH (ref 98–111)
Creatinine, Ser: 0.72 mg/dL (ref 0.44–1.00)
GFR, Estimated: >60 mL/min (ref >60)
Glucose, Bld: 90 mg/dL (ref 70–99)
Potassium: 4.1 mmol/L (ref 3.5–5.1)
Sodium: 141 mmol/L (ref 135–145)
Total Bilirubin: 0.3 mg/dL (ref 0.3–1.2)
Total Protein: 5.6 g/dL — ABNORMAL LOW (ref 6.5–8.1)

## 2021-10-15 LAB — MAGNESIUM: Magnesium: 1.9 mg/dL (ref 1.7–2.4)

## 2021-10-15 LAB — CBC WITH DIFFERENTIAL/PLATELET
Abs Immature Granulocytes: 0.01 10*3/uL (ref 0.00–0.07)
Basophils Absolute: 0 10*3/uL (ref 0.0–0.1)
Basophils Relative: 0 %
Eosinophils Absolute: 0 10*3/uL (ref 0.0–0.5)
Eosinophils Relative: 0 %
HCT: 32.2 % — ABNORMAL LOW (ref 36.0–46.0)
Hemoglobin: 10.5 g/dL — ABNORMAL LOW (ref 12.0–15.0)
Immature Granulocytes: 0 %
Lymphocytes Relative: 33 %
Lymphs Abs: 0.9 10*3/uL (ref 0.7–4.0)
MCH: 29 pg (ref 26.0–34.0)
MCHC: 32.6 g/dL (ref 30.0–36.0)
MCV: 89 fL (ref 80.0–100.0)
Monocytes Absolute: 0.4 10*3/uL (ref 0.1–1.0)
Monocytes Relative: 13 %
Neutro Abs: 1.5 10*3/uL — ABNORMAL LOW (ref 1.7–7.7)
Neutrophils Relative %: 54 %
Platelets: 157 10*3/uL (ref 150–400)
RBC: 3.62 MIL/uL — ABNORMAL LOW (ref 3.87–5.11)
RDW: 13.9 % (ref 11.5–15.5)
WBC: 2.8 10*3/uL — ABNORMAL LOW (ref 4.0–10.5)
nRBC: 0 % (ref 0.0–0.2)

## 2021-10-15 MED ORDER — GUAIFENESIN-DM 100-10 MG/5ML PO SYRP: 10.0000 mL | ORAL_SOLUTION | ORAL | 0 refills | Status: DC | PRN

## 2021-10-15 MED ORDER — BENZONATATE 100 MG PO CAPS: 100.0000 mg | ORAL_CAPSULE | Freq: Three times a day (TID) | ORAL | 0 refills | Status: DC | PRN

## 2021-10-15 MED ORDER — ALBUTEROL SULFATE HFA 108 (90 BASE) MCG/ACT IN AERS: 2.0000 | INHALATION_SPRAY | Freq: Four times a day (QID) | RESPIRATORY_TRACT | 0 refills | Status: AC | PRN

## 2021-10-15 NOTE — Discharge Summary (Signed)
Physician Discharge Summary  [name] VEL:[id number] DOB: 07-21-1933 DOA: 10/13/2021  PCP: Idelle Crouch, MD  Admit date: 10/13/2021 Discharge date: 10/15/2021  Admitted From: Home Disposition: Home  Recommendations for Outpatient Follow-up:  Follow up with PCP in 1-2 weeks   Home Health: No Equipment/Devices: None  Discharge Condition: Stable CODE STATUS: Full Diet recommendation: Regular  Brief/Interim Summary: [name] is a 88 y.o. female with hypertension, hypothyroid, hyperlipidemia, CAD with severe LAD and RCA disease status post PCI with DES in the right coronary artery in 05/31/2014, and rotoblade on 06/01/14 to LAD, history of seizure, idiopathic gout, non-insulin-dependent diabetes mellitus, who presents emergency department for chief concerns of generalized weakness, loss of appetite, nausea and vomiting.  She and her husband tested positive for COVID-19 infection on 10/11/2021.  She and her husband came to the hospital because they could not take care of themselves at home.   She was admitted to the hospital for generalized weakness, intractable nausea and vomiting secondary to COVID-19 infection.  She was treated with molnupiravir and IV fluids.  She also had hypokalemia and hypomagnesemia that were repleted.  Patient symptomatically improved at time of discharge.  No clinical indication of worsening COVID infection.  Appropriate for discharge home at this time.  Offered home health services but patient declined.  We will continue course of Molnupiravir at time of discharge.  Patient was offered home health services but declined due to prior bad experiences    Discharge Diagnoses:  Principal Problem:   Weakness Active Problems:   Thyroid cancer ([place])   Hyperlipidemia   Essential hypertension   Diabetes mellitus without complication (HCC)   Chronic anxiety   Idiopathic chronic gout of multiple sites without tophus   Intractable nausea and  vomiting   Seizure ([place])   Hypothyroidism   CAD (coronary artery disease)   COVID-19 virus infection   Hypokalemia   Hypomagnesemia COVID-19 infection:Continue molnupiravir.  5-day course.  End date 10/5   Hypokalemia and hypomagnesemia: Monitor and replace as necessary   Generalized weakness: PT evaluation.  Offered home health the patient declined   CAD s/p coronary artery stent, history of stroke: Continue Plavix   Seizure disorder: Continue Keppra   Discharge Instructions  Discharge Instructions     Diet - low sodium heart healthy   Complete by: As directed    Increase activity slowly   Complete by: As directed    No wound care   Complete by: As directed       Allergies as of 10/15/2021       Reactions   Ciprofloxacin Diarrhea, Nausea And Vomiting   Codeine Diarrhea, Nausea And Vomiting, Other (See Comments)   Metronidazole Diarrhea, Nausea And Vomiting, Nausea Only   Other Hives, Other (See Comments)   Distress Reaction:  Muscle pain Reaction:  Muscle pain Reaction:  Muscle pain   Petrolatum-zinc Oxide Hives   Sulfa Antibiotics Other (See Comments), Hives   Distress Distress Distress Distress Other reaction(s): Not available   Neomycin-bacitracin Zn-polymyx Other (See Comments)   Reaction:  Unknown  Reaction:  Unknown  Reaction:  Unknown  Reaction:  Unknown   Bacitracin-polymyxin B Rash   Celecoxib Itching, Rash, Other (See Comments)   Neomycin Itching   Statins Other (See Comments)   Reaction:  Muscle pain Reaction:  Muscle pain Reaction:  Muscle pain   Vioxx [rofecoxib] Itching, Rash        Medication List     TAKE these medications  acetaminophen 500 MG tablet Commonly known as: TYLENOL Take 500-1,000 mg by mouth every 6 (six) hours as needed for mild pain or moderate pain.   albuterol 108 (90 Base) MCG/ACT inhaler Commonly known as: VENTOLIN HFA Inhale 2 puffs into the lungs every 6 (six) hours as needed for wheezing or shortness of  breath.   allopurinol 100 MG tablet Commonly known as: ZYLOPRIM TAKE 1 TABLET(100 MG) BY MOUTH TWICE DAILY   benzonatate 100 MG capsule Commonly known as: Tessalon Perles Take 1 capsule (100 mg total) by mouth 3 (three) times daily as needed for cough.   clopidogrel 75 MG tablet Commonly known as: PLAVIX Take 1 tablet (75 mg total) by mouth daily.   Colchicine 0.6 MG Caps Commonly known as: Mitigare Take 0.6 mg by mouth daily as needed.   furosemide 20 MG tablet Commonly known as: LASIX Take 1 tablet (20 mg total) by mouth daily as needed for fluid. (Take with POTASSIUM)   guaiFENesin-dextromethorphan 100-10 MG/5ML syrup Commonly known as: ROBITUSSIN DM Take 10 mLs by mouth every 4 (four) hours as needed for cough.   ICAPS AREDS 2 PO Take 1 tablet by mouth 2 (two) times daily.   isosorbide mononitrate 30 MG 24 hr tablet Commonly known as: IMDUR Take 1 tablet (30 mg total) by mouth daily.   levETIRAcetam 250 MG tablet Commonly known as: KEPPRA Take 1 tablet (250 mg total) by mouth 2 (two) times daily.   levothyroxine 75 MCG tablet Commonly known as: SYNTHROID Take 75 mcg by mouth daily.   levothyroxine 100 MCG tablet Commonly known as: SYNTHROID Take 100 mcg by mouth daily.   metoprolol succinate 25 MG 24 hr tablet Commonly known as: TOPROL-XL Take 0.5 tablets (12.5 mg total) by mouth daily.   molnupiravir EUA 200 MG Caps capsule Commonly known as: LAGEVRIO Take 4 capsules (800 mg total) by mouth 2 (two) times daily for 5 days.   nitroGLYCERIN 0.4 MG SL tablet Commonly known as: NITROSTAT Place 1 tablet (0.4 mg total) under the tongue every 5 (five) minutes as needed for chest pain.   ondansetron 4 MG disintegrating tablet Commonly known as: ZOFRAN-ODT Take 1 tablet (4 mg total) by mouth every 8 (eight) hours as needed for nausea or vomiting.   potassium chloride 10 MEQ tablet Commonly known as: KLOR-CON Take 1 tablet (10 mEq total) by mouth daily as  needed (nutrient replenishment). (Take with FUROSEMIDE)   rosuvastatin 10 MG tablet Commonly known as: CRESTOR Take 10 mg by mouth daily.   rosuvastatin 5 MG tablet Commonly known as: CRESTOR TAKE 1 TABLET(5 MG) BY MOUTH DAILY   traZODone 50 MG tablet Commonly known as: DESYREL Take 0.5 tablets (25 mg total) by mouth at bedtime as needed for sleep.        Allergies  Allergen Reactions   Ciprofloxacin Diarrhea and Nausea And Vomiting   Codeine Diarrhea, Nausea And Vomiting and Other (See Comments)   Metronidazole Diarrhea, Nausea And Vomiting and Nausea Only   Other Hives and Other (See Comments)    Distress Reaction:  Muscle pain Reaction:  Muscle pain Reaction:  Muscle pain   Petrolatum-Zinc Oxide Hives   Sulfa Antibiotics Other (See Comments) and Hives    Distress Distress Distress Distress Other reaction(s): Not available   Neomycin-Bacitracin Zn-Polymyx Other (See Comments)    Reaction:  Unknown  Reaction:  Unknown  Reaction:  Unknown  Reaction:  Unknown   Bacitracin-Polymyxin B Rash   Celecoxib Itching, Rash and Other (See Comments)  Neomycin Itching   Statins Other (See Comments)    Reaction:  Muscle pain Reaction:  Muscle pain Reaction:  Muscle pain   Vioxx [Rofecoxib] Itching and Rash    Consultations: None   Procedures/Studies: DG Chest 2 View  Result Date: 10/13/2021 CLINICAL DATA:  Weakness.  Tested positive for COVID. EXAM: CHEST - 2 VIEW COMPARISON:  08/25/2020. FINDINGS: Cardiac silhouette is normal in size and configuration. No mediastinal or hilar masses. No evidence of adenopathy. Lungs are hyperexpanded. Prominent interstitial markings at the bases. Lungs otherwise clear. No pleural effusion or pneumothorax. Skeletal structures are grossly intact. IMPRESSION: No active cardiopulmonary disease. Electronically Signed   By: Lajean Manes M.D.   On: 10/13/2021 15:59      Subjective: And examined on day of discharge.  Stable no distress.  Mild  cough but otherwise asymptomatic.  Stable for discharge home.  Discharge Exam: Vitals:   10/15/21 0507 10/15/21 0845  BP: (!) 162/68 (!) 161/62  Pulse: 72 78  Resp: 16 16  Temp: 99.2 F (37.3 C) 98.4 F (36.9 C)  SpO2: 96% 98%   Vitals:   10/14/21 1601 10/14/21 2051 10/15/21 0507 10/15/21 0845  BP: (!) 141/51 (!) 159/62 (!) 162/68 (!) 161/62  Pulse: 67 72 72 78  Resp: '17 16 16 16  '$ Temp: 98.2 F (36.8 C) 98.8 F (37.1 C) 99.2 F (37.3 C) 98.4 F (36.9 C)  TempSrc:      SpO2: 100% 99% 96% 98%  Weight:      Height:        General: Pt is alert, awake, not in acute distress Cardiovascular: RRR, S1/S2 +, no rubs, no gallops Respiratory: CTA bilaterally, no wheezing, no rhonchi Abdominal: Soft, NT, ND, bowel sounds + Extremities: no edema, no cyanosis    The results of significant diagnostics from this hospitalization (including imaging, microbiology, ancillary and laboratory) are listed below for reference.     Microbiology: Recent Results (from the past 240 hour(s))  Resp Panel by RT-PCR (Flu A&B, Covid) Anterior Nasal Swab     Status: Abnormal   Collection Time: 10/11/21  3:58 PM   Specimen: Anterior Nasal Swab  Result Value Ref Range Status   SARS Coronavirus 2 by RT PCR POSITIVE (A) NEGATIVE Final    Comment: (NOTE) SARS-CoV-2 target nucleic acids are DETECTED.  The SARS-CoV-2 RNA is generally detectable in upper respiratory specimens during the acute phase of infection. Positive results are indicative of the presence of the identified virus, but do not rule out bacterial infection or co-infection with other pathogens not detected by the test. Clinical correlation with patient history and other diagnostic information is necessary to determine patient infection status. The expected result is Negative.  Fact Sheet for Patients: EntrepreneurPulse.com.au  Fact Sheet for Healthcare Providers: IncredibleEmployment.be  This  test is not yet approved or cleared by the [place] FDA and  has been authorized for detection and/or diagnosis of SARS-CoV-2 by FDA under an Emergency Use Authorization (EUA).  This EUA will remain in effect (meaning this test can be used) for the duration of  the COVID-19 declaration under Section 564(b)(1) of the A ct, 21 U.S.C. section 360bbb-3(b)(1), unless the authorization is terminated or revoked sooner.     Influenza A by PCR NEGATIVE NEGATIVE Final   Influenza B by PCR NEGATIVE NEGATIVE Final    Comment: (NOTE) The Xpert Xpress SARS-CoV-2/FLU/RSV plus assay is intended as an aid in the diagnosis of influenza from Nasopharyngeal swab specimens and should not be  used as a sole basis for treatment. Nasal washings and aspirates are unacceptable for Xpert Xpress SARS-CoV-2/FLU/RSV testing.  Fact Sheet for Patients: EntrepreneurPulse.com.au  Fact Sheet for Healthcare Providers: IncredibleEmployment.be  This test is not yet approved or cleared by the [place] FDA and has been authorized for detection and/or diagnosis of SARS-CoV-2 by FDA under an Emergency Use Authorization (EUA). This EUA will remain in effect (meaning this test can be used) for the duration of the COVID-19 declaration under Section 564(b)(1) of the Act, 21 U.S.C. section 360bbb-3(b)(1), unless the authorization is terminated or revoked.  Performed at [facility], [address] [place], [place] [address]      Labs: BNP (last 3 results) No results for input(s): "BNP" in the last 8760 hours. Basic Metabolic Panel: Recent Labs  Lab 10/11/21 1558 10/13/21 1424 10/13/21 1928 10/14/21 0433 10/15/21 0358  NA 140 142  --  141 141  K 4.5 3.8  --  2.9* 4.1  CL 109 108  --  109 114*  CO2 22 23  --  25 25  GLUCOSE 143* 114*  --  74 90  BUN 14 24*  --  21 15  CREATININE 0.68 0.98  --  0.70 0.72  CALCIUM 9.1 9.1  --  8.1* 8.1*  MG  --   --  1.8  1.6* 1.9  PHOS  --   --   --  2.9  --    Liver Function Tests: Recent Labs  Lab 10/11/21 1558 10/13/21 1732 10/14/21 0433 10/15/21 0358  AST 31 47* 51* 40  ALT '15 23 28 26  '$ ALKPHOS 71 61 55 52  BILITOT 0.7 0.6 0.4 0.3  PROT 7.0 6.9 5.9* 5.6*  ALBUMIN 4.0 3.9 3.2* 3.1*   Recent Labs  Lab 10/11/21 1558  LIPASE 45   No results for input(s): "AMMONIA" in the last 168 hours. CBC: Recent Labs  Lab 10/11/21 1558 10/13/21 1424 10/14/21 0433 10/15/21 0358  WBC 4.0 4.7 3.8* 2.8*  NEUTROABS  --   --  2.3 1.5*  HGB 11.3* 13.2 10.8* 10.5*  HCT 35.3* 42.6 34.2* 32.2*  MCV 89.6 91.2 88.8 89.0  PLT 203 155 175 157   Cardiac Enzymes: Recent Labs  Lab 10/11/21 1558  CKTOTAL 52   BNP: Invalid input(s): "POCBNP" CBG: Recent Labs  Lab 10/13/21 2152  GLUCAP 142*   D-Dimer No results for input(s): "DDIMER" in the last 72 hours. Hgb A1c No results for input(s): "HGBA1C" in the last 72 hours. Lipid Profile No results for input(s): "CHOL", "HDL", "LDLCALC", "TRIG", "CHOLHDL", "LDLDIRECT" in the last 72 hours. Thyroid function studies No results for input(s): "TSH", "T4TOTAL", "T3FREE", "THYROIDAB" in the last 72 hours.  Invalid input(s): "FREET3" Anemia work up Recent Labs    10/13/21 2211  VITAMINB12 492   Urinalysis    Component Value Date/Time   COLORURINE STRAW (A) 10/13/2021 1732   APPEARANCEUR CLEAR (A) 10/13/2021 1732   APPEARANCEUR Hazy 10/31/2013 1407   LABSPEC 1.008 10/13/2021 1732   LABSPEC 1.011 10/31/2013 1407   PHURINE 5.0 10/13/2021 1732   GLUCOSEU NEGATIVE 10/13/2021 1732   GLUCOSEU Negative 10/31/2013 1407   HGBUR NEGATIVE 10/13/2021 1732   BILIRUBINUR NEGATIVE 10/13/2021 1732   BILIRUBINUR Negative 10/31/2013 1407   KETONESUR NEGATIVE 10/13/2021 1732   PROTEINUR NEGATIVE 10/13/2021 1732   UROBILINOGEN 0.2 09/20/2009 1206   NITRITE NEGATIVE 10/13/2021 1732   LEUKOCYTESUR NEGATIVE 10/13/2021 1732   LEUKOCYTESUR Trace 10/31/2013 1407    Sepsis Labs Recent  Labs  Lab 10/11/21 1558 10/13/21 1424 10/14/21 0433 10/15/21 0358  WBC 4.0 4.7 3.8* 2.8*   Microbiology Recent Results (from the past 240 hour(s))  Resp Panel by RT-PCR (Flu A&B, Covid) Anterior Nasal Swab     Status: Abnormal   Collection Time: 10/11/21  3:58 PM   Specimen: Anterior Nasal Swab  Result Value Ref Range Status   SARS Coronavirus 2 by RT PCR POSITIVE (A) NEGATIVE Final    Comment: (NOTE) SARS-CoV-2 target nucleic acids are DETECTED.  The SARS-CoV-2 RNA is generally detectable in upper respiratory specimens during the acute phase of infection. Positive results are indicative of the presence of the identified virus, but do not rule out bacterial infection or co-infection with other pathogens not detected by the test. Clinical correlation with patient history and other diagnostic information is necessary to determine patient infection status. The expected result is Negative.  Fact Sheet for Patients: EntrepreneurPulse.com.au  Fact Sheet for Healthcare Providers: IncredibleEmployment.be  This test is not yet approved or cleared by the [place] FDA and  has been authorized for detection and/or diagnosis of SARS-CoV-2 by FDA under an Emergency Use Authorization (EUA).  This EUA will remain in effect (meaning this test can be used) for the duration of  the COVID-19 declaration under Section 564(b)(1) of the A ct, 21 U.S.C. section 360bbb-3(b)(1), unless the authorization is terminated or revoked sooner.     Influenza A by PCR NEGATIVE NEGATIVE Final   Influenza B by PCR NEGATIVE NEGATIVE Final    Comment: (NOTE) The Xpert Xpress SARS-CoV-2/FLU/RSV plus assay is intended as an aid in the diagnosis of influenza from Nasopharyngeal swab specimens and should not be used as a sole basis for treatment. Nasal washings and aspirates are unacceptable for Xpert Xpress SARS-CoV-2/FLU/RSV testing.  Fact Sheet  for Patients: EntrepreneurPulse.com.au  Fact Sheet for Healthcare Providers: IncredibleEmployment.be  This test is not yet approved or cleared by the [place] FDA and has been authorized for detection and/or diagnosis of SARS-CoV-2 by FDA under an Emergency Use Authorization (EUA). This EUA will remain in effect (meaning this test can be used) for the duration of the COVID-19 declaration under Section 564(b)(1) of the Act, 21 U.S.C. section 360bbb-3(b)(1), unless the authorization is terminated or revoked.  Performed at [facility], [address] [place], [place] [address]      Time coordinating discharge: Over 30 minutes  SIGNED:   Sidney Ace, MD  [facility] 10/15/2021, 1:25 PM Pager   If 7PM-7AM, please contact night-coverage

## 2021-10-15 NOTE — TOC Transition Note (Signed)
Transition of Care [facility]) - CM/SW Discharge Note   Patient Details  Name: [name] MRN: [id number] Date of Birth: 27-Jul-1933  Transition of Care [facility]) CM/SW Contact:  Laurena Slimmer, RN Phone Number: 10/15/2021, 2:56 PM   Clinical Narrative:    EMS arranged. Face sheet and medical necessity forms printed to floor. TOC signing off.          Patient Goals and CMS Choice        Discharge Placement                       Discharge Plan and Services                                     Social Determinants of Health (SDOH) Interventions     Readmission Risk Interventions    10/15/2021   11:35 AM  Readmission Risk Prevention Plan  Transportation Screening Complete  PCP or Specialist Appt within 3-5 Days Complete  Palliative Care Screening Not Applicable  Medication Review (RN Care Manager) Complete

## 2021-10-15 NOTE — Progress Notes (Signed)
Received MD order to discharge patient to home, reviewed home meds, discharge instructions, prescriptions and follow up appointments with patient and patient verbalized understanding    

## 2021-10-15 NOTE — TOC Initial Note (Addendum)
Transition of Care [facility]) - Initial/Assessment Note    Patient Details  Name: [name] MRN: [id number] Date of Birth: Dec 11, 1933  Transition of Care [facility]) CM/SW Contact:    Laurena Slimmer, RN Phone Number: 10/15/2021, 11:36 AM  Clinical Narrative:                 10:00 Spoke with Patient's Daughter, Amy.  Patient and her spouse live in an apartment. She has a  primary care provider and are able to obtain medications. Patient daughter assist with patients care. Patient daughter stated she would not be able to come transport patient back home because of her COVID status.   11:00 am  Spoke with patient by phone regarding discharge. Patient is refusing HH recommendation for PT/OT due to past experiences. She stated her husband is not interested in pursing any type outside agency care at this time. Patient is requesting EMS transport.         Patient Goals and CMS Choice        Expected Discharge Plan and Services           Expected Discharge Date: 10/15/21                                    Prior Living Arrangements/Services                       Activities of Daily Living Home Assistive Devices/Equipment: Gilford Rile (specify type) ADL Screening (condition at time of admission) Patient's cognitive ability adequate to safely complete daily activities?: Yes Is the patient deaf or have difficulty hearing?: No Does the patient have difficulty seeing, even when wearing glasses/contacts?: Yes Does the patient have difficulty concentrating, remembering, or making decisions?: No Patient able to express need for assistance with ADLs?: Yes Does the patient have difficulty dressing or bathing?: No Independently performs ADLs?: Yes (appropriate for developmental age) Does the patient have difficulty walking or climbing stairs?: Yes Weakness of Legs: Both Weakness of Arms/Hands: None  Permission Sought/Granted                  Emotional Assessment               Admission diagnosis:  Weakness [R53.1] Generalized weakness [R53.1] COVID-19 [U07.1] COVID-19 virus infection [U07.1] Patient Active Problem List   Diagnosis Date Noted   Hypokalemia 10/14/2021   Hypomagnesemia 10/14/2021   Weakness 10/13/2021   COVID-19 virus infection 10/13/2021   Sundowning 02/14/2021   Dens fracture ([place]) 02/12/2021   Unsteady gait 02/12/2021   Hypothyroidism 02/11/2021   CAD (coronary artery disease) 02/11/2021   Multiple skin tears 02/11/2021   Chronic diastolic CHF (congestive heart failure) ([place]) 02/11/2021   Seizure ([place]) 08/25/2020   Diverticulitis 07/24/2020   Intractable nausea and vomiting 07/23/2020   Generalized weakness 07/23/2020   Achilles tendon contracture, bilateral 06/29/2017   Acute bilateral low back pain without sciatica 03/17/2016   Idiopathic chronic gout of multiple sites without tophus 03/17/2016   Idiopathic gout of multiple sites 11/27/2015   Cellulitis 05/15/2015   Bilateral leg edema 02/08/2015   Chronic anxiety 11/22/2014   Cerebrovascular accident (CVA) ([place]) 07/25/2014   Arterial vascular disease 07/25/2014   Swelling of right lower extremity 06/22/2014   Diabetes mellitus without complication ([place])    Stroke ([place]) 06/08/2014   Anemia 06/07/2014   Unstable angina ([place]) 06/06/2014   Carotid artery  disease [facility])    Atherosclerosis of native coronary artery with stable angina pectoris ([place])    Hyperlipidemia    Essential hypertension    Angina pectoris ([place]) 05/31/2014   SOB (shortness of breath) 04/11/2014   Palpitations 04/11/2014   Other fatigue 04/11/2014   Thyroid cancer ([place]) 04/11/2014   PCP:  Idelle Crouch, MD Pharmacy:   [id number] [address], [address] [place] [address] Phone: [phone] Fax: [phone]  [facility] - [address], [place] [address] Phone: [phone] Fax: [phone]  [facility] [place], [place] [address] [place] [address] Phone: [phone] Fax: [phone]  [id number] - [address], [place] - [place] [address] Phone: [phone] Fax: [phone]  [facility] ([place], [facility] [address] [address] Phone: [phone] Fax: [place], [place] [address] Phone: [phone] Fax: [phone]     Social Determinants of Health (SDOH) Interventions    Readmission Risk Interventions    10/15/2021   11:35 AM  Readmission Risk Prevention Plan  Transportation Screening Complete  PCP or Specialist Appt within 3-5 Days Complete  Palliative Care Screening Not Applicable  Medication Review (RN Care Manager) Complete

## 2021-10-19 ENCOUNTER — Other Ambulatory Visit: Payer: Self-pay

## 2021-10-19 DIAGNOSIS — T50901A Poisoning by unspecified drugs, medicaments and biological substances, accidental (unintentional), initial encounter: Secondary | ICD-10-CM | POA: Insufficient documentation

## 2021-10-19 DIAGNOSIS — Z8616 Personal history of COVID-19: Secondary | ICD-10-CM | POA: Insufficient documentation

## 2021-10-19 LAB — CBC WITH DIFFERENTIAL/PLATELET
Abs Immature Granulocytes: 0.05 10*3/uL (ref 0.00–0.07)
Basophils Absolute: 0 10*3/uL (ref 0.0–0.1)
Basophils Relative: 0 %
Eosinophils Absolute: 0.1 10*3/uL (ref 0.0–0.5)
Eosinophils Relative: 3 %
HCT: 36.9 % (ref 36.0–46.0)
Hemoglobin: 11.8 g/dL — ABNORMAL LOW (ref 12.0–15.0)
Immature Granulocytes: 1 %
Lymphocytes Relative: 25 %
Lymphs Abs: 1.2 10*3/uL (ref 0.7–4.0)
MCH: 29 pg (ref 26.0–34.0)
MCHC: 32 g/dL (ref 30.0–36.0)
MCV: 90.7 fL (ref 80.0–100.0)
Monocytes Absolute: 0.4 10*3/uL (ref 0.1–1.0)
Monocytes Relative: 9 %
Neutro Abs: 3 10*3/uL (ref 1.7–7.7)
Neutrophils Relative %: 62 %
Platelets: 150 10*3/uL (ref 150–400)
RBC: 4.07 MIL/uL (ref 3.87–5.11)
RDW: 13.6 % (ref 11.5–15.5)
WBC: 4.7 10*3/uL (ref 4.0–10.5)
nRBC: 0 % (ref 0.0–0.2)

## 2021-10-19 LAB — COMPREHENSIVE METABOLIC PANEL
ALT: 29 U/L (ref 0–44)
AST: 38 U/L (ref 15–41)
Albumin: 3.9 g/dL (ref 3.5–5.0)
Alkaline Phosphatase: 61 U/L (ref 38–126)
Anion gap: 11 (ref 5–15)
BUN: 17 mg/dL (ref 8–23)
CO2: 21 mmol/L — ABNORMAL LOW (ref 22–32)
Calcium: 8.6 mg/dL — ABNORMAL LOW (ref 8.9–10.3)
Chloride: 110 mmol/L (ref 98–111)
Creatinine, Ser: 0.88 mg/dL (ref 0.44–1.00)
GFR, Estimated: >60 mL/min (ref >60)
Glucose, Bld: 124 mg/dL — ABNORMAL HIGH (ref 70–99)
Potassium: 3.7 mmol/L (ref 3.5–5.1)
Sodium: 142 mmol/L (ref 135–145)
Total Bilirubin: 0.5 mg/dL (ref 0.3–1.2)
Total Protein: 6.7 g/dL (ref 6.5–8.1)

## 2021-10-19 NOTE — ED Notes (Signed)
Husband up to desk

## 2021-10-19 NOTE — ED Triage Notes (Signed)
Pt arrives with c/o taking 4 pills of the wrong medication. Pt was suppsoe to be takign her 4 COVID medications and instead took 4 antibiotic pills. Poison control suggested she come to the ED. Pt denies complaints at this time.

## 2021-10-19 NOTE — ED Triage Notes (Signed)
FIRST NURSE NOTE:  Received phone call from [place], reports pt reported mistakenly taken 4 Valacyclovir tabs instead of the meds for COVID. Per Poison Control pt needs CBC and CMP and monitor for 6 hours, common problem is GI upset, but due to age, kidney function could be affected.  Ingestion time was around 1700

## 2021-10-20 ENCOUNTER — Emergency Department
Admission: EM | Admit: 2021-10-20 | Discharge: 2021-10-20 | Disposition: A | Discharge status: Home / Self Care | Payer: Medicare Other | Attending: Emergency Medicine | Admitting: Emergency Medicine

## 2021-10-20 DIAGNOSIS — T50901A Poisoning by unspecified drugs, medicaments and biological substances, accidental (unintentional), initial encounter: Secondary | ICD-10-CM

## 2021-10-20 LAB — BASIC METABOLIC PANEL
Anion gap: 7 (ref 5–15)
BUN: 17 mg/dL (ref 8–23)
CO2: 24 mmol/L (ref 22–32)
Calcium: 9.1 mg/dL (ref 8.9–10.3)
Chloride: 112 mmol/L — ABNORMAL HIGH (ref 98–111)
Creatinine, Ser: 0.83 mg/dL (ref 0.44–1.00)
GFR, Estimated: >60 mL/min (ref >60)
Glucose, Bld: 114 mg/dL — ABNORMAL HIGH (ref 70–99)
Potassium: 4 mmol/L (ref 3.5–5.1)
Sodium: 143 mmol/L (ref 135–145)

## 2021-10-20 NOTE — ED Provider Notes (Signed)
[facility] Provider Note    Event Date/Time   First MD Initiated Contact with Patient 10/20/21 0231     (approximate)   History   Ingestion   HPI  [name] is a 88 y.o. female recently diagnosed with COVID-19 who presents for accidental overdose.  She thought she was taking her last dose of paxlovid, but she accidentally took 4 pills of valacyclovir that she had left over from last year.  When she realized that she had done she called poison control.  They recommended she come to the emergency department.  Poison control recommended 6 hours of observation given concerns for possible acute kidney injury and GI upset.  The patient reports that she feels fine.  She is having no difficulty breathing, nausea, vomiting, abdominal pain, diarrhea.     Physical Exam   Triage Vital Signs: ED Triage Vitals  Enc Vitals Group     BP 10/19/21 2030 (!) 143/58     Pulse Rate 10/19/21 2030 87     Resp 10/19/21 2030 19     Temp 10/19/21 2030 98.7 F (37.1 C)     Temp Source 10/19/21 2030 Oral     SpO2 10/19/21 2030 96 %     Weight 10/19/21 2027 57.6 kg (127 lb)     Height --      Head Circumference --      Peak Flow --      Pain Score 10/19/21 2027 0     Pain Loc --      Pain Edu? --      Excl. in [place]? --     Most recent vital signs: Vitals:   10/19/21 2030 10/20/21 0051  BP: (!) 143/58 (!) 163/67  Pulse: 87 80  Resp: 19 18  Temp: 98.7 F (37.1 C) 98.3 F (36.8 C)  SpO2: 96% 98%     General: Awake, no distress.  CV:  Good peripheral perfusion.  Resp:  Normal effort.  Abd:  No distention.  Other:  Alert, oriented, acting appropriate.   ED Results / Procedures / Treatments   Labs (all labs ordered are listed, but only abnormal results are displayed) Labs Reviewed  COMPREHENSIVE METABOLIC PANEL - Abnormal; Notable for the following components:      Result Value   CO2 21 (*)    Glucose, Bld 124 (*)    Calcium 8.6 (*)    All  other components within normal limits  CBC WITH DIFFERENTIAL/PLATELET - Abnormal; Notable for the following components:   Hemoglobin 11.8 (*)    All other components within normal limits  BASIC METABOLIC PANEL - Abnormal; Notable for the following components:   Chloride 112 (*)    Glucose, Bld 114 (*)    All other components within normal limits     PROCEDURES:  Critical Care performed: No  Procedures   MEDICATIONS ORDERED IN ED: Medications - No data to display   IMPRESSION / MDM / [place] / ED COURSE  I reviewed the triage vital signs and the nursing notes.                              Differential diagnosis includes, but is not limited to, accidental overdose, intentional overdose, acute renal failure or acute kidney injury, electrolyte or metabolic abnormality, GI upset including nausea, vomiting, diarrhea, or abdominal pain.  Patient's presentation is most consistent with acute presentation with potential  threat to life or bodily function.  Labs/studies ordered: CBC with differential, comprehensive metabolic panel, and then repeat basic metabolic panel.  Vital signs are stable and within normal limits other than hypertension.  Patient is asymptomatic.  Poison control recommended 6 hours of observation and she had that in the emergency department.  I then ordered a repeat BMP after verifying that her initial labs were reassuring.  Her BMP was unchanged with no increase in creatinine or decrease in GFR.  Given that she has been asymptomatic with no acute abnormalities, I feel that she is appropriate for discharge.  The patient and her husband agree.  I gave my usual and customary follow-up recommendations and return precautions.       FINAL CLINICAL IMPRESSION(S) / ED DIAGNOSES   Final diagnoses:  Accidental overdose, initial encounter     Rx / DC Orders   ED Discharge Orders     None        Note:  This document was prepared using Dragon voice  recognition software and may include unintentional dictation errors.   Hinda Kehr, MD 10/20/21 [phone]

## 2021-10-20 NOTE — Discharge Instructions (Addendum)
You can take your dose of paxlovid as previously described and continue taking your regular medications.  Follow-up with your primary care doctor as needed.

## 2021-12-01 ENCOUNTER — Other Ambulatory Visit: Payer: Self-pay | Admitting: Cardiovascular Disease

## 2022-01-17 ENCOUNTER — Encounter: Payer: Self-pay | Admitting: Emergency Medicine

## 2022-01-17 ENCOUNTER — Emergency Department
Admission: EM | Admit: 2022-01-17 | Discharge: 2022-01-17 | Disposition: A | Discharge status: Home / Self Care | Payer: Medicare Other | Attending: Student in an Organized Health Care Education/Training Program | Admitting: Student in an Organized Health Care Education/Training Program

## 2022-01-17 ENCOUNTER — Emergency Department: Payer: Medicare Other

## 2022-01-17 ENCOUNTER — Other Ambulatory Visit: Payer: Self-pay

## 2022-01-17 DIAGNOSIS — R55 Syncope and collapse: Secondary | ICD-10-CM | POA: Insufficient documentation

## 2022-01-17 DIAGNOSIS — Y9389 Activity, other specified: Secondary | ICD-10-CM | POA: Insufficient documentation

## 2022-01-17 DIAGNOSIS — S0990XA Unspecified injury of head, initial encounter: Secondary | ICD-10-CM | POA: Diagnosis present

## 2022-01-17 DIAGNOSIS — X58XXXA Exposure to other specified factors, initial encounter: Secondary | ICD-10-CM | POA: Insufficient documentation

## 2022-01-17 DIAGNOSIS — Y998 Other external cause status: Secondary | ICD-10-CM | POA: Diagnosis not present

## 2022-01-17 DIAGNOSIS — Y9289 Other specified places as the place of occurrence of the external cause: Secondary | ICD-10-CM | POA: Insufficient documentation

## 2022-01-17 LAB — BASIC METABOLIC PANEL
Anion gap: 9 (ref 5–15)
BUN: 25 mg/dL — ABNORMAL HIGH (ref 8–23)
CO2: 23 mmol/L (ref 22–32)
Calcium: 9.4 mg/dL (ref 8.9–10.3)
Chloride: 109 mmol/L (ref 98–111)
Creatinine, Ser: 0.96 mg/dL (ref 0.44–1.00)
GFR, Estimated: 58 mL/min — ABNORMAL LOW (ref >60)
Glucose, Bld: 134 mg/dL — ABNORMAL HIGH (ref 70–99)
Potassium: 4.2 mmol/L (ref 3.5–5.1)
Sodium: 141 mmol/L (ref 135–145)

## 2022-01-17 LAB — CBC
HCT: 36.9 % (ref 36.0–46.0)
Hemoglobin: 11.7 g/dL — ABNORMAL LOW (ref 12.0–15.0)
MCH: 28.7 pg (ref 26.0–34.0)
MCHC: 31.7 g/dL (ref 30.0–36.0)
MCV: 90.7 fL (ref 80.0–100.0)
Platelets: 205 10*3/uL (ref 150–400)
RBC: 4.07 MIL/uL (ref 3.87–5.11)
RDW: 14.2 % (ref 11.5–15.5)
WBC: 6.9 10*3/uL (ref 4.0–10.5)
nRBC: 0 % (ref 0.0–0.2)

## 2022-01-17 LAB — TROPONIN I (HIGH SENSITIVITY)
Troponin I (High Sensitivity): 8 ng/L (ref <18)
Troponin I (High Sensitivity): 9 ng/L (ref <18)

## 2022-01-17 MED ORDER — ACETAMINOPHEN 325 MG PO TABS
650.0000 mg | ORAL_TABLET | Freq: Once | ORAL | Status: AC
  Administered 2022-01-17: 650 mg via ORAL
  Filled 2022-01-17: qty 2

## 2022-01-17 MED ORDER — SODIUM CHLORIDE 0.9 % IV BOLUS
1000.0000 mL | Freq: Once | INTRAVENOUS | Status: AC
  Administered 2022-01-17: 1000 mL via INTRAVENOUS

## 2022-01-17 NOTE — ED Triage Notes (Addendum)
First Nurse Note:  Pt via EMS from home. Pt had a syncopal episode on the couch. Pt did have a syncopal episode last night and hit her head. Pt is on Plavix. Pt is c/o headache at this time. Pt is A&OX4 and NAD  80 HR  96% on RA  131/57 BP  98 oral temp 180 CBG

## 2022-01-17 NOTE — ED Notes (Signed)
RN unable to get blood work

## 2022-01-17 NOTE — ED Provider Notes (Signed)
[facility] Provider Note    Event Date/Time   First MD Initiated Contact with Patient 01/17/22 1335     (approximate)   History   Loss of Consciousness   HPI  [name] is a 89 y.o. female presents to the ER for 2 episodes of fainting spells.  1 occurred last night.  Did hit the back of her head.  She is not any blood thinners.  Had another episode occurred while she is preparing breakfast this morning.  She did feel lightheaded prior to the event denies any palpitations no chest pain or shortness of breath.  No numbness or tingling.  Denied any headache prior to the episode.  Denies any dysuria.  No melena no hematochezia.  Has had frequent episodes like this previously.  She denies any discomfort at this time.     Physical Exam   Triage Vital Signs: ED Triage Vitals  Enc Vitals Group     BP 01/17/22 1249 97/63     Pulse Rate 01/17/22 1249 86     Resp 01/17/22 1249 18     Temp 01/17/22 1249 98 F (36.7 C)     Temp Source 01/17/22 1249 Oral     SpO2 01/17/22 1249 95 %     Weight 01/17/22 1241 125 lb (56.7 kg)     Height 01/17/22 1241 '5\' 1"'$  (1.549 m)     Head Circumference --      [place] Flow --      Pain Score 01/17/22 1241 8     Pain Loc --      Pain Edu? --      Excl. in [place]? --     Most recent vital signs: Vitals:   01/17/22 1249 01/17/22 1703  BP: 97/63 (!) 153/59  Pulse: 86 85  Resp: 18 18  Temp: 98 F (36.7 C) 98.4 F (36.9 C)  SpO2: 95% 99%     Constitutional: Alert  Eyes: Conjunctivae are normal.  Head: Atraumatic. Nose: No congestion/rhinnorhea. Mouth/Throat: Mucous membranes are moist.   Neck: Painless ROM.  Cardiovascular:   Good peripheral circulation. Respiratory: Normal respiratory effort.  No retractions.  Gastrointestinal: Soft and nontender.  Musculoskeletal:  no deformity Neurologic:  MAE spontaneously. No gross focal neurologic deficits are appreciated.  Skin:  Skin is warm, dry and intact. No rash  noted. Psychiatric: Mood and affect are normal. Speech and behavior are normal.    ED Results / Procedures / Treatments   Labs (all labs ordered are listed, but only abnormal results are displayed) Labs Reviewed  BASIC METABOLIC PANEL - Abnormal; Notable for the following components:      Result Value   Glucose, Bld 134 (*)    BUN 25 (*)    GFR, Estimated 58 (*)    All other components within normal limits  CBC - Abnormal; Notable for the following components:   Hemoglobin 11.7 (*)    All other components within normal limits  URINALYSIS, ROUTINE W REFLEX MICROSCOPIC  CBG MONITORING, ED  TROPONIN I (HIGH SENSITIVITY)  TROPONIN I (HIGH SENSITIVITY)     EKG  ED ECG REPORT I, Merlyn Lot, the attending physician, personally viewed and interpreted this ECG.   Date: 01/17/2022  EKG Time: 12:58  Rate: 85  Rhythm: sinus  Axis: normal  Intervals: normal  ST&T Change: no stemi, no depressions    RADIOLOGY Please see ED Course for my review and interpretation.  I personally reviewed all radiographic images ordered  to evaluate for the above acute complaints and reviewed radiology reports and findings.  These findings were personally discussed with the patient.  Please see medical record for radiology report.    PROCEDURES:  Critical Care performed:   Procedures   MEDICATIONS ORDERED IN ED: Medications  sodium chloride 0.9 % bolus 1,000 mL (1,000 mLs Intravenous New Bag/Given 01/17/22 1551)  acetaminophen (TYLENOL) tablet 650 mg (650 mg Oral Given 01/17/22 1715)     IMPRESSION / MDM / ASSESSMENT AND PLAN / ED COURSE  I reviewed the triage vital signs and the nursing notes.                              Differential diagnosis includes, but is not limited to, dehydration, dysrhythmia, ACS, SDH, IPH, TIA, seizure, electrolyte abnormality, orthostasis, medication effect  Patient presenting to the ER for evaluation of symptoms as described above.  Based on  symptoms, risk factors and considered above differential, this presenting complaint could reflect a potentially life-threatening illness therefore the patient will be placed on continuous pulse oximetry and telemetry for monitoring.  Laboratory evaluation will be sent to evaluate for the above complaints.      Clinical Course as of 01/17/22 1740  Sat Jan 17, 2022  1652 Patient reassessed.  Remains well-appearing in no acute distress she is requesting discharge home.  Patient agreeable to staying for repeat troponin. [PR]  1739 Patient has not with static.  Remains nontoxic well-appearing work appears been reassuring.  Has not provided urinalysis but denies any dysuria or symptoms of UTI.  She feels comfortable and is requesting discharge home.  We discussed return precautions. [PR]    Clinical Course User Index [PR] Merlyn Lot, MD      FINAL CLINICAL IMPRESSION(S) / ED DIAGNOSES   Final diagnoses:  Fainting spell     Rx / DC Orders   ED Discharge Orders     None        Note:  This document was prepared using Dragon voice recognition software and may include unintentional dictation errors.    Merlyn Lot, MD 01/17/22 1740

## 2022-01-17 NOTE — ED Notes (Signed)
Econd troponin drawn and sent.

## 2022-02-02 ENCOUNTER — Other Ambulatory Visit: Payer: Self-pay | Admitting: Cardiovascular Disease

## 2022-02-12 ENCOUNTER — Encounter: Payer: Self-pay | Admitting: Radiology

## 2022-02-12 ENCOUNTER — Observation Stay: Payer: Medicare Other

## 2022-02-12 ENCOUNTER — Other Ambulatory Visit: Payer: Self-pay

## 2022-02-12 ENCOUNTER — Inpatient Hospital Stay
Admission: EM | Admit: 2022-02-12 | Discharge: 2022-02-14 | DRG: 125 | Disposition: A | Discharge status: Home / Self Care | Payer: Medicare Other | Attending: Internal Medicine | Admitting: Internal Medicine

## 2022-02-12 ENCOUNTER — Emergency Department: Payer: Medicare Other

## 2022-02-12 DIAGNOSIS — R569 Unspecified convulsions: Secondary | ICD-10-CM

## 2022-02-12 DIAGNOSIS — H543 Unqualified visual loss, both eyes: Principal | ICD-10-CM | POA: Diagnosis present

## 2022-02-12 DIAGNOSIS — Z955 Presence of coronary angioplasty implant and graft: Secondary | ICD-10-CM

## 2022-02-12 DIAGNOSIS — R2990 Unspecified symptoms and signs involving the nervous system: Secondary | ICD-10-CM | POA: Diagnosis present

## 2022-02-12 DIAGNOSIS — M069 Rheumatoid arthritis, unspecified: Secondary | ICD-10-CM | POA: Diagnosis present

## 2022-02-12 DIAGNOSIS — Z79899 Other long term (current) drug therapy: Secondary | ICD-10-CM

## 2022-02-12 DIAGNOSIS — Z8249 Family history of ischemic heart disease and other diseases of the circulatory system: Secondary | ICD-10-CM

## 2022-02-12 DIAGNOSIS — Z8616 Personal history of COVID-19: Secondary | ICD-10-CM

## 2022-02-12 DIAGNOSIS — Z8673 Personal history of transient ischemic attack (TIA), and cerebral infarction without residual deficits: Secondary | ICD-10-CM

## 2022-02-12 DIAGNOSIS — Z7984 Long term (current) use of oral hypoglycemic drugs: Secondary | ICD-10-CM

## 2022-02-12 DIAGNOSIS — H539 Unspecified visual disturbance: Secondary | ICD-10-CM

## 2022-02-12 DIAGNOSIS — M1000 Idiopathic gout, unspecified site: Secondary | ICD-10-CM | POA: Diagnosis present

## 2022-02-12 DIAGNOSIS — Z85850 Personal history of malignant neoplasm of thyroid: Secondary | ICD-10-CM

## 2022-02-12 DIAGNOSIS — I110 Hypertensive heart disease with heart failure: Secondary | ICD-10-CM | POA: Diagnosis present

## 2022-02-12 DIAGNOSIS — E119 Type 2 diabetes mellitus without complications: Secondary | ICD-10-CM

## 2022-02-12 DIAGNOSIS — Z9841 Cataract extraction status, right eye: Secondary | ICD-10-CM

## 2022-02-12 DIAGNOSIS — Z9842 Cataract extraction status, left eye: Secondary | ICD-10-CM

## 2022-02-12 DIAGNOSIS — R55 Syncope and collapse: Secondary | ICD-10-CM

## 2022-02-12 DIAGNOSIS — Z888 Allergy status to other drugs, medicaments and biological substances status: Secondary | ICD-10-CM

## 2022-02-12 DIAGNOSIS — Z885 Allergy status to narcotic agent status: Secondary | ICD-10-CM

## 2022-02-12 DIAGNOSIS — I2510 Atherosclerotic heart disease of native coronary artery without angina pectoris: Secondary | ICD-10-CM | POA: Diagnosis present

## 2022-02-12 DIAGNOSIS — Z882 Allergy status to sulfonamides status: Secondary | ICD-10-CM

## 2022-02-12 DIAGNOSIS — H547 Unspecified visual loss: Secondary | ICD-10-CM | POA: Diagnosis not present

## 2022-02-12 DIAGNOSIS — I5032 Chronic diastolic (congestive) heart failure: Secondary | ICD-10-CM | POA: Diagnosis present

## 2022-02-12 DIAGNOSIS — G40909 Epilepsy, unspecified, not intractable, without status epilepticus: Secondary | ICD-10-CM | POA: Diagnosis present

## 2022-02-12 DIAGNOSIS — E785 Hyperlipidemia, unspecified: Secondary | ICD-10-CM | POA: Diagnosis present

## 2022-02-12 DIAGNOSIS — F419 Anxiety disorder, unspecified: Secondary | ICD-10-CM | POA: Diagnosis present

## 2022-02-12 DIAGNOSIS — Z66 Do not resuscitate: Secondary | ICD-10-CM | POA: Diagnosis present

## 2022-02-12 DIAGNOSIS — Z961 Presence of intraocular lens: Secondary | ICD-10-CM | POA: Diagnosis present

## 2022-02-12 DIAGNOSIS — E039 Hypothyroidism, unspecified: Secondary | ICD-10-CM | POA: Diagnosis present

## 2022-02-12 DIAGNOSIS — R7303 Prediabetes: Secondary | ICD-10-CM | POA: Diagnosis present

## 2022-02-12 DIAGNOSIS — I10 Essential (primary) hypertension: Secondary | ICD-10-CM | POA: Diagnosis present

## 2022-02-12 DIAGNOSIS — Z7902 Long term (current) use of antithrombotics/antiplatelets: Secondary | ICD-10-CM

## 2022-02-12 DIAGNOSIS — N390 Urinary tract infection, site not specified: Secondary | ICD-10-CM

## 2022-02-12 DIAGNOSIS — K219 Gastro-esophageal reflux disease without esophagitis: Secondary | ICD-10-CM | POA: Diagnosis present

## 2022-02-12 LAB — DIFFERENTIAL
Abs Immature Granulocytes: 0.04 10*3/uL (ref 0.00–0.07)
Basophils Absolute: 0 10*3/uL (ref 0.0–0.1)
Basophils Relative: 0 %
Eosinophils Absolute: 0 10*3/uL (ref 0.0–0.5)
Eosinophils Relative: 0 %
Immature Granulocytes: 1 %
Lymphocytes Relative: 9 %
Lymphs Abs: 0.7 10*3/uL (ref 0.7–4.0)
Monocytes Absolute: 0.3 10*3/uL (ref 0.1–1.0)
Monocytes Relative: 4 %
Neutro Abs: 6.5 10*3/uL (ref 1.7–7.7)
Neutrophils Relative %: 86 %

## 2022-02-12 LAB — URINALYSIS, ROUTINE W REFLEX MICROSCOPIC
Bilirubin Urine: NEGATIVE
Glucose, UA: NEGATIVE mg/dL
Hgb urine dipstick: NEGATIVE
Ketones, ur: NEGATIVE mg/dL
Nitrite: NEGATIVE
Protein, ur: 30 mg/dL — AB
Specific Gravity, Urine: 1.009 (ref 1.005–1.030)
pH: 6 (ref 5.0–8.0)

## 2022-02-12 LAB — COMPREHENSIVE METABOLIC PANEL
ALT: 17 U/L (ref 0–44)
AST: 29 U/L (ref 15–41)
Albumin: 3.7 g/dL (ref 3.5–5.0)
Alkaline Phosphatase: 61 U/L (ref 38–126)
Anion gap: 8 (ref 5–15)
BUN: 24 mg/dL — ABNORMAL HIGH (ref 8–23)
CO2: 25 mmol/L (ref 22–32)
Calcium: 8.7 mg/dL — ABNORMAL LOW (ref 8.9–10.3)
Chloride: 107 mmol/L (ref 98–111)
Creatinine, Ser: 1.01 mg/dL — ABNORMAL HIGH (ref 0.44–1.00)
GFR, Estimated: 54 mL/min — ABNORMAL LOW (ref >60)
Glucose, Bld: 146 mg/dL — ABNORMAL HIGH (ref 70–99)
Potassium: 4.6 mmol/L (ref 3.5–5.1)
Sodium: 140 mmol/L (ref 135–145)
Total Bilirubin: 0.7 mg/dL (ref 0.3–1.2)
Total Protein: 6.6 g/dL (ref 6.5–8.1)

## 2022-02-12 LAB — PROTIME-INR
INR: 1 (ref 0.8–1.2)
Prothrombin Time: 12.9 seconds (ref 11.4–15.2)

## 2022-02-12 LAB — ETHANOL: Alcohol, Ethyl (B): <10 mg/dL (ref <10)

## 2022-02-12 LAB — TSH: TSH: 0.402 u[IU]/mL (ref 0.350–4.500)

## 2022-02-12 LAB — CBC
HCT: 37.6 % (ref 36.0–46.0)
Hemoglobin: 11.8 g/dL — ABNORMAL LOW (ref 12.0–15.0)
MCH: 28.9 pg (ref 26.0–34.0)
MCHC: 31.4 g/dL (ref 30.0–36.0)
MCV: 92.2 fL (ref 80.0–100.0)
Platelets: 172 10*3/uL (ref 150–400)
RBC: 4.08 MIL/uL (ref 3.87–5.11)
RDW: 14.1 % (ref 11.5–15.5)
WBC: 7.5 10*3/uL (ref 4.0–10.5)
nRBC: 0 % (ref 0.0–0.2)

## 2022-02-12 LAB — URINE DRUG SCREEN, QUALITATIVE (ARMC ONLY)
Amphetamines, Ur Screen: NOT DETECTED
Barbiturates, Ur Screen: NOT DETECTED
Benzodiazepine, Ur Scrn: NOT DETECTED
Cannabinoid 50 Ng, Ur ~~LOC~~: NOT DETECTED
Cocaine Metabolite,Ur ~~LOC~~: NOT DETECTED
MDMA (Ecstasy)Ur Screen: NOT DETECTED
Methadone Scn, Ur: NOT DETECTED
Opiate, Ur Screen: NOT DETECTED
Phencyclidine (PCP) Ur S: NOT DETECTED
Tricyclic, Ur Screen: NOT DETECTED

## 2022-02-12 LAB — HEMOGLOBIN A1C
Hgb A1c MFr Bld: 5.9 % — ABNORMAL HIGH (ref 4.8–5.6)
Mean Plasma Glucose: 122.63 mg/dL

## 2022-02-12 LAB — APTT: aPTT: 24 seconds (ref 24–36)

## 2022-02-12 LAB — TROPONIN I (HIGH SENSITIVITY)
Troponin I (High Sensitivity): 13 ng/L (ref <18)
Troponin I (High Sensitivity): 11 ng/L (ref <18)

## 2022-02-12 LAB — T4, FREE: Free T4: 0.96 ng/dL (ref 0.61–1.12)

## 2022-02-12 LAB — SEDIMENTATION RATE: Sed Rate: 11 mm/hr (ref 0–30)

## 2022-02-12 MED ORDER — SODIUM CHLORIDE 0.9 % IV SOLN
1.0000 g | INTRAVENOUS | Status: DC
  Administered 2022-02-13: 1 g via INTRAVENOUS
  Filled 2022-02-12: qty 1

## 2022-02-12 MED ORDER — SODIUM CHLORIDE 0.9 % IV SOLN
1.0000 g | Freq: Once | INTRAVENOUS | Status: AC
  Administered 2022-02-12: 1 g via INTRAVENOUS
  Filled 2022-02-12: qty 10

## 2022-02-12 MED ORDER — IOHEXOL 350 MG/ML SOLN
75.0000 mL | Freq: Once | INTRAVENOUS | Status: AC | PRN
  Administered 2022-02-12: 75 mL via INTRAVENOUS

## 2022-02-12 MED ORDER — CLOPIDOGREL BISULFATE 75 MG PO TABS
75.0000 mg | ORAL_TABLET | Freq: Every day | ORAL | Status: DC
  Administered 2022-02-13 – 2022-02-14 (×2): 75 mg via ORAL
  Filled 2022-02-12 (×2): qty 1

## 2022-02-12 MED ORDER — ENOXAPARIN SODIUM 40 MG/0.4ML IJ SOSY
40.0000 mg | PREFILLED_SYRINGE | INTRAMUSCULAR | Status: DC
  Administered 2022-02-13 – 2022-02-14 (×2): 40 mg via SUBCUTANEOUS
  Filled 2022-02-12 (×2): qty 0.4

## 2022-02-12 MED ORDER — ACETAMINOPHEN 325 MG PO TABS: 650.0000 mg | ORAL_TABLET | ORAL | Status: DC | PRN

## 2022-02-12 MED ORDER — ACETAMINOPHEN 160 MG/5ML PO SOLN: 650.0000 mg | ORAL | Status: DC | PRN

## 2022-02-12 MED ORDER — ROSUVASTATIN CALCIUM 10 MG PO TABS
5.0000 mg | ORAL_TABLET | Freq: Every day | ORAL | Status: DC
  Administered 2022-02-13 – 2022-02-14 (×2): 5 mg via ORAL
  Filled 2022-02-12 (×2): qty 1

## 2022-02-12 MED ORDER — ACETAMINOPHEN 650 MG RE SUPP: 650.0000 mg | RECTAL | Status: DC | PRN

## 2022-02-12 MED ORDER — METOPROLOL SUCCINATE ER 25 MG PO TB24
12.5000 mg | ORAL_TABLET | Freq: Every day | ORAL | Status: DC
  Administered 2022-02-13 – 2022-02-14 (×2): 12.5 mg via ORAL
  Filled 2022-02-12 (×2): qty 1

## 2022-02-12 MED ORDER — TRAZODONE HCL 50 MG PO TABS
25.0000 mg | ORAL_TABLET | Freq: Every evening | ORAL | Status: DC | PRN
  Administered 2022-02-13: 25 mg via ORAL
  Filled 2022-02-12 (×2): qty 1

## 2022-02-12 MED ORDER — SENNOSIDES-DOCUSATE SODIUM 8.6-50 MG PO TABS: 1.0000 | ORAL_TABLET | Freq: Every evening | ORAL | Status: DC | PRN

## 2022-02-12 MED ORDER — ISOSORBIDE MONONITRATE ER 30 MG PO TB24
30.0000 mg | ORAL_TABLET | Freq: Every day | ORAL | Status: DC
  Administered 2022-02-13 – 2022-02-14 (×2): 30 mg via ORAL
  Filled 2022-02-12 (×2): qty 1

## 2022-02-12 MED ORDER — LEVOTHYROXINE SODIUM 100 MCG PO TABS
100.0000 ug | ORAL_TABLET | Freq: Every day | ORAL | Status: DC
  Administered 2022-02-13 – 2022-02-14 (×2): 100 ug via ORAL
  Filled 2022-02-12 (×2): qty 1

## 2022-02-12 MED ORDER — STROKE: EARLY STAGES OF RECOVERY BOOK: Freq: Once | Status: AC

## 2022-02-12 MED ORDER — HYDRALAZINE HCL 20 MG/ML IJ SOLN
5.0000 mg | Freq: Four times a day (QID) | INTRAMUSCULAR | Status: DC | PRN
  Administered 2022-02-12: 5 mg via INTRAVENOUS

## 2022-02-12 MED ORDER — ALLOPURINOL 100 MG PO TABS
100.0000 mg | ORAL_TABLET | Freq: Two times a day (BID) | ORAL | Status: DC
  Administered 2022-02-13 – 2022-02-14 (×3): 100 mg via ORAL
  Filled 2022-02-12 (×3): qty 1

## 2022-02-12 MED ORDER — HYDRALAZINE HCL 20 MG/ML IJ SOLN
5.0000 mg | Freq: Once | INTRAMUSCULAR | Status: DC
  Filled 2022-02-12: qty 1

## 2022-02-12 MED ORDER — LEVETIRACETAM 250 MG PO TABS
250.0000 mg | ORAL_TABLET | Freq: Two times a day (BID) | ORAL | Status: DC
  Administered 2022-02-13 – 2022-02-14 (×3): 250 mg via ORAL
  Filled 2022-02-12 (×4): qty 1

## 2022-02-12 NOTE — H&P (Addendum)
History and Physical:    [name]   JSH:[id number] DOB: 1933-11-19 DOA: 02/12/2022  Referring MD/provider: Merlyn Lot, MD PCP: Idelle Crouch, MD   Patient coming from: Home  Chief Complaint: Visual impairment  History of Present Illness:   [name] is a 89 y.o. female  with hypertension, hypothyroid, hyperlipidemia, CAD with severe LAD and RCA disease status post PCI with DES in the right coronary artery in 05/31/2014, and rotoblade on 06/01/14 to LAD, chronic diastolic CHF, history of seizure, idiopathic gout, non-insulin-dependent diabetes mellitus, history of COVID-19 infection.  She presented to the hospital for evaluation of stroke at the behest of her ophthalmologist.  She said she has been having problems with her eyes for some time now.  It started in the right eye about 2 months ago and she has been getting shots in the right eye without any significant improvement.  She developed a problem with the left eye about 2 weeks prior to admission.  She went to her ophthalmologist on the day of admission, and she was told that it may be "a central problem" so she was advised to come to the emergency department for further management.  She has occasional headaches.  She said she has also been passing out within the past 2 weeks.  She normally does not lose consciousness and she is aware of her surroundings when she passes out.  She usually knows when she is about to pass out.  She reports numbness/weakness in the left arm since a fall about 2 months ago.  Otherwise no new numbness or weakness in her extremities.  No changes in speech, she has an occasional headache.  No chest pain, shortness of breath, fever, chills, abdominal pain, vomiting, diarrhea or any urinary symptoms.  ED Course: She was given IV ceftriaxone in the ED for suspected UTI  ROS:   ROS all other systems reviewed were negative  Past Medical History:   Past Medical History:  Diagnosis  Date   CAD (coronary artery disease)    a. lexiscan 08/2013: nl wall motion, no ischemia, EF 50-55%; b. cardiac cath 05/31/2014: mLAD 90%, dLAD 50%, dLCx 60%, 1st Mrg 80%, pRCA 60% s/p PCI/DES, mRCA 1st lesion 70%, mRCA 2nd 95% s/p PCI/long DES to cover entire mRCA, RPLB 40%;  c. 05/2014 Staged PCI of LAD w/ 2.5 x 15 mm Xience [place] DES.   CAD (coronary artery disease) 02/11/2021   Carotid artery disease (HCC)    Chronic anxiety    Diverticulosis    Esophagitis    GERD (gastroesophageal reflux disease)    History of colon polyps    History of echocardiogram    a. 08/2013: normal LVSF, nl RVSF, no valvular regurgitation or stenosis    Hyperlipidemia    Hypertension    Hypothyroidism    Mild reactive airways disease    Osteoarthritis    PONV (postoperative nausea and vomiting)    Rheumatoid arthritis ([place])    Stroke ([place])    a. 05/2014 pt c/o garbled speech following cath 5/19. MRI/A 5/26 showed left caudate infarct->conservative mgmt per neuro.   Thyroid cancer ([place])    a. s/p right sided hemi-thyroidectomy; b. planning for radioactive iodine tx; c. followed by Dr. Marcelene Butte   Type 2 diabetes mellitus [facility])     Past Surgical History:   Past Surgical History:  Procedure Laterality Date   ABDOMINAL HYSTERECTOMY     APPENDECTOMY     AUGMENTATION MAMMAPLASTY     BACK  SURGERY     BREAST IMPLANT REMOVAL  06/2001   CARDIAC CATHETERIZATION N/A 06/06/2014   Procedure: Coronary/Graft Atherectomy;  Surgeon: Wellington Hampshire, MD;  Location: [place] [facility];  Service: Cardiovascular;  Laterality: N/A;   CARDIAC CATHETERIZATION N/A 05/31/2014   Procedure: Left Heart Cath;  Surgeon: Minna Merritts, MD;  Location: [place] [facility];  Service: Cardiovascular;  Laterality: N/A;   CARDIAC CATHETERIZATION N/A 05/31/2014   Procedure: Coronary Stent Intervention;  Surgeon: Wellington Hampshire, MD;  Location: [place] [facility];  Service: Cardiovascular;  Laterality: N/A;   CARPAL TUNNEL RELEASE      "I've have a total of 7" (06/06/2014)   CATARACT EXTRACTION W/ INTRAOCULAR LENS  IMPLANT, BILATERAL Bilateral    CHOLECYSTECTOMY     CORONARY ANGIOPLASTY WITH STENT PLACEMENT  05/31/2014; 06/06/2014   "2; 1"   GANGLION CYST EXCISION Right    AC joint   LUMBAR LAMINECTOMY  X 6   MENISCUS REPAIR Bilateral    "2 on the right; 1 on the left"   ROTATOR CUFF REPAIR     "I think 2 left, 2 right"   THYROIDECTOMY     TONSILLECTOMY     TUBAL LIGATION      Social History:   Social History   Socioeconomic History   Marital status: Married    Spouse name: Not on file   Number of children: Not on file   Years of education: Not on file   Highest education level: Not on file  Occupational History   Occupation: retired  Tobacco Use   Smoking status: Never   Smokeless tobacco: Never  Vaping Use   Vaping Use: Never used  Substance and Sexual Activity   Alcohol use: No   Drug use: No   Sexual activity: Not Currently  Other Topics Concern   Not on file  Social History Narrative   Not on file   Social Determinants of Health   Financial Resource Strain: Not on file  Food Insecurity: No Food Insecurity (10/14/2021)   Hunger Vital Sign    Worried About Running Out of Food in the Last Year: Never true    Ran Out of Food in the Last Year: Never true  Transportation Needs: Unmet Transportation Needs (10/14/2021)   PRAPARE - Hydrologist (Medical): No    Lack of Transportation (Non-Medical): Yes  Physical Activity: Not on file  Stress: Not on file  Social Connections: Not on file  Intimate Partner Violence: Not At Risk (10/14/2021)   Humiliation, Afraid, Rape, and Kick questionnaire    Fear of Current or Ex-Partner: No    Emotionally Abused: No    Physically Abused: No    Sexually Abused: No    Allergies   Ciprofloxacin, Codeine, Metronidazole, Other, Petrolatum-zinc oxide, Sulfa antibiotics, Neomycin-bacitracin zn-polymyx, Bacitracin-polymyxin b,  Celecoxib, Neomycin, Statins, and Vioxx [rofecoxib]  Family history:   Family History  Problem Relation Age of Onset   Heart disease Sister        stent placed x 3     Current Medications:   Prior to Admission medications   Medication Sig Start Date End Date Taking? Authorizing Provider  acetaminophen (TYLENOL) 500 MG tablet Take 500-1,000 mg by mouth every 6 (six) hours as needed for mild pain or moderate pain.    [provider]  albuterol (VENTOLIN HFA) 108 (90 Base) MCG/ACT inhaler Inhale 2 puffs into the lungs every 6 (six) hours as needed for  wheezing or shortness of breath. 10/15/21   Sidney Ace, MD  allopurinol (ZYLOPRIM) 100 MG tablet TAKE 1 TABLET(100 MG) BY MOUTH TWICE DAILY 11/14/18   Newt Minion, MD  benzonatate (TESSALON PERLES) 100 MG capsule Take 1 capsule (100 mg total) by mouth 3 (three) times daily as needed for cough. 10/15/21 10/15/22  Sidney Ace, MD  clopidogrel (PLAVIX) 75 MG tablet Take 1 tablet (75 mg total) by mouth daily. 09/30/21   Minna Merritts, MD  Colchicine (MITIGARE) 0.6 MG CAPS Take 0.6 mg by mouth daily as needed. 03/05/20   Minna Merritts, MD  furosemide (LASIX) 20 MG tablet TAKE 1 TABLET BY MOUTH DAILY AS  NEEDED FOR FLUID (TAKE WITH  POTASSIUM) 02/02/22   Gollan, Kathlene November, MD  guaiFENesin-dextromethorphan (ROBITUSSIN DM) 100-10 MG/5ML syrup Take 10 mLs by mouth every 4 (four) hours as needed for cough. 10/15/21   Sidney Ace, MD  isosorbide mononitrate (IMDUR) 30 MG 24 hr tablet Take 1 tablet (30 mg total) by mouth daily. 09/30/21   Minna Merritts, MD  levETIRAcetam (KEPPRA) 250 MG tablet Take 1 tablet (250 mg total) by mouth 2 (two) times daily. 08/27/20   Lorella Nimrod, MD  levothyroxine (SYNTHROID) 100 MCG tablet Take 100 mcg by mouth daily. 09/17/21   [provider]  levothyroxine (SYNTHROID) 75 MCG tablet Take 75 mcg by mouth daily. 11/07/20   [provider]  metoprolol succinate (TOPROL-XL)  25 MG 24 hr tablet Take 0.5 tablets (12.5 mg total) by mouth daily. 09/30/21   Minna Merritts, MD  Multiple Vitamins-Minerals (ICAPS AREDS 2 PO) Take 1 tablet by mouth 2 (two) times daily.    [provider]  nitroGLYCERIN (NITROSTAT) 0.4 MG SL tablet Place 1 tablet (0.4 mg total) under the tongue every 5 (five) minutes as needed for chest pain. 09/30/21   Minna Merritts, MD  ondansetron (ZOFRAN-ODT) 4 MG disintegrating tablet Take 1 tablet (4 mg total) by mouth every 8 (eight) hours as needed for nausea or vomiting. 10/11/21   Carrie Mew, MD  potassium chloride (KLOR-CON) 10 MEQ tablet TAKE 1 TABLET BY MOUTH DAILY AS  NEEDED FOR NUTRIENT  REPLENISHMENT TAKE WITH  FUROSEMIDE 02/02/22   Minna Merritts, MD  rosuvastatin (CRESTOR) 10 MG tablet Take 10 mg by mouth daily. 04/03/21   [provider]  rosuvastatin (CRESTOR) 5 MG tablet TAKE 1 TABLET(5 MG) BY MOUTH DAILY 09/30/21   Minna Merritts, MD  traZODone (DESYREL) 50 MG tablet Take 0.5 tablets (25 mg total) by mouth at bedtime as needed for sleep. 02/14/21   Loletha Grayer, MD    Physical Exam:   Vitals:   02/12/22 1600 02/12/22 1830 02/12/22 1900 02/12/22 2000  BP: (!) 168/67 (!) 184/67 (!) 185/66 (!) 172/78  Pulse: 74 72 72 73  Resp:  '18 15 13  '$ Temp: 98.8 F (37.1 C)     TempSrc:      SpO2: 100% 99% 100% 100%  Weight:      Height:         Physical Exam: Blood pressure (!) 172/78, pulse 73, temperature 98.8 F (37.1 C), resp. rate 13, height '5\' 1"'$  (1.549 m), weight 56.7 kg, SpO2 100 %. Gen: No acute distress. Head: Normocephalic, atraumatic. Eyes: Pupils equal, round and reactive to light. Extraocular movements intact.  Sclerae nonicteric.  No visual field cut, no nystagmus, she is able to count fingers. Mouth: Moist mucous membranes Neck: Supple, no thyromegaly, no lymphadenopathy,  no jugular venous distention. Chest: Lungs are clear to auscultation with good air movement. No rales, rhonchi or  wheezes.  CV: Heart sounds are regular with an S1, S2. No murmurs, rubs or gallops.  Abdomen: Soft, nontender, nondistended with normal active bowel sounds. No palpable masses. Extremities: Extremities are without clubbing, or cyanosis. No edema. Pedal pulses 2+.  Skin: Warm and dry. No rashes, lesions or wounds Neuro: Alert and oriented times 3; grossly nonfocal.  Psych: Insight is good and judgment is appropriate. Mood and affect normal.   Data Review:    Labs: Basic Metabolic Panel: Recent Labs  Lab 02/12/22 1430  NA 140  K 4.6  CL 107  CO2 25  GLUCOSE 146*  BUN 24*  CREATININE 1.01*  CALCIUM 8.7*   Liver Function Tests: Recent Labs  Lab 02/12/22 1430  AST 29  ALT 17  ALKPHOS 61  BILITOT 0.7  PROT 6.6  ALBUMIN 3.7   No results for input(s): "LIPASE", "AMYLASE" in the last 168 hours. No results for input(s): "AMMONIA" in the last 168 hours. CBC: Recent Labs  Lab 02/12/22 1219  WBC 7.5  NEUTROABS 6.5  HGB 11.8*  HCT 37.6  MCV 92.2  PLT 172   Cardiac Enzymes: No results for input(s): "CKTOTAL", "CKMB", "CKMBINDEX", "TROPONINI" in the last 168 hours.  BNP (last 3 results) No results for input(s): "PROBNP" in the last 8760 hours. CBG: No results for input(s): "GLUCAP" in the last 168 hours.  Urinalysis    Component Value Date/Time   COLORURINE YELLOW (A) 02/12/2022 1219   APPEARANCEUR CLEAR (A) 02/12/2022 1219   APPEARANCEUR Hazy 10/31/2013 1407   LABSPEC 1.009 02/12/2022 1219   LABSPEC 1.011 10/31/2013 1407   PHURINE 6.0 02/12/2022 1219   GLUCOSEU NEGATIVE 02/12/2022 1219   GLUCOSEU Negative 10/31/2013 1407   HGBUR NEGATIVE 02/12/2022 1219   BILIRUBINUR NEGATIVE 02/12/2022 1219   BILIRUBINUR Negative 10/31/2013 1407   KETONESUR NEGATIVE 02/12/2022 1219   PROTEINUR 30 (A) 02/12/2022 1219   UROBILINOGEN 0.2 09/20/2009 1206   NITRITE NEGATIVE 02/12/2022 1219   LEUKOCYTESUR TRACE (A) 02/12/2022 1219   LEUKOCYTESUR Trace 10/31/2013 1407       Radiographic Studies: CT Angio Head Neck W WO CM  Result Date: 02/12/2022 CLINICAL DATA:  Neuro deficit, acute, stroke suspected. Left visual changes over the last 2 weeks. EXAM: CT HEAD WITHOUT CONTRAST CT ANGIOGRAPHY OF THE HEAD AND NECK TECHNIQUE: Contiguous axial images were obtained from the base of the skull through the vertex without intravenous contrast. Multidetector CT imaging of the head and neck was performed using the standard protocol during bolus administration of intravenous contrast. Multiplanar CT image reconstructions and MIPs were obtained to evaluate the vascular anatomy. Carotid stenosis measurements (when applicable) are obtained utilizing NASCET criteria, using the distal internal carotid diameter as the denominator. RADIATION DOSE REDUCTION: This exam was performed according to the departmental dose-optimization program which includes automated exposure control, adjustment of the mA and/or kV according to patient size and/or use of iterative reconstruction technique. CONTRAST:  27m OMNIPAQUE IOHEXOL 350 MG/ML SOLN COMPARISON:  Brain MRI 02/12/2021. FINDINGS: CT HEAD Brain: No acute intracranial hemorrhage. Patchy and confluence hypoattenuation of the cerebral white matter, consistent with chronic small-vessel disease. Gray-white differentiation is otherwise preserved. No mass effect or midline shift. No extra-axial collection or hydrocephalus. Basilar cisterns are patent. Vascular: No hyperdense vessel or unexpected calcification. Skull: No calvarial fracture or suspicious bone lesion. Severe degenerative changes of the left temporomandibular joint. Moderate degenerative changes of the  right TMJ. Sinuses/Orbits: Paranasal sinuses, mastoid air cells, and middle ear cavities are well aerated. Orbits are unremarkable. CTA NECK Aortic arch: Three-vessel arch configuration. Atherosclerotic calcifications of the aortic arch and arch vessel origins. Arch vessel origins are patent.  Right carotid system: Tortuous proximal common carotid. Calcified plaque along the carotid bulb and proximal right ICA without hemodynamically significant stenosis. Left carotid system: Tortuous left common carotid. Calcified plaque along the carotid bulb and proximal left ICA without hemodynamically significant stenosis. Vertebral arteries:Patent from the origin to the confluence with the basilar without stenosis or dissection. Skeleton: Type 2 dens fracture with 4 mm of anterior displacement (sagittal image 108 series 15). Sclerosis along the fracture margins suggests some degree of chronicity. Moderate surrounding pannus results in mild spinal canal stenosis at this level. Degenerative anterolisthesis of C4 on C5. Multilevel degenerative changes result in at least mild spinal canal stenosis at C4-5 and C6-7. No suspicious bone lesions. Other neck: Unremarkable. CTA HEAD Anterior circulation: Dense atherosclerotic calcifications of the carotid siphons without hemodynamically significant stenosis. The proximal ACAs and MCAs are patent without stenosis or aneurysm. Distal branches are symmetric. Posterior circulation: Diminutive basilar artery. The SCAs, AICAs and PICAs are patent proximally. The PCAs are patent proximally without stenosis or aneurysm. Distal branches are symmetric. Venous sinuses: Patent. Anatomic variants: Persistent fetal origin of both PCAs with left-greater-than-right hypoplasia of the P1 segments. IMPRESSION: 1. No acute intracranial abnormality. 2. No large vessel occlusion or hemodynamically significant stenosis of the head or neck vessels. 3. Subacute to chronic type 2 dens fracture with 4 mm of anterior displacement. Moderate surrounding pannus results in mild spinal canal stenosis at this level. No evidence of vertebral artery dissection. 4. Aortic Atherosclerosis (ICD10-I70.0). Electronically Signed   By: Emmit Alexanders M.D.   On: 02/12/2022 17:09    EKG: Independently reviewed by me  showed normal sinus rhythm.    Assessment/Plan:   Principal Problem:   Visual impairment Active Problems:   Stroke-like symptom   Essential hypertension   Diabetes mellitus without complication (HCC)   Seizure (HCC)   CAD (coronary artery disease)   Chronic diastolic CHF (congestive heart failure) (HCC)    Body mass index is 23.62 kg/m.   Abnormal vision in the eye, suspected stroke in the patient with previous stroke: No acute intracranial abnormality on CTA head and neck, no large vessel occlusion or hemodynamically significant stenosis of the head or neck vessels.  MRI brain has been ordered for further evaluation.  Obtain 2D echo for further evaluation.  Continue Plavix and rosuvastatin.  Consult neurologist tomorrow.  Probable acute UTI: Treat with IV Rocephin.  Follow-up urine cultures.  Carotid artery stenosis, CAD with coronary stent: Continue Plavix and rosuvastatin  Seizure disorder: Continue Keppra  Other comorbidities include hypothyroidism, gout, chronic diastolic CHF, GERD, hypertension, anxiety     Other information:   DVT prophylaxis: enoxaparin (LOVENOX) injection 40 mg Start: 02/13/22 1000Lovenox  Code Status: DNR.  I personally discussed CODE STATUS with the patient who said she wants to be DNR.. Family Communication: None  Disposition Plan: Plan to discharge home in 1 to 2 days Consults called: None Admission status: Observation   The medical decision making is of moderate complexity, therefore this is a level 2 visit.    Myrla Malanowski [facility] Pager: Please check [email or web address]   How to contact the [facility] Attending or Consulting provider [place] or covering provider during after hours [place], for this patient?   Check the care  team in [facility] and look for a) attending/consulting [place] provider listed and b) the [facility] team listed Log into [email or web address] and use [place]'s universal password to access. If you do not have the password, please  contact the hospital operator. Locate the [facility] provider you are looking for under [facility] and page to a number that you can be directly reached. If you still have difficulty reaching the provider, please page the [facility] (Director on Call) for the Hospitalists listed on amion for assistance.  02/12/2022, 9:55 PM

## 2022-02-12 NOTE — ED Triage Notes (Signed)
Pt here with a possible stroke. Pt states her right eye started causing her issues 2 weeks ago and the left about a week ago. Pt told to come to the ED to be evaluated for a stroke.  Pt states she is also having weakness as well.

## 2022-02-12 NOTE — ED Notes (Signed)
This RN and other RN attempted x 2 to start IV access without success, IV team consult placed.

## 2022-02-12 NOTE — ED Notes (Signed)
CT called, they know the pt has an IV and that blood has been sent to lab.

## 2022-02-12 NOTE — ED Notes (Addendum)
Ayiku MD notified of pt passing swallow screen, awaiting orders at this time

## 2022-02-12 NOTE — ED Notes (Signed)
NP Randol Kern notified of last two bp's having systolic's >357. Hydralazine ordered.

## 2022-02-12 NOTE — ED Notes (Signed)
EKG completed by nurse tech and given to MD. Pt hooked up to the cardiac monitor. Vitals are stable at this time. Pt denies chest pain, shortness of breath, feeling dizzy or lightheaded.

## 2022-02-12 NOTE — ED Notes (Signed)
Writer attempted to get IV and blood but no success. Assisting RN trying at this time.

## 2022-02-12 NOTE — ED Provider Notes (Signed)
[facility] Provider Note    Event Date/Time   First MD Initiated Contact with Patient 02/12/22 1153     (approximate)   History   Eye Problem   HPI  [name] is a 89 y.o. female   Past medical history of CAD, lipidemia, hypertension, hypothyroid, rheumatoid arthritis, prior stroke, thyroid cancer and diabetes who presents with left visual changes over the last 2 weeks concerning for stroke sent in by ophthalmologist from eye clinic today.  Symptoms started 2 weeks ago and progressively worsening left eye visual changes.  No other acute neurologic symptoms reported by patient, her right eye vision is at baseline very poor.  The only vision changes are affecting her.  However over the last 3 to 4 weeks has had several discrete episodes of presyncope and syncope, feels dizzy, feels like she is going to pass out, has passed out twice without was over 2 weeks ago and has had several episodes of presyncope but no loss of consciousness in the last 1 to 2 weeks.  Denies chest pain shortness of breath or palpitations during these events.  She states that she first saw an eye doctor 2 weeks ago during onset of symptoms and was given an injection in both eyes, and then followed up for a visit at the eye clinic today due to worsening, reports having a full eye exam at the eye clinic and then was advised to come to the emergency department because of suspicion of stroke.    Independent Historian contributed to assessment above: Spent who is at bedside.  External Medical Documents Reviewed: Telephone encounter by internal medicine Dr. Doy Hutching, noting a conversation with Dr. George Ina of ophthalmology with vision loss concerning for stroke, informing primary doctor that patient will be coming to the emergency department for evaluation.      Physical Exam   Triage Vital Signs: ED Triage Vitals  Enc Vitals Group     BP 02/12/22 1153 (!) 188/61     Pulse Rate  02/12/22 1152 69     Resp 02/12/22 1152 16     Temp 02/12/22 1152 97.9 F (36.6 C)     Temp Source 02/12/22 1152 Oral     SpO2 02/12/22 1152 98 %     Weight 02/12/22 1153 125 lb (56.7 kg)     Height 02/12/22 1153 '5\' 1"'$  (1.549 m)     Head Circumference --      Peak Flow --      Pain Score 02/12/22 1153 2     Pain Loc --      Pain Edu? --      Excl. in [place]? --     Most recent vital signs: Vitals:   02/12/22 1153 02/12/22 1500  BP: (!) 188/61 (!) 172/69  Pulse:  73  Resp:  16  Temp:    SpO2:  100%   General: Awake, no distress.  CV:  Good peripheral perfusion.  Resp:  Normal effort.  Abd:  No distention.  Other:  Awake alert oriented comfortable, with visual acuity poor in both eyes, acutely in the left only able to see digits held.  No other focal neurologic deficits including facial asymmetry, dysarthria, finger-to-nose, motor or sensory in all extremities.   ED Results / Procedures / Treatments   Labs (all labs ordered are listed, but only abnormal results are displayed) Labs Reviewed  CBC - Abnormal; Notable for the following components:      Result Value  Hemoglobin 11.8 (*)    All other components within normal limits  URINALYSIS, ROUTINE W REFLEX MICROSCOPIC - Abnormal; Notable for the following components:   Color, Urine YELLOW (*)    APPearance CLEAR (*)    Protein, ur 30 (*)    Leukocytes,Ua TRACE (*)    Bacteria, UA MANY (*)    All other components within normal limits  DIFFERENTIAL  URINE DRUG SCREEN, QUALITATIVE ([facility] ONLY)  SEDIMENTATION RATE  TSH  T4, FREE  ETHANOL  COMPREHENSIVE METABOLIC PANEL  PROTIME-INR  APTT  TROPONIN I (HIGH SENSITIVITY)  TROPONIN I (HIGH SENSITIVITY)     I ordered and reviewed the above labs they are notable for UA +bacteria and leukocytes.  White blood cell count is normal  EKG  ED ECG REPORT I, Lucillie Garfinkel, the attending physician, personally viewed and interpreted this ECG.   Date: 02/12/2022  EKG Time:  1202  Rate: 70  Rhythm: NSR  Axis: nl  Intervals:none  ST&T Change: no ischemic changes acutely    PROCEDURES:  Critical Care performed: No  Procedures   MEDICATIONS ORDERED IN ED: Medications  cefTRIAXone (ROCEPHIN) 1 g in sodium chloride 0.9 % 100 mL IVPB (has no administration in time range)    External physician / consultants:  I spoke with Dr. George Ina ophthalmologist regarding care plan for this patient on examination arterial attenuation in the retina, concerning for central retinal artery occlusion or TIA, otherwise normal ophthalmology evaluation.  IMPRESSION / MDM / ASSESSMENT AND PLAN / ED COURSE  I reviewed the triage vital signs and the nursing notes.                                Patient's presentation is most consistent with acute presentation with potential threat to life or bodily function.  Differential diagnosis includes, but is not limited to, CVA, TIA, central retinal artery occlusion, central retinal vein occlusion, glaucoma, retinal detachment, vitreous hemorrhage, intracranial bleeding, diabetic complications, optic neuritis, giant cell arteritis, arrthymia/acs  The patient is on the cardiac monitor to evaluate for evidence of arrhythmia and/or significant heart rate changes.  MDM: With subacute vision loss to the left eye with no other acute neurologic symptoms, evaluated by ophthalmologist and concern for CVA sent to the emergency department, outside of stroke window, obtain CT angiogram, basic labs stroke labs.  Painless vision loss doubt inflammatory/vasculitic pathologies.  Other discrete neurologic episodes presyncope/syncope as well, keep on telemetry monitoring.  Check EKG and troponins.  Check thyroid.  Urinalysis with bacteria and leukocytes, will treat with IV Rocephin. Patient stable awaiting CT angiogram. Signed out to oncoming provider pending workup as above.       FINAL CLINICAL IMPRESSION(S) / ED DIAGNOSES   Final  diagnoses:  Vision changes  Syncope, unspecified syncope type  Urinary tract infection without hematuria, site unspecified     Rx / DC Orders   ED Discharge Orders     None        Note:  This document was prepared using Dragon voice recognition software and may include unintentional dictation errors.    Lucillie Garfinkel, MD 02/12/22 [phone]

## 2022-02-12 NOTE — ED Notes (Signed)
Pt's repeat bp <437 systolic. Will monitor vs and give if systolic increases >357.

## 2022-02-13 ENCOUNTER — Observation Stay: Payer: Medicare Other

## 2022-02-13 ENCOUNTER — Encounter: Payer: Self-pay | Admitting: Internal Medicine

## 2022-02-13 ENCOUNTER — Observation Stay (HOSPITAL_COMMUNITY)
Admit: 2022-02-13 | Discharge: 2022-02-13 | Disposition: A | Discharge status: Home / Self Care | Payer: Medicare Other | Attending: Internal Medicine | Admitting: Internal Medicine

## 2022-02-13 DIAGNOSIS — I5032 Chronic diastolic (congestive) heart failure: Secondary | ICD-10-CM | POA: Diagnosis not present

## 2022-02-13 DIAGNOSIS — H547 Unspecified visual loss: Secondary | ICD-10-CM | POA: Diagnosis not present

## 2022-02-13 DIAGNOSIS — R569 Unspecified convulsions: Secondary | ICD-10-CM

## 2022-02-13 DIAGNOSIS — R2990 Unspecified symptoms and signs involving the nervous system: Secondary | ICD-10-CM | POA: Diagnosis not present

## 2022-02-13 LAB — ECHOCARDIOGRAM COMPLETE
AR max vel: 2.09 cm2
AV Area VTI: 2.28 cm2
AV Area mean vel: 2.09 cm2
AV Mean grad: 5 mmHg
AV Peak grad: 8.9 mmHg
Ao pk vel: 1.49 m/s
Area-P 1/2: 3.36 cm2
Height: 61 in
S' Lateral: 2.7 cm
Weight: 2000.01 oz

## 2022-02-13 LAB — LIPID PANEL
Cholesterol: 195 mg/dL (ref 0–200)
HDL: 65 mg/dL (ref >40)
LDL Cholesterol: 84 mg/dL (ref 0–99)
Total CHOL/HDL Ratio: 3 RATIO
Triglycerides: 229 mg/dL — ABNORMAL HIGH (ref <150)
VLDL: 46 mg/dL — ABNORMAL HIGH (ref 0–40)

## 2022-02-13 LAB — C-REACTIVE PROTEIN: CRP: <0.5 mg/dL (ref <1.0)

## 2022-02-13 LAB — SEDIMENTATION RATE: Sed Rate: 12 mm/hr (ref 0–30)

## 2022-02-13 MED ORDER — PREDNISONE 10 MG PO TABS
10.0000 mg | ORAL_TABLET | Freq: Every day | ORAL | Status: AC
  Administered 2022-02-13 – 2022-02-14 (×2): 10 mg via ORAL
  Filled 2022-02-13 (×2): qty 1

## 2022-02-13 MED ORDER — PREDNISONE 10 MG PO TABS: 10.0000 mg | ORAL_TABLET | Freq: Every day | ORAL | Status: DC

## 2022-02-13 MED ORDER — GADOBUTROL 1 MMOL/ML IV SOLN
6.0000 mL | Freq: Once | INTRAVENOUS | Status: AC | PRN
  Administered 2022-02-13: 6 mL via INTRAVENOUS

## 2022-02-13 MED ORDER — ONDANSETRON HCL 4 MG/2ML IJ SOLN
4.0000 mg | Freq: Four times a day (QID) | INTRAMUSCULAR | Status: DC | PRN
  Administered 2022-02-13: 4 mg via INTRAVENOUS
  Filled 2022-02-13: qty 2

## 2022-02-13 NOTE — Evaluation (Signed)
Occupational Therapy Evaluation Patient Details Name: [name] MRN: [id number] DOB: March 01, 1933 Today's Date: 02/13/2022   History of Present Illness 89 y.o. female    Past medical history of CAD, lipidemia, hypertension, hypothyroid, rheumatoid arthritis, prior stroke, thyroid cancer and diabetes who presents with left visual changes over the last 2 weeks concerning for stroke sent in by ophthalmologist from eye clinic today.  Symptoms started 2 weeks ago and progressively worsening left eye visual changes.  MRI negative.Subacute to chronic cervical dens fx.   Clinical Impression   Patient presenting with decreased Ind in self care,balance, functional mobility/transfers, endurance, and safety awareness.  Patient reports being mod I at baseline with use of three wheeled walker and living at home with husband. Pt reports baseline R visual deficits and new L vision changes in the last two weeks. Pt endorses seeing shapes and colors but "no details".  Patient currently functioning at supervision- min guard for self care and mobility. Pt has had multiple falls in several months. BP while seated on EOB is 126/43 (65) and when standing 131/56 (80) and pt remains asymptomatic throughout session. Patient will benefit from acute OT to increase overall independence in the areas of ADLs, functional mobility, and safety awareness in order to safely discharge home with family.      Recommendations for follow up therapy are one component of a multi-disciplinary discharge planning process, led by the attending physician.  Recommendations may be updated based on patient status, additional functional criteria and insurance authorization.   Follow Up Recommendations  No OT follow up     Assistance Recommended at Discharge Intermittent Supervision/Assistance  Patient can return home with the following A little help with bathing/dressing/bathroom;Assistance with cooking/housework;Assist for  transportation    Functional Status Assessment  Patient has had a recent decline in their functional status and demonstrates the ability to make significant improvements in function in a reasonable and predictable amount of time.  Equipment Recommendations  Other (comment) (recommended use of RW at home)       Precautions / Restrictions Precautions Precautions: Fall      Mobility Bed Mobility Overal bed mobility: Needs Assistance Bed Mobility: Supine to Sit, Sit to Supine     Supine to sit: Supervision Sit to supine: Supervision   General bed mobility comments: increased time and cuing for technique and hand placement    Transfers Overall transfer level: Needs assistance Equipment used:  (three wheeled walker) Transfers: Sit to/from Stand Sit to Stand: Min guard                  Balance Overall balance assessment: Needs assistance Sitting-balance support: Feet supported Sitting balance-Leahy Scale: Good     Standing balance support: Reliant on assistive device for balance, Bilateral upper extremity supported Standing balance-Leahy Scale: Fair                             ADL either performed or assessed with clinical judgement   ADL                                         General ADL Comments: supervision overall with use of three wheeled walker     Vision Patient Visual Report: Other (comment) Additional Comments: Pt reports new L eye vision loss described as "not being able to see details" ~ consistent change  for last 2 weeks            Pertinent Vitals/Pain Pain Assessment Pain Assessment: No/denies pain     Hand Dominance Right   Extremity/Trunk Assessment Upper Extremity Assessment Upper Extremity Assessment: Generalized weakness   Lower Extremity Assessment Lower Extremity Assessment: Generalized weakness       Communication Communication Communication: No difficulties   Cognition Arousal/Alertness:  Awake/alert Behavior During Therapy: WFL for tasks assessed/performed Overall Cognitive Status: Within Functional Limits for tasks assessed                                                  Home Living Family/patient expects to be discharged to:: Private residence Living Arrangements: Spouse/significant other Available Help at Discharge: Family;Available 24 hours/day Type of Home: House Home Access: Level entry     Home Layout: One level     Bathroom Shower/Tub: Occupational psychologist: Standard     Home Equipment: Conservation officer, nature (2 wheels);Rollator (4 wheels);Shower seat;Other (comment);Cane - single point;BSC/3in1;Grab bars - tub/shower (3 wheeled walker)          Prior Functioning/Environment Prior Level of Function : Independent/Modified Independent             Mobility Comments: Pt reports mod I with rollator but multiple falls at home over several months ADLs Comments: Pt reports ind with self care tasks and her and husband split IADLs. Husband provides transportation.        OT Problem List: Decreased strength;Decreased activity tolerance;Decreased safety awareness;Impaired balance (sitting and/or standing);Decreased knowledge of use of DME or AE      OT Treatment/Interventions: Self-care/ADL training;Therapeutic exercise;Therapeutic activities;Manual therapy;Balance training;Patient/family education;Energy conservation    OT Goals(Current goals can be found in the care plan section) Acute Rehab OT Goals Patient Stated Goal: to return home OT Goal Formulation: With patient/family Time For Goal Achievement: 02/27/22 Potential to Achieve Goals: Good ADL Goals Pt Will Perform Grooming: with modified independence;standing Pt Will Perform Lower Body Dressing: with modified independence;sit to/from stand Pt Will Transfer to Toilet: with modified independence;ambulating Pt Will Perform Toileting - Clothing Manipulation and hygiene:  with modified independence;sit to/from stand  OT Frequency: Min 2X/week       AM-PAC OT "6 Clicks" Daily Activity     Outcome Measure Help from another person eating meals?: None Help from another person taking care of personal grooming?: None Help from another person toileting, which includes using toliet, bedpan, or urinal?: A Little Help from another person bathing (including washing, rinsing, drying)?: A Little Help from another person to put on and taking off regular upper body clothing?: None Help from another person to put on and taking off regular lower body clothing?: A Little 6 Click Score: 21   End of Session Nurse Communication: Mobility status  Activity Tolerance: Patient tolerated treatment well Patient left: in bed  OT Visit Diagnosis: Repeated falls (R29.6);History of falling (Z91.81);Muscle weakness (generalized) (M62.81)                Time: 1017-5102 OT Time Calculation (min): 21 min Charges:  OT General Charges $OT Visit: 1 Visit OT Evaluation $OT Eval Moderate Complexity: 1 Mod OT Treatments $Therapeutic Activity: 8-22 mins  Darleen Crocker, MS, OTR/L , CBIS ascom [phone]  02/13/22, 2:56 PM

## 2022-02-13 NOTE — Progress Notes (Signed)
PT Cancellation Note  Patient Details Name: [name] MRN: [id number] DOB: [birth year]   Cancelled Treatment:    Reason Eval/Treat Not Completed: Other (comment) (Patient is waiting on lunch and requested for PT to return at a later time. She has been up and walked with OT earlier today, reportedly without difficulty per patient. PT will continue with attempts)  Minna Merritts, PT, MPT  Percell Locus 02/13/2022, 1:24 PM

## 2022-02-13 NOTE — Progress Notes (Signed)
Progress Note    [name]  HUT:[id number] DOB: [birth year]  DOA: 02/12/2022 PCP: Idelle Crouch, MD      Brief Narrative:    Medical records reviewed and are as summarized below:  [name] is a 89 y.o. female with medical history significant for hypertension, hypothyroidism, hyperlipidemia, CAD with severe LAD and RCA disease status post PCI with DES in the right coronary artery in 05/31/2014, and rotoblade on 06/01/14 to LAD, chronic diastolic CHF, history of seizure, idiopathic gout, non-insulin-dependent diabetes mellitus, history of COVID-19 infection.  She presented to the hospital for evaluation of stroke at the behest of her ophthalmologist.  She said she has been having problems with her eyes for some time now.  It started in the right eye about 2 months ago and she has been getting shots in the right eye without any significant improvement.  She developed a problem with the left eye about 2 weeks prior to admission.  She went to her ophthalmologist on the day of admission, and she was told that it may be "a central problem" so she was advised to come to the emergency department for further management.  She has occasional headaches.  She said she has also been passing out within the past 2 weeks.  She normally does not lose consciousness and she is aware of her surroundings when she passes out.  She usually knows when she is about to pass out.  She reports numbness/weakness in the left arm since a fall about 2 months ago.  Otherwise no new numbness or weakness in her extremities.  No changes in speech, she has an occasional headache.       Assessment/Plan:   Principal Problem:   Visual impairment Active Problems:   Stroke-like symptom   Essential hypertension   Diabetes mellitus without complication (HCC)   Seizure (HCC)   CAD (coronary artery disease)   Chronic diastolic CHF (congestive heart failure) (HCC)   Body mass index is 23.62  kg/m.   Visual impairment, fainting spells: She was referred to the emergency department by ophthalmologist because of a central cause of visual impairment was suspected.  No evidence of acute stroke on MRI brain.  Consulted Dr. Quinn Axe, neurologist, to assist with management.  MRI brain with contrast and MRI orbits have been ordered for further evaluation.   History of stroke, CAD with coronary stent, carotid artery stenosis: Continue Plavix and rosuvastatin   Suspected UTI: Doubt UTI at this time.  She is asymptomatic.  No fever leukocytosis.  Discontinue IV ceftriaxone.   Seizure disorder: Continue Keppra  She was recently started on prednisone taper on 02/06/2022 for "pruritic disorder".  She will finish course of prednisone on 02/14/2022.   Other comorbidities include hypothyroidism, gout, chronic diastolic CHF, GERD, hypertension, anxiety      Diet Order             Diet heart healthy/carb modified Room service appropriate? Yes; Fluid consistency: Thin  Diet effective now                            Consultants: Neurologist  Procedures: None    Medications:    allopurinol  100 mg Oral BID   clopidogrel  75 mg Oral Daily   enoxaparin (LOVENOX) injection  40 mg Subcutaneous Q24H   hydrALAZINE  5 mg Intravenous Once   isosorbide mononitrate  30 mg Oral Daily   levETIRAcetam  250 mg Oral BID   levothyroxine  100 mcg Oral Q0600   metoprolol succinate  12.5 mg Oral Daily   predniSONE  10 mg Oral Q breakfast   rosuvastatin  5 mg Oral Daily   Continuous Infusions:     Anti-infectives (From admission, onward)    Start     Dose/Rate Route Frequency Ordered Stop   02/13/22 1500  cefTRIAXone (ROCEPHIN) 1 g in sodium chloride 0.9 % 100 mL IVPB  Status:  Discontinued        1 g 200 mL/hr over 30 Minutes Intravenous Every 24 hours 02/12/22 1811 02/13/22 1558   02/12/22 1515  cefTRIAXone (ROCEPHIN) 1 g in sodium chloride 0.9 % 100 mL IVPB        1 g 200  mL/hr over 30 Minutes Intravenous  Once 02/12/22 1500 02/12/22 1553              Family Communication/Anticipated D/C date and plan/Code Status   DVT prophylaxis: enoxaparin (LOVENOX) injection 40 mg Start: 02/13/22 1000     Code Status: DNR  Family Communication: None Disposition Plan: Plan to discharge home tomorrow   Status is: Observation The patient will require care spanning > 2 midnights and should be moved to inpatient because: MRI brain with contrast and MRI orbits pending       Subjective:   Interval events noted.  No abdominal pain, urinary symptoms.  She still feels her vision is not right.  She can count fingers.  No other complaints.  Objective:    Vitals:   02/13/22 0900 02/13/22 1000 02/13/22 1048 02/13/22 1213  BP: 93/60 131/65 136/60 (!) 126/56  Pulse: 85 73  73  Resp: '12 15  18  '$ Temp:    98.2 F (36.8 C)  TempSrc:      SpO2: 100% 98%  98%  Weight:      Height:       No data found.  No intake or output data in the 24 hours ending 02/13/22 1558 Filed Weights   02/12/22 1153  Weight: 56.7 kg    Exam:  GEN: NAD SKIN: Warm and dry EYES: No pallor or icterus ENT: MMM CV: RRR PULM: CTA B ABD: soft, ND, NT, +BS CNS: AAO x 3, non focal EXT: No edema or tenderness        Data Reviewed:   I have personally reviewed following labs and imaging studies:  Labs: Labs show the following:   Basic Metabolic Panel: Recent Labs  Lab 02/12/22 1430  NA 140  K 4.6  CL 107  CO2 25  GLUCOSE 146*  BUN 24*  CREATININE 1.01*  CALCIUM 8.7*   GFR Estimated Creatinine Clearance: 30.2 mL/min (A) (by C-G formula based on SCr of 1.01 mg/dL (H)). Liver Function Tests: Recent Labs  Lab 02/12/22 1430  AST 29  ALT 17  ALKPHOS 61  BILITOT 0.7  PROT 6.6  ALBUMIN 3.7   No results for input(s): "LIPASE", "AMYLASE" in the last 168 hours. No results for input(s): "AMMONIA" in the last 168 hours. Coagulation profile Recent Labs  Lab  02/12/22 1430  INR 1.0    CBC: Recent Labs  Lab 02/12/22 1219  WBC 7.5  NEUTROABS 6.5  HGB 11.8*  HCT 37.6  MCV 92.2  PLT 172   Cardiac Enzymes: No results for input(s): "CKTOTAL", "CKMB", "CKMBINDEX", "TROPONINI" in the last 168 hours. BNP (last 3 results) No results for input(s): "PROBNP" in the last 8760 hours. CBG: No results for  input(s): "GLUCAP" in the last 168 hours. D-Dimer: No results for input(s): "DDIMER" in the last 72 hours. Hgb A1c: Recent Labs    02/12/22 1225  HGBA1C 5.9*   Lipid Profile: Recent Labs    02/13/22 0552  CHOL 195  HDL 65  LDLCALC 84  TRIG 229*  CHOLHDL 3.0   Thyroid function studies: Recent Labs    02/12/22 1500  TSH 0.402   Anemia work up: No results for input(s): "VITAMINB12", "FOLATE", "FERRITIN", "TIBC", "IRON", "RETICCTPCT" in the last 72 hours. Sepsis Labs: Recent Labs  Lab 02/12/22 1219  WBC 7.5    Microbiology No results found for this or any previous visit (from the past 240 hour(s)).  Procedures and diagnostic studies:  ECHOCARDIOGRAM COMPLETE  Result Date: 02/13/2022    ECHOCARDIOGRAM REPORT   Patient Name:   [name] Date of Exam: 02/13/2022 Medical Rec #:  [id number]             Height:       61.0 in Accession #:    [id number]            Weight:       125.0 lb Date of Birth:  Feb 01, 1933             BSA:          1.547 m Patient Age:    70 years              BP:           159/63 mmHg Patient Gender: F                     HR:           73 bpm. Exam Location:  [facility] Procedure: 2D Echo, Cardiac Doppler and Color Doppler Indications:     Stroke I63.9  History:         Patient has prior history of Echocardiogram examinations, most                  recent 06/07/2014. CAD, Stroke; Risk Factors:Hypertension and                  Diabetes.  Sonographer:     Sherrie Sport Referring Phys:  Morrison Diagnosing Phys: Ida Rogue MD  Sonographer Comments: Image quality was good. IMPRESSIONS  1. Left  ventricular ejection fraction, by estimation, is 60 to 65%. The left ventricle has normal function. The left ventricle has no regional wall motion abnormalities. There is mild left ventricular hypertrophy. Left ventricular diastolic parameters are consistent with Grade I diastolic dysfunction (impaired relaxation).  2. Right ventricular systolic function is normal. The right ventricular size is normal. There is normal pulmonary artery systolic pressure. The estimated right ventricular systolic pressure is 82.9 mmHg.  3. The mitral valve is normal in structure. No evidence of mitral valve regurgitation. No evidence of mitral stenosis.  4. The aortic valve is normal in structure. Aortic valve regurgitation is not visualized. No aortic stenosis is present.  5. The inferior vena cava is normal in size with greater than 50% respiratory variability, suggesting right atrial pressure of 3 mmHg. FINDINGS  Left Ventricle: Left ventricular ejection fraction, by estimation, is 60 to 65%. The left ventricle has normal function. The left ventricle has no regional wall motion abnormalities. The left ventricular internal cavity size was normal in size. There is  mild left ventricular hypertrophy. Left ventricular diastolic parameters are consistent with Grade  I diastolic dysfunction (impaired relaxation). Right Ventricle: The right ventricular size is normal. No increase in right ventricular wall thickness. Right ventricular systolic function is normal. There is normal pulmonary artery systolic pressure. The tricuspid regurgitant velocity is 2.57 m/s, and  with an assumed right atrial pressure of 5 mmHg, the estimated right ventricular systolic pressure is 02.5 mmHg. Left Atrium: Left atrial size was normal in size. Right Atrium: Right atrial size was normal in size. Pericardium: There is no evidence of pericardial effusion. Mitral Valve: The mitral valve is normal in structure. There is mild calcification of the mitral valve  leaflet(s). No evidence of mitral valve regurgitation. No evidence of mitral valve stenosis. Tricuspid Valve: The tricuspid valve is normal in structure. Tricuspid valve regurgitation is mild . No evidence of tricuspid stenosis. Aortic Valve: The aortic valve is normal in structure. Aortic valve regurgitation is not visualized. No aortic stenosis is present. Aortic valve mean gradient measures 5.0 mmHg. Aortic valve peak gradient measures 8.9 mmHg. Aortic valve area, by VTI measures 2.28 cm. Pulmonic Valve: The pulmonic valve was normal in structure. Pulmonic valve regurgitation is not visualized. No evidence of pulmonic stenosis. Aorta: The aortic root is normal in size and structure. Venous: The inferior vena cava is normal in size with greater than 50% respiratory variability, suggesting right atrial pressure of 3 mmHg. IAS/Shunts: No atrial level shunt detected by color flow Doppler.  LEFT VENTRICLE PLAX 2D LVIDd:         4.00 cm   Diastology LVIDs:         2.70 cm   LV e' medial:    5.66 cm/s LV PW:         1.20 cm   LV E/e' medial:  12.8 LV IVS:        1.30 cm   LV e' lateral:   4.68 cm/s LVOT diam:     1.90 cm   LV E/e' lateral: 15.4 LV SV:         64 LV SV Index:   41 LVOT Area:     2.84 cm  RIGHT VENTRICLE RV Basal diam:  2.50 cm RV Mid diam:    2.40 cm LEFT ATRIUM           Index        RIGHT ATRIUM          Index LA diam:      2.40 cm 1.55 cm/m   RA Area:     8.14 cm LA Vol (A2C): 33.7 ml 21.79 ml/m  RA Volume:   13.70 ml 8.86 ml/m LA Vol (A4C): 13.4 ml 8.66 ml/m  AORTIC VALVE AV Area (Vmax):    2.09 cm AV Area (Vmean):   2.09 cm AV Area (VTI):     2.28 cm AV Vmax:           149.00 cm/s AV Vmean:          108.000 cm/s AV VTI:            0.280 m AV Peak Grad:      8.9 mmHg AV Mean Grad:      5.0 mmHg LVOT Vmax:         110.00 cm/s LVOT Vmean:        79.700 cm/s LVOT VTI:          0.225 m LVOT/AV VTI ratio: 0.80  AORTA Ao Root diam: 2.70 cm MITRAL VALVE  TRICUSPID VALVE MV Area  (PHT): 3.36 cm     TR Peak grad:   26.4 mmHg MV Decel Time: 226 msec     TR Vmax:        257.00 cm/s MV E velocity: 72.30 cm/s MV A velocity: 105.00 cm/s  SHUNTS MV E/A ratio:  0.69         Systemic VTI:  0.22 m                             Systemic Diam: 1.90 cm Ida Rogue MD Electronically signed by Ida Rogue MD Signature Date/Time: 02/13/2022/2:38:26 PM    Final    MR BRAIN WO CONTRAST  Result Date: 02/13/2022 CLINICAL DATA:  Acute neurologic deficit EXAM: MRI HEAD WITHOUT CONTRAST TECHNIQUE: Multiplanar, multiecho pulse sequences of the brain and surrounding structures were obtained without intravenous contrast. COMPARISON:  02/12/2021 FINDINGS: Brain: No acute infarct, mass effect or extra-axial collection. No chronic microhemorrhage or siderosis. There is multifocal hyperintense T2-weighted signal within the white matter. Generalized volume loss. The midline structures are normal. 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 Vascular: Normal flow voids. Skull and upper cervical spine: Normal. Sinuses/Orbits: Paranasal sinuses are clear. No mastoid effusion. Normal orbits. Other: None IMPRESSION: 1. No acute intracranial abnormality. 2. 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 Electronically Signed   By: Ulyses Jarred M.D.   On: 02/13/2022 00:25   CT Angio Head Neck W WO CM  Result Date: 02/12/2022 CLINICAL DATA:  Neuro deficit, acute, stroke suspected. Left visual changes over the last 2 weeks. EXAM: CT HEAD WITHOUT CONTRAST CT ANGIOGRAPHY OF THE HEAD AND NECK TECHNIQUE: Contiguous axial images were obtained from the base of the skull through the vertex without intravenous contrast. Multidetector CT imaging of the head and neck was performed using the standard protocol during bolus administration of intravenous contrast. Multiplanar CT image reconstructions and MIPs were obtained to evaluate the vascular anatomy. Carotid stenosis measurements (when  applicable) are obtained utilizing NASCET criteria, using the distal internal carotid diameter as the denominator. RADIATION DOSE REDUCTION: This exam was performed according to the departmental dose-optimization program which includes automated exposure control, adjustment of the mA and/or kV according to patient size and/or use of iterative reconstruction technique. CONTRAST:  85m OMNIPAQUE IOHEXOL 350 MG/ML SOLN COMPARISON:  Brain MRI 02/12/2021. FINDINGS: CT HEAD Brain: No acute intracranial hemorrhage. Patchy and confluence hypoattenuation of the cerebral white matter, consistent with chronic small-vessel disease. Gray-white differentiation is otherwise preserved. No mass effect or midline shift. No extra-axial collection or hydrocephalus. Basilar cisterns are patent. Vascular: No hyperdense vessel or unexpected calcification. Skull: No calvarial fracture or suspicious bone lesion. Severe degenerative changes of the left temporomandibular joint. Moderate degenerative changes of the right TMJ. Sinuses/Orbits: Paranasal sinuses, mastoid air cells, and middle ear cavities are well aerated. Orbits are unremarkable. CTA NECK Aortic arch: Three-vessel arch configuration. Atherosclerotic calcifications of the aortic arch and arch vessel origins. Arch vessel origins are patent. Right carotid system: Tortuous proximal common carotid. Calcified plaque along the carotid bulb and proximal right ICA without hemodynamically significant stenosis. Left carotid system: Tortuous left common carotid. Calcified plaque along the carotid bulb and proximal left ICA without hemodynamically significant stenosis. Vertebral arteries:Patent from the origin to the confluence with the basilar without stenosis or dissection. Skeleton: Type 2 dens fracture with 4 mm of anterior displacement (sagittal image 108 series 15). Sclerosis along the fracture  margins suggests some degree of chronicity. Moderate surrounding pannus results in mild  spinal canal stenosis at this level. Degenerative anterolisthesis of C4 on C5. Multilevel degenerative changes result in at least mild spinal canal stenosis at C4-5 and C6-7. No suspicious bone lesions. Other neck: Unremarkable. CTA HEAD Anterior circulation: Dense atherosclerotic calcifications of the carotid siphons without hemodynamically significant stenosis. The proximal ACAs and MCAs are patent without stenosis or aneurysm. Distal branches are symmetric. Posterior circulation: Diminutive basilar artery. The SCAs, AICAs and PICAs are patent proximally. The PCAs are patent proximally without stenosis or aneurysm. Distal branches are symmetric. Venous sinuses: Patent. Anatomic variants: Persistent fetal origin of both PCAs with left-greater-than-right hypoplasia of the P1 segments. IMPRESSION: 1. No acute intracranial abnormality. 2. No large vessel occlusion or hemodynamically significant stenosis of the head or neck vessels. 3. Subacute to chronic type 2 dens fracture with 4 mm of anterior displacement. Moderate surrounding pannus results in mild spinal canal stenosis at this level. No evidence of vertebral artery dissection. 4. Aortic Atherosclerosis (ICD10-I70.0). Electronically Signed   By: Emmit Alexanders M.D.   On: 02/12/2022 17:09               LOS: 0 days   [name]  [facility]   Pager on [email or web address]. If 7PM-7AM, please contact night-coverage at [email or web address]     02/13/2022, 3:58 PM

## 2022-02-13 NOTE — Progress Notes (Signed)
SLP Cancellation Note  Patient Details Name: [name] MRN: [id number] DOB: May 13, 1933   Cancelled treatment:       Reason Eval/Treat Not Completed: SLP screened, no needs identified, will sign off (chart reviewed; consulted NSG and MD then met w/ pt in room.)  Pt denied any difficulty swallowing and is currently on a regular diet; tolerates swallowing pills w/ water per NSG. Pt was eating her breakfast meal w/ no overt difficulties noted. Pt conversed in conversation w/out expressive/receptive deficits noted; pt denied any speech-language deficits. Speech clear. She made her wants/needs known (finding her phone/belongings, more hot coffee) to this SLP who obliged in setting of vision deficits.   No further skilled ST services indicated as pt appears at his baseline. Pt agreed. NSG to reconsult if any change in status while admitted.     Orinda Kenner, MS, CCC-SLP Speech Language Pathologist Rehab Services; [place] [phone] ([email or web address]) Charmine Bockrath 02/13/2022, 9:20 AM

## 2022-02-13 NOTE — Progress Notes (Signed)
*  PRELIMINARY RESULTS* Echocardiogram 2D Echocardiogram has been performed.  Sherrie Sport 02/13/2022, 8:27 AM

## 2022-02-13 NOTE — Progress Notes (Signed)
Patient left unit in bed en route to MRI. All attachement removed.Taken by transport

## 2022-02-14 DIAGNOSIS — H539 Unspecified visual disturbance: Secondary | ICD-10-CM | POA: Diagnosis present

## 2022-02-14 DIAGNOSIS — F419 Anxiety disorder, unspecified: Secondary | ICD-10-CM | POA: Diagnosis present

## 2022-02-14 DIAGNOSIS — E785 Hyperlipidemia, unspecified: Secondary | ICD-10-CM | POA: Diagnosis present

## 2022-02-14 DIAGNOSIS — I2510 Atherosclerotic heart disease of native coronary artery without angina pectoris: Secondary | ICD-10-CM | POA: Diagnosis present

## 2022-02-14 DIAGNOSIS — Z885 Allergy status to narcotic agent status: Secondary | ICD-10-CM | POA: Diagnosis not present

## 2022-02-14 DIAGNOSIS — Z8616 Personal history of COVID-19: Secondary | ICD-10-CM | POA: Diagnosis not present

## 2022-02-14 DIAGNOSIS — Z8673 Personal history of transient ischemic attack (TIA), and cerebral infarction without residual deficits: Secondary | ICD-10-CM | POA: Diagnosis not present

## 2022-02-14 DIAGNOSIS — Z9842 Cataract extraction status, left eye: Secondary | ICD-10-CM | POA: Diagnosis not present

## 2022-02-14 DIAGNOSIS — Z9841 Cataract extraction status, right eye: Secondary | ICD-10-CM | POA: Diagnosis not present

## 2022-02-14 DIAGNOSIS — I110 Hypertensive heart disease with heart failure: Secondary | ICD-10-CM | POA: Diagnosis present

## 2022-02-14 DIAGNOSIS — H547 Unspecified visual loss: Secondary | ICD-10-CM | POA: Diagnosis not present

## 2022-02-14 DIAGNOSIS — I5032 Chronic diastolic (congestive) heart failure: Secondary | ICD-10-CM | POA: Diagnosis present

## 2022-02-14 DIAGNOSIS — Z888 Allergy status to other drugs, medicaments and biological substances status: Secondary | ICD-10-CM | POA: Diagnosis not present

## 2022-02-14 DIAGNOSIS — R7303 Prediabetes: Secondary | ICD-10-CM | POA: Diagnosis present

## 2022-02-14 DIAGNOSIS — H543 Unqualified visual loss, both eyes: Secondary | ICD-10-CM | POA: Diagnosis present

## 2022-02-14 DIAGNOSIS — Z961 Presence of intraocular lens: Secondary | ICD-10-CM | POA: Diagnosis present

## 2022-02-14 DIAGNOSIS — E039 Hypothyroidism, unspecified: Secondary | ICD-10-CM | POA: Diagnosis present

## 2022-02-14 DIAGNOSIS — M069 Rheumatoid arthritis, unspecified: Secondary | ICD-10-CM | POA: Diagnosis present

## 2022-02-14 DIAGNOSIS — Z85850 Personal history of malignant neoplasm of thyroid: Secondary | ICD-10-CM | POA: Diagnosis not present

## 2022-02-14 DIAGNOSIS — Z8249 Family history of ischemic heart disease and other diseases of the circulatory system: Secondary | ICD-10-CM | POA: Diagnosis not present

## 2022-02-14 DIAGNOSIS — Z955 Presence of coronary angioplasty implant and graft: Secondary | ICD-10-CM | POA: Diagnosis not present

## 2022-02-14 DIAGNOSIS — E119 Type 2 diabetes mellitus without complications: Secondary | ICD-10-CM | POA: Diagnosis present

## 2022-02-14 DIAGNOSIS — Z882 Allergy status to sulfonamides status: Secondary | ICD-10-CM | POA: Diagnosis not present

## 2022-02-14 DIAGNOSIS — Z7984 Long term (current) use of oral hypoglycemic drugs: Secondary | ICD-10-CM | POA: Diagnosis not present

## 2022-02-14 DIAGNOSIS — K219 Gastro-esophageal reflux disease without esophagitis: Secondary | ICD-10-CM | POA: Diagnosis present

## 2022-02-14 DIAGNOSIS — G40909 Epilepsy, unspecified, not intractable, without status epilepticus: Secondary | ICD-10-CM | POA: Diagnosis present

## 2022-02-14 DIAGNOSIS — Z66 Do not resuscitate: Secondary | ICD-10-CM | POA: Diagnosis present

## 2022-02-14 NOTE — Consult Note (Signed)
NEUROLOGY CONSULTATION NOTE   Date of service: February 14, 2022 Patient Name: [name] MRN:  [id number] DOB:  03-May-1933 Reason for consult: vision loss bilat Requesting physician: Dr. Jennye Boroughs _ _ _   _ __   _ __ _ _  __ __   _ __   __ _  History of Present Illness   This is a 89 yo woman with hx HTN, hypothyroidism, HL, CAD with severe LAD and RCA disease s/p PCI, diastolic HF, hx seizure, DM2 referred for evaluation for possible stroke by her ophthalmologist. She started having vision loss in her R eye approx 2 mos ago and has been getting shots in that eye without improvement. Approximately 2 weeks ago she also started to have similar sx in her left eye. Sx are described as blurriness and she is not sure what distribution in each individual eye although loss is possibly most pronounced centrally she says. She does not have any field cuts. Both eyes are the same degree of blurriness now. Her ophthalmologist referred her to "rule out central etiology." I do not have those notes available for review. She denies headaches, unintentional weight loss, or jaw claudication.   MRI brain wo contrast showed no acute process, no e/o ischemia on personal review.   ROS   Per HPI: all other systems reviewed and are negative  Past History   I have reviewed the following:  Past Medical History:  Diagnosis Date   CAD (coronary artery disease)    a. lexiscan 08/2013: nl wall motion, no ischemia, EF 50-55%; b. cardiac cath 05/31/2014: mLAD 90%, dLAD 50%, dLCx 60%, 1st Mrg 80%, pRCA 60% s/p PCI/DES, mRCA 1st lesion 70%, mRCA 2nd 95% s/p PCI/long DES to cover entire mRCA, RPLB 40%;  c. 05/2014 Staged PCI of LAD w/ 2.5 x 15 mm Xience Alpine DES.   CAD (coronary artery disease) 02/11/2021   Carotid artery disease (HCC)    Chronic anxiety    Diverticulosis    Esophagitis    GERD (gastroesophageal reflux disease)    History of colon polyps    History of echocardiogram    a. 08/2013: normal  LVSF, nl RVSF, no valvular regurgitation or stenosis    Hyperlipidemia    Hypertension    Hypothyroidism    Mild reactive airways disease    Osteoarthritis    PONV (postoperative nausea and vomiting)    Rheumatoid arthritis ([place])    Stroke ([place])    a. 05/2014 pt c/o garbled speech following cath 5/19. MRI/A 5/26 showed left caudate infarct->conservative mgmt per neuro.   Thyroid cancer ([place])    a. s/p right sided hemi-thyroidectomy; b. planning for radioactive iodine tx; c. followed by Dr. Marcelene Butte   Type 2 diabetes mellitus [facility])    Past Surgical History:  Procedure Laterality Date   ABDOMINAL HYSTERECTOMY     APPENDECTOMY     AUGMENTATION MAMMAPLASTY     BACK SURGERY     BREAST IMPLANT REMOVAL  06/2001   CARDIAC CATHETERIZATION N/A 06/06/2014   Procedure: Coronary/Graft Atherectomy;  Surgeon: Wellington Hampshire, MD;  Location: [place] [facility];  Service: Cardiovascular;  Laterality: N/A;   CARDIAC CATHETERIZATION N/A 05/31/2014   Procedure: Left Heart Cath;  Surgeon: Minna Merritts, MD;  Location: [place] [facility];  Service: Cardiovascular;  Laterality: N/A;   CARDIAC CATHETERIZATION N/A 05/31/2014   Procedure: Coronary Stent Intervention;  Surgeon: Wellington Hampshire, MD;  Location: [place] [facility];  Service: Cardiovascular;  Laterality: N/A;   CARPAL TUNNEL RELEASE     "I've have a total of 7" (06/06/2014)   CATARACT EXTRACTION W/ INTRAOCULAR LENS  IMPLANT, BILATERAL Bilateral    CHOLECYSTECTOMY     CORONARY ANGIOPLASTY WITH STENT PLACEMENT  05/31/2014; 06/06/2014   "2; 1"   GANGLION CYST EXCISION Right    AC joint   LUMBAR LAMINECTOMY  X 6   MENISCUS REPAIR Bilateral    "2 on the right; 1 on the left"   ROTATOR CUFF REPAIR     "I think 2 left, 2 right"   THYROIDECTOMY     TONSILLECTOMY     TUBAL LIGATION     Family History  Problem Relation Age of Onset   Heart disease Sister        stent placed x 3    Social History   Socioeconomic History   Marital status:  Married    Spouse name: Not on file   Number of children: Not on file   Years of education: Not on file   Highest education level: Not on file  Occupational History   Occupation: retired  Tobacco Use   Smoking status: Never   Smokeless tobacco: Never  Vaping Use   Vaping Use: Never used  Substance and Sexual Activity   Alcohol use: No   Drug use: No   Sexual activity: Not Currently  Other Topics Concern   Not on file  Social History Narrative   Not on file   Social Determinants of Health   Financial Resource Strain: Not on file  Food Insecurity: No Food Insecurity (02/13/2022)   Hunger Vital Sign    Worried About Running Out of Food in the Last Year: Never true    Ran Out of Food in the Last Year: Never true  Transportation Needs: No Transportation Needs (02/13/2022)   PRAPARE - Hydrologist (Medical): No    Lack of Transportation (Non-Medical): No  Physical Activity: Not on file  Stress: Not on file  Social Connections: Not on file   Allergies  Allergen Reactions   Ciprofloxacin Diarrhea and Nausea And Vomiting   Codeine Diarrhea, Nausea And Vomiting and Other (See Comments)   Metronidazole Diarrhea, Nausea And Vomiting and Nausea Only   Other Hives and Other (See Comments)    Distress Reaction:  Muscle pain Reaction:  Muscle pain Reaction:  Muscle pain   Petrolatum-Zinc Oxide Hives   Sulfa Antibiotics Other (See Comments) and Hives    Distress Distress Distress Distress Other reaction(s): Not available   Neomycin-Bacitracin Zn-Polymyx Other (See Comments)    Reaction:  Unknown  Reaction:  Unknown  Reaction:  Unknown  Reaction:  Unknown   Bacitracin-Polymyxin B Rash   Celecoxib Itching, Rash and Other (See Comments)   Neomycin Itching   Statins Other (See Comments)    Reaction:  Muscle pain Reaction:  Muscle pain Reaction:  Muscle pain   Vioxx [Rofecoxib] Itching and Rash    Medications   Medications Prior to Admission   Medication Sig Dispense Refill Last Dose   acetaminophen (TYLENOL) 500 MG tablet Take 500-1,000 mg by mouth every 6 (six) hours as needed for mild pain or moderate pain.   unk   albuterol (VENTOLIN HFA) 108 (90 Base) MCG/ACT inhaler Inhale 2 puffs into the lungs every 6 (six) hours as needed for wheezing or shortness of breath. 8 g 0 unk   allopurinol (ZYLOPRIM) 100 MG tablet TAKE 1 TABLET(100 MG) BY MOUTH TWICE  DAILY 60 tablet 3 02/10/2022   clopidogrel (PLAVIX) 75 MG tablet Take 1 tablet (75 mg total) by mouth daily. 90 tablet 3 02/10/2022   Colchicine (MITIGARE) 0.6 MG CAPS Take 0.6 mg by mouth daily as needed. 30 capsule 4 unk   furosemide (LASIX) 20 MG tablet TAKE 1 TABLET BY MOUTH DAILY AS  NEEDED FOR FLUID (TAKE WITH  POTASSIUM) 90 tablet 1 unk   gabapentin (NEURONTIN) 100 MG capsule Take 100-200 mg by mouth at bedtime as needed.   unk   hydrOXYzine (ATARAX) 25 MG tablet Take 25 mg by mouth 3 (three) times daily.   unk   isosorbide mononitrate (IMDUR) 30 MG 24 hr tablet Take 1 tablet (30 mg total) by mouth daily. 90 tablet 3 02/10/2022   levETIRAcetam (KEPPRA) 250 MG tablet Take 1 tablet (250 mg total) by mouth 2 (two) times daily. 60 tablet 1 02/10/2022   levothyroxine (SYNTHROID) 100 MCG tablet Take 100 mcg by mouth daily.   02/10/2022   metoprolol succinate (TOPROL-XL) 25 MG 24 hr tablet Take 0.5 tablets (12.5 mg total) by mouth daily. 45 tablet 3 02/11/2022 at 2100   Multiple Vitamins-Minerals (ICAPS AREDS 2 PO) Take 1 tablet by mouth 2 (two) times daily.   02/10/2022   nitroGLYCERIN (NITROSTAT) 0.4 MG SL tablet Place 1 tablet (0.4 mg total) under the tongue every 5 (five) minutes as needed for chest pain. 25 tablet 3 unk   potassium chloride (KLOR-CON) 10 MEQ tablet TAKE 1 TABLET BY MOUTH DAILY AS  NEEDED FOR NUTRIENT  REPLENISHMENT TAKE WITH  FUROSEMIDE 90 tablet 1 unk   predniSONE (DELTASONE) 10 MG tablet Take 10 mg by mouth daily with breakfast. Take 6 tablets by mouth daily for 2 days,  then 4 tablets for 2 days, 2 tablets for 2 days, then one tablet for 2 days   unk   rosuvastatin (CRESTOR) 5 MG tablet TAKE 1 TABLET(5 MG) BY MOUTH DAILY 90 tablet 3 unk   traZODone (DESYREL) 50 MG tablet Take 0.5 tablets (25 mg total) by mouth at bedtime as needed for sleep. 15 tablet 0 unk   triamcinolone ointment (KENALOG) 0.1 % Apply topically 2 (two) times daily.   unk   benzonatate (TESSALON PERLES) 100 MG capsule Take 1 capsule (100 mg total) by mouth 3 (three) times daily as needed for cough. (Patient not taking: Reported on 02/13/2022) 30 capsule 0 Completed Course   guaiFENesin-dextromethorphan (ROBITUSSIN DM) 100-10 MG/5ML syrup Take 10 mLs by mouth every 4 (four) hours as needed for cough. (Patient not taking: Reported on 02/13/2022) 118 mL 0 Completed Course   levothyroxine (SYNTHROID) 75 MCG tablet Take 75 mcg by mouth daily. (Patient not taking: Reported on 02/13/2022)   Not Taking   ondansetron (ZOFRAN-ODT) 4 MG disintegrating tablet Take 1 tablet (4 mg total) by mouth every 8 (eight) hours as needed for nausea or vomiting. (Patient not taking: Reported on 02/13/2022) 20 tablet 0 Completed Course   rosuvastatin (CRESTOR) 10 MG tablet Take 10 mg by mouth daily. (Patient not taking: Reported on 02/13/2022)   Not Taking      Current Facility-Administered Medications:    acetaminophen (TYLENOL) tablet 650 mg, 650 mg, Oral, Q4H PRN **OR** acetaminophen (TYLENOL) 160 MG/5ML solution 650 mg, 650 mg, Per Tube, Q4H PRN **OR** acetaminophen (TYLENOL) suppository 650 mg, 650 mg, Rectal, Q4H PRN, Jennye Boroughs, MD   allopurinol (ZYLOPRIM) tablet 100 mg, 100 mg, Oral, BID, Jennye Boroughs, MD, 100 mg at 02/13/22 2141   clopidogrel (PLAVIX)  tablet 75 mg, 75 mg, Oral, Daily, Jennye Boroughs, MD, 75 mg at 02/13/22 1047   enoxaparin (LOVENOX) injection 40 mg, 40 mg, Subcutaneous, Q24H, Jennye Boroughs, MD, 40 mg at 02/13/22 1047   hydrALAZINE (APRESOLINE) injection 5 mg, 5 mg, Intravenous, Once, Sharion Settler, NP   hydrALAZINE (APRESOLINE) injection 5 mg, 5 mg, Intravenous, Q6H PRN, Jennye Boroughs, MD, 5 mg at 02/12/22 2348   isosorbide mononitrate (IMDUR) 24 hr tablet 30 mg, 30 mg, Oral, Daily, Jennye Boroughs, MD, 30 mg at 02/13/22 1049   levETIRAcetam (KEPPRA) tablet 250 mg, 250 mg, Oral, BID, Jennye Boroughs, MD, 250 mg at 02/13/22 2141   levothyroxine (SYNTHROID) tablet 100 mcg, 100 mcg, Oral, Q0600, Jennye Boroughs, MD, 100 mcg at 02/14/22 3295   metoprolol succinate (TOPROL-XL) 24 hr tablet 12.5 mg, 12.5 mg, Oral, Daily, Jennye Boroughs, MD, 12.5 mg at 02/13/22 1048   ondansetron (ZOFRAN) injection 4 mg, 4 mg, Intravenous, Q6H PRN, Sharion Settler, NP, 4 mg at 02/13/22 0040   predniSONE (DELTASONE) tablet 10 mg, 10 mg, Oral, Q breakfast, Jennye Boroughs, MD, 10 mg at 02/13/22 1225   rosuvastatin (CRESTOR) tablet 5 mg, 5 mg, Oral, Daily, Jennye Boroughs, MD, 5 mg at 02/13/22 1047   senna-docusate (Senokot-S) tablet 1 tablet, 1 tablet, Oral, QHS PRN, Jennye Boroughs, MD   traZODone (DESYREL) tablet 25 mg, 25 mg, Oral, QHS PRN, Jennye Boroughs, MD, 25 mg at 02/13/22 2141  Vitals   Vitals:   02/13/22 1213 02/13/22 1625 02/13/22 2125 02/14/22 0447  BP: (!) 126/56 (!) 129/52 (!) 162/69 (!) 160/53  Pulse: 73 77 70 63  Resp: '18  16 16  '$ Temp: 98.2 F (36.8 C) 98.3 F (36.8 C) 98.1 F (36.7 C) 97.9 F (36.6 C)  TempSrc:      SpO2: 98% 95% 99% 96%  Weight:      Height:         Body mass index is 23.62 kg/m.  Physical Exam   Physical Exam Gen: A&O x4, NAD HEENT: Atraumatic, normocephalic;mucous membranes moist; oropharynx clear, tongue without atrophy or fasciculations. Neck: Supple, trachea midline. Resp: CTAB, no w/r/r CV: RRR, no m/g/r; nml S1 and S2. 2+ symmetric peripheral pulses. Abd: soft/NT/ND; nabs x 4 quad Extrem: Nml bulk; no cyanosis, clubbing, or edema.  Neuro: *MS: A&O x4. Follows multi-step commands.  *Speech: fluid, nondysarthric, able to name and repeat *CN:     I: Deferred   II,III: PERRLA, VFF by confrontation, optic discs unable to be visualized 2/2 pupillary constriction   III,IV,VI: EOMI w/o nystagmus, no ptosis   V: Sensation intact from V1 to V3 to LT   VII: Eyelid closure was full.  Smile symmetric.   VIII: Hearing intact to voice   IX,X: Voice normal, palate elevates symmetrically    XI: SCM/trap 5/5 bilat   XII: Tongue protrudes midline, no atrophy or fasciculations   *Motor:   Normal bulk.  No tremor, rigidity or bradykinesia. No pronator drift.    Strength: Dlt Bic Tri WrE WrF FgS Gr HF KnF KnE PlF DoF    Left '5 5 5 5 5 5 5 5 5 5 5 5    '$ Right '5 5 5 5 5 5 5 5 5 5 5 5    '$ *Sensory: Intact to light touch, pinprick, temperature vibration throughout. Symmetric. Propioception intact bilat.  No double-simultaneous extinction.  *Coordination:  Finger-to-nose, heel-to-shin, rapid alternating motions were intact. *Reflexes:  2+ and symmetric throughout without clonus; toes down-going bilat *Gait: deferred  Labs   CBC:  Recent Labs  Lab 02/12/22 1219  WBC 7.5  NEUTROABS 6.5  HGB 11.8*  HCT 37.6  MCV 92.2  PLT 938    Basic Metabolic Panel:  Lab Results  Component Value Date   NA 140 02/12/2022   K 4.6 02/12/2022   CO2 25 02/12/2022   GLUCOSE 146 (H) 02/12/2022   BUN 24 (H) 02/12/2022   CREATININE 1.01 (H) 02/12/2022   CALCIUM 8.7 (L) 02/12/2022   GFRNONAA 54 (L) 02/12/2022   GFRAA >60 01/23/2019   Lipid Panel:  Lab Results  Component Value Date   LDLCALC 84 02/13/2022   HgbA1c:  Lab Results  Component Value Date   HGBA1C 5.9 (H) 02/12/2022   Urine Drug Screen:     Component Value Date/Time   LABOPIA NONE DETECTED 02/12/2022 1225   COCAINSCRNUR NONE DETECTED 02/12/2022 1225   LABBENZ NONE DETECTED 02/12/2022 1225   AMPHETMU NONE DETECTED 02/12/2022 1225   THCU NONE DETECTED 02/12/2022 1225   LABBARB NONE DETECTED 02/12/2022 1225    Alcohol Level     Component Value Date/Time   ETH <10 02/12/2022 1430      Impression   This is a 89 yo woman with hx HTN, hypothyroidism, HL, CAD with severe LAD and RCA disease s/p PCI, diastolic HF, hx seizure, DM2 referred for evaluation of R eye vision loss followed 2 months later by L eye vision loss. She has numerous risk factors for ischemic optic neuropathy, although it would be prudent to rule out optic neuritis and GCA which are treatable.   Recommendations   - MRI brain w contrast - MRI orbits wwo contrast - ESR, CRP - Will continue to follow ______________________________________________________________________   Thank you for the opportunity to take part in the care of this patient. If you have any further questions, please contact the neurology consultation attending.  Signed,  [name], MD [facility] [phone]  If 7pm- 7am, please page neurology on call as listed in [place].  **Any copied and pasted documentation in this note was written by me in another application not billed for and pasted by me into this document.

## 2022-02-14 NOTE — Discharge Summary (Addendum)
Physician Discharge Summary   Patient: [name] MRN: [id number] DOB: Oct 17, 1933  Admit date:     02/12/2022  Discharge date: 02/14/22  Discharge Physician: Jennye Boroughs   PCP: Idelle Crouch, MD   Recommendations at discharge:   Follow-up with PCP in 1 week Follow-up with ophthalmologist as soon as possible  Discharge Diagnoses: Principal Problem:   Visual impairment Active Problems:   Stroke-like symptom   Essential hypertension   Diabetes mellitus without complication (HCC)   Seizure (HCC)   CAD (coronary artery disease)   Chronic diastolic CHF (congestive heart failure) ([place])   Prediabetes  Resolved Problems:   * No resolved hospital problems. *  Hospital Course:  [name] is a 89 y.o. female with medical history significant for hypertension, hypothyroidism, hyperlipidemia, CAD with severe LAD and RCA disease status post PCI with DES in the right coronary artery in 05/31/2014, and rotoblade on 06/01/14 to LAD, chronic diastolic CHF, history of seizure, idiopathic gout, non-insulin-dependent diabetes mellitus, history of COVID-19 infection.  She presented to the hospital for evaluation of stroke at the behest of her ophthalmologist.  She said she has been having problems with her eyes for some time now.  It started in the right eye about 2 months ago and she has been getting shots in the right eye without any significant improvement.  She developed a problem with the left eye about 2 weeks prior to admission.  She went to her ophthalmologist on the day of admission, and she was told that it may be "a central problem" so she was advised to come to the emergency department for further management.  She has occasional headaches.  She said she has also been passing out within the past 2 weeks.  She normally does not lose consciousness and she is aware of her surroundings when she passes out.  She usually knows when she is about to pass out.  She reports  numbness/weakness in the left arm since a fall about 2 months ago.  Otherwise no new numbness or weakness in her extremities.  No changes in speech, she has an occasional headache.     She was admitted to the hospital for stroke workup.  MRI brain with and without contrast did not show any acute stroke.  MRI of the orbits with and without contrast did not show any acute abnormality.  CTA head and neck did not show any large vessel occlusion.  ESR and CRP were normal.  No further workup was recommended by neurologist.  Case was discussed with Dr. Quinn Axe, neurologist, and from her standpoint patient is okay for discharge.  Patient had abnormal urinalysis on admission.  She was started on IV ceftriaxone for suspected UTI.  However, patient did not report any urinary symptoms or fever.  She did not have any leukocytosis.  UTI was ruled out and antibiotics was discontinued. Discharge plan discussed with the patient and her husband at the bedside.  She was advised on fall precautions.  She also has been advised not to drive a vehicle.        Consultants: Neurologist Procedures performed: None Disposition: Home Diet recommendation:  Discharge Diet Orders (From admission, onward)     Start     Ordered   02/14/22 0000  Diet - low sodium heart healthy        02/14/22 1025   02/14/22 0000  Diet Carb Modified        02/14/22 1025  Cardiac and Carb modified diet DISCHARGE MEDICATION: Allergies as of 02/14/2022       Reactions   Ciprofloxacin Diarrhea, Nausea And Vomiting   Codeine Diarrhea, Nausea And Vomiting, Other (See Comments)   Metronidazole Diarrhea, Nausea And Vomiting, Nausea Only   Other Hives, Other (See Comments)   Distress Reaction:  Muscle pain Reaction:  Muscle pain Reaction:  Muscle pain   Petrolatum-zinc Oxide Hives   Sulfa Antibiotics Other (See Comments), Hives   Distress Distress Distress Distress Other reaction(s): Not available   Neomycin-bacitracin  Zn-polymyx Other (See Comments)   Reaction:  Unknown  Reaction:  Unknown  Reaction:  Unknown  Reaction:  Unknown   Bacitracin-polymyxin B Rash   Celecoxib Itching, Rash, Other (See Comments)   Neomycin Itching   Statins Other (See Comments)   Reaction:  Muscle pain Reaction:  Muscle pain Reaction:  Muscle pain   Vioxx [rofecoxib] Itching, Rash        Medication List     STOP taking these medications    benzonatate 100 MG capsule Commonly known as: Tessalon Perles   guaiFENesin-dextromethorphan 100-10 MG/5ML syrup Commonly known as: ROBITUSSIN DM   ondansetron 4 MG disintegrating tablet Commonly known as: ZOFRAN-ODT   predniSONE 10 MG tablet Commonly known as: DELTASONE       TAKE these medications    acetaminophen 500 MG tablet Commonly known as: TYLENOL Take 500-1,000 mg by mouth every 6 (six) hours as needed for mild pain or moderate pain.   albuterol 108 (90 Base) MCG/ACT inhaler Commonly known as: VENTOLIN HFA Inhale 2 puffs into the lungs every 6 (six) hours as needed for wheezing or shortness of breath.   allopurinol 100 MG tablet Commonly known as: ZYLOPRIM TAKE 1 TABLET(100 MG) BY MOUTH TWICE DAILY   clopidogrel 75 MG tablet Commonly known as: PLAVIX Take 1 tablet (75 mg total) by mouth daily.   Colchicine 0.6 MG Caps Commonly known as: Mitigare Take 0.6 mg by mouth daily as needed.   furosemide 20 MG tablet Commonly known as: LASIX TAKE 1 TABLET BY MOUTH DAILY AS  NEEDED FOR FLUID (TAKE WITH  POTASSIUM)   gabapentin 100 MG capsule Commonly known as: NEURONTIN Take 100-200 mg by mouth at bedtime as needed.   hydrOXYzine 25 MG tablet Commonly known as: ATARAX Take 25 mg by mouth 3 (three) times daily.   ICAPS AREDS 2 PO Take 1 tablet by mouth 2 (two) times daily.   isosorbide mononitrate 30 MG 24 hr tablet Commonly known as: IMDUR Take 1 tablet (30 mg total) by mouth daily.   levETIRAcetam 250 MG tablet Commonly known as:  KEPPRA Take 1 tablet (250 mg total) by mouth 2 (two) times daily.   levothyroxine 100 MCG tablet Commonly known as: SYNTHROID Take 100 mcg by mouth daily. What changed: Another medication with the same name was removed. Continue taking this medication, and follow the directions you see here.   metoprolol succinate 25 MG 24 hr tablet Commonly known as: TOPROL-XL Take 0.5 tablets (12.5 mg total) by mouth daily.   nitroGLYCERIN 0.4 MG SL tablet Commonly known as: NITROSTAT Place 1 tablet (0.4 mg total) under the tongue every 5 (five) minutes as needed for chest pain.   potassium chloride 10 MEQ tablet Commonly known as: KLOR-CON TAKE 1 TABLET BY MOUTH DAILY AS  NEEDED FOR NUTRIENT  REPLENISHMENT TAKE WITH  FUROSEMIDE   rosuvastatin 5 MG tablet Commonly known as: CRESTOR TAKE 1 TABLET(5 MG) BY MOUTH DAILY What changed: Another  medication with the same name was removed. Continue taking this medication, and follow the directions you see here.   traZODone 50 MG tablet Commonly known as: DESYREL Take 0.5 tablets (25 mg total) by mouth at bedtime as needed for sleep.   triamcinolone ointment 0.1 % Commonly known as: KENALOG Apply topically 2 (two) times daily.        Discharge Exam: Filed Weights   02/12/22 1153  Weight: 56.7 kg   GEN: NAD SKIN: Warm and dry EYES: No pallor or icterus,PERRLA, no visual field cut ENT: MMM CV: RRR PULM: CTA B ABD: soft, ND, NT, +BS CNS: AAO x 3, non focal EXT: No edema or tenderness   Condition at discharge: good  The results of significant diagnostics from this hospitalization (including imaging, microbiology, ancillary and laboratory) are listed below for reference.   Imaging Studies: MR ORBITS W WO CONTRAST  Result Date: 02/13/2022 CLINICAL DATA:  Left eye vision loss EXAM: MRI OF THE ORBITS WITHOUT AND WITH CONTRAST TECHNIQUE: Multiplanar, multi-echo pulse sequences of the orbits and surrounding structures were acquired including  fat saturation techniques, before and after intravenous contrast administration. CONTRAST:  38m GADAVIST GADOBUTROL 1 MMOL/ML IV SOLN COMPARISON:  None Available. FINDINGS: Orbits: No orbital mass or evidence of inflammation. Normal appearance of the globes, optic nerve-sheath complexes, extraocular muscles, orbital fat and lacrimal glands. Visualized sinuses: Clear. Soft tissues: Normal. Limited intracranial: No acute or significant finding. IMPRESSION: Unremarkable MRI of the orbits. Electronically Signed   By: KUlyses JarredM.D.   On: 02/13/2022 21:35   MR BRAIN W CONTRAST  Result Date: 02/13/2022 CLINICAL DATA:  Vision loss EXAM: MRI HEAD WITH CONTRAST TECHNIQUE: Multiplanar, multiecho pulse sequences of the brain and surrounding structures were obtained with intravenous contrast. CONTRAST:  628mGADAVIST GADOBUTROL 1 MMOL/ML IV SOLN COMPARISON:  None Available. FINDINGS: Postcontrast imaging was performed as an adjunct to the 02/12/2022 MRI. There is no abnormal contrast enhancement. IMPRESSION: No abnormal contrast enhancement. Electronically Signed   By: KeUlyses Jarred.D.   On: 02/13/2022 21:33   ECHOCARDIOGRAM COMPLETE  Result Date: 02/13/2022    ECHOCARDIOGRAM REPORT   Patient Name:   BENERIYAH CERCONEHRISTIAN Date of Exam: 02/13/2022 Medical Rec #:  [id number]           Height:       61.0 in Accession #:    [id number]          Weight:       125.0 lb Date of Birth:  9/Apr 26, 1933           BSA:          1.547 m Patient Age:    [age]              BP:           159/63 mmHg Patient Gender: F                     HR:           73 bpm. Exam Location:  [facility] Procedure: 2D Echo, Cardiac Doppler and Color Doppler Indications:     Stroke I63.9  History:         Patient has prior history of Echocardiogram examinations, most                  recent 06/07/2014. CAD, Stroke; Risk Factors:Hypertension and  Diabetes.  Sonographer:     Sherrie Sport Referring Phys:  [place] Diagnosing Phys:  Ida Rogue MD  Sonographer Comments: Image quality was good. IMPRESSIONS  1. Left ventricular ejection fraction, by estimation, is 60 to 65%. The left ventricle has normal function. The left ventricle has no regional wall motion abnormalities. There is mild left ventricular hypertrophy. Left ventricular diastolic parameters are consistent with Grade I diastolic dysfunction (impaired relaxation).  2. Right ventricular systolic function is normal. The right ventricular size is normal. There is normal pulmonary artery systolic pressure. The estimated right ventricular systolic pressure is 70.9 mmHg.  3. The mitral valve is normal in structure. No evidence of mitral valve regurgitation. No evidence of mitral stenosis.  4. The aortic valve is normal in structure. Aortic valve regurgitation is not visualized. No aortic stenosis is present.  5. The inferior vena cava is normal in size with greater than 50% respiratory variability, suggesting right atrial pressure of 3 mmHg. FINDINGS  Left Ventricle: Left ventricular ejection fraction, by estimation, is 60 to 65%. The left ventricle has normal function. The left ventricle has no regional wall motion abnormalities. The left ventricular internal cavity size was normal in size. There is  mild left ventricular hypertrophy. Left ventricular diastolic parameters are consistent with Grade I diastolic dysfunction (impaired relaxation). Right Ventricle: The right ventricular size is normal. No increase in right ventricular wall thickness. Right ventricular systolic function is normal. There is normal pulmonary artery systolic pressure. The tricuspid regurgitant velocity is 2.57 m/s, and  with an assumed right atrial pressure of 5 mmHg, the estimated right ventricular systolic pressure is 62.8 mmHg. Left Atrium: Left atrial size was normal in size. Right Atrium: Right atrial size was normal in size. Pericardium: There is no evidence of pericardial effusion. Mitral Valve: The  mitral valve is normal in structure. There is mild calcification of the mitral valve leaflet(s). No evidence of mitral valve regurgitation. No evidence of mitral valve stenosis. Tricuspid Valve: The tricuspid valve is normal in structure. Tricuspid valve regurgitation is mild . No evidence of tricuspid stenosis. Aortic Valve: The aortic valve is normal in structure. Aortic valve regurgitation is not visualized. No aortic stenosis is present. Aortic valve mean gradient measures 5.0 mmHg. Aortic valve peak gradient measures 8.9 mmHg. Aortic valve area, by VTI measures 2.28 cm. Pulmonic Valve: The pulmonic valve was normal in structure. Pulmonic valve regurgitation is not visualized. No evidence of pulmonic stenosis. Aorta: The aortic root is normal in size and structure. Venous: The inferior vena cava is normal in size with greater than 50% respiratory variability, suggesting right atrial pressure of 3 mmHg. IAS/Shunts: No atrial level shunt detected by color flow Doppler.  LEFT VENTRICLE PLAX 2D LVIDd:         4.00 cm   Diastology LVIDs:         2.70 cm   LV e' medial:    5.66 cm/s LV PW:         1.20 cm   LV E/e' medial:  12.8 LV IVS:        1.30 cm   LV e' lateral:   4.68 cm/s LVOT diam:     1.90 cm   LV E/e' lateral: 15.4 LV SV:         64 LV SV Index:   41 LVOT Area:     2.84 cm  RIGHT VENTRICLE RV Basal diam:  2.50 cm RV Mid diam:    2.40 cm LEFT ATRIUM  Index        RIGHT ATRIUM          Index LA diam:      2.40 cm 1.55 cm/m   RA Area:     8.14 cm LA Vol (A2C): 33.7 ml 21.79 ml/m  RA Volume:   13.70 ml 8.86 ml/m LA Vol (A4C): 13.4 ml 8.66 ml/m  AORTIC VALVE AV Area (Vmax):    2.09 cm AV Area (Vmean):   2.09 cm AV Area (VTI):     2.28 cm AV Vmax:           149.00 cm/s AV Vmean:          108.000 cm/s AV VTI:            0.280 m AV Peak Grad:      8.9 mmHg AV Mean Grad:      5.0 mmHg LVOT Vmax:         110.00 cm/s LVOT Vmean:        79.700 cm/s LVOT VTI:          0.225 m LVOT/AV VTI ratio: 0.80   AORTA Ao Root diam: 2.70 cm MITRAL VALVE                TRICUSPID VALVE MV Area (PHT): 3.36 cm     TR Peak grad:   26.4 mmHg MV Decel Time: 226 msec     TR Vmax:        257.00 cm/s MV E velocity: 72.30 cm/s MV A velocity: 105.00 cm/s  SHUNTS MV E/A ratio:  0.69         Systemic VTI:  0.22 m                             Systemic Diam: 1.90 cm Ida Rogue MD Electronically signed by Ida Rogue MD Signature Date/Time: 02/13/2022/2:38:26 PM    Final    MR BRAIN WO CONTRAST  Result Date: 02/13/2022 CLINICAL DATA:  Acute neurologic deficit EXAM: MRI HEAD WITHOUT CONTRAST TECHNIQUE: Multiplanar, multiecho pulse sequences of the brain and surrounding structures were obtained without intravenous contrast. COMPARISON:  02/12/2021 FINDINGS: Brain: No acute infarct, mass effect or extra-axial collection. No chronic microhemorrhage or siderosis. There is multifocal hyperintense T2-weighted signal within the white matter. Generalized volume loss. The midline structures are normal. 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 Vascular: Normal flow voids. Skull and upper cervical spine: Normal. Sinuses/Orbits: Paranasal sinuses are clear. No mastoid effusion. Normal orbits. Other: None IMPRESSION: 1. No acute intracranial abnormality. 2. 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 Electronically Signed   By: Ulyses Jarred M.D.   On: 02/13/2022 00:25   CT Angio Head Neck W WO CM  Result Date: 02/12/2022 CLINICAL DATA:  Neuro deficit, acute, stroke suspected. Left visual changes over the last 2 weeks. EXAM: CT HEAD WITHOUT CONTRAST CT ANGIOGRAPHY OF THE HEAD AND NECK TECHNIQUE: Contiguous axial images were obtained from the base of the skull through the vertex without intravenous contrast. Multidetector CT imaging of the head and neck was performed using the standard protocol during bolus administration of intravenous contrast. Multiplanar CT image reconstructions and MIPs were  obtained to evaluate the vascular anatomy. Carotid stenosis measurements (when applicable) are obtained utilizing NASCET criteria, using the distal internal carotid diameter as the denominator. RADIATION DOSE REDUCTION: This exam was performed according to the departmental dose-optimization program which includes automated exposure  control, adjustment of the mA and/or kV according to patient size and/or use of iterative reconstruction technique. CONTRAST:  79m OMNIPAQUE IOHEXOL 350 MG/ML SOLN COMPARISON:  Brain MRI 02/12/2021. FINDINGS: CT HEAD Brain: No acute intracranial hemorrhage. Patchy and confluence hypoattenuation of the cerebral white matter, consistent with chronic small-vessel disease. Gray-white differentiation is otherwise preserved. No mass effect or midline shift. No extra-axial collection or hydrocephalus. Basilar cisterns are patent. Vascular: No hyperdense vessel or unexpected calcification. Skull: No calvarial fracture or suspicious bone lesion. Severe degenerative changes of the left temporomandibular joint. Moderate degenerative changes of the right TMJ. Sinuses/Orbits: Paranasal sinuses, mastoid air cells, and middle ear cavities are well aerated. Orbits are unremarkable. CTA NECK Aortic arch: Three-vessel arch configuration. Atherosclerotic calcifications of the aortic arch and arch vessel origins. Arch vessel origins are patent. Right carotid system: Tortuous proximal common carotid. Calcified plaque along the carotid bulb and proximal right ICA without hemodynamically significant stenosis. Left carotid system: Tortuous left common carotid. Calcified plaque along the carotid bulb and proximal left ICA without hemodynamically significant stenosis. Vertebral arteries:Patent from the origin to the confluence with the basilar without stenosis or dissection. Skeleton: Type 2 dens fracture with 4 mm of anterior displacement (sagittal image 108 series 15). Sclerosis along the fracture margins  suggests some degree of chronicity. Moderate surrounding pannus results in mild spinal canal stenosis at this level. Degenerative anterolisthesis of C4 on C5. Multilevel degenerative changes result in at least mild spinal canal stenosis at C4-5 and C6-7. No suspicious bone lesions. Other neck: Unremarkable. CTA HEAD Anterior circulation: Dense atherosclerotic calcifications of the carotid siphons without hemodynamically significant stenosis. The proximal ACAs and MCAs are patent without stenosis or aneurysm. Distal branches are symmetric. Posterior circulation: Diminutive basilar artery. The SCAs, AICAs and PICAs are patent proximally. The PCAs are patent proximally without stenosis or aneurysm. Distal branches are symmetric. Venous sinuses: Patent. Anatomic variants: Persistent fetal origin of both PCAs with left-greater-than-right hypoplasia of the P1 segments. IMPRESSION: 1. No acute intracranial abnormality. 2. No large vessel occlusion or hemodynamically significant stenosis of the head or neck vessels. 3. Subacute to chronic type 2 dens fracture with 4 mm of anterior displacement. Moderate surrounding pannus results in mild spinal canal stenosis at this level. No evidence of vertebral artery dissection. 4. Aortic Atherosclerosis (ICD10-I70.0). Electronically Signed   By: WEmmit AlexandersM.D.   On: 02/12/2022 17:09   DG Chest 2 View  Result Date: 01/17/2022 CLINICAL DATA:  Syncope EXAM: CHEST - 2 VIEW COMPARISON:  Chest radiograph dated 10/13/2021. FINDINGS: The heart size and mediastinal contours are within normal limits. Vascular calcifications are seen in the aortic arch. Both lungs are clear. Fixation hardware in the lumbar spine is partially imaged. IMPRESSION: No active cardiopulmonary disease. Electronically Signed   By: TZerita BoersM.D.   On: 01/17/2022 15:24   CT HEAD WO CONTRAST  Result Date: 01/17/2022 CLINICAL DATA:  Head trauma, moderate-severe EXAM: CT HEAD WITHOUT CONTRAST TECHNIQUE:  Contiguous axial images were obtained from the base of the skull through the vertex without intravenous contrast. RADIATION DOSE REDUCTION: This exam was performed according to the departmental dose-optimization program which includes automated exposure control, adjustment of the mA and/or kV according to patient size and/or use of iterative reconstruction technique. COMPARISON:  02/11/2021 FINDINGS: Brain: There is periventricular white matter decreased attenuation consistent with small vessel ischemic changes. Ventricles, sulci and cisterns are prominent consistent with age related involutional changes. No acute intracranial hemorrhage, mass effect or shift. No hydrocephalus. Vascular:  No hyperdense vessel or unexpected calcification. Skull: Normal. Negative for fracture or focal lesion. Sinuses/Orbits: Mucoperiosteal thickening consistent with mild chronic maxillary, frontal and ethmoid sinusitis. IMPRESSION: Atrophy and chronic small vessel ischemic changes. No acute intracranial process identified. Electronically Signed   By: Sammie Bench M.D.   On: 01/17/2022 14:22    Microbiology: Results for orders placed or performed during the hospital encounter of 02/12/22  Urine Culture (for pregnant, neutropenic or urologic patients or patients with an indwelling urinary catheter)     Status: Abnormal (Preliminary result)   Collection Time: 02/12/22 12:25 PM   Specimen: Urine, Clean Catch  Result Value Ref Range Status   Specimen Description   Final    URINE, CLEAN CATCH Performed at [facility], [address] [place], [place] [address]    Special Requests   Final    NONE Performed at [facility], [address] [place], [place] [address]    Culture >=100,000 COLONIES/mL GRAM NEGATIVE RODS (A)  Final   Report Status PENDING  Incomplete    Labs: CBC: Recent Labs  Lab 02/12/22 1219  WBC 7.5  NEUTROABS 6.5  HGB 11.8*  HCT 37.6  MCV 92.2  PLT 211   Basic Metabolic  Panel: Recent Labs  Lab 02/12/22 1430  NA 140  K 4.6  CL 107  CO2 25  GLUCOSE 146*  BUN 24*  CREATININE 1.01*  CALCIUM 8.7*   Liver Function Tests: Recent Labs  Lab 02/12/22 1430  AST 29  ALT 17  ALKPHOS 61  BILITOT 0.7  PROT 6.6  ALBUMIN 3.7   CBG: No results for input(s): "GLUCAP" in the last 168 hours.  Discharge time spent: greater than 30 minutes.  Signed: Jennye Boroughs, MD [facility] 02/14/2022

## 2022-02-14 NOTE — TOC Transition Note (Signed)
Transition of Care [facility]) - CM/SW Discharge Note   Patient Details  Name: [name] MRN: [id number] Date of Birth: 06-Feb-1933  [facility] CM/SW Contact:  Izola Price, RN Phone Number: 02/14/2022, 12:32 PM   Clinical Narrative: 2/3: Patient in being discharged with recommendations for outpatient therapy. Contacted patient and she is going to see PCP before deciding but will likely go with [facility] outpatient and does have transportation to outpatient therapy when she decides. She is having vision issues that she wants to have her follow up appointments first. Explained to go through her PCP for outpatient orders to be sent to [facility] when they decide she is ready. Felipa Furnace is PCP. Patient had not other issues with discharge plan and acknowledged follow up appointments. Simmie Davies RN CM       Final next level of care: OP Rehab Barriers to Discharge: Barriers Resolved   Patient Goals and CMS Choice      Discharge Placement                         Discharge Plan and Services Additional resources added to the After Visit Summary for                  DME Arranged: N/A DME Agency: NA       HH Arranged: NA HH Agency: NA        Social Determinants of Health (SDOH) Interventions [place]: No Food Insecurity (02/13/2022)  Housing: [place]  (02/13/2022)  Transportation Needs: No Transportation Needs (02/13/2022)  Utilities: Not At Risk (02/13/2022)  Tobacco Use: Low Risk  (02/13/2022)     Readmission Risk Interventions    10/15/2021   11:35 AM  Readmission Risk Prevention Plan  Transportation Screening Complete  PCP or Specialist Appt within 3-5 Days Complete  Palliative Care Screening Not Applicable  Medication Review (RN Care Manager) Complete

## 2022-02-14 NOTE — Progress Notes (Signed)
Patient is being discharged home.  Discharge papers given and explained to patient and spouse. Both verbalized understanding.  Meds and f/u appointments reviewed. No Rx at this time.

## 2022-02-14 NOTE — Evaluation (Signed)
Physical Therapy Evaluation Patient Details Name: [name] MRN: [id number] DOB: Oct 24, 1933 Today's Date: 02/14/2022  History of Present Illness  89 y.o. female    Past medical history of CAD, lipidemia, hypertension, hypothyroid, rheumatoid arthritis, prior stroke, thyroid cancer and diabetes who presents with left visual changes over the last 2 weeks concerning for stroke sent in by ophthalmologist from eye clinic today.  Symptoms started 2 weeks ago and progressively worsening left eye visual changes.  MRI negative.Subacute to chronic cervical dens fx.  Clinical Impression  Pt is a pleasant 89 year old female who was admitted for visual impairment. Per patient, she can see shapes/colors, however everything is dim and blurry. Slightly difficult walking around unfamiliar environment. Pt performs bed mobility with mod I, transfers with cga, and ambulation with cga and 3WW. Pt demonstrates deficits with balance/endurance. Reports history of falls and balance concerns. Would benefit from skilled PT to address above deficits and promote optimal return to PLOF. Currently recommending OP PT for further follow up.     Recommendations for follow up therapy are one component of a multi-disciplinary discharge planning process, led by the attending physician.  Recommendations may be updated based on patient status, additional functional criteria and insurance authorization.  Follow Up Recommendations Outpatient PT      Assistance Recommended at Discharge PRN  Patient can return home with the following  A little help with walking and/or transfers;A little help with bathing/dressing/bathroom;Assist for transportation;Help with stairs or ramp for entrance    Equipment Recommendations None recommended by PT  Recommendations for Other Services       Functional Status Assessment Patient has had a recent decline in their functional status and demonstrates the ability to make significant  improvements in function in a reasonable and predictable amount of time.     Precautions / Restrictions Precautions Precautions: Fall Restrictions Weight Bearing Restrictions: No      Mobility  Bed Mobility Overal bed mobility: Modified Independent Bed Mobility: Supine to Sit     Supine to sit: Modified independent (Device/Increase time)     General bed mobility comments: safe technique with ease of mobility    Transfers Overall transfer level: Needs assistance Equipment used:  (7YP) Transfers: Sit to/from Stand Sit to Stand: Min guard           General transfer comment: cues for pushing from seated surface. ONce standing, upright posture    Ambulation/Gait Ambulation/Gait assistance: Min guard Gait Distance (Feet): 150 Feet Assistive device: Rolling walker (2 wheels) Gait Pattern/deviations: Step-through pattern       General Gait Details: ambulated around hallway with 3WW. Cues for safety and sequencing secondary to low vision.  Stairs            Wheelchair Mobility    Modified Rankin (Stroke Patients Only)       Balance Overall balance assessment: Needs assistance Sitting-balance support: Feet supported Sitting balance-Leahy Scale: Good     Standing balance support: Reliant on assistive device for balance, Bilateral upper extremity supported Standing balance-Leahy Scale: Fair                               Pertinent Vitals/Pain      Home Living Family/patient expects to be discharged to:: Private residence Living Arrangements: Spouse/significant other Available Help at Discharge: Family;Available 24 hours/day Type of Home: House Home Access: Level entry       Home Layout: One level Home Equipment:  Rolling Walker (2 wheels);Rollator (4 wheels);Shower seat;Other (comment);Cane - single point;BSC/3in1;Grab bars - tub/shower Additional Comments: Husband having multiple falls as well and is also currently admitted at [facility]  for weakness and COVID. Pt has been alone at home with family and friends "checking in".    Prior Function Prior Level of Function : Independent/Modified Independent             Mobility Comments: Pt reports mod I with rollator but multiple falls at home over several months ADLs Comments: Pt reports ind with self care tasks and her and husband split IADLs. Husband provides transportation.     Hand Dominance   Dominant Hand: Right    Extremity/Trunk Assessment   Upper Extremity Assessment Upper Extremity Assessment: Overall WFL for tasks assessed    Lower Extremity Assessment Lower Extremity Assessment: Overall WFL for tasks assessed       Communication   Communication: No difficulties  Cognition Arousal/Alertness: Awake/alert Behavior During Therapy: WFL for tasks assessed/performed Overall Cognitive Status: Within Functional Limits for tasks assessed                                          General Comments      Exercises Other Exercises Other Exercises: ambulated to bathroom with cga. Needs assist for transfers to low toilet and donning depends   Assessment/Plan    PT Assessment Patient needs continued PT services  PT Problem List Decreased activity tolerance;Decreased balance       PT Treatment Interventions Gait training;Balance training    PT Goals (Current goals can be found in the Care Plan section)  Acute Rehab PT Goals Patient Stated Goal: to go home PT Goal Formulation: With patient Time For Goal Achievement: 02/28/22 Potential to Achieve Goals: Good    Frequency Min 2X/week     Co-evaluation               AM-PAC PT "6 Clicks" Mobility  Outcome Measure Help needed turning from your back to your side while in a flat bed without using bedrails?: None Help needed moving from lying on your back to sitting on the side of a flat bed without using bedrails?: None Help needed moving to and from a bed to a chair (including a  wheelchair)?: A Little Help needed standing up from a chair using your arms (e.g., wheelchair or bedside chair)?: A Little Help needed to walk in hospital room?: A Little Help needed climbing 3-5 steps with a railing? : A Little 6 Click Score: 20    End of Session   Activity Tolerance: Patient tolerated treatment well Patient left: in chair Nurse Communication: Mobility status PT Visit Diagnosis: Unsteadiness on feet (R26.81);History of falling (Z91.81)    Time: 4562-5638 PT Time Calculation (min) (ACUTE ONLY): 29 min   Charges:   PT Evaluation $PT Eval Low Complexity: 1 Low PT Treatments $Gait Training: 8-22 mins        Greggory Stallion, PT, DPT, GCS [phone]   Evony Rezek 02/14/2022, 9:46 AM

## 2022-02-14 NOTE — Plan of Care (Signed)
  Problem: Coping: Goal: Will verbalize positive feelings about self Outcome: Progressing   Problem: Health Behavior/Discharge Planning: Goal: Ability to manage health-related needs will improve Outcome: Progressing   Problem: Self-Care: Goal: Ability to participate in self-care as condition permits will improve Outcome: Progressing

## 2022-02-15 LAB — URINE CULTURE: Culture: >=100000 — AB

## 2022-02-19 ENCOUNTER — Other Ambulatory Visit: Payer: Self-pay | Admitting: Dermatology

## 2022-02-19 ENCOUNTER — Ambulatory Visit (INDEPENDENT_AMBULATORY_CARE_PROVIDER_SITE_OTHER): Payer: Medicare Other | Admitting: Dermatology

## 2022-02-19 VITALS — BP 131/60 | HR 79

## 2022-02-19 DIAGNOSIS — L509 Urticaria, unspecified: Secondary | ICD-10-CM

## 2022-02-19 DIAGNOSIS — Z79899 Other long term (current) drug therapy: Secondary | ICD-10-CM | POA: Diagnosis not present

## 2022-02-19 DIAGNOSIS — L503 Dermatographic urticaria: Secondary | ICD-10-CM | POA: Diagnosis not present

## 2022-02-19 MED ORDER — MONTELUKAST SODIUM 10 MG PO TABS: 10.0000 mg | ORAL_TABLET | Freq: Every morning | ORAL | 1 refills | Status: DC

## 2022-02-19 NOTE — Progress Notes (Signed)
   New Patient Visit  Subjective  [name] is a 89 y.o. female who presents for the following: Pruritis (Scalp, back, 2-35m Hydroxyzine 271m1 po tid, cerave cr).  New patient referral from Dr. SpDoy Hutching The following portions of the chart were reviewed this encounter and updated as appropriate:   Tobacco  Allergies  Meds  Problems  Med Hx  Surg Hx  Fam Hx     Review of Systems:  No other skin or systemic complaints except as noted in HPI or Assessment and Plan.  Objective  Well appearing patient in no apparent distress; mood and affect are within normal limits.  A focused examination was performed including scalp, back. Relevant physical exam findings are noted in the Assessment and Plan.  Left Upper Back dermatagraphism   Assessment & Plan  Urticaria with dermatographism trunk  Urticaria or hives is a pink to red patchy whelp- like rash of the skin that typically itches and it is the result of histamine release in the skin.   Hives may have multiple causes including stress, medications, infections, and systemic illness.  Sometimes there is a family history of chronic urticaria.   "Physical urticarias" may be caused by heat, sun, cold, vibration.   Insect bites can cause "papular urticaria". It is often difficult to find the cause of generalized hives.  Statistically, 70% of the time a cause of generalized hives is not found.  Sometimes hives can spontaneously resolve. Other times hives can persist and when it does, and no cause is found, and it has been at least 6 weeks since started, it is called "chronic idiopathic urticaria".  Start Claritin 1-3 po qd, 1 po qam, if not improving can increase to 1 po qam, 1 po at noon, if not improving can increase to 1 po qam, 1 po at noon, then 1 po in the afternoon Start Singulair  1082m po qam Decrease Hydroxyzine 18m39m 1 po hs, may make drowsy  Consider Xolair in future if needed.  Labs reviewed from  02/2022.  montelukast (SINGULAIR) 10 MG tablet - Left Upper Back Take 1 tablet (10 mg total) by mouth every morning.  Return in about 1 month (around 03/20/2022) for Urticaria.  I, [name], am acting as scribe for DaviSarina Ser . Documentation: I have reviewed the above documentation for accuracy and completeness, and I agree with the above.  DaviSarina Ser

## 2022-02-19 NOTE — Patient Instructions (Addendum)
Start Claritin 1-3 pills a day, Start out 1 pill in the morning, if not improving can increase to 1 pill in the morning, and 1 pill at noon, if not improving can increase to 1 pill in the morning, 1 pill at noon, then 1 pill in the afternoon Start Singulair '10mg'$  1 pill in the morning Decrease Hydroxyzine '25mg'$  to 1 pill at bedtime, may make drowsy   Continue Cerave cream    Due to recent changes in healthcare laws, you may see results of your pathology and/or laboratory studies on MyChart before the doctors have had a chance to review them. We understand that in some cases there may be results that are confusing or concerning to you. Please understand that not all results are received at the same time and often the doctors may need to interpret multiple results in order to provide you with the best plan of care or course of treatment. Therefore, we ask that you please give [place] 2 business days to thoroughly review all your results before contacting the office for clarification. Should we see a critical lab result, you will be contacted sooner.   If You Need Anything After Your Visit  If you have any questions or concerns for your doctor, please call our main line at [phone] and press option 4 to reach your doctor's medical assistant. If no one answers, please leave a voicemail as directed and we will return your call as soon as possible. Messages left after 4 pm will be answered the following business day.   You may also send [place] a message via [place]. We typically respond to MyChart messages within 1-2 business days.  For prescription refills, please ask your pharmacy to contact our office. Our fax number is [phone].  If you have an urgent issue when the clinic is closed that cannot wait until the next business day, you can page your doctor at the number below.    Please note that while we do our best to be available for urgent issues outside of office hours, we are not available 24/7.    If you have an urgent issue and are unable to reach [place], you may choose to seek medical care at your doctor's office, retail clinic, urgent care center, or emergency room.  If you have a medical emergency, please immediately call 911 or go to the emergency department.  Pager Numbers  - Dr. Nehemiah Massed: [phone]  - Dr. Laurence Ferrari: [phone]  - Dr. Nicole Kindred: [phone]  In the event of inclement weather, please call our main line at [phone] for an update on the status of any delays or closures.  Dermatology Medication Tips: Please keep the boxes that topical medications come in in order to help keep track of the instructions about where and how to use these. Pharmacies typically print the medication instructions only on the boxes and not directly on the medication tubes.   If your medication is too expensive, please contact our office at [phone] option 4 or send [place] a message through [place].   We are unable to tell what your co-pay for medications will be in advance as this is different depending on your insurance coverage. However, we may be able to find a substitute medication at lower cost or fill out paperwork to get insurance to cover a needed medication.   If a prior authorization is required to get your medication covered by your insurance company, please allow [place] 1-2 business days to complete this process.  Drug prices  often vary depending on where the prescription is filled and some pharmacies may offer cheaper prices.  The website www.goodrx.com contains coupons for medications through different pharmacies. The prices here do not account for what the cost may be with help from insurance (it may be cheaper with your insurance), but the website can give you the price if you did not use any insurance.  - You can print the associated coupon and take it with your prescription to the pharmacy.  - You may also stop by our office during regular business hours and pick up a  GoodRx coupon card.  - If you need your prescription sent electronically to a different pharmacy, notify our office through [facility] or by phone at [phone] option 4.     Si Usted Necesita Algo Despus de Su Visita  Tambin puede enviarnos un mensaje a travs de Pharmacist, community. Por lo general respondemos a los mensajes de MyChart en el transcurso de 1 a 2 das hbiles.  Para renovar recetas, por favor pida a su farmacia que se ponga en contacto con nuestra oficina. Harland Dingwall de fax es [place] [phone].  Si tiene un asunto urgente cuando la clnica est cerrada y que no puede esperar hasta el siguiente da hbil, puede llamar/localizar a su doctor(a) al nmero que aparece a continuacin.   Por favor, tenga en cuenta que aunque hacemos todo lo posible para estar disponibles para asuntos urgentes fuera del horario de [place], no estamos disponibles las 24 horas del da, los 7 das de la [place].   Si tiene un problema urgente y no puede comunicarse con nosotros, puede optar por buscar atencin mdica  en el consultorio de su doctor(a), en una clnica privada, en un centro de atencin urgente o en una sala de emergencias.  Si tiene Engineering geologist, por favor llame inmediatamente al 911 o vaya a la sala de emergencias.  Nmeros de bper  - Dr. Nehemiah Massed: [phone]  - Dra. Moye: [phone]  - Dra. Nicole Kindred: [phone]  En caso de inclemencias del [place], por favor llame a Johnsie Kindred principal al [phone] para una actualizacin sobre el [place] de cualquier retraso o cierre.  Consejos para la medicacin en dermatologa: Por favor, guarde las cajas en las que vienen los medicamentos de uso tpico para ayudarle a seguir las instrucciones sobre dnde y cmo usarlos. Las farmacias generalmente imprimen las instrucciones del medicamento slo en las cajas y no directamente en los tubos del [place].   Si su medicamento es muy caro, por favor, pngase en contacto con  Zigmund Daniel llamando al [phone] y presione la opcin 4 o envenos un mensaje a travs de Pharmacist, community.   No podemos decirle cul ser su copago por los medicamentos por adelantado ya que esto es diferente dependiendo de la cobertura de su seguro. Sin embargo, es posible que podamos encontrar un medicamento sustituto a Electrical engineer un formulario para que el seguro cubra el medicamento que se considera necesario.   Si se requiere una autorizacin previa para que su compaa de seguros [place] su medicamento, por favor permtanos de 1 a 2 das hbiles para completar este proceso.  Los precios de los medicamentos varan con frecuencia dependiendo del Environmental consultant de dnde se surte la receta y alguna farmacias pueden ofrecer precios ms baratos.  El sitio web www.goodrx.com tiene cupones para medicamentos de Airline pilot. Los precios aqu no tienen en cuenta lo que podra costar con la ayuda del seguro (puede ser ms barato con  su seguro), pero el sitio web puede darle el precio si no Field seismologist.  - Puede imprimir el cupn correspondiente y llevarlo con su receta a la farmacia.  - Tambin puede pasar por nuestra oficina durante el horario de atencin regular y Charity fundraiser una tarjeta de cupones de GoodRx.  - Si necesita que su receta se enve electrnicamente a una farmacia diferente, informe a nuestra oficina a travs de MyChart de [place] o por telfono llamando al [phone] y presione la opcin 4.

## 2022-03-03 ENCOUNTER — Encounter: Payer: Self-pay | Admitting: Dermatology

## 2022-03-04 ENCOUNTER — Ambulatory Visit (INDEPENDENT_AMBULATORY_CARE_PROVIDER_SITE_OTHER): Payer: Medicare Other | Admitting: Dermatology

## 2022-03-04 DIAGNOSIS — Z7189 Other specified counseling: Secondary | ICD-10-CM

## 2022-03-04 DIAGNOSIS — L509 Urticaria, unspecified: Secondary | ICD-10-CM

## 2022-03-04 DIAGNOSIS — Z79899 Other long term (current) drug therapy: Secondary | ICD-10-CM

## 2022-03-04 DIAGNOSIS — L501 Idiopathic urticaria: Secondary | ICD-10-CM | POA: Diagnosis not present

## 2022-03-04 MED ORDER — OMALIZUMAB 150 MG/ML ~~LOC~~ SOSY
300.0000 mg | PREFILLED_SYRINGE | Freq: Once | SUBCUTANEOUS | Status: AC
  Administered 2022-03-04: 300 mg via SUBCUTANEOUS

## 2022-03-04 NOTE — Progress Notes (Signed)
   Follow-Up Visit   Subjective  [name] is a 89 y.o. female who presents for the following: Follow-up (3 weeks f/u on itchy skin on her scalp and back, treating with otc Claritin and Singulair tablets with a poor response ).  The following portions of the chart were reviewed this encounter and updated as appropriate:   Tobacco  Allergies  Meds  Problems  Med Hx  Surg Hx  Fam Hx     Review of Systems:  No other skin or systemic complaints except as noted in HPI or Assessment and Plan.  Objective  Well appearing patient in no apparent distress; mood and affect are within normal limits.  A focused examination was performed including scalp,back . Relevant physical exam findings are noted in the Assessment and Plan.   Assessment & Plan  Urticaria Chronic idiopathic urticaria Patient had a positive urine culture 02/12/2022 and was treated while she was in the hospital.  Blood pressure: Before Xolair 12:15 pm - 158/63 After Xolair 12:30 pm - 159/69                     12:45 pm - 141/66   Xolair 150 mg/ml injection given (2 injections to bilateral upper arms = 300 mg) today [facility] 50242-215-86 Lot # [id number] Exp 05/13/2022  [place] filled out and faxed today.  omalizumab Arvid Right) prefilled syringe 300 mg  Related Medications montelukast (SINGULAIR) 10 MG tablet TAKE 1 TABLET(10 MG) BY MOUTH EVERY MORNING  At least 45 minutes spent in evaluation and treatment of the patient today.  Return in about 4 weeks (around 04/01/2022) for  Xolair injection.  IMarye Round, CMA, am acting as scribe for Sarina Ser, MD .  Documentation: I have reviewed the above documentation for accuracy and completeness, and I agree with the above.  Sarina Ser, MD

## 2022-03-04 NOTE — Patient Instructions (Signed)
Due to recent changes in healthcare laws, you may see results of your pathology and/or laboratory studies on MyChart before the doctors have had a chance to review them. We understand that in some cases there may be results that are confusing or concerning to you. Please understand that not all results are received at the same time and often the doctors may need to interpret multiple results in order to provide you with the best plan of care or course of treatment. Therefore, we ask that you please give us 2 business days to thoroughly review all your results before contacting the office for clarification. Should we see a critical lab result, you will be contacted sooner.   If You Need Anything After Your Visit  If you have any questions or concerns for your doctor, please call our main line at [phone] and press option 4 to reach your doctor's medical assistant. If no one answers, please leave a voicemail as directed and we will return your call as soon as possible. Messages left after 4 pm will be answered the following business day.   You may also send us a message via MyChart. We typically respond to MyChart messages within 1-2 business days.  For prescription refills, please ask your pharmacy to contact our office. Our fax number is [phone].  If you have an urgent issue when the clinic is closed that cannot wait until the next business day, you can page your doctor at the number below.    Please note that while we do our best to be available for urgent issues outside of office hours, we are not available 24/7.   If you have an urgent issue and are unable to reach us, you may choose to seek medical care at your doctor's office, retail clinic, urgent care center, or emergency room.  If you have a medical emergency, please immediately call 911 or go to the emergency department.  Pager Numbers  - Dr. Kowalski: [phone]  - Dr. Moye: [phone]  - Dr. Stewart:  [phone]  In the event of inclement weather, please call our main line at [phone] for an update on the status of any delays or closures.  Dermatology Medication Tips: Please keep the boxes that topical medications come in in order to help keep track of the instructions about where and how to use these. Pharmacies typically print the medication instructions only on the boxes and not directly on the medication tubes.   If your medication is too expensive, please contact our office at [phone] option 4 or send us a message through MyChart.   We are unable to tell what your co-pay for medications will be in advance as this is different depending on your insurance coverage. However, we may be able to find a substitute medication at lower cost or fill out paperwork to get insurance to cover a needed medication.   If a prior authorization is required to get your medication covered by your insurance company, please allow us 1-2 business days to complete this process.  Drug prices often vary depending on where the prescription is filled and some pharmacies may offer cheaper prices.  The website www.goodrx.com contains coupons for medications through different pharmacies. The prices here do not account for what the cost may be with help from insurance (it may be cheaper with your insurance), but the website can give you the price if you did not use any insurance.  - You can print the associated coupon and take it with   your prescription to the pharmacy.  - You may also stop by our office during regular business hours and pick up a GoodRx coupon card.  - If you need your prescription sent electronically to a different pharmacy, notify our office through [place] MyChart or by phone at [phone] option 4.     Si Usted Necesita Algo Despus de Su Visita  Tambin puede enviarnos un mensaje a travs de MyChart. Por lo general respondemos a los mensajes de MyChart en el transcurso de 1 a 2  das hbiles.  Para renovar recetas, por favor pida a su farmacia que se ponga en contacto con nuestra oficina. Nuestro nmero de fax es el [phone].  Si tiene un asunto urgente cuando la clnica est cerrada y que no puede esperar hasta el siguiente da hbil, puede llamar/localizar a su doctor(a) al nmero que aparece a continuacin.   Por favor, tenga en cuenta que aunque hacemos todo lo posible para estar disponibles para asuntos urgentes fuera del horario de oficina, no estamos disponibles las 24 horas del da, los 7 das de la semana.   Si tiene un problema urgente y no puede comunicarse con nosotros, puede optar por buscar atencin mdica  en el consultorio de su doctor(a), en una clnica privada, en un centro de atencin urgente o en una sala de emergencias.  Si tiene una emergencia mdica, por favor llame inmediatamente al 911 o vaya a la sala de emergencias.  Nmeros de bper  - Dr. Kowalski: [phone]  - Dra. Moye: [phone]  - Dra. Stewart: [phone]  En caso de inclemencias del tiempo, por favor llame a nuestra lnea principal al [phone] para una actualizacin sobre el estado de cualquier retraso o cierre.  Consejos para la medicacin en dermatologa: Por favor, guarde las cajas en las que vienen los medicamentos de uso tpico para ayudarle a seguir las instrucciones sobre dnde y cmo usarlos. Las farmacias generalmente imprimen las instrucciones del medicamento slo en las cajas y no directamente en los tubos del medicamento.   Si su medicamento es muy caro, por favor, pngase en contacto con nuestra oficina llamando al [phone] y presione la opcin 4 o envenos un mensaje a travs de MyChart.   No podemos decirle cul ser su copago por los medicamentos por adelantado ya que esto es diferente dependiendo de la cobertura de su seguro. Sin embargo, es posible que podamos encontrar un medicamento sustituto a menor costo o llenar un formulario para que el  seguro cubra el medicamento que se considera necesario.   Si se requiere una autorizacin previa para que su compaa de seguros cubra su medicamento, por favor permtanos de 1 a 2 das hbiles para completar este proceso.  Los precios de los medicamentos varan con frecuencia dependiendo del lugar de dnde se surte la receta y alguna farmacias pueden ofrecer precios ms baratos.  El sitio web www.goodrx.com tiene cupones para medicamentos de diferentes farmacias. Los precios aqu no tienen en cuenta lo que podra costar con la ayuda del seguro (puede ser ms barato con su seguro), pero el sitio web puede darle el precio si no utiliz ningn seguro.  - Puede imprimir el cupn correspondiente y llevarlo con su receta a la farmacia.  - Tambin puede pasar por nuestra oficina durante el horario de atencin regular y recoger una tarjeta de cupones de GoodRx.  - Si necesita que su receta se enve electrnicamente a una farmacia diferente, informe a nuestra oficina a travs de MyChart de [place]   o por telfono llamando al [phone] y presione la opcin 4.  

## 2022-03-09 ENCOUNTER — Encounter: Payer: Self-pay | Admitting: Dermatology

## 2022-03-25 ENCOUNTER — Ambulatory Visit: Payer: Medicare Other | Admitting: Dermatology

## 2022-03-30 NOTE — Progress Notes (Unsigned)
Cardiology Office Note  Date:  03/31/2022   ID:  [name], [name] 17, 1935, MRN [id number]  PCP:  Idelle Crouch, MD   Chief Complaint  Patient presents with   6 month follow up     Patient c/o itching all the time and is wondering if a medication is causing the itching. Medications reviewed by the patient verbally.     HPI:  [name] is a 89 y/o female with PMH of  coronary artery disease,  cath May 2016 revealing severe LAD and RCA disease,   Percutaneous intervention and drug-eluting stent placement was performed to  the right coronary artery.The LAD was also felt to be amenable for PCI but it was felt that she would require a Rotablator. performed later at [facility] Jun 01, 2014 She presents today for routine follow-up for coronary artery disease  Last seen by myself in clinic September 2023  In hospital covid 10/23, Secondary to debility, fall risk, went to rehab Now back home, feels well Using rolator to get around Prior history of falls  Chronic itching, "24 hours a day", etiology unclear, has seen dermatology  Denies significant chest pain concerning for angina No significant shortness of breath on exertion  Zetia off her list, reports she continues to take Crestor 5 daily  Reports having difficulty cutting her metoprolol in half  Previously reported spilling hot coffee on her lab Evaluated in the emergency room September 2023 for cellulitis several times  Last echocardiogram May 2016 No ischemic work-up since prior cardiac catheterization in 2016  Lab work reviewed A1C 5.9 Total chol 195, LDL 84, total cholesterol up from 151  EKG personally reviewed by myself on todays visit Shows normal sinus rhythm rate 70 bpm nonspecific ST abnormality, no significant change from prior EKG  Past medical history reviewed emergency room September 27, 2017 for chest pain Cardiac enzymes negative, other work-up benign  Carotid 2016 Findings  consistent with 1-39 percent stenosis involving the right internal carotid artery and the left internal carotid artery.  Prior falls x 3 Mechanical, tripped on a mat, often stepping in the wrong place Very deconditioned, terrible gait instability, walks with a cane   Previous problems with gout, uric acid 8.1 On meloxican prn for pain  episode of garbled speech after PCI and MRI/A of the brain was performed and showed an acute, small, nonhemorrhagic infarct of the left caudate with moderate small vessel disease. She did not have any acute neurologic deficits.   Carotid ultrasound was performed and showed bilateral 1-39% internal carotid artery stenosis.    Thyroid nodule found, status post ablation, currently on thyroid supplementation This was performed March 2016  PMH:   has a past medical history of CAD (coronary artery disease), CAD (coronary artery disease) (02/11/2021), Carotid artery disease ([place]), Chronic anxiety, Diverticulosis, Esophagitis, GERD (gastroesophageal reflux disease), History of colon polyps, History of echocardiogram, Hyperlipidemia, Hypertension, Hypothyroidism, Mild reactive airways disease, Osteoarthritis, PONV (postoperative nausea and vomiting), Rheumatoid arthritis ([place]), Stroke ([place]), Thyroid cancer ([place]), and Type 2 diabetes mellitus ([place]).  PSH:    Past Surgical History:  Procedure Laterality Date   ABDOMINAL HYSTERECTOMY     APPENDECTOMY     AUGMENTATION MAMMAPLASTY     BACK SURGERY     BREAST IMPLANT REMOVAL  06/2001   CARDIAC CATHETERIZATION N/A 06/06/2014   Procedure: Coronary/Graft Atherectomy;  Surgeon: Wellington Hampshire, MD;  Location: [place] [facility];  Service: Cardiovascular;  Laterality: N/A;   CARDIAC CATHETERIZATION N/A  05/31/2014   Procedure: Left Heart Cath;  Surgeon: Minna Merritts, MD;  Location: [place] [facility];  Service: Cardiovascular;  Laterality: N/A;   CARDIAC CATHETERIZATION N/A 05/31/2014   Procedure: Coronary Stent  Intervention;  Surgeon: Wellington Hampshire, MD;  Location: [place] [facility];  Service: Cardiovascular;  Laterality: N/A;   CARPAL TUNNEL RELEASE     "I've have a total of 7" (06/06/2014)   CATARACT EXTRACTION W/ INTRAOCULAR LENS  IMPLANT, BILATERAL Bilateral    CHOLECYSTECTOMY     CORONARY ANGIOPLASTY WITH STENT PLACEMENT  05/31/2014; 06/06/2014   "2; 1"   GANGLION CYST EXCISION Right    AC joint   LUMBAR LAMINECTOMY  X 6   MENISCUS REPAIR Bilateral    "2 on the right; 1 on the left"   ROTATOR CUFF REPAIR     "I think 2 left, 2 right"   THYROIDECTOMY     TONSILLECTOMY     TUBAL LIGATION      Current Outpatient Medications  Medication Sig Dispense Refill   acetaminophen (TYLENOL) 500 MG tablet Take 500-1,000 mg by mouth every 6 (six) hours as needed for mild pain or moderate pain.     allopurinol (ZYLOPRIM) 100 MG tablet TAKE 1 TABLET(100 MG) BY MOUTH TWICE DAILY 60 tablet 3   clopidogrel (PLAVIX) 75 MG tablet Take 1 tablet (75 mg total) by mouth daily. 90 tablet 3   Colchicine (MITIGARE) 0.6 MG CAPS Take 0.6 mg by mouth daily as needed. 30 capsule 4   furosemide (LASIX) 20 MG tablet TAKE 1 TABLET BY MOUTH DAILY AS  NEEDED FOR FLUID (TAKE WITH  POTASSIUM) 90 tablet 1   hydrOXYzine (ATARAX) 25 MG tablet Take 25 mg by mouth 3 (three) times daily.     isosorbide mononitrate (IMDUR) 30 MG 24 hr tablet Take 1 tablet (30 mg total) by mouth daily. 90 tablet 3   levETIRAcetam (KEPPRA) 250 MG tablet Take 1 tablet (250 mg total) by mouth 2 (two) times daily. 60 tablet 1   levothyroxine (SYNTHROID) 100 MCG tablet Take 100 mcg by mouth daily.     metoprolol succinate (TOPROL-XL) 25 MG 24 hr tablet Take 0.5 tablets (12.5 mg total) by mouth daily. 45 tablet 3   montelukast (SINGULAIR) 10 MG tablet TAKE 1 TABLET(10 MG) BY MOUTH EVERY MORNING 90 tablet 0   Multiple Vitamins-Minerals (ICAPS AREDS 2 PO) Take 1 tablet by mouth 2 (two) times daily.     nitroGLYCERIN (NITROSTAT) 0.4 MG SL tablet Place 1  tablet (0.4 mg total) under the tongue every 5 (five) minutes as needed for chest pain. 25 tablet 3   potassium chloride (KLOR-CON) 10 MEQ tablet TAKE 1 TABLET BY MOUTH DAILY AS  NEEDED FOR NUTRIENT  REPLENISHMENT TAKE WITH  FUROSEMIDE 90 tablet 1   rosuvastatin (CRESTOR) 5 MG tablet TAKE 1 TABLET(5 MG) BY MOUTH DAILY 90 tablet 3   albuterol (VENTOLIN HFA) 108 (90 Base) MCG/ACT inhaler Inhale 2 puffs into the lungs every 6 (six) hours as needed for wheezing or shortness of breath. (Patient not taking: Reported on 03/31/2022) 8 g 0   gabapentin (NEURONTIN) 100 MG capsule Take 100-200 mg by mouth at bedtime as needed. (Patient not taking: Reported on 03/31/2022)     traZODone (DESYREL) 50 MG tablet Take 0.5 tablets (25 mg total) by mouth at bedtime as needed for sleep. (Patient not taking: Reported on 03/31/2022) 15 tablet 0   triamcinolone ointment (KENALOG) 0.1 % Apply topically 2 (two) times daily. (  Patient not taking: Reported on 03/31/2022)     No current facility-administered medications for this visit.   Allergies:   Ciprofloxacin, Codeine, Metronidazole, Other, Petrolatum-zinc oxide, Sulfa antibiotics, Neomycin-bacitracin zn-polymyx, Bacitracin-polymyxin b, Celecoxib, Neomycin, Statins, and Vioxx [rofecoxib]   Social History:  The patient  reports that she has never smoked. She has never used smokeless tobacco. She reports that she does not drink alcohol and does not use drugs.   Family History:   family history includes Heart disease in her sister.   Review of Systems: Review of Systems  Constitutional: Negative.   HENT: Negative.    Respiratory: Negative.    Cardiovascular: Negative.   Gastrointestinal: Negative.   Musculoskeletal: Negative.   Neurological: Negative.   Psychiatric/Behavioral: Negative.    All other systems reviewed and are negative.  PHYSICAL EXAM: VS:  BP 122/60 (BP Location: Left Arm, Patient Position: Sitting, Cuff Size: Normal)   Ht 5\' 2"  (1.575 m)   Wt 130 lb  2 oz (59 kg)   SpO2 97%   BMI 23.80 kg/m  , BMI Body mass index is 23.8 kg/m. Constitutional:  oriented to person, place, and time. No distress.  HENT:  Head: Grossly normal Eyes:  no discharge. No scleral icterus.  Neck: No JVD, no carotid bruits  Cardiovascular: Regular rate and rhythm, no murmurs appreciated Pulmonary/Chest: Clear to auscultation bilaterally, no wheezes or rails Abdominal: Soft.  no distension.  no tenderness.  Musculoskeletal: Normal range of motion Neurological:  normal muscle tone. Coordination normal. No atrophy Skin: Skin warm and dry Psychiatric: normal affect, pleasant  Recent Labs: 10/15/2021: Magnesium 1.9 02/12/2022: ALT 17; BUN 24; Creatinine, Ser 1.01; Hemoglobin 11.8; Platelets 172; Potassium 4.6; Sodium 140; TSH 0.402    Lipid Panel Lab Results  Component Value Date   CHOL 195 02/13/2022   HDL 65 02/13/2022   LDLCALC 84 02/13/2022   TRIG 229 (H) 02/13/2022      Wt Readings from Last 3 Encounters:  03/31/22 130 lb 2 oz (59 kg)  02/12/22 125 lb (56.7 kg)  01/17/22 125 lb (56.7 kg)       ASSESSMENT AND PLAN:  Near syncope No recent episodes Blood pressure stable, weight trending up  Coronary artery disease involving native coronary artery of native heart without angina pectoris Currently with no symptoms of angina. No further workup at this time. Continue current medication regimen. Continue aspirin, statin, beta-blocker Restart Zetia  Mixed hyperlipidemia Recommend she continue [employer], cholesterol above goal  Essential hypertension Difficulty cutting the metoprolol succinate down to 12.5 daily, recommend she take the whole 25 daily  Falls Several prior falls,  Walking program recommended  Type 2 diabetes mellitus with other circulatory complication, without long-term current use of insulin (HCC)  hemoglobin A1c 6.3 Managed by primary care  Anemia chronic issue Monitored by primary care    Total  encounter time more than 30 minutes  Greater than 50% was spent in counseling and coordination of care with the patient    No orders of the defined types were placed in this encounter.    Signed, Esmond Plants, M.D., Ph.D. 03/31/2022  [place], [place] \

## 2022-03-31 ENCOUNTER — Ambulatory Visit: Payer: Medicare Other | Attending: Cardiovascular Disease | Admitting: Cardiovascular Disease

## 2022-03-31 ENCOUNTER — Encounter: Payer: Self-pay | Admitting: Cardiovascular Disease

## 2022-03-31 VITALS — BP 122/60 | HR 70 | Ht 62.0 in | Wt 130.1 lb

## 2022-03-31 DIAGNOSIS — I25118 Atherosclerotic heart disease of native coronary artery with other forms of angina pectoris: Secondary | ICD-10-CM

## 2022-03-31 DIAGNOSIS — I639 Cerebral infarction, unspecified: Secondary | ICD-10-CM

## 2022-03-31 DIAGNOSIS — I739 Peripheral vascular disease, unspecified: Secondary | ICD-10-CM | POA: Diagnosis not present

## 2022-03-31 DIAGNOSIS — I6523 Occlusion and stenosis of bilateral carotid arteries: Secondary | ICD-10-CM

## 2022-03-31 DIAGNOSIS — I10 Essential (primary) hypertension: Secondary | ICD-10-CM

## 2022-03-31 DIAGNOSIS — E1159 Type 2 diabetes mellitus with other circulatory complications: Secondary | ICD-10-CM | POA: Diagnosis not present

## 2022-03-31 DIAGNOSIS — I2510 Atherosclerotic heart disease of native coronary artery without angina pectoris: Secondary | ICD-10-CM

## 2022-03-31 MED ORDER — FUROSEMIDE 20 MG PO TABS: ORAL_TABLET | ORAL | 1 refills | Status: DC

## 2022-03-31 MED ORDER — HYDROXYZINE HCL 25 MG PO TABS: 25.0000 mg | ORAL_TABLET | Freq: Three times a day (TID) | ORAL | 0 refills | Status: AC

## 2022-03-31 MED ORDER — POTASSIUM CHLORIDE ER 10 MEQ PO TBCR: EXTENDED_RELEASE_TABLET | ORAL | 3 refills | Status: AC

## 2022-03-31 MED ORDER — ROSUVASTATIN CALCIUM 5 MG PO TABS: ORAL_TABLET | ORAL | 3 refills | Status: DC

## 2022-03-31 MED ORDER — METOPROLOL SUCCINATE ER 25 MG PO TB24: 25.0000 mg | ORAL_TABLET | Freq: Every day | ORAL | 3 refills | Status: DC

## 2022-03-31 MED ORDER — ISOSORBIDE MONONITRATE ER 30 MG PO TB24: 30.0000 mg | ORAL_TABLET | Freq: Every day | ORAL | 3 refills | Status: DC

## 2022-03-31 MED ORDER — EZETIMIBE 10 MG PO TABS: 10.0000 mg | ORAL_TABLET | Freq: Every day | ORAL | 3 refills | Status: DC

## 2022-03-31 MED ORDER — CLOPIDOGREL BISULFATE 75 MG PO TABS: 75.0000 mg | ORAL_TABLET | Freq: Every day | ORAL | 3 refills | Status: DC

## 2022-03-31 NOTE — Patient Instructions (Addendum)
Medication Instructions:  Please restart zetia 10 mg daily  Please increase the metoprolol up to 25 mg daily  If you need a refill on your cardiac medications before your next appointment, please call your pharmacy.   Lab work: No new labs needed  Testing/Procedures: No new testing needed  Follow-Up: At [facility], you and your health needs are our priority.  As part of our continuing mission to provide you with exceptional heart care, we have created designated Provider Care Teams.  These Care Teams include your primary Cardiologist (physician) and Advanced Practice Providers (APPs -  Physician Assistants and Nurse Practitioners) who all work together to provide you with the care you need, when you need it.  You will need a follow up appointment in 12 months  Providers on your designated Care Team:   Murray Hodgkins, NP Christell Faith, PA-C Cadence Kathlen Mody, [place]  COVID-19 Vaccine Information can be found at: ShippingScam.co.uk For questions related to vaccine distribution or appointments, please email vaccine@[email or web address] or call [phone].

## 2022-04-17 ENCOUNTER — Telehealth: Payer: Self-pay

## 2022-04-17 NOTE — Telephone Encounter (Signed)
[employer] called needing update on patient's denial for Xolair in order to continue with patient assistance. Patient no showed follow up. Will hold working prescription at this time.   Denial needs to be faxed to [phone] once completed. Phone if any questions: [phone]

## 2022-05-25 ENCOUNTER — Emergency Department
Admission: EM | Admit: 2022-05-25 | Discharge: 2022-05-25 | Disposition: A | Discharge status: Home / Self Care | Payer: Medicare Other | Attending: Emergency Medicine | Admitting: Emergency Medicine

## 2022-05-25 DIAGNOSIS — L299 Pruritus, unspecified: Secondary | ICD-10-CM | POA: Diagnosis present

## 2022-05-25 MED ORDER — DEXAMETHASONE SODIUM PHOSPHATE 10 MG/ML IJ SOLN
10.0000 mg | Freq: Once | INTRAMUSCULAR | Status: AC
  Administered 2022-05-25: 10 mg via INTRAMUSCULAR
  Filled 2022-05-25: qty 1

## 2022-05-25 NOTE — ED Triage Notes (Addendum)
To triage via wheelchair with c/o itching. Pt states has been itching for 6 months and has been seem by several providers for sx, including dermatology. Has been prescribed Hydroxyzine for sx but has had no relief/

## 2022-05-25 NOTE — ED Provider Notes (Signed)
[facility] Provider Note  Patient Contact: 11:21 PM (approximate)   History   Pruritis   HPI  [name] is a 89 y.o. female presents to the emergency department with diffuse pruritus for the past 6 months.  Patient states that she has pruritus of her lower extremities, ears and back.  She states that she has had multiple dermatology appointments and has had reassuring recent lab work.  She states that she has been prescribed hydroxyzine but states that it only relieves her symptoms temporarily.  She is using CeraVe as a moisturizer.  She denies fever and chills and does not have a rash.      Physical Exam   Triage Vital Signs: ED Triage Vitals  Enc Vitals Group     BP 05/25/22 1935 (!) 165/84     Pulse Rate 05/25/22 1935 77     Resp 05/25/22 1935 18     Temp 05/25/22 1935 98.2 F (36.8 C)     Temp Source 05/25/22 1935 Oral     SpO2 05/25/22 1935 99 %     Weight 05/25/22 1936 130 lb (59 kg)     Height 05/25/22 1936 5\' 2"  (1.575 m)     Head Circumference --      Peak Flow --      Pain Score 05/25/22 1936 0     Pain Loc --      Pain Edu? --      Excl. in GC? --     Most recent vital signs: Vitals:   05/25/22 1935 05/25/22 2208  BP: (!) 165/84 (!) 156/75  Pulse: 77 70  Resp: 18 17  Temp: 98.2 F (36.8 C)   SpO2: 99% 99%     General: Alert and in no acute distress. Eyes:  PERRL. EOMI. Head: No acute traumatic findings ENT:      Nose: No congestion/rhinnorhea.      Mouth/Throat: Mucous membranes are moist. Neck: No stridor. No cervical spine tenderness to palpation. Cardiovascular:  Good peripheral perfusion Respiratory: Normal respiratory effort without tachypnea or retractions. Lungs CTAB. Good air entry to the bases with no decreased or absent breath sounds. Gastrointestinal: Bowel sounds 4 quadrants. Soft and nontender to palpation. No guarding or rigidity. No palpable masses. No distention. No CVA  tenderness. Musculoskeletal: Full range of motion to all extremities.  Neurologic:  No gross focal neurologic deficits are appreciated.  Skin: No rash.     ED Results / Procedures / Treatments   Labs (all labs ordered are listed, but only abnormal results are displayed) Labs Reviewed - No data to display      PROCEDURES:  Critical Care performed: No  Procedures   MEDICATIONS ORDERED IN ED: Medications  dexamethasone (DECADRON) injection 10 mg (10 mg Intramuscular Given 05/25/22 2202)     IMPRESSION / MDM / ASSESSMENT AND PLAN / ED COURSE  I reviewed the triage vital signs and the nursing notes.                              Assessment and plan Pruritus 89 year old female presents to the emergency department with diffuse pruritus for the past 6 months.  Patient was hypertensive at triage but vital signs were otherwise reassuring.  On physical exam, patient had no rash.  I did review patient's basic labs from 510 which were largely reassuring.  Patient was given injection of Decadron prior to discharge and was  advised to continue using her hydroxyzine and CeraVe.  Return precautions were given to return with new or worsening symptoms.      FINAL CLINICAL IMPRESSION(S) / ED DIAGNOSES   Final diagnoses:  Pruritus     Rx / DC Orders   ED Discharge Orders     None        Note:  This document was prepared using Dragon voice recognition software and may include unintentional dictation errors.   Pia Mau [place], PA-C 05/25/22 2324    Chesley Noon, MD 05/25/22 [phone]

## 2022-05-25 NOTE — Discharge Instructions (Signed)
Please follow-up with dermatology.

## 2022-06-12 ENCOUNTER — Emergency Department
Admission: EM | Admit: 2022-06-12 | Discharge: 2022-06-12 | Disposition: A | Discharge status: Home / Self Care | Payer: Medicare Other | Attending: Emergency Medicine | Admitting: Emergency Medicine

## 2022-06-12 ENCOUNTER — Other Ambulatory Visit: Payer: Self-pay

## 2022-06-12 DIAGNOSIS — R21 Rash and other nonspecific skin eruption: Secondary | ICD-10-CM | POA: Diagnosis present

## 2022-06-12 DIAGNOSIS — L299 Pruritus, unspecified: Secondary | ICD-10-CM | POA: Diagnosis not present

## 2022-06-12 DIAGNOSIS — I10 Essential (primary) hypertension: Secondary | ICD-10-CM | POA: Insufficient documentation

## 2022-06-12 DIAGNOSIS — E119 Type 2 diabetes mellitus without complications: Secondary | ICD-10-CM | POA: Insufficient documentation

## 2022-06-12 MED ORDER — HYDROCORTISONE 1 % EX CREA: 1.0000 | TOPICAL_CREAM | Freq: Four times a day (QID) | CUTANEOUS | 0 refills | Status: DC

## 2022-06-12 MED ORDER — DEXAMETHASONE SODIUM PHOSPHATE 10 MG/ML IJ SOLN
10.0000 mg | Freq: Once | INTRAMUSCULAR | Status: AC
  Administered 2022-06-12: 10 mg via INTRAMUSCULAR
  Filled 2022-06-12: qty 1

## 2022-06-12 NOTE — Discharge Instructions (Addendum)
Continue using hydroxyzine and dystrophic cream for affected area and relief of itch.  Please follow-up with dermatology.

## 2022-06-12 NOTE — ED Provider Notes (Signed)
[facility] Emergency Department Provider Note     Event Date/Time   First MD Initiated Contact with Patient 06/12/22 1627     (approximate)   History   Rash (Back, and legs x 1 month)   HPI  Lanissa Neuffer is a 89 y.o. female with a history of hypertension, diabetes and rheumatoid arthritis who presents to the ED with a complaint of a rash for a couple of months, but has had a flareup in the last 2 days.  Patient reports she has a diffuse rash on both lower legs, upper back and ears.  Pruritic in nature.  She has had hydroxyzine, Benadryl, and CeraVe cream with minimal relief. She has made multiple dermatologist appointments with no relief in itching.  denies fever and chills. No other complaints at this time.     Physical Exam   Triage Vital Signs: ED Triage Vitals  Enc Vitals Group     BP 06/12/22 1552 (!) 152/65     Pulse Rate 06/12/22 1550 72     Resp 06/12/22 1550 16     Temp 06/12/22 1550 98.2 F (36.8 C)     Temp Source 06/12/22 1550 Oral     SpO2 06/12/22 1550 98 %     Weight 06/12/22 1551 130 lb 1.1 oz (59 kg)     Height 06/12/22 1551 5\' 2"  (1.575 m)     Head Circumference --      Peak Flow --      Pain Score 06/12/22 1551 2     Pain Loc --      Pain Edu? --      Excl. in GC? --     Most recent vital signs: Vitals:   06/12/22 1550 06/12/22 1552  BP:  (!) 152/65  Pulse: 72   Resp: 16   Temp: 98.2 F (36.8 C)   SpO2: 98%     General Awake, no distress.  Well-appearing. HEENT NCAT. PERRL. EOMI. No rhinorrhea. Mucous membranes are moist.  EACs clear with no noted rash or erythema. CV:  Good peripheral perfusion.  RESP:  Normal effort.  ABD:  No distention.  Other:  Skin is intact, clean and dry diffusely over entire back. No erythema, mass, or lesions. Bilateral LE reveals large ecchymosis with defined borders, skin is intact. No edema. Single erythremic macule on R lateral LE.  Nontender to palpation.   ED Results  / Procedures / Treatments   Labs (all labs ordered are listed, but only abnormal results are displayed) Labs Reviewed - No data to display  No results found.   PROCEDURES:  Critical Care performed: No  Procedures   MEDICATIONS ORDERED IN ED: Medications  dexamethasone (DECADRON) injection 10 mg (10 mg Intramuscular Given 06/12/22 1802)     IMPRESSION / MDM / ASSESSMENT AND PLAN / ED COURSE  I reviewed the triage vital signs and the nursing notes.                              Patient's presentation is most consistent with acute, uncomplicated illness.  89 y.o. female presents to the emergency department for evaluation and treatment of a chronic rash-like skin condition on her lower extremities bilaterally, back, and ears for over a year with persistent flare ups.  Initial vital signs are stable with the exception of a mildly elevated BP of 152/65. See HPI for further details.   Patient primary  diagnosis is consistent with skin pruritus.  Patient desires steroid injection stating its the only relief to her current symptoms of itching.  I educated patient on persistent use of steroid use and the long term affects it may cause especially due to her age. Patient verbalized understanding.  Patient will receive Decadron injection upon discharge and directed to continue hydroxyzine and CeraVe cream as needed.  We discussed possible follow-up with rheumatology if symptoms do not resolve some time, and that this could possibly be an immune issue especially given her history of rheumatoid arthritis.  Patient is in satisfactory and stable condition for discharge and outpatient follow up. Patient will be discharged home with prescriptions for hydrocortisone to apply to affected area for relief of itch. Patient is to follow up with her dermatologist as needed or otherwise directed. Patient is given ED precautions to return to the ED for any worsening or new symptoms. Patient verbalizes  understanding and agrees with assessment and plan.      FINAL CLINICAL IMPRESSION(S) / ED DIAGNOSES   Final diagnoses:  Pruritus of skin     Rx / DC Orders   ED Discharge Orders          Ordered    hydrocortisone cream 1 %  4 times daily        06/12/22 1731             Note:  This document was prepared using Dragon voice recognition software and may include unintentional dictation errors.    Romeo Apple, Roverto Bodmer A, PA-C 06/12/22 1940    Dionne Bucy, MD 06/12/22 1944

## 2022-06-12 NOTE — ED Notes (Signed)
Verified with pharmacist that patient's allergies and medication ordered are compatible.

## 2022-06-12 NOTE — ED Triage Notes (Signed)
Pt to ed from home for rash and itching on both legs and on her back. Pt advised she has been and seen by the dermatologist for the same and has received two injections with no relief. Pt is caox4, in no acute distress in triage.

## 2022-07-31 ENCOUNTER — Telehealth: Payer: Self-pay | Admitting: Cardiovascular Disease

## 2022-07-31 NOTE — Telephone Encounter (Signed)
Attempted to reach the patient and husband. Was unable to leave a message

## 2022-07-31 NOTE — Telephone Encounter (Signed)
Pt c/o medication issue:  1. Name of Medication: Ezetimbe  2. How are you currently taking this medication (dosage and times per day)? 10 mg 1x day  3. Are you having a reaction (difficulty breathing--STAT)? no  4. What is your medication issue? Unable to clarify, no symptoms at this time   Document placed in [facility] nurse box.

## 2022-08-06 NOTE — Telephone Encounter (Signed)
Attempted to contact patient to discuss what was needed for the paperwork, and if any assistance was needed with the medication or questions that she may have. Unable to reach patient or leave a voicemail.

## 2022-08-06 NOTE — Telephone Encounter (Signed)
Patient returned call, stating that she had stopped taking her zetia, she states she received all the side effects and states since stopping it, she has felt better and wanted to make Dr.Gollan aware.  Advised I would route to him as fyi. Thanks!

## 2022-08-10 MED ORDER — REPATHA SURECLICK 140 MG/ML ~~LOC~~ SOAJ: 140.0000 mg | SUBCUTANEOUS | 3 refills | Status: AC

## 2022-08-10 NOTE — Telephone Encounter (Signed)
Called patient. Unable to leave message this time, voice mail box is not set up.

## 2022-08-10 NOTE — Telephone Encounter (Signed)
Spoke with patient and informed her of the following recommendation from Dr. Mariah Milling.  Cholesterol was running high on last check without the Zetia  Does she want to start Repatha 140 subcu every 2 weeks with her Crestor?  Thx  TG   Patient states that she will try Repatha. Prescription sent to preferred pharmacy.

## 2022-08-13 ENCOUNTER — Other Ambulatory Visit (HOSPITAL_COMMUNITY): Payer: Self-pay

## 2022-08-13 ENCOUNTER — Telehealth: Payer: Self-pay

## 2022-08-13 NOTE — Telephone Encounter (Signed)
Pharmacy Patient Advocate Encounter   Received notification from Physician's Office/[place], Erick Colace, RN that prior authorization for REPATHA is required/requested.   Insurance verification completed.   The patient is insured through [employer] .  (Key: [id number])

## 2022-08-14 NOTE — Telephone Encounter (Signed)
Attempted to contact pt to make aware. Unable to leave message as mailbox is not set up

## 2022-08-14 NOTE — Telephone Encounter (Signed)
Pharmacy Patient Advocate Encounter  Received notification from [facility] that Prior Authorization for REPATHA has been APPROVED UNTIL 02/13/23

## 2022-08-20 NOTE — Telephone Encounter (Signed)
Called patient but unable to leave a message. Voice mail box has not been set up.

## 2022-08-26 ENCOUNTER — Encounter: Payer: Self-pay | Admitting: Emergency Medicine

## 2022-08-26 NOTE — Telephone Encounter (Addendum)
Attempted to contact pt to make aware. Unable to leave message as mailbox is not set up. Letter mailed to patient.

## 2022-09-01 ENCOUNTER — Other Ambulatory Visit: Payer: Self-pay | Admitting: Cardiovascular Disease

## 2022-09-01 DIAGNOSIS — I2510 Atherosclerotic heart disease of native coronary artery without angina pectoris: Secondary | ICD-10-CM

## 2022-09-03 ENCOUNTER — Other Ambulatory Visit: Payer: Self-pay | Admitting: Internal Medicine

## 2022-09-03 DIAGNOSIS — Z1231 Encounter for screening mammogram for malignant neoplasm of breast: Secondary | ICD-10-CM

## 2022-09-05 ENCOUNTER — Emergency Department
Admission: EM | Admit: 2022-09-05 | Discharge: 2022-09-05 | Disposition: A | Discharge status: Home / Self Care | Payer: Medicare Other | Attending: Emergency Medicine | Admitting: Emergency Medicine

## 2022-09-05 ENCOUNTER — Other Ambulatory Visit: Payer: Self-pay

## 2022-09-05 ENCOUNTER — Emergency Department: Payer: Medicare Other

## 2022-09-05 DIAGNOSIS — E119 Type 2 diabetes mellitus without complications: Secondary | ICD-10-CM | POA: Insufficient documentation

## 2022-09-05 DIAGNOSIS — M25561 Pain in right knee: Secondary | ICD-10-CM | POA: Diagnosis present

## 2022-09-05 DIAGNOSIS — I10 Essential (primary) hypertension: Secondary | ICD-10-CM | POA: Insufficient documentation

## 2022-09-05 DIAGNOSIS — M25461 Effusion, right knee: Secondary | ICD-10-CM

## 2022-09-05 NOTE — ED Triage Notes (Signed)
Pt advised she has HX of chronic knee pain and HX of gout. She just had her gout medication filled but has not started it as of yet.

## 2022-09-05 NOTE — ED Provider Notes (Signed)
[facility] Provider Note    Event Date/Time   First MD Initiated Contact with Patient 09/05/22 1815     (approximate)   History   Knee Pain (bilateral)   HPI  Dieu Minion is a 89 y.o. female with hx of diabetes, htn, osteoarthritis, and RA presents to the ER with b/l knee pain and right thigh pain, patient states chronic pain in both but got worse over last 2 days.  No known injury, states it feels like she pulled something.  No recent falls.  Had xrays at [facility] but doesn't know results and would like to get an xray of the knee and leg      Physical Exam   Triage Vital Signs: ED Triage Vitals [09/05/22 1652]  Encounter Vitals Group     BP (!) 168/52     Systolic BP Percentile      Diastolic BP Percentile      Pulse Rate 74     Resp 16     Temp 98.2 F (36.8 C)     Temp Source Oral     SpO2 98 %     Weight 133 lb (60.3 kg)     Height 5\' 2"  (1.575 m)     Head Circumference      Peak Flow      Pain Score 8     Pain Loc      Pain Education      Exclude from Growth Chart     Most recent vital signs: Vitals:   09/05/22 1652  BP: (!) 168/52  Pulse: 74  Resp: 16  Temp: 98.2 F (36.8 C)  SpO2: 98%     General: Awake, no distress.   CV:  Good peripheral perfusion. regular rate and  rhythm Resp:  Normal effort. Abd:  No distention.   Other:  Right knee tender, full rom, right hip and thigh minimally tender, n/v intact   ED Results / Procedures / Treatments   Labs (all labs ordered are listed, but only abnormal results are displayed) Labs Reviewed - No data to display   EKG     RADIOLOGY Right knee, right hip    PROCEDURES:   Procedures   MEDICATIONS ORDERED IN ED: Medications - No data to display   IMPRESSION / MDM / ASSESSMENT AND PLAN / ED COURSE  I reviewed the triage vital signs and the nursing notes.                              Differential diagnosis includes, but is not limited to, strain,  fracture, arthritis flare,  Patient's presentation is most consistent with acute complicated illness / injury requiring diagnostic workup.   Xray of right knee and right hip   X-ray of the right knee and right hip independently reviewed interpreted by me, I do see a small effusion noted at the right knee, otherwise there is mostly arthritis noted.  I did explain this to the patient.  Did show her the x-rays.  She is to follow-up with orthopedics.  Placed in a knee immobilizer to help decrease irritation and swelling of the right knee.  She can continue to use her over-the-counter medications for pain.  Return emergency department if worsening.  She is in agreement treatment plan.  Discharged stable condition.   FINAL CLINICAL IMPRESSION(S) / ED DIAGNOSES   Final diagnoses:  Effusion of right knee  Rx / DC Orders   ED Discharge Orders     None        Note:  This document was prepared using Dragon voice recognition software and may include unintentional dictation errors.    Faythe Ghee, PA-C 09/05/22 1933    Dionne Bucy, MD 09/05/22 [phone]

## 2022-09-05 NOTE — ED Triage Notes (Signed)
Pt to ed from home via POV for leg pain. Pt was seen at her PCP [facility] on 8/21 for her yearly. They drew labwork and it all came back normal. She had a few xrays taken and she has not gotten those results. They took xrays due to some knee pain. Pt denies and recent falls or injuries. Pt is caox4, in no acute distress and in a wheel chair in triage.,

## 2022-10-01 ENCOUNTER — Ambulatory Visit
Admission: RE | Admit: 2022-10-01 | Discharge: 2022-10-01 | Disposition: A | Discharge status: Home / Self Care | Payer: Medicare Other | Source: Ambulatory Visit | Attending: Internal Medicine | Admitting: Internal Medicine

## 2022-10-01 DIAGNOSIS — Z1231 Encounter for screening mammogram for malignant neoplasm of breast: Secondary | ICD-10-CM | POA: Diagnosis present

## 2022-10-04 ENCOUNTER — Emergency Department
Admission: EM | Admit: 2022-10-04 | Discharge: 2022-10-04 | Disposition: A | Discharge status: Home / Self Care | Payer: Medicare Other | Attending: Emergency Medicine | Admitting: Emergency Medicine

## 2022-10-04 ENCOUNTER — Other Ambulatory Visit: Payer: Self-pay

## 2022-10-04 DIAGNOSIS — L03019 Cellulitis of unspecified finger: Secondary | ICD-10-CM

## 2022-10-04 DIAGNOSIS — L03011 Cellulitis of right finger: Secondary | ICD-10-CM | POA: Diagnosis present

## 2022-10-04 DIAGNOSIS — Z23 Encounter for immunization: Secondary | ICD-10-CM | POA: Insufficient documentation

## 2022-10-04 DIAGNOSIS — I10 Essential (primary) hypertension: Secondary | ICD-10-CM | POA: Diagnosis not present

## 2022-10-04 DIAGNOSIS — E119 Type 2 diabetes mellitus without complications: Secondary | ICD-10-CM | POA: Insufficient documentation

## 2022-10-04 DIAGNOSIS — W5503XA Scratched by cat, initial encounter: Secondary | ICD-10-CM | POA: Insufficient documentation

## 2022-10-04 MED ORDER — TETANUS-DIPHTH-ACELL PERTUSSIS 5-2.5-18.5 LF-MCG/0.5 IM SUSY
0.5000 mL | PREFILLED_SYRINGE | Freq: Once | INTRAMUSCULAR | Status: AC
  Administered 2022-10-04: 0.5 mL via INTRAMUSCULAR
  Filled 2022-10-04: qty 0.5

## 2022-10-04 MED ORDER — AZITHROMYCIN 250 MG PO TABS: ORAL_TABLET | ORAL | 0 refills | Status: DC

## 2022-10-04 MED ORDER — AMOXICILLIN-POT CLAVULANATE 875-125 MG PO TABS
1.0000 | ORAL_TABLET | Freq: Once | ORAL | Status: AC
  Administered 2022-10-04: 1 via ORAL
  Filled 2022-10-04: qty 1

## 2022-10-04 NOTE — ED Provider Notes (Signed)
[facility] Provider Note    Event Date/Time   First MD Initiated Contact with Patient 10/04/22 1718     (approximate)   History   Animal Bite   HPI  [name] is a 89 y.o. female history of hypertension, type 2 diabetes among other chronic medical problems, see past medical history presents emergency department after a cat scratch to the right middle finger.  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  Sure her last tetanus.  States the cat can be captured      Physical Exam   Triage Vital Signs: ED Triage Vitals  Encounter Vitals Group     BP 10/04/22 1716 (!) 150/58     Systolic BP Percentile --      Diastolic BP Percentile --      Pulse Rate 10/04/22 1716 77     Resp 10/04/22 1716 17     Temp 10/04/22 1716 98 F (36.7 C)     Temp src --      SpO2 10/04/22 1716 99 %     Weight --      Height --      Head Circumference --      Peak Flow --      Pain Score 10/04/22 1715 3     Pain Loc --      Pain Education --      Exclude from Growth Chart --     Most recent vital signs: Vitals:   10/04/22 1716  BP: (!) 150/58  Pulse: 77  Resp: 17  Temp: 98 F (36.7 C)  SpO2: 99%     General: Awake, no distress.   CV:  Good peripheral perfusion. regular rate and  rhythm Resp:  Normal effort.  Abd:  No distention.   Other:  Right middle finger is red and swollen surrounding the scratch area extends to the distal and proximal areas, no pus or drainage, neurovascular intact   ED Results / Procedures / Treatments   Labs (all labs ordered are listed, but only abnormal results are displayed) Labs Reviewed - No data to display   EKG     RADIOLOGY     PROCEDURES:   Procedures   MEDICATIONS ORDERED IN ED: Medications  Tdap (BOOSTRIX) injection 0.5 mL (has no administration in time range)  amoxicillin-clavulanate (AUGMENTIN) 875-125 MG per tablet 1 tablet (has no administration in time range)      IMPRESSION / MDM / ASSESSMENT AND PLAN / ED COURSE  I reviewed the triage vital signs and the nursing notes.                              Differential diagnosis includes, but is not limited to, cat scratch, cellulitis, wound infection  Patient's presentation is most consistent with acute complicated illness / injury requiring diagnostic workup.   Due to the cat scratch we will go ahead and update patient's Tdap.  Animal control notified since it is feral we have had several rabid animals in the area.  Patient was given Augmentin here in the ED.  Zithromax for cat scratch.  Follow-up with her regular doctor if not improving to 3 days.  Return if worsening.  She is in agreement treatment plan.  Discharged stable condition.  Will defer rabies vaccinations as it was a scratch and not a bite, also the cat is known and can be captured.  FINAL CLINICAL IMPRESSION(S) / ED DIAGNOSES   Final diagnoses:  Cat scratch  Cellulitis of finger, unspecified laterality     Rx / DC Orders   ED Discharge Orders          Ordered    azithromycin (ZITHROMAX Z-PAK) 250 MG tablet        10/04/22 1729             Note:  This document was prepared using Dragon voice recognition software and may include unintentional dictation errors.    Faythe Ghee, PA-C 10/04/22 1738    Corena Herter, MD 10/04/22 2016

## 2022-10-04 NOTE — ED Triage Notes (Signed)
Pt comes with c/o cat scratch on Wed. Pt states it got her on her right hand.

## 2022-10-04 NOTE — ED Notes (Signed)
Animal control contacted in regards to the cat scratch. Incident occurred at [address] [place], [place].

## 2022-10-06 ENCOUNTER — Other Ambulatory Visit: Payer: Self-pay | Admitting: Internal Medicine

## 2022-10-06 DIAGNOSIS — N6489 Other specified disorders of breast: Secondary | ICD-10-CM

## 2022-10-06 DIAGNOSIS — R928 Other abnormal and inconclusive findings on diagnostic imaging of breast: Secondary | ICD-10-CM

## 2022-10-12 ENCOUNTER — Ambulatory Visit
Admission: RE | Admit: 2022-10-12 | Discharge: 2022-10-12 | Disposition: A | Discharge status: Home / Self Care | Payer: Medicare Other | Source: Ambulatory Visit | Attending: Internal Medicine | Admitting: Internal Medicine

## 2022-10-12 DIAGNOSIS — N6489 Other specified disorders of breast: Secondary | ICD-10-CM | POA: Diagnosis present

## 2022-10-12 DIAGNOSIS — R928 Other abnormal and inconclusive findings on diagnostic imaging of breast: Secondary | ICD-10-CM | POA: Diagnosis present

## 2022-10-29 ENCOUNTER — Other Ambulatory Visit: Payer: Self-pay | Admitting: Gastroenterology

## 2022-10-29 DIAGNOSIS — R1310 Dysphagia, unspecified: Secondary | ICD-10-CM

## 2022-11-11 ENCOUNTER — Ambulatory Visit: Payer: Medicare Other

## 2022-12-19 ENCOUNTER — Emergency Department
Admission: EM | Admit: 2022-12-19 | Discharge: 2022-12-19 | Disposition: A | Discharge status: Home / Self Care | Payer: Medicare Other | Attending: Emergency Medicine | Admitting: Emergency Medicine

## 2022-12-19 ENCOUNTER — Other Ambulatory Visit: Payer: Self-pay

## 2022-12-19 ENCOUNTER — Encounter: Payer: Self-pay | Admitting: Pharmacy Technician

## 2022-12-19 DIAGNOSIS — Z7901 Long term (current) use of anticoagulants: Secondary | ICD-10-CM | POA: Diagnosis not present

## 2022-12-19 DIAGNOSIS — R531 Weakness: Secondary | ICD-10-CM | POA: Insufficient documentation

## 2022-12-19 DIAGNOSIS — Z5321 Procedure and treatment not carried out due to patient leaving prior to being seen by health care provider: Secondary | ICD-10-CM | POA: Diagnosis not present

## 2022-12-19 LAB — CBC
HCT: 34.4 % — ABNORMAL LOW (ref 36.0–46.0)
Hemoglobin: 11.5 g/dL — ABNORMAL LOW (ref 12.0–15.0)
MCH: 29.3 pg (ref 26.0–34.0)
MCHC: 33.4 g/dL (ref 30.0–36.0)
MCV: 87.8 fL (ref 80.0–100.0)
Platelets: 209 10*3/uL (ref 150–400)
RBC: 3.92 MIL/uL (ref 3.87–5.11)
RDW: 13.2 % (ref 11.5–15.5)
WBC: 7 10*3/uL (ref 4.0–10.5)
nRBC: 0 % (ref 0.0–0.2)

## 2022-12-19 LAB — BASIC METABOLIC PANEL
Anion gap: 8 (ref 5–15)
BUN: 27 mg/dL — ABNORMAL HIGH (ref 8–23)
CO2: 24 mmol/L (ref 22–32)
Calcium: 8.6 mg/dL — ABNORMAL LOW (ref 8.9–10.3)
Chloride: 108 mmol/L (ref 98–111)
Creatinine, Ser: 0.89 mg/dL (ref 0.44–1.00)
GFR, Estimated: >60 mL/min (ref >60)
Glucose, Bld: 144 mg/dL — ABNORMAL HIGH (ref 70–99)
Potassium: 4.4 mmol/L (ref 3.5–5.1)
Sodium: 140 mmol/L (ref 135–145)

## 2022-12-19 NOTE — ED Triage Notes (Signed)
Pt bib ems from [employer] where pt had been shopping. Pt states was standing in line when she became very weak and ended up falling. Pt denies hitting head, does take plavix. 110/70 CBG 138

## 2023-01-10 ENCOUNTER — Other Ambulatory Visit: Payer: Self-pay

## 2023-01-10 ENCOUNTER — Emergency Department: Payer: Medicare Other

## 2023-01-10 DIAGNOSIS — I10 Essential (primary) hypertension: Secondary | ICD-10-CM | POA: Insufficient documentation

## 2023-01-10 DIAGNOSIS — R42 Dizziness and giddiness: Secondary | ICD-10-CM | POA: Diagnosis not present

## 2023-01-10 DIAGNOSIS — Z5321 Procedure and treatment not carried out due to patient leaving prior to being seen by health care provider: Secondary | ICD-10-CM | POA: Diagnosis not present

## 2023-01-10 LAB — BASIC METABOLIC PANEL
Anion gap: 12 (ref 5–15)
BUN: 25 mg/dL — ABNORMAL HIGH (ref 8–23)
CO2: 21 mmol/L — ABNORMAL LOW (ref 22–32)
Calcium: 8.8 mg/dL — ABNORMAL LOW (ref 8.9–10.3)
Chloride: 107 mmol/L (ref 98–111)
Creatinine, Ser: 1.05 mg/dL — ABNORMAL HIGH (ref 0.44–1.00)
GFR, Estimated: 51 mL/min — ABNORMAL LOW (ref >60)
Glucose, Bld: 145 mg/dL — ABNORMAL HIGH (ref 70–99)
Potassium: 4.3 mmol/L (ref 3.5–5.1)
Sodium: 140 mmol/L (ref 135–145)

## 2023-01-10 LAB — CBC
HCT: 36.4 % (ref 36.0–46.0)
Hemoglobin: 11.4 g/dL — ABNORMAL LOW (ref 12.0–15.0)
MCH: 29.1 pg (ref 26.0–34.0)
MCHC: 31.3 g/dL (ref 30.0–36.0)
MCV: 92.9 fL (ref 80.0–100.0)
Platelets: 181 10*3/uL (ref 150–400)
RBC: 3.92 MIL/uL (ref 3.87–5.11)
RDW: 13.5 % (ref 11.5–15.5)
WBC: 6.5 10*3/uL (ref 4.0–10.5)
nRBC: 0 % (ref 0.0–0.2)

## 2023-01-10 LAB — TROPONIN I (HIGH SENSITIVITY): Troponin I (High Sensitivity): 10 ng/L (ref <18)

## 2023-01-10 NOTE — ED Triage Notes (Signed)
Pt reports HTN for the past few days, pt states she had not been taking her prescribed metoprolol for the past few months but began taking it again 2 weeks ago. Pt denies chest pain or headache. Denies weakness or numbness in extremities, speech clear.

## 2023-01-10 NOTE — ED Provider Triage Note (Signed)
Emergency Medicine Provider Triage Evaluation Note  [name] , a 89 y.o. female  was evaluated in triage.  Pt complains of high blood pressure. Only reports feeling light headed.  Review of Systems  Positive: Light headed Negative: CP, SOB, Headache, blurry vision  Physical Exam  BP (!) 140/47 (BP Location: Right Arm)   Pulse 68   Temp 98.5 F (36.9 C)   Resp 18   SpO2 95%  Gen:   Awake, no distress   Resp:  Normal effort  MSK:   Moves extremities without difficulty  Other:    Medical Decision Making  Medically screening exam initiated at 7:49 PM.  Appropriate orders placed.  [name] was informed that the remainder of the evaluation will be completed by another provider, this initial triage assessment does not replace that evaluation, and the importance of remaining in the ED until their evaluation is complete.     Cameron Ali, PA-C 01/10/23 1950

## 2023-01-11 ENCOUNTER — Emergency Department
Admission: EM | Admit: 2023-01-11 | Discharge: 2023-01-11 | Discharge status: Against Medical Advice | Payer: Medicare Other | Attending: Emergency Medicine | Admitting: Emergency Medicine

## 2023-01-21 ENCOUNTER — Observation Stay (HOSPITAL_BASED_OUTPATIENT_CLINIC_OR_DEPARTMENT_OTHER)
Admit: 2023-01-21 | Discharge: 2023-01-21 | Disposition: A | Discharge status: Home / Self Care | Payer: Medicare Other | Attending: Family Medicine | Admitting: Family Medicine

## 2023-01-21 ENCOUNTER — Other Ambulatory Visit: Payer: Self-pay

## 2023-01-21 ENCOUNTER — Emergency Department: Payer: Medicare Other

## 2023-01-21 ENCOUNTER — Observation Stay
Admission: EM | Admit: 2023-01-21 | Discharge: 2023-01-22 | Discharge status: Against Medical Advice | Payer: Medicare Other | Attending: Emergency Medicine | Admitting: Emergency Medicine

## 2023-01-21 DIAGNOSIS — R55 Syncope and collapse: Principal | ICD-10-CM

## 2023-01-21 DIAGNOSIS — Z79899 Other long term (current) drug therapy: Secondary | ICD-10-CM | POA: Diagnosis not present

## 2023-01-21 DIAGNOSIS — I110 Hypertensive heart disease with heart failure: Secondary | ICD-10-CM | POA: Diagnosis not present

## 2023-01-21 DIAGNOSIS — K219 Gastro-esophageal reflux disease without esophagitis: Secondary | ICD-10-CM

## 2023-01-21 DIAGNOSIS — Z85850 Personal history of malignant neoplasm of thyroid: Secondary | ICD-10-CM | POA: Insufficient documentation

## 2023-01-21 DIAGNOSIS — Z1152 Encounter for screening for COVID-19: Secondary | ICD-10-CM | POA: Insufficient documentation

## 2023-01-21 DIAGNOSIS — J45909 Unspecified asthma, uncomplicated: Secondary | ICD-10-CM | POA: Diagnosis not present

## 2023-01-21 DIAGNOSIS — Z8673 Personal history of transient ischemic attack (TIA), and cerebral infarction without residual deficits: Secondary | ICD-10-CM

## 2023-01-21 DIAGNOSIS — E119 Type 2 diabetes mellitus without complications: Secondary | ICD-10-CM

## 2023-01-21 DIAGNOSIS — I10 Essential (primary) hypertension: Secondary | ICD-10-CM | POA: Diagnosis present

## 2023-01-21 DIAGNOSIS — I951 Orthostatic hypotension: Secondary | ICD-10-CM

## 2023-01-21 DIAGNOSIS — I2510 Atherosclerotic heart disease of native coronary artery without angina pectoris: Secondary | ICD-10-CM

## 2023-01-21 DIAGNOSIS — Z7902 Long term (current) use of antithrombotics/antiplatelets: Secondary | ICD-10-CM | POA: Diagnosis not present

## 2023-01-21 DIAGNOSIS — E039 Hypothyroidism, unspecified: Secondary | ICD-10-CM | POA: Diagnosis not present

## 2023-01-21 DIAGNOSIS — Z955 Presence of coronary angioplasty implant and graft: Secondary | ICD-10-CM | POA: Diagnosis not present

## 2023-01-21 DIAGNOSIS — I5032 Chronic diastolic (congestive) heart failure: Secondary | ICD-10-CM | POA: Insufficient documentation

## 2023-01-21 LAB — ECHOCARDIOGRAM COMPLETE
AR max vel: 1.89 cm2
AV Area VTI: 2.04 cm2
AV Area mean vel: 1.97 cm2
AV Mean grad: 5 mm[Hg]
AV Peak grad: 8.4 mm[Hg]
Ao pk vel: 1.45 m/s
Area-P 1/2: 3 cm2
Height: 60 in
MV VTI: 1.65 cm2
S' Lateral: 2.7 cm
Weight: 2048 [oz_av]

## 2023-01-21 LAB — CBC WITH DIFFERENTIAL/PLATELET
Abs Immature Granulocytes: 0.02 10*3/uL (ref 0.00–0.07)
Basophils Absolute: 0 10*3/uL (ref 0.0–0.1)
Basophils Relative: 1 %
Eosinophils Absolute: 0.3 10*3/uL (ref 0.0–0.5)
Eosinophils Relative: 4 %
HCT: 34.4 % — ABNORMAL LOW (ref 36.0–46.0)
Hemoglobin: 10.8 g/dL — ABNORMAL LOW (ref 12.0–15.0)
Immature Granulocytes: 0 %
Lymphocytes Relative: 30 %
Lymphs Abs: 1.9 10*3/uL (ref 0.7–4.0)
MCH: 28.6 pg (ref 26.0–34.0)
MCHC: 31.4 g/dL (ref 30.0–36.0)
MCV: 91 fL (ref 80.0–100.0)
Monocytes Absolute: 0.5 10*3/uL (ref 0.1–1.0)
Monocytes Relative: 7 %
Neutro Abs: 3.7 10*3/uL (ref 1.7–7.7)
Neutrophils Relative %: 58 %
Platelets: 188 10*3/uL (ref 150–400)
RBC: 3.78 MIL/uL — ABNORMAL LOW (ref 3.87–5.11)
RDW: 13.2 % (ref 11.5–15.5)
WBC: 6.4 10*3/uL (ref 4.0–10.5)
nRBC: 0 % (ref 0.0–0.2)

## 2023-01-21 LAB — LACTIC ACID, PLASMA: Lactic Acid, Venous: 1.3 mmol/L (ref 0.5–1.9)

## 2023-01-21 LAB — CBG MONITORING, ED
Glucose-Capillary: 105 mg/dL — ABNORMAL HIGH (ref 70–99)
Glucose-Capillary: 90 mg/dL (ref 70–99)
Glucose-Capillary: 95 mg/dL (ref 70–99)
Glucose-Capillary: 130 mg/dL — ABNORMAL HIGH (ref 70–99)

## 2023-01-21 LAB — COMPREHENSIVE METABOLIC PANEL
ALT: 13 U/L (ref 0–44)
AST: 23 U/L (ref 15–41)
Albumin: 3.5 g/dL (ref 3.5–5.0)
Alkaline Phosphatase: 53 U/L (ref 38–126)
Anion gap: 14 (ref 5–15)
BUN: 24 mg/dL — ABNORMAL HIGH (ref 8–23)
CO2: 19 mmol/L — ABNORMAL LOW (ref 22–32)
Calcium: 8.5 mg/dL — ABNORMAL LOW (ref 8.9–10.3)
Chloride: 109 mmol/L (ref 98–111)
Creatinine, Ser: 0.94 mg/dL (ref 0.44–1.00)
GFR, Estimated: 59 mL/min — ABNORMAL LOW (ref >60)
Glucose, Bld: 158 mg/dL — ABNORMAL HIGH (ref 70–99)
Potassium: 4.2 mmol/L (ref 3.5–5.1)
Sodium: 142 mmol/L (ref 135–145)
Total Bilirubin: 0.7 mg/dL (ref 0.0–1.2)
Total Protein: 6 g/dL — ABNORMAL LOW (ref 6.5–8.1)

## 2023-01-21 LAB — RESP PANEL BY RT-PCR (RSV, FLU A&B, COVID)  RVPGX2
Influenza A by PCR: NEGATIVE
Influenza B by PCR: NEGATIVE
Resp Syncytial Virus by PCR: NEGATIVE
SARS Coronavirus 2 by RT PCR: NEGATIVE

## 2023-01-21 LAB — HEMOGLOBIN A1C
Hgb A1c MFr Bld: 5.9 % — ABNORMAL HIGH (ref 4.8–5.6)
Mean Plasma Glucose: 122.63 mg/dL

## 2023-01-21 LAB — TROPONIN I (HIGH SENSITIVITY)
Troponin I (High Sensitivity): 9 ng/L (ref <18)
Troponin I (High Sensitivity): 10 ng/L (ref <18)

## 2023-01-21 LAB — URIC ACID: Uric Acid, Serum: 5.7 mg/dL (ref 2.5–7.1)

## 2023-01-21 LAB — TSH: TSH: 0.484 u[IU]/mL (ref 0.350–4.500)

## 2023-01-21 MED ORDER — SODIUM CHLORIDE 0.9 % IV SOLN: INTRAVENOUS | Status: DC

## 2023-01-21 MED ORDER — DIPHENHYDRAMINE HCL 25 MG PO CAPS
25.0000 mg | ORAL_CAPSULE | Freq: Four times a day (QID) | ORAL | Status: DC | PRN
  Administered 2023-01-21: 25 mg via ORAL
  Filled 2023-01-21: qty 1

## 2023-01-21 MED ORDER — LEVOTHYROXINE SODIUM 100 MCG PO TABS
100.0000 ug | ORAL_TABLET | Freq: Every day | ORAL | Status: DC
  Administered 2023-01-22: 100 ug via ORAL
  Filled 2023-01-21: qty 1

## 2023-01-21 MED ORDER — LEVETIRACETAM 250 MG PO TABS
250.0000 mg | ORAL_TABLET | Freq: Two times a day (BID) | ORAL | Status: DC
Start: 2023-01-21 — End: 2023-01-22
  Administered 2023-01-21: 250 mg via ORAL
  Filled 2023-01-21 (×2): qty 1

## 2023-01-21 MED ORDER — ROSUVASTATIN CALCIUM 5 MG PO TABS
5.0000 mg | ORAL_TABLET | Freq: Every day | ORAL | Status: DC
  Administered 2023-01-21: 5 mg via ORAL
  Filled 2023-01-21: qty 1

## 2023-01-21 MED ORDER — ONDANSETRON HCL 4 MG/2ML IJ SOLN: 4.0000 mg | Freq: Four times a day (QID) | INTRAMUSCULAR | Status: DC | PRN

## 2023-01-21 MED ORDER — INSULIN ASPART 100 UNIT/ML IJ SOLN
0.0000 [IU] | Freq: Three times a day (TID) | INTRAMUSCULAR | Status: DC
  Administered 2023-01-22: 2 [IU] via SUBCUTANEOUS
  Filled 2023-01-21: qty 1

## 2023-01-21 MED ORDER — ONDANSETRON HCL 4 MG PO TABS: 4.0000 mg | ORAL_TABLET | Freq: Four times a day (QID) | ORAL | Status: DC | PRN

## 2023-01-21 MED ORDER — SODIUM CHLORIDE 0.9 % IV BOLUS
1000.0000 mL | Freq: Once | INTRAVENOUS | Status: AC
  Administered 2023-01-21: 1000 mL via INTRAVENOUS

## 2023-01-21 MED ORDER — ENOXAPARIN SODIUM 40 MG/0.4ML IJ SOSY
40.0000 mg | PREFILLED_SYRINGE | INTRAMUSCULAR | Status: DC
  Administered 2023-01-21 – 2023-01-22 (×2): 40 mg via SUBCUTANEOUS
  Filled 2023-01-21 (×2): qty 0.4

## 2023-01-21 MED ORDER — SODIUM CHLORIDE 0.9% FLUSH
3.0000 mL | Freq: Two times a day (BID) | INTRAVENOUS | Status: DC
  Administered 2023-01-21 – 2023-01-22 (×3): 3 mL via INTRAVENOUS

## 2023-01-21 NOTE — ED Provider Notes (Signed)
 [facility] Provider Note    Event Date/Time   First MD Initiated Contact with Patient 01/21/23 0421     (approximate)   History   Loss of Consciousness   HPI  [name] is a 89 y.o. female brought to the ED via EMS from home with a chief complaint of syncope x 2.  Patient got up to take some aspirin  to help with pain from a recent fall when she felt lightheaded and passed out in the kitchen.  Denies injury.  EMS reports normal blood pressure while laying, blood pressure 88/40 after sitting patient up and she had a second syncopal episode.  Patient reports recent respiratory bug.  Currently denies fever/chills, chest pain, shortness of breath, abdominal pain, nausea, vomiting or dizziness.     Past Medical History   Past Medical History:  Diagnosis Date   CAD (coronary artery disease)    a. lexiscan 08/2013: nl wall motion, no ischemia, EF 50-55%; b. cardiac cath 05/31/2014: mLAD 90%, dLAD 50%, dLCx 60%, 1st Mrg 80%, pRCA 60% s/p PCI/DES, mRCA 1st lesion 70%, mRCA 2nd 95% s/p PCI/long DES to cover entire mRCA, RPLB 40%;  c. 05/2014 Staged PCI of LAD w/ 2.5 x 15 mm Xience Alpine DES.   CAD (coronary artery disease) 02/11/2021   Carotid artery disease (HCC)    Chronic anxiety    Diverticulosis    Esophagitis    GERD (gastroesophageal reflux disease)    History of colon polyps    History of echocardiogram    a. 08/2013: normal LVSF, nl RVSF, no valvular regurgitation or stenosis    Hyperlipidemia    Hypertension    Hypothyroidism    Mild reactive airways disease    Osteoarthritis    PONV (postoperative nausea and vomiting)    Rheumatoid arthritis (HCC)    Stroke (HCC)    a. 05/2014 pt c/o garbled speech following cath 5/19. MRI/A 5/26 showed left caudate infarct->conservative mgmt per neuro.   Thyroid  cancer (HCC)    a. s/p right sided hemi-thyroidectomy; b. planning for radioactive iodine tx; c. followed by Dr. Earmon   Type 2 diabetes  mellitus [facility])      Active Problem List   Patient Active Problem List   Diagnosis Date Noted   Recurrent syncope 01/21/2023   Prediabetes 02/14/2022   Stroke-like symptom 02/12/2022   Visual impairment 02/12/2022   Hypokalemia 10/14/2021   Hypomagnesemia 10/14/2021   Weakness 10/13/2021   COVID-19 virus infection 10/13/2021   Sundowning 02/14/2021   Dens fracture (HCC) 02/12/2021   Unsteady gait 02/12/2021   Hypothyroidism 02/11/2021   CAD (coronary artery disease) 02/11/2021   Multiple skin tears 02/11/2021   Chronic diastolic CHF (congestive heart failure) (HCC) 02/11/2021   Seizure (HCC) 08/25/2020   Diverticulitis 07/24/2020   Intractable nausea and vomiting 07/23/2020   Generalized weakness 07/23/2020   Achilles tendon contracture, bilateral 06/29/2017   Acute bilateral low back pain without sciatica 03/17/2016   Idiopathic chronic gout of multiple sites without tophus 03/17/2016   Idiopathic gout of multiple sites 11/27/2015   Cellulitis 05/15/2015   Bilateral leg edema 02/08/2015   Chronic anxiety 11/22/2014   Cerebrovascular accident (CVA) (HCC) 07/25/2014   Arterial vascular disease 07/25/2014   Swelling of right lower extremity 06/22/2014   Diabetes mellitus without complication (HCC)    Stroke (HCC) 06/08/2014   Anemia 06/07/2014   Unstable angina (HCC) 06/06/2014   Carotid artery disease (HCC)    Atherosclerosis of native coronary artery  with stable angina pectoris (HCC)    Hyperlipidemia    Essential hypertension    Angina pectoris (HCC) 05/31/2014   SOB (shortness of breath) 04/11/2014   Palpitations 04/11/2014   Other fatigue 04/11/2014   Thyroid  cancer (HCC) 04/11/2014     Past Surgical History   Past Surgical History:  Procedure Laterality Date   ABDOMINAL HYSTERECTOMY     APPENDECTOMY     AUGMENTATION MAMMAPLASTY     BACK SURGERY     BREAST IMPLANT REMOVAL  06/2001   CARDIAC CATHETERIZATION N/A 06/06/2014   Procedure: Coronary/Graft  Atherectomy;  Surgeon: Deatrice DELENA Cage, MD;  Location: [facility] INVASIVE CV LAB;  Service: Cardiovascular;  Laterality: N/A;   CARDIAC CATHETERIZATION N/A 05/31/2014   Procedure: Left Heart Cath;  Surgeon: Evalene JINNY Lunger, MD;  Location: [facility] INVASIVE CV LAB;  Service: Cardiovascular;  Laterality: N/A;   CARDIAC CATHETERIZATION N/A 05/31/2014   Procedure: Coronary Stent Intervention;  Surgeon: Deatrice DELENA Cage, MD;  Location: [facility] INVASIVE CV LAB;  Service: Cardiovascular;  Laterality: N/A;   CARPAL TUNNEL RELEASE     I've have a total of 7 (06/06/2014)   CATARACT EXTRACTION W/ INTRAOCULAR LENS  IMPLANT, BILATERAL Bilateral    CHOLECYSTECTOMY     CORONARY ANGIOPLASTY WITH STENT PLACEMENT  05/31/2014; 06/06/2014   2; 1   GANGLION CYST EXCISION Right    AC joint   LUMBAR LAMINECTOMY  X 6   MENISCUS REPAIR Bilateral    2 on the right; 1 on the left   ROTATOR CUFF REPAIR     I think 2 left, 2 right   THYROIDECTOMY     TONSILLECTOMY     TUBAL LIGATION       Home Medications   Prior to Admission medications   Medication Sig Start Date End Date Taking? Authorizing Provider  acetaminophen  (TYLENOL ) 500 MG tablet Take 500-1,000 mg by mouth every 6 (six) hours as needed for mild pain or moderate pain.    [provider]  albuterol  (VENTOLIN  HFA) 108 (90 Base) MCG/ACT inhaler Inhale 2 puffs into the lungs every 6 (six) hours as needed for wheezing or shortness of breath. Patient not taking: Reported on 03/31/2022 10/15/21   Jhonny Sahara B, MD  allopurinol  (ZYLOPRIM ) 100 MG tablet TAKE 1 TABLET(100 MG) BY MOUTH TWICE DAILY 11/14/18   Harden Jerona GAILS, MD  azithromycin  (ZITHROMAX  Z-PAK) 250 MG tablet 2 pills today then 1 pill a day for 4 days 10/04/22   Gasper Devere ORN, PA-C  clopidogrel  (PLAVIX ) 75 MG tablet TAKE 1 TABLET BY MOUTH DAILY 09/01/22   Gollan, Timothy J, MD  Colchicine  (MITIGARE ) 0.6 MG CAPS Take 0.6 mg by mouth daily as needed. 03/05/20   Gollan, Timothy J, MD  Evolocumab   (REPATHA  SURECLICK) 140 MG/ML SOAJ Inject 140 mg into the skin every 14 (fourteen) days. 08/10/22   Gollan, Timothy J, MD  furosemide  (LASIX ) 20 MG tablet TAKE 1 TABLET BY MOUTH DAILY AS  NEEDED FOR FLUID (TAKE WITH  POTASSIUM) 03/31/22   Gollan, Timothy J, MD  gabapentin  (NEURONTIN ) 100 MG capsule Take 100-200 mg by mouth at bedtime as needed. Patient not taking: Reported on 03/31/2022 02/04/22   [provider]  hydrocortisone  cream 1 % Apply 1 Application topically 4 (four) times daily. 06/12/22   Margrette, Myah A, PA-C  hydrOXYzine  (ATARAX ) 25 MG tablet Take 1 tablet (25 mg total) by mouth 3 (three) times daily. 03/31/22   Gollan, Timothy J, MD  isosorbide  mononitrate (IMDUR ) 30 MG  24 hr tablet Take 1 tablet (30 mg total) by mouth daily. 03/31/22   Gollan, Timothy J, MD  levETIRAcetam  (KEPPRA ) 250 MG tablet Take 1 tablet (250 mg total) by mouth 2 (two) times daily. 08/27/20   Amin, Sumayya, MD  levothyroxine  (SYNTHROID ) 100 MCG tablet Take 100 mcg by mouth daily. 09/17/21   [provider]  metoprolol  succinate (TOPROL -XL) 25 MG 24 hr tablet Take 1 tablet (25 mg total) by mouth daily. 03/31/22   Gollan, Timothy J, MD  montelukast  (SINGULAIR ) 10 MG tablet TAKE 1 TABLET(10 MG) BY MOUTH EVERY MORNING 02/19/22   Hester Alm BROCKS, MD  Multiple Vitamins-Minerals (ICAPS AREDS 2 PO) Take 1 tablet by mouth 2 (two) times daily.    [provider]  nitroGLYCERIN  (NITROSTAT ) 0.4 MG SL tablet Place 1 tablet (0.4 mg total) under the tongue every 5 (five) minutes as needed for chest pain. 09/30/21   Gollan, Timothy J, MD  potassium chloride  (KLOR-CON ) 10 MEQ tablet TAKE 1 TABLET BY MOUTH DAILY AS  NEEDED FOR NUTRIENT  REPLENISHMENT TAKE WITH  FUROSEMIDE  03/31/22   Gollan, Timothy J, MD  rosuvastatin  (CRESTOR ) 5 MG tablet TAKE 1 TABLET(5 MG) BY MOUTH DAILY 03/31/22   Gollan, Timothy J, MD  traZODone  (DESYREL ) 50 MG tablet Take 0.5 tablets (25 mg total) by mouth at bedtime as needed for sleep. Patient  not taking: Reported on 03/31/2022 02/14/21   Josette Ade, MD  triamcinolone ointment (KENALOG) 0.1 % Apply topically 2 (two) times daily. Patient not taking: Reported on 03/31/2022 02/04/22   [provider]     Allergies  Ciprofloxacin, Codeine, Metronidazole, Other, Petrolatum-zinc  oxide, Sulfa antibiotics, Neomycin-bacitracin  zn-polymyx, Bacitracin -polymyxin b, Celecoxib, Neomycin, Statins, and Vioxx [rofecoxib]   Family History   Family History  Problem Relation Age of Onset   Heart disease Sister        stent placed x 3      Physical Exam  Triage Vital Signs: ED Triage Vitals  Encounter Vitals Group     BP      Systolic BP Percentile      Diastolic BP Percentile      Pulse      Resp      Temp      Temp src      SpO2      Weight      Height      Head Circumference      Peak Flow      Pain Score      Pain Loc      Pain Education      Exclude from Growth Chart     Updated Vital Signs: BP (!) 120/46 (BP Location: Right Arm)   Pulse 63   Temp 97.6 F (36.4 C) (Oral)   Resp 18   Ht 5' (1.524 m)   Wt 58.1 kg   SpO2 100%   BMI 25.00 kg/m    General: Awake, mild distress.  CV:  RRR. Good peripheral perfusion.  Resp:  Normal effort.  CTAB. Abd:  Nontender.  No distention.  Other:  Alert and oriented x 3.  CN II-XII grossly intact.  5/5 motor strength and sensation all extremities.  MAE x 4.   ED Results / Procedures / Treatments  Labs (all labs ordered are listed, but only abnormal results are displayed) Labs Reviewed  CBC WITH DIFFERENTIAL/PLATELET - Abnormal; Notable for the following components:      Result Value   RBC 3.78 (*)  Hemoglobin 10.8 (*)    HCT 34.4 (*)    All other components within normal limits  COMPREHENSIVE METABOLIC PANEL - Abnormal; Notable for the following components:   CO2 19 (*)    Glucose, Bld 158 (*)    BUN 24 (*)    Calcium  8.5 (*)    Total Protein 6.0 (*)    GFR, Estimated 59 (*)    All other components  within normal limits  RESP PANEL BY RT-PCR (RSV, FLU A&B, COVID)  RVPGX2  CULTURE, BLOOD (ROUTINE X 2)  CULTURE, BLOOD (ROUTINE X 2)  LACTIC ACID, PLASMA  URINALYSIS, W/ REFLEX TO CULTURE (INFECTION SUSPECTED)  URIC ACID  TROPONIN I (HIGH SENSITIVITY)     EKG  ED ECG REPORT I, Javyn Havlin J, the attending physician, personally viewed and interpreted this ECG.   Date: 01/21/2023  EKG Time: 0423  Rate: 62  Rhythm: normal sinus rhythm  Axis: Normal  Intervals:none  ST&T Change: Nonspecific    RADIOLOGY I have independently visualized and interpreted patient's imaging studies as well as noted the radiology interpretation:  CT head: No ICH  Chest x-ray: No acute cardiopulmonary process  Official radiology report(s): DG Chest Port 1 View Result Date: 01/21/2023 CLINICAL DATA:  Syncopal episode with hypotension. EXAM: PORTABLE CHEST 1 VIEW COMPARISON:  AP Lat chest 01/10/2023. FINDINGS: The heart is slightly enlarged. No vascular congestion is seen. Stable mediastinum with aortic tortuosity, calcific plaque in the transverse segment. The lungs are mildly emphysematous but clear. Osteopenia and thoracic spondylosis. Chronic rotator cuff arthropathy. IMPRESSION: 1. No evidence of acute chest disease. Emphysema. Stable COPD chest. 2. Aortic atherosclerosis. Electronically Signed   By: Francis Quam M.D.   On: 01/21/2023 05:39   CT Head Wo Contrast Result Date: 01/21/2023 CLINICAL DATA:  Mental status changes, unknown cause. EXAM: CT HEAD WITHOUT CONTRAST TECHNIQUE: Contiguous axial images were obtained from the base of the skull through the vertex without intravenous contrast. RADIATION DOSE REDUCTION: This exam was performed according to the departmental dose-optimization program which includes automated exposure control, adjustment of the mA and/or kV according to patient size and/or use of iterative reconstruction technique. COMPARISON:  Head CT and MRI brain both 02/12/2022 FINDINGS:  Brain: There is moderately advanced cerebral and mild cerebellar atrophy, moderate atrophic ventriculomegaly and moderate to severe small vessel disease of the cerebral white matter. No new asymmetry is seen concerning for a cortical based acute infarct, hemorrhage, mass or mass effect. There are chronic lacunar infarcts in both external capsules. There is no midline shift. The basal cisterns are clear. Vascular: There are calcifications in both siphons, both distal vertebral arteries. There are no hyperdense central vessels. Skull: Negative for fractures or focal lesions. Sinuses/Orbits: Trace chronic fluid in both mastoid tips. Clear paranasal sinuses with right-sided deviation and spurring of the nasal septum. Again noted old lens replacements with otherwise negative orbits. Other: None. IMPRESSION: 1. No acute intracranial CT findings or interval changes. 2. Atrophy and small-vessel disease. 3. Intracranial atherosclerosis. Electronically Signed   By: Francis Quam M.D.   On: 01/21/2023 05:23     PROCEDURES:  Critical Care performed: No  .1-3 Lead EKG Interpretation  Performed by: Robinette Vermell PARAS, MD Authorized by: Robinette Vermell PARAS, MD     Interpretation: normal     ECG rate:  62   ECG rate assessment: normal     Rhythm: sinus rhythm     Ectopy: none     Conduction: normal   Comments:  Patient placed on cardiac monitor to evaluate for arrhythmias    MEDICATIONS ORDERED IN ED: Medications  sodium chloride  0.9 % bolus 1,000 mL (1,000 mLs Intravenous New Bag/Given 01/21/23 0426)     IMPRESSION / MDM / ASSESSMENT AND PLAN / ED COURSE  I reviewed the triage vital signs and the nursing notes.                             89 year old female presenting with syncope x 2.  Differential diagnosis includes but is not limited to CVA, ACS, metabolic, infectious etiologies, etc.  I have personally reviewed patient's records and note a PCP office visit on 12/23/2022 for bilateral knee  pain.  Patient's presentation is most consistent with acute presentation with potential threat to life or bodily function.  The patient is on the cardiac monitor to evaluate for evidence of arrhythmia and/or significant heart rate changes.  Will obtain sepsis workup, CT head, chest x-ray.  Initiate IV fluid resuscitation.  Will reassess.  Anticipate hospitalization.  Clinical Course as of 01/21/23 0551  Thu Jan 21, 2023  0528 Patient resting in no acute distress.  Husband at bedside.  Tells me he is not sure if patient is taking her metoprolol  as directed as this is her second episode of low blood pressure since her cardiologist put her on the medication.  Will consult hospital services for evaluation and admission. [JS]  [phone] Spouse asking to check uric acid level because he states patient is not good about taking her colchicine . [JS]    Clinical Course User Index [JS] Robinette Vermell PARAS, MD     FINAL CLINICAL IMPRESSION(S) / ED DIAGNOSES   Final diagnoses:  Syncope, unspecified syncope type  Orthostatic hypotension     Rx / DC Orders   ED Discharge Orders     None        Note:  This document was prepared using Dragon voice recognition software and may include unintentional dictation errors.   Chitara Clonch J, MD 01/21/23 [phone]

## 2023-01-21 NOTE — Assessment & Plan Note (Signed)
 Bp stable  Titrate regimen w/ recurrent syncope

## 2023-01-21 NOTE — Assessment & Plan Note (Signed)
 Recurrent syncope x several months  Pt denies any palpitations, chest pain prior to event  Symptoms predominantly going from sitting to standing  Will hold offending medication for now including metoprolol   Orthostatics pending  Check 2D ECHO  Noted to have been followed by Dr. Gollan outpt for issues including near syncope and CAD  Consult cardiology as appropriate

## 2023-01-21 NOTE — Assessment & Plan Note (Signed)
 Blood sugar 150s SSI  A1C  Monitor

## 2023-01-21 NOTE — Evaluation (Signed)
 Physical Therapy Evaluation Patient Details Name: [name] MRN: [id number] DOB: 05/20/1933 Today's Date: 01/21/2023  History of Present Illness  89 y.o. female with medical history significant of CAD, GERD, HTN, HLD, hypothyroidism, remote hx/o CVA presenting w/ recurrent syncope. Pt reports multiple episodes of passing out over the last several months.  Clinical Impression  Pt pleased to see PT and eager to show that she can move well and prove she can Just get out of here.   She was asymptomatic and had stable vitals t/o the session.  BP was relatively normal and appeared uneffected by position changes.  She was able to ambulate ~150 ft, get on/off the commode and manage pericare and hand washing w/o assist.  She reports feels a little slower than her baseline and has some bruising pain from the recent fall.  Pt did have some mild unsteadiness with no LOBs, educated on 3WW vs other AD options and safety managing in/out of the home.  Pt could benefit from further PT but indicates that she is not interested at this time.  Will maintain on caseload to facilitate safe discharge planning and address functional changes from baseline.        If plan is discharge home, recommend the following: A little help with walking and/or transfers;Assist for transportation;Assistance with cooking/housework   Can travel by private vehicle        Equipment Recommendations None recommended by [employer] walker (2 wheels) (has 3 and 4 WW, could benefit from more stable FWW)  Recommendations for Other Services       Functional Status Assessment Patient has had a recent decline in their functional status and demonstrates the ability to make significant improvements in function in a reasonable and predictable amount of time.     Precautions / Restrictions Precautions Precautions: Fall Restrictions Weight Bearing Restrictions Per Provider Order: No      Mobility  Bed Mobility Overal bed  mobility: Modified Independent                  Transfers Overall transfer level: Modified independent Equipment used:  (3WW)               General transfer comment: Pt was able to rise to standing from multiple surfaces w/o assist    Ambulation/Gait Ambulation/Gait assistance: Contact guard assist Gait Distance (Feet): 150 Feet Assistive device:  (3WW)         General Gait Details: Pt with slow, cautious gait w/o LOBs.  She did have a few episodes of mild unsteadiness but was able to use 3WW to stabilize w/o issue.  Pt reports being slower than her baseline.  Stairs            Wheelchair Mobility     Tilt Bed    Modified Rankin (Stroke Patients Only)       Balance Overall balance assessment: Mild deficits observed, not formally tested (good sitting balance, intermittent unsteadiness during gait and unsupported standing acts)                                           Pertinent Vitals/Pain Pain Assessment Pain Assessment: Faces Faces Pain Scale: Hurts little more Pain Location: bruised hip from this fall    Home Living Family/patient expects to be discharged to:: Private residence Living Arrangements: Spouse/significant other Available Help at Discharge: Family;Available 24 hours/day Type of Home:  House Home Access: Level entry       Home Layout: One level Home Equipment: [employer] (2 wheels);Rollator (4 wheels);Shower seat;Other (comment);Cane - single point;BSC/3in1;Grab bars - tub/shower (3ww)      Prior Function Prior Level of Function : Independent/Modified Independent             Mobility Comments: States she is able to get out of the house daily, almost always uses her 3WW.  States she has previous had issues with falls but only this 1 fall in the last 6 months. ADLs Comments: Pt reports ind with self care tasks and her and husband split IADLs. Husband provides transportation.     Extremity/Trunk  Assessment   Upper Extremity Assessment Upper Extremity Assessment: Generalized weakness    Lower Extremity Assessment Lower Extremity Assessment: Generalized weakness       Communication   Communication Communication: No apparent difficulties  Cognition Arousal: Alert Behavior During Therapy: Restless Overall Cognitive Status: Within Functional Limits for tasks assessed                                          General Comments General comments (skin integrity, edema, etc.): Pt focused on leaving ASAP.  Seated on arrival with BP 117/95, after ambulation 137/56.  Pt asymptomatic with no lightheadedness t/o the session.    Exercises     Assessment/Plan    PT Assessment Patient needs continued PT services  PT Problem List Decreased activity tolerance;Decreased balance;Decreased mobility;Decreased safety awareness;Decreased knowledge of use of DME;Pain       PT Treatment Interventions DME instruction;Gait training;Therapeutic activities;Therapeutic exercise;Balance training;Patient/family education;Functional mobility training    PT Goals (Current goals can be found in the Care Plan section)       Frequency Min 1X/week     Co-evaluation               AM-PAC PT 6 Clicks Mobility  Outcome Measure Help needed turning from your back to your side while in a flat bed without using bedrails?: None Help needed moving from lying on your back to sitting on the side of a flat bed without using bedrails?: None Help needed moving to and from a bed to a chair (including a wheelchair)?: None Help needed standing up from a chair using your arms (e.g., wheelchair or bedside chair)?: None Help needed to walk in hospital room?: None Help needed climbing 3-5 steps with a railing? : A Little 6 Click Score: 23    End of Session Equipment Utilized During Treatment: Gait belt Activity Tolerance: Patient tolerated treatment well Patient left: in bed;with call  bell/phone within reach;with family/visitor present Nurse Communication: Mobility status PT Visit Diagnosis: Unsteadiness on feet (R26.81);History of falling (Z91.81)    Time: 8584-8554 PT Time Calculation (min) (ACUTE ONLY): 30 min   Charges:   PT Evaluation $PT Eval Low Complexity: 1 Low PT Treatments $Gait Training: 8-22 mins PT General Charges $$ ACUTE PT VISIT: 1 Visit         Carmin JONELLE Deed, DPT 01/21/2023, 3:56 PM

## 2023-01-21 NOTE — Evaluation (Signed)
 Occupational Therapy Evaluation Patient Details Name: [name] MRN: [id number] DOB: September 16, 1933 Today's Date: 01/21/2023   History of Present Illness 89 y.o. female with medical history significant of CAD, GERD, HTN, HLD, hypothyroidism, remote hx/o CVA presenting w/ recurrent syncope. Pt reports multiple episodes of passing out over the last several months.   Clinical Impression   Chart reviewed, pt greeted sitting on edge of bed with PT, agreeable to OT evaluation. PTA pt is generally MOD I in ADL/IADL (her and her husband help each other), amb with 3WW. Pt reports she feels close to her baseline functional status. ADL mobility with 3WW completed with supervision-CGA, LB dressing completed with supervision. Pt reports she feels she does not need OT services at this time. Discussed safe ADL task completion and referral to [facility] if functional status changes. No further OT needs identified at this time, OT will sign off. Please re consult if there is a change in functional status.       If plan is discharge home, recommend the following: Assistance with cooking/housework    Functional Status Assessment  Patient has had a recent decline in their functional status and demonstrates the ability to make significant improvements in function in a reasonable and predictable amount of time.  Equipment Recommendations  None recommended by OT (discussed use of bsc, pt declined)    Recommendations for Other Services       Precautions / Restrictions Precautions Precautions: Fall Restrictions Weight Bearing Restrictions Per Provider Order: No      Mobility Bed Mobility Overal bed mobility: Modified Independent                  Transfers Overall transfer level: Needs assistance Equipment used: None (3WW) Transfers: Sit to/from Stand Sit to Stand: Modified independent (Device/Increase time), Supervision           General transfer comment: multiple attempts       Balance Overall balance assessment: Needs assistance Sitting-balance support: Feet supported Sitting balance-Leahy Scale: Good     Standing balance support: Bilateral upper extremity supported Standing balance-Leahy Scale: Fair                             ADL either performed or assessed with clinical judgement   ADL Overall ADL's : Needs assistance/impaired Eating/Feeding: Set up;Sitting   Grooming: Sitting;Set up           Upper Body Dressing : Modified independent;Sitting   Lower Body Dressing: Sitting/lateral leans;Set up   Toilet Transfer: Supervision/safety (3WW, simulated)   Toileting- Clothing Manipulation and Hygiene: Supervision/safety;Sitting/lateral lean       Functional mobility during ADLs: Supervision/safety;Contact guard assist (3WW in room approx 10')       Vision Patient Visual Report: No change from baseline       Perception         Praxis         Pertinent Vitals/Pain Pain Assessment Pain Assessment: Faces Faces Pain Scale: Hurts a little bit Pain Location: generalized Pain Descriptors / Indicators: Discomfort Pain Intervention(s): Limited activity within patient's tolerance     Extremity/Trunk Assessment Upper Extremity Assessment Upper Extremity Assessment: Generalized weakness   Lower Extremity Assessment Lower Extremity Assessment: Generalized weakness   Cervical / Trunk Assessment Cervical / Trunk Assessment: Normal   Communication Communication Communication: No apparent difficulties   Cognition Arousal: Alert Behavior During Therapy: WFL for tasks assessed/performed Overall Cognitive Status: Within Functional Limits for tasks assessed  General Comments  Vss throughout    Exercises Other Exercises Other Exercises: edu re: role of OT, role of rehab, discharge recommendations, safe ADL completion   Shoulder Instructions      Home Living  Family/patient expects to be discharged to:: Private residence Living Arrangements: Spouse/significant other Available Help at Discharge: Family;Available 24 hours/day Type of Home: House Home Access: Level entry     Home Layout: One level     Bathroom Shower/Tub: Producer, Television/film/video: Standard     Home Equipment: Agricultural Consultant (2 wheels);Rollator (4 wheels);Shower seat;Other (comment);Cane - single point;BSC/3in1;Grab bars - tub/shower          Prior Functioning/Environment Prior Level of Function : Independent/Modified Independent             Mobility Comments: pt uses a 3WW, goes out daily ADLs Comments: pt reports MOD I-I in ADL/IADL (her and husband help each other),        OT Problem List:        OT Treatment/Interventions:      OT Goals(Current goals can be found in the care plan section) Acute Rehab OT Goals Patient Stated Goal: go home asap OT Goal Formulation: With patient Time For Goal Achievement: 02/04/23 Potential to Achieve Goals: Good  OT Frequency:      Co-evaluation              AM-PAC OT 6 Clicks Daily Activity     Outcome Measure Help from another person eating meals?: None Help from another person taking care of personal grooming?: None Help from another person toileting, which includes using toliet, bedpan, or urinal?: None Help from another person bathing (including washing, rinsing, drying)?: A Little Help from another person to put on and taking off regular upper body clothing?: None Help from another person to put on and taking off regular lower body clothing?: None 6 Click Score: 23   End of Session Equipment Utilized During Treatment: Other (comment) (6TT) Nurse Communication: Mobility status  Activity Tolerance: Patient tolerated treatment well Patient left: in bed;with call bell/phone within reach;with family/visitor present                   Time: 8554-8494 OT Time Calculation (min): 20 min Charges:   OT General Charges $OT Visit: 1 Visit OT Evaluation $OT Eval Low Complexity: 1 Low  Therisa Sheffield, OTD OTR/L  01/21/23, 4:13 PM

## 2023-01-21 NOTE — H&P (Signed)
 History and Physical    Patient: [name] FMW:[id number] DOB: 06-07-1933 DOA: 01/21/2023 DOS: the patient was seen and examined on 01/21/2023 PCP: Auston Reyes BIRCH, MD  Patient coming from: Home  Chief Complaint:  Chief Complaint  Patient presents with   Loss of Consciousness   HPI: [name] is a 89 y.o. female with medical history significant of CAD, GERD, HTN, HLD, hypothyroidism, remote hx/o CVA presenting w/ recurrent syncope. Pt reports multiple episodes of passing out over the last several months.  Patient denies any chest pain or shortness of breath associated with episodes.  No focal hemiparesis or confusion.  Noted to be followed by Dr. Perla outpatient cardiology.  Last seen spring 2024.  Noted reports of near syncope during that evaluation.  Patient denies any recent medication changes.  Unclear if she is taking metoprolol  or other blood pressure medication.  States symptoms are predominately going from sitting to standing.  No abdominal pain diarrhea.  No orthopnea PND.  Minimal fatigue. Presented to the ER afebrile, hemodynamically stable.  White count 6.4, hemoglobin 10.3, platelets 188, creatinine 0.94.  Glucose 158.  Troponin 9.  EKG normal sinus rhythm.  COVID flu and RSV negative.  CT head and chest x-ray within normal limits. Review of Systems: As mentioned in the history of present illness. All other systems reviewed and are negative. Past Medical History:  Diagnosis Date   CAD (coronary artery disease)    a. lexiscan 08/2013: nl wall motion, no ischemia, EF 50-55%; b. cardiac cath 05/31/2014: mLAD 90%, dLAD 50%, dLCx 60%, 1st Mrg 80%, pRCA 60% s/p PCI/DES, mRCA 1st lesion 70%, mRCA 2nd 95% s/p PCI/long DES to cover entire mRCA, RPLB 40%;  c. 05/2014 Staged PCI of LAD w/ 2.5 x 15 mm Xience Alpine DES.   CAD (coronary artery disease) 02/11/2021   Carotid artery disease (HCC)    Chronic anxiety    Diverticulosis    Esophagitis    GERD (gastroesophageal  reflux disease)    History of colon polyps    History of echocardiogram    a. 08/2013: normal LVSF, nl RVSF, no valvular regurgitation or stenosis    Hyperlipidemia    Hypertension    Hypothyroidism    Mild reactive airways disease    Osteoarthritis    PONV (postoperative nausea and vomiting)    Rheumatoid arthritis (HCC)    Stroke (HCC)    a. 05/2014 pt c/o garbled speech following cath 5/19. MRI/A 5/26 showed left caudate infarct->conservative mgmt per neuro.   Thyroid  cancer (HCC)    a. s/p right sided hemi-thyroidectomy; b. planning for radioactive iodine tx; c. followed by Dr. Earmon   Type 2 diabetes mellitus [facility])    Past Surgical History:  Procedure Laterality Date   ABDOMINAL HYSTERECTOMY     APPENDECTOMY     AUGMENTATION MAMMAPLASTY     BACK SURGERY     BREAST IMPLANT REMOVAL  06/2001   CARDIAC CATHETERIZATION N/A 06/06/2014   Procedure: Coronary/Graft Atherectomy;  Surgeon: Deatrice DELENA Cage, MD;  Location: [facility] INVASIVE CV LAB;  Service: Cardiovascular;  Laterality: N/A;   CARDIAC CATHETERIZATION N/A 05/31/2014   Procedure: Left Heart Cath;  Surgeon: Evalene JINNY Perla, MD;  Location: [facility] INVASIVE CV LAB;  Service: Cardiovascular;  Laterality: N/A;   CARDIAC CATHETERIZATION N/A 05/31/2014   Procedure: Coronary Stent Intervention;  Surgeon: Deatrice DELENA Cage, MD;  Location: [facility] INVASIVE CV LAB;  Service: Cardiovascular;  Laterality: N/A;   CARPAL TUNNEL RELEASE  I've have a total of 7 (06/06/2014)   CATARACT EXTRACTION W/ INTRAOCULAR LENS  IMPLANT, BILATERAL Bilateral    CHOLECYSTECTOMY     CORONARY ANGIOPLASTY WITH STENT PLACEMENT  05/31/2014; 06/06/2014   2; 1   GANGLION CYST EXCISION Right    AC joint   LUMBAR LAMINECTOMY  X 6   MENISCUS REPAIR Bilateral    2 on the right; 1 on the left   ROTATOR CUFF REPAIR     I think 2 left, 2 right   THYROIDECTOMY     TONSILLECTOMY     TUBAL LIGATION     Social History:  reports that she has never smoked. She has never  used smokeless tobacco. She reports that she does not drink alcohol and does not use drugs.  Allergies  Allergen Reactions   Ciprofloxacin Diarrhea and Nausea And Vomiting   Codeine Diarrhea, Nausea And Vomiting and Other (See Comments)   Metronidazole Diarrhea, Nausea And Vomiting and Nausea Only   Other Hives and Other (See Comments)    Distress Reaction:  Muscle pain Reaction:  Muscle pain Reaction:  Muscle pain   Petrolatum-Zinc  Oxide Hives   Sulfa Antibiotics Other (See Comments) and Hives    Distress Distress Distress Distress Other reaction(s): Not available   Neomycin-Bacitracin  Zn-Polymyx Other (See Comments)    Reaction:  Unknown  Reaction:  Unknown  Reaction:  Unknown  Reaction:  Unknown   Bacitracin -Polymyxin B Rash   Celecoxib Itching, Rash and Other (See Comments)   Neomycin Itching   Statins Other (See Comments)    Reaction:  Muscle pain Reaction:  Muscle pain Reaction:  Muscle pain   Vioxx [Rofecoxib] Itching and Rash    Family History  Problem Relation Age of Onset   Heart disease Sister        stent placed x 3     Prior to Admission medications   Medication Sig Start Date End Date Taking? Authorizing Provider  acetaminophen  (TYLENOL ) 500 MG tablet Take 500-1,000 mg by mouth every 6 (six) hours as needed for mild pain or moderate pain.    [provider]  albuterol  (VENTOLIN  HFA) 108 (90 Base) MCG/ACT inhaler Inhale 2 puffs into the lungs every 6 (six) hours as needed for wheezing or shortness of breath. Patient not taking: Reported on 03/31/2022 10/15/21   Jhonny Sahara B, MD  allopurinol  (ZYLOPRIM ) 100 MG tablet TAKE 1 TABLET(100 MG) BY MOUTH TWICE DAILY 11/14/18   Harden Jerona GAILS, MD  azithromycin  (ZITHROMAX  Z-PAK) 250 MG tablet 2 pills today then 1 pill a day for 4 days 10/04/22   Gasper Devere ORN, PA-C  clopidogrel  (PLAVIX ) 75 MG tablet TAKE 1 TABLET BY MOUTH DAILY 09/01/22   Gollan, Timothy J, MD  Colchicine  (MITIGARE ) 0.6 MG CAPS Take 0.6 mg  by mouth daily as needed. 03/05/20   Gollan, Timothy J, MD  Evolocumab  (REPATHA  SURECLICK) 140 MG/ML SOAJ Inject 140 mg into the skin every 14 (fourteen) days. 08/10/22   Gollan, Timothy J, MD  furosemide  (LASIX ) 20 MG tablet TAKE 1 TABLET BY MOUTH DAILY AS  NEEDED FOR FLUID (TAKE WITH  POTASSIUM) 03/31/22   Gollan, Timothy J, MD  gabapentin  (NEURONTIN ) 100 MG capsule Take 100-200 mg by mouth at bedtime as needed. Patient not taking: Reported on 03/31/2022 02/04/22   [provider]  hydrocortisone  cream 1 % Apply 1 Application topically 4 (four) times daily. 06/12/22   Margrette, Myah A, PA-C  hydrOXYzine  (ATARAX ) 25 MG tablet Take 1 tablet (25 mg  total) by mouth 3 (three) times daily. 03/31/22   Gollan, Timothy J, MD  isosorbide  mononitrate (IMDUR ) 30 MG 24 hr tablet Take 1 tablet (30 mg total) by mouth daily. 03/31/22   Gollan, Timothy J, MD  levETIRAcetam  (KEPPRA ) 250 MG tablet Take 1 tablet (250 mg total) by mouth 2 (two) times daily. 08/27/20   Amin, Sumayya, MD  levothyroxine  (SYNTHROID ) 100 MCG tablet Take 100 mcg by mouth daily. 09/17/21   [provider]  metoprolol  succinate (TOPROL -XL) 25 MG 24 hr tablet Take 1 tablet (25 mg total) by mouth daily. 03/31/22   Gollan, Timothy J, MD  montelukast  (SINGULAIR ) 10 MG tablet TAKE 1 TABLET(10 MG) BY MOUTH EVERY MORNING 02/19/22   Hester Alm BROCKS, MD  Multiple Vitamins-Minerals (ICAPS AREDS 2 PO) Take 1 tablet by mouth 2 (two) times daily.    [provider]  nitroGLYCERIN  (NITROSTAT ) 0.4 MG SL tablet Place 1 tablet (0.4 mg total) under the tongue every 5 (five) minutes as needed for chest pain. 09/30/21   Gollan, Timothy J, MD  potassium chloride  (KLOR-CON ) 10 MEQ tablet TAKE 1 TABLET BY MOUTH DAILY AS  NEEDED FOR NUTRIENT  REPLENISHMENT TAKE WITH  FUROSEMIDE  03/31/22   Gollan, Timothy J, MD  rosuvastatin  (CRESTOR ) 5 MG tablet TAKE 1 TABLET(5 MG) BY MOUTH DAILY 03/31/22   Gollan, Timothy J, MD  traZODone  (DESYREL ) 50 MG tablet Take 0.5  tablets (25 mg total) by mouth at bedtime as needed for sleep. Patient not taking: Reported on 03/31/2022 02/14/21   Josette Ade, MD  triamcinolone ointment (KENALOG) 0.1 % Apply topically 2 (two) times daily. Patient not taking: Reported on 03/31/2022 02/04/22   [provider]    Physical Exam: Vitals:   01/21/23 0423 01/21/23 0428  BP:  (!) 120/46  Pulse:  63  Resp:  18  Temp:  97.6 F (36.4 C)  TempSrc:  Oral  SpO2:  100%  Weight: 58.1 kg   Height: 5' (1.524 m)    Physical Exam Constitutional:      Appearance: She is normal weight.  HENT:     Head: Normocephalic and atraumatic.     Nose: Nose normal.     Mouth/Throat:     Mouth: Mucous membranes are dry.  Eyes:     Pupils: Pupils are equal, round, and reactive to light.  Cardiovascular:     Rate and Rhythm: Normal rate and regular rhythm.  Pulmonary:     Effort: Pulmonary effort is normal.  Abdominal:     General: Bowel sounds are normal.  Musculoskeletal:        General: Normal range of motion.  Skin:    General: Skin is warm and dry.  Neurological:     General: No focal deficit present.  Psychiatric:        Mood and Affect: Mood normal.     Data Reviewed:  There are no new results to review at this time.  DG Chest Port 1 View CLINICAL DATA:  Syncopal episode with hypotension.  EXAM: PORTABLE CHEST 1 VIEW  COMPARISON:  AP Lat chest 01/10/2023.  FINDINGS: The heart is slightly enlarged. No vascular congestion is seen. Stable mediastinum with aortic tortuosity, calcific plaque in the transverse segment. The lungs are mildly emphysematous but clear.  Osteopenia and thoracic spondylosis. Chronic rotator cuff arthropathy.  IMPRESSION: 1. No evidence of acute chest disease. Emphysema. Stable COPD chest. 2. Aortic atherosclerosis.  Electronically Signed   By: Francis Quam M.D.   On: 01/21/2023 05:39  CT Head Wo Contrast CLINICAL DATA:  Mental status changes, unknown cause.  EXAM: CT  HEAD WITHOUT CONTRAST  TECHNIQUE: Contiguous axial images were obtained from the base of the skull through the vertex without intravenous contrast.  RADIATION DOSE REDUCTION: This exam was performed according to the departmental dose-optimization program which includes automated exposure control, adjustment of the mA and/or kV according to patient size and/or use of iterative reconstruction technique.  COMPARISON:  Head CT and MRI brain both  02/12/2022  FINDINGS: Brain: There is moderately advanced cerebral and mild cerebellar atrophy, moderate atrophic ventriculomegaly and moderate to severe small vessel disease of the cerebral white matter.  No new asymmetry is seen concerning for a cortical based acute infarct, hemorrhage, mass or mass effect.  There are chronic lacunar infarcts in both external capsules. There is no midline shift. The basal cisterns are clear.  Vascular: There are calcifications in both siphons, both distal vertebral arteries. There are no hyperdense central vessels.  Skull: Negative for fractures or focal lesions.  Sinuses/Orbits: Trace chronic fluid in both mastoid tips. Clear paranasal sinuses with right-sided deviation and spurring of the nasal septum. Again noted old lens replacements with otherwise negative orbits.  Other: None.  IMPRESSION: 1. No acute intracranial CT findings or interval changes. 2. Atrophy and small-vessel disease. 3. Intracranial atherosclerosis.  Electronically Signed   By: Francis Quam M.D.   On: 01/21/2023 05:23  Lab Results  Component Value Date   WBC 6.4 01/21/2023   HGB 10.8 (L) 01/21/2023   HCT 34.4 (L) 01/21/2023   MCV 91.0 01/21/2023   PLT 188 01/21/2023   Last metabolic panel Lab Results  Component Value Date   GLUCOSE 158 (H) 01/21/2023   NA 142 01/21/2023   K 4.2 01/21/2023   CL 109 01/21/2023   CO2 19 (L) 01/21/2023   BUN 24 (H) 01/21/2023   CREATININE 0.94 01/21/2023   GFRNONAA 59 (L)  01/21/2023   CALCIUM  8.5 (L) 01/21/2023   PHOS 2.9 10/14/2021   PROT 6.0 (L) 01/21/2023   ALBUMIN 3.5 01/21/2023   BILITOT 0.7 01/21/2023   ALKPHOS 53 01/21/2023   AST 23 01/21/2023   ALT 13 01/21/2023   ANIONGAP 14 01/21/2023    Assessment and Plan: * Recurrent syncope Recurrent syncope x several months  Pt denies any palpitations, chest pain prior to event  Symptoms predominantly going from sitting to standing  Will hold offending medication for now including metoprolol   Orthostatics pending  Check 2D ECHO  Noted to have been followed by Dr. Gollan outpt for issues including near syncope and CAD  Consult cardiology as appropriate    Chronic diastolic CHF (congestive heart failure) (HCC) 2D ECHO 02/2022 w/ EF 60-65% and grade 1 DD  Mildly dry on exam  Wt 58 kg today  Gentle IVF hydration in setting of recurrent syncope  Repeat 2D ECHO pending  Monitor volume status closely   CAD (coronary artery disease) Baseline CAD  No active CP  Trop neg x1  EKG NSR  Cont asa and statin  Follow   Hypothyroidism Cont synthroid     Diabetes mellitus without complication (HCC) Blood sugar 150s SSI  A1C  Monitor  Essential hypertension Bp stable  Titrate regimen w/ recurrent syncope       Advance Care Planning:   Code Status: Full Code   Consults: None at present   Family Communication: No family at present  Severity of Illness: The appropriate patient status for this patient is OBSERVATION. Observation status  is judged to be reasonable and necessary in order to provide the required intensity of service to ensure the patient's safety. The patient's presenting symptoms, physical exam findings, and initial radiographic and laboratory data in the context of their medical condition is felt to place them at decreased risk for further clinical deterioration. Furthermore, it is anticipated that the patient will be medically stable for discharge from the hospital within 2  midnights of admission.   Author: Elspeth JINNY Masters, MD 01/21/2023 7:51 AM  For on call review [email or web address].

## 2023-01-21 NOTE — Assessment & Plan Note (Signed)
 Baseline CAD  No active CP  Trop neg x1  EKG NSR  Cont asa and statin  Follow

## 2023-01-21 NOTE — Assessment & Plan Note (Signed)
 2D ECHO 02/2022 w/ EF 60-65% and grade 1 DD  Mildly dry on exam  Wt 58 kg today  Gentle IVF hydration in setting of recurrent syncope  Repeat 2D ECHO pending  Monitor volume status closely

## 2023-01-21 NOTE — Progress Notes (Signed)
*  PRELIMINARY RESULTS* Echocardiogram 2D Echocardiogram has been performed.  [name] 01/21/2023, 10:37 AM

## 2023-01-21 NOTE — ED Triage Notes (Signed)
 Pt to ED via EMS from home with syncopal episode when she walked from bed to kitchen this morning. Patient had additional syncopal episode with EMS while sitting up for roughly 1 minute with a BP 88/40.   Hx of recent fall and HTN

## 2023-01-21 NOTE — Assessment & Plan Note (Signed)
 Cont synthroid

## 2023-01-22 DIAGNOSIS — R55 Syncope and collapse: Secondary | ICD-10-CM | POA: Diagnosis not present

## 2023-01-22 LAB — CBG MONITORING, ED
Glucose-Capillary: 119 mg/dL — ABNORMAL HIGH (ref 70–99)
Glucose-Capillary: 172 mg/dL — ABNORMAL HIGH (ref 70–99)

## 2023-01-22 NOTE — Care Management Obs Status (Signed)
 MEDICARE OBSERVATION STATUS NOTIFICATION   Patient Details  Name: [name] MRN: [id number] Date of Birth: 05/11/1933   Medicare Observation Status Notification Given:   No Patient eloped from the Emergency Department and MOON Letter was not given    Jon Cruel 01/22/2023, 10:43 AM

## 2023-01-22 NOTE — Care Management Important Message (Signed)
 Important Message  Patient Details  Name: [name] MRN: [id number] Date of Birth: 07/26/1933   Important Message Given:  No     Sherilyn Banker 01/22/2023, 10:48 AM

## 2023-01-22 NOTE — Discharge Summary (Signed)
 Physician Discharge Summary   Patient: [name] MRN: [id number] DOB: 18-Nov-1933  Admit date:     01/21/2023  Discharge date: 01/22/23  Discharge Physician: Burnard DELENA Cunning   PCP: Auston Reyes BIRCH, MD   Recommendations at discharge:    Follow up with Cardiology Recommend consideration of ambulatory heart monitoring for completion of syncope evaluation  Discharge Diagnoses: Principal Problem:   Recurrent syncope Active Problems:   Essential hypertension   Diabetes mellitus without complication (HCC)   Hypothyroidism   CAD (coronary artery disease)   Chronic diastolic CHF (congestive heart failure) (HCC)  Resolved Problems:   * No resolved hospital problems. [facility] Course: See H&P for HPI on admission.  Pt was admitted for observation and syncope evaluation.  I reached out to cardiology to request [employer] ambulatory heart monitor and intended to discharge patient later today.  However, patient eloped from the ED before I was able to round and see the patient.  Per nursing staff, patient did not sign AMA paperwork and did not tell them she was leaving.  Staff searched for patient in the restrooms and around the department but did not find her.     Assessment and Plan as per H&P: * Recurrent syncope Recurrent syncope x several months  Pt denies any palpitations, chest pain prior to event  Symptoms predominantly going from sitting to standing  Will hold offending medication for now including metoprolol   Orthostatics pending  Check 2D ECHO  Noted to have been followed by Dr. Gollan outpt for issues including near syncope and CAD  Consult cardiology as appropriate    Chronic diastolic CHF (congestive heart failure) (HCC) 2D ECHO 02/2022 w/ EF 60-65% and grade 1 DD  Mildly dry on exam  Wt 58 kg today  Gentle IVF hydration in setting of recurrent syncope  Repeat 2D ECHO pending  Monitor volume status closely   CAD (coronary artery disease) Baseline CAD   No active CP  Trop neg x1  EKG NSR  Cont asa and statin  Follow   Hypothyroidism Cont synthroid     Diabetes mellitus without complication (HCC) Blood sugar 150s SSI  A1C  Monitor  Essential hypertension Bp stable  Titrate regimen w/ recurrent syncope          Consultants: none Procedures performed:   Disposition: Home Diet recommendation: cardiac  DISCHARGE MEDICATION:  No Med Reconciliation completed due to patient eloping   Discharge Exam: Filed Weights   01/21/23 0423  Weight: 58.1 kg   No physical exam -- pt left hospital before being seen  Condition at discharge: unknown  The results of significant diagnostics from this hospitalization (including imaging, microbiology, ancillary and laboratory) are listed below for reference.   Imaging Studies: ECHOCARDIOGRAM COMPLETE Result Date: 01/21/2023    ECHOCARDIOGRAM REPORT   Patient Name:   [name] Date of Exam: 01/21/2023 Medical Rec #:  [id number]             Height:       60.0 in Accession #:    [id number]            Weight:       128.0 lb Date of Birth:  April 08, 1933             BSA:          1.544 m Patient Age:    89 years              BP:  120/46 mmHg Patient Gender: F                     HR:           61 bpm. Exam Location:  [facility] Procedure: 2D Echo, Cardiac Doppler, Color Doppler and Strain Analysis Indications:     Syncope  History:         Patient has prior history of Echocardiogram examinations, most                  recent 02/13/2022. Angina and CAD, Stroke,                  Signs/Symptoms:Syncope, Edema, Fatigue and Shortness of Breath;                  Risk Factors:Hypertension, Diabetes and Dyslipidemia.  Sonographer:     Naomie Reef Referring Phys:  [id number] ELSPETH PARAS NEWTON Diagnosing Phys: Evalene Lunger MD  Sonographer Comments: Global longitudinal strain was attempted. IMPRESSIONS  1. Left ventricular ejection fraction, by estimation, is 60 to 65%. The left ventricle has normal  function. The left ventricle has no regional wall motion abnormalities. Left ventricular diastolic parameters are consistent with Grade I diastolic dysfunction (impaired relaxation). The average left ventricular global longitudinal strain is -14.8 %.  2. Right ventricular systolic function is normal. The right ventricular size is normal. There is normal pulmonary artery systolic pressure. The estimated right ventricular systolic pressure is 23.6 mmHg.  3. Left atrial size was mild to moderately dilated.  4. The mitral valve is normal in structure. Mild to moderate mitral valve regurgitation. No evidence of mitral stenosis.  5. Tricuspid valve regurgitation is mild to moderate.  6. The aortic valve is tricuspid. Aortic valve regurgitation is not visualized. Aortic valve sclerosis is present, with no evidence of aortic valve stenosis.  7. The inferior vena cava is normal in size with greater than 50% respiratory variability, suggesting right atrial pressure of 3 mmHg. FINDINGS  Left Ventricle: Left ventricular ejection fraction, by estimation, is 60 to 65%. The left ventricle has normal function. The left ventricle has no regional wall motion abnormalities. The average left ventricular global longitudinal strain is -14.8 %. The left ventricular internal cavity size was normal in size. There is no left ventricular hypertrophy. Left ventricular diastolic parameters are consistent with Grade I diastolic dysfunction (impaired relaxation). Right Ventricle: The right ventricular size is normal. No increase in right ventricular wall thickness. Right ventricular systolic function is normal. There is normal pulmonary artery systolic pressure. The tricuspid regurgitant velocity is 2.27 m/s, and  with an assumed right atrial pressure of 3 mmHg, the estimated right ventricular systolic pressure is 23.6 mmHg. Left Atrium: Left atrial size was mild to moderately dilated. Right Atrium: Right atrial size was normal in size.  Pericardium: There is no evidence of pericardial effusion. Mitral Valve: The mitral valve is normal in structure. There is mild calcification of the mitral valve leaflet(s). Mild to moderate mitral valve regurgitation. No evidence of mitral valve stenosis. MV peak gradient, 6.2 mmHg. The mean mitral valve gradient is 3.0 mmHg. Tricuspid Valve: The tricuspid valve is normal in structure. Tricuspid valve regurgitation is mild to moderate. No evidence of tricuspid stenosis. Aortic Valve: The aortic valve is tricuspid. Aortic valve regurgitation is not visualized. Aortic valve sclerosis is present, with no evidence of aortic valve stenosis. Aortic valve mean gradient measures 5.0 mmHg. Aortic valve peak gradient measures 8.4  mmHg. Aortic valve area,  by VTI measures 2.04 cm. Pulmonic Valve: The pulmonic valve was normal in structure. Pulmonic valve regurgitation is mild. No evidence of pulmonic stenosis. Aorta: The aortic root is normal in size and structure. Venous: The inferior vena cava is normal in size with greater than 50% respiratory variability, suggesting right atrial pressure of 3 mmHg. IAS/Shunts: No atrial level shunt detected by color flow Doppler.  LEFT VENTRICLE PLAX 2D LVIDd:         4.30 cm   Diastology LVIDs:         2.70 cm   LV e' medial:    6.96 cm/s LV PW:         1.20 cm   LV E/e' medial:  13.9 LV IVS:        1.30 cm   LV e' lateral:   8.59 cm/s LVOT diam:     1.90 cm   LV E/e' lateral: 11.3 LV SV:         78 LV SV Index:   50        2D Longitudinal Strain LVOT Area:     2.84 cm  2D Strain GLS Avg:     -14.8 %  RIGHT VENTRICLE RV Basal diam:  3.35 cm RV Mid diam:    2.90 cm RV S prime:     12.20 cm/s TAPSE (M-mode): 2.2 cm LEFT ATRIUM             Index        RIGHT ATRIUM           Index LA diam:        4.00 cm 2.59 cm/m   RA Area:     14.50 cm LA Vol (A2C):   75.7 ml 49.03 ml/m  RA Volume:   34.50 ml  22.34 ml/m LA Vol (A4C):   49.2 ml 31.86 ml/m LA Biplane Vol: 65.3 ml 42.29 ml/m  AORTIC  VALVE                    PULMONIC VALVE AV Area (Vmax):    1.89 cm     PV Vmax:       1.10 m/s AV Area (Vmean):   1.97 cm     PV Peak grad:  4.8 mmHg AV Area (VTI):     2.04 cm AV Vmax:           145.00 cm/s AV Vmean:          98.400 cm/s AV VTI:            0.383 m AV Peak Grad:      8.4 mmHg AV Mean Grad:      5.0 mmHg LVOT Vmax:         96.60 cm/s LVOT Vmean:        68.200 cm/s LVOT VTI:          0.275 m LVOT/AV VTI ratio: 0.72  AORTA Ao Root diam: 3.10 cm MITRAL VALVE                TRICUSPID VALVE MV Area (PHT): 3.00 cm     TR Peak grad:   20.6 mmHg MV Area VTI:   1.65 cm     TR Vmax:        227.00 cm/s MV Peak grad:  6.2 mmHg MV Mean grad:  3.0 mmHg     SHUNTS MV Vmax:       1.24 m/s     Systemic VTI:  0.28 m MV Vmean:      73.0 cm/s    Systemic Diam: 1.90 cm MV Decel Time: 253 msec MV E velocity: 96.90 cm/s MV A velocity: 119.00 cm/s MV E/A ratio:  0.81 Evalene Lunger MD Electronically signed by Evalene Lunger MD Signature Date/Time: 01/21/2023/1:03:16 PM    Final    DG Chest Port 1 View Result Date: 01/21/2023 CLINICAL DATA:  Syncopal episode with hypotension. EXAM: PORTABLE CHEST 1 VIEW COMPARISON:  AP Lat chest 01/10/2023. FINDINGS: The heart is slightly enlarged. No vascular congestion is seen. Stable mediastinum with aortic tortuosity, calcific plaque in the transverse segment. The lungs are mildly emphysematous but clear. Osteopenia and thoracic spondylosis. Chronic rotator cuff arthropathy. IMPRESSION: 1. No evidence of acute chest disease. Emphysema. Stable COPD chest. 2. Aortic atherosclerosis. Electronically Signed   By: Francis Quam M.D.   On: 01/21/2023 05:39   CT Head Wo Contrast Result Date: 01/21/2023 CLINICAL DATA:  Mental status changes, unknown cause. EXAM: CT HEAD WITHOUT CONTRAST TECHNIQUE: Contiguous axial images were obtained from the base of the skull through the vertex without intravenous contrast. RADIATION DOSE REDUCTION: This exam was performed according to the departmental  dose-optimization program which includes automated exposure control, adjustment of the mA and/or kV according to patient size and/or use of iterative reconstruction technique. COMPARISON:  Head CT and MRI brain both 02/12/2022 FINDINGS: Brain: There is moderately advanced cerebral and mild cerebellar atrophy, moderate atrophic ventriculomegaly and moderate to severe small vessel disease of the cerebral white matter. No new asymmetry is seen concerning for a cortical based acute infarct, hemorrhage, mass or mass effect. There are chronic lacunar infarcts in both external capsules. There is no midline shift. The basal cisterns are clear. Vascular: There are calcifications in both siphons, both distal vertebral arteries. There are no hyperdense central vessels. Skull: Negative for fractures or focal lesions. Sinuses/Orbits: Trace chronic fluid in both mastoid tips. Clear paranasal sinuses with right-sided deviation and spurring of the nasal septum. Again noted old lens replacements with otherwise negative orbits. Other: None. IMPRESSION: 1. No acute intracranial CT findings or interval changes. 2. Atrophy and small-vessel disease. 3. Intracranial atherosclerosis. Electronically Signed   By: Francis Quam M.D.   On: 01/21/2023 05:23   DG Chest 2 View Result Date: 01/10/2023 CLINICAL DATA:  Chest pain. Not been taking metoprolol  but began taking it again 2 weeks ago. EXAM: CHEST - 2 VIEW COMPARISON:  01/17/2022 FINDINGS: Stable cardiomediastinal silhouette. Aortic atherosclerotic calcification. Bibasilar atelectasis. Otherwise no focal consolidation, pleural effusion, or pneumothorax. No displaced rib fractures. IMPRESSION: No acute cardiopulmonary disease. Electronically Signed   By: Norman Gatlin M.D.   On: 01/10/2023 20:45    Microbiology: Results for orders placed or performed during the hospital encounter of 01/21/23  Resp panel by RT-PCR (RSV, Flu A&B, Covid) Anterior Nasal Swab     Status: None    Collection Time: 01/21/23  4:26 AM   Specimen: Anterior Nasal Swab  Result Value Ref Range Status   SARS Coronavirus 2 by RT PCR NEGATIVE NEGATIVE Final    Comment: (NOTE) SARS-CoV-2 target nucleic acids are NOT DETECTED.  The SARS-CoV-2 RNA is generally detectable in upper respiratory specimens during the acute phase of infection. The lowest concentration of SARS-CoV-2 viral copies this assay can detect is 138 copies/mL. A negative result does not preclude SARS-Cov-2 infection and should not be used as the sole basis for treatment or other patient management decisions. A negative result may occur with  improper specimen collection/handling,  submission of specimen other than nasopharyngeal swab, presence of viral mutation(s) within the areas targeted by this assay, and inadequate number of viral copies(<138 copies/mL). A negative result must be combined with clinical observations, patient history, and epidemiological information. The expected result is Negative.  Fact Sheet for Patients:  bloggercourse.com  Fact Sheet for Healthcare Providers:  seriousbroker.it  This test is no t yet approved or cleared by the [place]  FDA and  has been authorized for detection and/or diagnosis of SARS-CoV-2 by FDA under an Emergency Use Authorization (EUA). This EUA will remain  in effect (meaning this test can be used) for the duration of the COVID-19 declaration under Section 564(b)(1) of the Act, 21 U.S.C.section 360bbb-3(b)(1), unless the authorization is terminated  or revoked sooner.       Influenza A by PCR NEGATIVE NEGATIVE Final   Influenza B by PCR NEGATIVE NEGATIVE Final    Comment: (NOTE) The Xpert Xpress SARS-CoV-2/FLU/RSV plus assay is intended as an aid in the diagnosis of influenza from Nasopharyngeal swab specimens and should not be used as a sole basis for treatment. Nasal washings and aspirates are unacceptable for  Xpert Xpress SARS-CoV-2/FLU/RSV testing.  Fact Sheet for Patients: bloggercourse.com  Fact Sheet for Healthcare Providers: seriousbroker.it  This test is not yet approved or cleared by the [place]  FDA and has been authorized for detection and/or diagnosis of SARS-CoV-2 by FDA under an Emergency Use Authorization (EUA). This EUA will remain in effect (meaning this test can be used) for the duration of the COVID-19 declaration under Section 564(b)(1) of the Act, 21 U.S.C. section 360bbb-3(b)(1), unless the authorization is terminated or revoked.     Resp Syncytial Virus by PCR NEGATIVE NEGATIVE Final    Comment: (NOTE) Fact Sheet for Patients: bloggercourse.com  Fact Sheet for Healthcare Providers: seriousbroker.it  This test is not yet approved or cleared by the [place]  FDA and has been authorized for detection and/or diagnosis of SARS-CoV-2 by FDA under an Emergency Use Authorization (EUA). This EUA will remain in effect (meaning this test can be used) for the duration of the COVID-19 declaration under Section 564(b)(1) of the Act, 21 U.S.C. section 360bbb-3(b)(1), unless the authorization is terminated or revoked.  Performed at [facility], [address] [place], [place] [address]   Culture, blood (routine x 2)     Status: None (Preliminary result)   Collection Time: 01/21/23  4:40 AM   Specimen: BLOOD  Result Value Ref Range Status   Specimen Description BLOOD LEFT AC  Final   Special Requests   Final    BOTTLES DRAWN AEROBIC AND ANAEROBIC Blood Culture results may not be optimal due to an inadequate volume of blood received in culture bottles   Culture   Final    NO GROWTH 1 DAY Performed at [facility], [address], [place], [place] [address]    Report Status PENDING  Incomplete  Culture, blood (routine x 2)     Status: None  (Preliminary result)   Collection Time: 01/21/23  4:54 AM   Specimen: BLOOD LEFT HAND  Result Value Ref Range Status   Specimen Description BLOOD LEFT HAND  Final   Special Requests   Final    BOTTLES DRAWN AEROBIC AND ANAEROBIC Blood Culture results may not be optimal due to an inadequate volume of blood received in culture bottles   Culture   Final    NO GROWTH 1 DAY Performed at [facility], [address] [place],  [place] [address]    Report Status PENDING  Incomplete    Labs: CBC: Recent Labs  Lab 01/21/23 0426  WBC 6.4  NEUTROABS 3.7  HGB 10.8*  HCT 34.4*  MCV 91.0  PLT 188   Basic Metabolic Panel: Recent Labs  Lab 01/21/23 0426  NA 142  K 4.2  CL 109  CO2 19*  GLUCOSE 158*  BUN 24*  CREATININE 0.94  CALCIUM  8.5*   Liver Function Tests: Recent Labs  Lab 01/21/23 0426  AST 23  ALT 13  ALKPHOS 53  BILITOT 0.7  PROT 6.0*  ALBUMIN 3.5   CBG: Recent Labs  Lab 01/21/23 1120 01/21/23 1638 01/21/23 2024 01/22/23 0633 01/22/23 0918  GLUCAP 90 95 130* 119* 172*    Discharge time spent: less than 30 minutes.  Signed: Burnard DELENA Cunning, DO [facility] 01/22/2023

## 2023-01-22 NOTE — Progress Notes (Addendum)
 Pt agitated and became irritable, she demanded to be discharged to be with her husband at home when it starts to snow.  At 10:15AM, pt was nowhere to be found. Left AMA with PIV in place and without signing AMA docs.

## 2023-01-22 NOTE — Progress Notes (Signed)
 1/10 Pt eloped from the Emergency Department and the MOON Letter was not discussed or given to the patient.

## 2023-01-26 LAB — CULTURE, BLOOD (ROUTINE X 2)
Culture: NO GROWTH
Culture: NO GROWTH

## 2023-02-10 ENCOUNTER — Ambulatory Visit: Payer: Medicare Other | Admitting: Dermatology

## 2023-02-14 ENCOUNTER — Other Ambulatory Visit: Payer: Self-pay

## 2023-02-14 ENCOUNTER — Emergency Department
Admission: EM | Admit: 2023-02-14 | Discharge: 2023-02-14 | Disposition: A | Discharge status: Home / Self Care | Payer: Medicare Other | Attending: Emergency Medicine | Admitting: Emergency Medicine

## 2023-02-14 DIAGNOSIS — I2510 Atherosclerotic heart disease of native coronary artery without angina pectoris: Secondary | ICD-10-CM | POA: Insufficient documentation

## 2023-02-14 DIAGNOSIS — R42 Dizziness and giddiness: Secondary | ICD-10-CM | POA: Insufficient documentation

## 2023-02-14 DIAGNOSIS — R55 Syncope and collapse: Secondary | ICD-10-CM | POA: Insufficient documentation

## 2023-02-14 LAB — BASIC METABOLIC PANEL
Anion gap: 12 (ref 5–15)
BUN: 23 mg/dL (ref 8–23)
CO2: 20 mmol/L — ABNORMAL LOW (ref 22–32)
Calcium: 8.7 mg/dL — ABNORMAL LOW (ref 8.9–10.3)
Chloride: 107 mmol/L (ref 98–111)
Creatinine, Ser: 1.08 mg/dL — ABNORMAL HIGH (ref 0.44–1.00)
GFR, Estimated: 50 mL/min — ABNORMAL LOW (ref >60)
Glucose, Bld: 196 mg/dL — ABNORMAL HIGH (ref 70–99)
Potassium: 3.9 mmol/L (ref 3.5–5.1)
Sodium: 139 mmol/L (ref 135–145)

## 2023-02-14 LAB — CBC
HCT: 33.8 % — ABNORMAL LOW (ref 36.0–46.0)
Hemoglobin: 11.1 g/dL — ABNORMAL LOW (ref 12.0–15.0)
MCH: 29.1 pg (ref 26.0–34.0)
MCHC: 32.8 g/dL (ref 30.0–36.0)
MCV: 88.5 fL (ref 80.0–100.0)
Platelets: 202 10*3/uL (ref 150–400)
RBC: 3.82 MIL/uL — ABNORMAL LOW (ref 3.87–5.11)
RDW: 14 % (ref 11.5–15.5)
WBC: 7.4 10*3/uL (ref 4.0–10.5)
nRBC: 0 % (ref 0.0–0.2)

## 2023-02-14 LAB — TROPONIN I (HIGH SENSITIVITY): Troponin I (High Sensitivity): 12 ng/L (ref <18)

## 2023-02-14 NOTE — ED Triage Notes (Addendum)
Pt comes with c/o near syncope. Pt states she was seen here early Jan for same issues. Pt states she feels like she is going to past out. Pt states she feels dizzy and sick feeling.   Pt states she does have seizures and did miss her two doses yesterday. Pt has taken her 1st dose today.

## 2023-02-14 NOTE — ED Provider Notes (Signed)
[facility] Provider Note    Event Date/Time   First MD Initiated Contact with Patient 02/14/23 1735     (approximate)   History   Near Syncope   HPI  [name] is a 89 y.o. female with a history of CAD, near syncopal episodes presents after a near syncopal episode.  Patient reports she has been much better now but she started to feel lightheaded like she was going to fall out.  She reports this has happened intermittently over the last 2 years.  She does see cardiology for this, no clear explanation.  Overall feels well now, no chest pain no palpitations no shortness of breath     Physical Exam   Triage Vital Signs: ED Triage Vitals  Encounter Vitals Group     BP 02/14/23 1541 (!) 173/79     Systolic BP Percentile --      Diastolic BP Percentile --      Pulse Rate 02/14/23 1541 86     Resp 02/14/23 1541 17     Temp 02/14/23 1541 98 F (36.7 C)     Temp src --      SpO2 02/14/23 1541 100 %     Weight 02/14/23 1542 59 kg (130 lb)     Height 02/14/23 1542 1.549 m (5\' 1" )     Head Circumference --      Peak Flow --      Pain Score 02/14/23 1542 0     Pain Loc --      Pain Education --      Exclude from Growth Chart --     Most recent vital signs: Vitals:   02/14/23 1541  BP: (!) 173/79  Pulse: 86  Resp: 17  Temp: 98 F (36.7 C)  SpO2: 100%     General: Awake, no distress.  CV:  Good peripheral perfusion.  Regular rate and rhythm Resp:  Normal effort.  Clear to auscultation bilaterally Abd:  No distention.  Soft, nontender Other:  No calf pain or swelling   ED Results / Procedures / Treatments   Labs (all labs ordered are listed, but only abnormal results are displayed) Labs Reviewed  BASIC METABOLIC PANEL - Abnormal; Notable for the following components:      Result Value   CO2 20 (*)    Glucose, Bld 196 (*)    Creatinine, Ser 1.08 (*)    Calcium 8.7 (*)    GFR, Estimated 50 (*)    All other components within  normal limits  CBC - Abnormal; Notable for the following components:   RBC 3.82 (*)    Hemoglobin 11.1 (*)    HCT 33.8 (*)    All other components within normal limits  URINALYSIS, W/ REFLEX TO CULTURE (INFECTION SUSPECTED)  CBG MONITORING, ED  TROPONIN I (HIGH SENSITIVITY)  TROPONIN I (HIGH SENSITIVITY)     EKG  ED ECG REPORT I, Jene Every, the attending physician, personally viewed and interpreted this ECG.  Date: 02/14/2023  Rhythm: normal sinus rhythm QRS Axis: normal Intervals: normal ST/T Wave abnormalities: normal Narrative Interpretation: no evidence of acute ischemia    RADIOLOGY     PROCEDURES:  Critical Care performed:   Procedures   MEDICATIONS ORDERED IN ED: Medications - No data to display   IMPRESSION / MDM / ASSESSMENT AND PLAN / ED COURSE  I reviewed the triage vital signs and the nursing notes. Patient's presentation is most consistent with severe exacerbation of  chronic illness.  Patient with a history of nursing. Presents after a near syncopal episode today.  No concerning red flag symptoms to differentiate this occurrence.  She was admitted recently for a syncopal episode but apparently left AMA because she became impatient with the time it took to get a room.  Overall well-appearing today, reassuring workup, labs EKG normal, she is asymptomatic here.  She would like to be discharged and I think this is reasonable, close follow-up with cardiology recommended        FINAL CLINICAL IMPRESSION(S) / ED DIAGNOSES   Final diagnoses:  Near syncope     Rx / DC Orders   ED Discharge Orders     None        Note:  This document was prepared using Dragon voice recognition software and may include unintentional dictation errors.   Jene Every, MD 02/14/23 [phone]

## 2023-02-14 NOTE — ED Provider Triage Note (Signed)
Emergency Medicine Provider Triage Evaluation Note  [name] , a 89 y.o. female  was evaluated in triage.  Pt complains of feeling lightheaded and nervous for the past 3 days.  She was seen for the similar complaint in January.  No chest pain or shortness of breath.  No palpitations.  Takes metoprolol.  Review of Systems  Positive: lightheaded Negative: CP, palpitations  Physical Exam  BP (!) 173/79   Pulse 86   Temp 98 F (36.7 C)   Resp 17   Ht 5\' 1"  (1.549 m)   Wt 59 kg   SpO2 100%   BMI 24.56 kg/m  Gen:   Awake, no distress   Resp:  Normal effort  MSK:   Moves extremities without difficulty  Other:    Medical Decision Making  Medically screening exam initiated at 3:42 PM.  Appropriate orders placed.  [name] was informed that the remainder of the evaluation will be completed by another provider, this initial triage assessment does not replace that evaluation, and the importance of remaining in the ED until their evaluation is complete.     Jackelyn Hoehn, PA-C 02/14/23 [phone]

## 2023-03-30 ENCOUNTER — Encounter: Payer: Self-pay | Admitting: Internal Medicine

## 2023-04-01 ENCOUNTER — Encounter: Payer: Self-pay | Admitting: Internal Medicine

## 2023-04-01 ENCOUNTER — Emergency Department

## 2023-04-01 ENCOUNTER — Encounter: Payer: Self-pay | Admitting: Intensive Care

## 2023-04-01 ENCOUNTER — Emergency Department
Admission: EM | Admit: 2023-04-01 | Discharge: 2023-04-01 | Discharge status: Against Medical Advice | Attending: Emergency Medicine | Admitting: Emergency Medicine

## 2023-04-01 ENCOUNTER — Other Ambulatory Visit: Payer: Self-pay

## 2023-04-01 ENCOUNTER — Other Ambulatory Visit: Payer: Self-pay | Admitting: Internal Medicine

## 2023-04-01 DIAGNOSIS — Z5321 Procedure and treatment not carried out due to patient leaving prior to being seen by health care provider: Secondary | ICD-10-CM | POA: Diagnosis not present

## 2023-04-01 DIAGNOSIS — W010XXA Fall on same level from slipping, tripping and stumbling without subsequent striking against object, initial encounter: Secondary | ICD-10-CM | POA: Insufficient documentation

## 2023-04-01 DIAGNOSIS — S0093XA Contusion of unspecified part of head, initial encounter: Secondary | ICD-10-CM | POA: Insufficient documentation

## 2023-04-01 DIAGNOSIS — M25552 Pain in left hip: Secondary | ICD-10-CM | POA: Diagnosis not present

## 2023-04-01 DIAGNOSIS — Y92130 Kitchen on military base as the place of occurrence of the external cause: Secondary | ICD-10-CM | POA: Diagnosis not present

## 2023-04-01 DIAGNOSIS — M25551 Pain in right hip: Secondary | ICD-10-CM | POA: Diagnosis not present

## 2023-04-01 DIAGNOSIS — Z7902 Long term (current) use of antithrombotics/antiplatelets: Secondary | ICD-10-CM | POA: Diagnosis not present

## 2023-04-01 DIAGNOSIS — M542 Cervicalgia: Secondary | ICD-10-CM | POA: Diagnosis not present

## 2023-04-01 DIAGNOSIS — S0990XA Unspecified injury of head, initial encounter: Secondary | ICD-10-CM | POA: Diagnosis present

## 2023-04-01 DIAGNOSIS — N6489 Other specified disorders of breast: Secondary | ICD-10-CM

## 2023-04-01 LAB — CBC WITH DIFFERENTIAL/PLATELET
Abs Immature Granulocytes: 0.02 10*3/uL (ref 0.00–0.07)
Basophils Absolute: 0 10*3/uL (ref 0.0–0.1)
Basophils Relative: 0 %
Eosinophils Absolute: 0.2 10*3/uL (ref 0.0–0.5)
Eosinophils Relative: 2 %
HCT: 35.9 % — ABNORMAL LOW (ref 36.0–46.0)
Hemoglobin: 11.8 g/dL — ABNORMAL LOW (ref 12.0–15.0)
Immature Granulocytes: 0 %
Lymphocytes Relative: 15 %
Lymphs Abs: 1.1 10*3/uL (ref 0.7–4.0)
MCH: 28.8 pg (ref 26.0–34.0)
MCHC: 32.9 g/dL (ref 30.0–36.0)
MCV: 87.6 fL (ref 80.0–100.0)
Monocytes Absolute: 0.5 10*3/uL (ref 0.1–1.0)
Monocytes Relative: 7 %
Neutro Abs: 5.2 10*3/uL (ref 1.7–7.7)
Neutrophils Relative %: 76 %
Platelets: 212 10*3/uL (ref 150–400)
RBC: 4.1 MIL/uL (ref 3.87–5.11)
RDW: 14.1 % (ref 11.5–15.5)
WBC: 7 10*3/uL (ref 4.0–10.5)
nRBC: 0 % (ref 0.0–0.2)

## 2023-04-01 LAB — COMPREHENSIVE METABOLIC PANEL
ALT: 13 U/L (ref 0–44)
AST: 25 U/L (ref 15–41)
Albumin: 3.9 g/dL (ref 3.5–5.0)
Alkaline Phosphatase: 58 U/L (ref 38–126)
Anion gap: 5 (ref 5–15)
BUN: 21 mg/dL (ref 8–23)
CO2: 24 mmol/L (ref 22–32)
Calcium: 9 mg/dL (ref 8.9–10.3)
Chloride: 111 mmol/L (ref 98–111)
Creatinine, Ser: 0.87 mg/dL (ref 0.44–1.00)
GFR, Estimated: >60 mL/min (ref >60)
Glucose, Bld: 102 mg/dL — ABNORMAL HIGH (ref 70–99)
Potassium: 4.3 mmol/L (ref 3.5–5.1)
Sodium: 140 mmol/L (ref 135–145)
Total Bilirubin: 0.6 mg/dL (ref 0.0–1.2)
Total Protein: 6.9 g/dL (ref 6.5–8.1)

## 2023-04-01 NOTE — ED Triage Notes (Addendum)
 First Nurse Note;  Pt via [facility] from home. Pt c/o mechanical fall in the kitchen. Pt has a small R sided hematoma. Pt does take Plavix. Pt c/o side of neck pain, bilateral hip pain. Pt is A&OX4 and NAD 205/75 BP  98% on RA 67 HR

## 2023-04-01 NOTE — ED Provider Triage Note (Signed)
 Emergency Medicine Provider Triage Evaluation Note  [name] , a 89 y.o. female  was evaluated in triage.  Pt complains of neck pain and headache after mechanical, non-syncopal fall at home after slipping in some water. No loss of consciousness.   Physical Exam  There were no vitals taken for this visit. Gen:   Awake, no distress   Resp:  Normal effort  MSK:   Moves extremities without difficulty  Other:    Medical Decision Making  Medically screening exam initiated at 3:31 PM.  Appropriate orders placed.  [name] was informed that the remainder of the evaluation will be completed by another provider, this initial triage assessment does not replace that evaluation, and the importance of remaining in the ED until their evaluation is complete.  CT cervical spine and head ordered.    [name] Pester, FNP 04/01/23 [phone]

## 2023-04-01 NOTE — ED Notes (Signed)
 No answer when called several times from lobby

## 2023-04-01 NOTE — ED Triage Notes (Signed)
 Patient arrived by [facility] from home after mechanical fall. Hematoma noted to right forehead and right arm hematoma. Denies LOC  Also c/o bilateral hip pain with right side being worse and neck pain. A&O x4 in triage  Takes plavix daily.

## 2023-04-04 NOTE — Progress Notes (Unsigned)
 Cardiology Office Note  Date:  04/05/2023   ID:  [name], [name], [name], MRN [id number]  PCP:  Marguarite Arbour, MD   Chief Complaint  Patient presents with   12 month follow up     Patient c/o chest pain that comes and goes. Patient c/o a near syncopal spell on Thursday, 04/01/2023; was seen in the ER at [facility] but left without treatment.     HPI:  [name] is a 89 y/o female with PMH of  coronary artery disease,  cath May 2016 revealing severe LAD and RCA disease,   Percutaneous intervention and drug-eluting stent placement was performed to  the right coronary artery.The LAD was also felt to be amenable for PCI but it was felt that she would require a Rotablator. performed later at [facility] Jun 01, 2014 Chronic debility, fall risk She presents today for routine follow-up for coronary artery disease, near-syncope, syncope  Last seen by myself in clinic 3/24  Episodes of syncope, near syncope, 5-6 epsiodes, "Started 2 years ago in June" "Seizures", vision change Lost license, husband has to drive No recent visits with neurology On Keppra twice daily  Last Thursday, syncope at home, landed on kitchen floor Reports no warning "Waited 9 hours in ER" and left  Seen by primary care December 2024, notes indicating she went to the emergency room after falling at [employer], became very weak while standing in line  Echocardiogram January 2025 Ejection fraction 60%  Reports cat scratch right hand, swelling, feels it is infected Hand is puffy and not getting better, requesting antibiotics Similar circumstances with primary care September 2024 At that time seen in the emergency room given Augmentin  After long discussion, she did not elaborate on her chest discomfort  EKG personally reviewed by myself on todays visit EKG Interpretation Date/Time:  Monday April 05 2023 [name]:59:18 EDT Ventricular Rate:  73 PR Interval:  162 QRS Duration:  94 QT  Interval:  416 QTC Calculation: 458 R Axis:   63  Text Interpretation: Normal sinus rhythm Nonspecific ST abnormality When compared with ECG of 05-Apr-2023 [name]:54, No significant change was found Confirmed by Julien Nordmann [phone]) on 04/05/2023 12:13:36 PM   Past medical history reviewed emergency room September 27, 2017 for chest pain Cardiac enzymes negative, other work-up benign  Carotid 2016 Findings consistent with 1-39 percent stenosis involving the right internal carotid artery and the left internal carotid artery.  Prior falls x 3 Mechanical, tripped on a mat, often stepping in the wrong place Very deconditioned, terrible gait instability, walks with a cane   Previous problems with gout, uric acid 8.1 On meloxican prn for pain  episode of garbled speech after PCI and MRI/A of the brain was performed and showed an acute, small, nonhemorrhagic infarct of the left caudate with moderate small vessel disease. She did not have any acute neurologic deficits.   Carotid ultrasound was performed and showed bilateral 1-39% internal carotid artery stenosis.    Thyroid nodule found, status post ablation, currently on thyroid supplementation This was performed March 2016  PMH:   has a past medical history of CAD (coronary artery disease), CAD (coronary artery disease) (02/11/2021), Carotid artery disease (HCC), Chronic anxiety, Diverticulosis, Esophagitis, GERD (gastroesophageal reflux disease), History of colon polyps, History of echocardiogram, Hyperlipidemia, Hypertension, Hypothyroidism, Mild reactive airways disease, Osteoarthritis, PONV (postoperative nausea and vomiting), Rheumatoid arthritis (HCC), Stroke (HCC), Thyroid cancer (HCC), and Type 2 diabetes mellitus (HCC).  PSH:    Past Surgical History:  Procedure Laterality Date   ABDOMINAL HYSTERECTOMY     APPENDECTOMY     AUGMENTATION MAMMAPLASTY     BACK SURGERY     BREAST IMPLANT REMOVAL  06/2001   CARDIAC CATHETERIZATION N/A  06/06/2014   Procedure: Coronary/Graft Atherectomy;  Surgeon: Iran Ouch, MD;  Location: [facility] INVASIVE CV LAB;  Service: Cardiovascular;  Laterality: N/A;   CARDIAC CATHETERIZATION N/A 05/31/2014   Procedure: Left Heart Cath;  Surgeon: Antonieta Iba, MD;  Location: [facility] INVASIVE CV LAB;  Service: Cardiovascular;  Laterality: N/A;   CARDIAC CATHETERIZATION N/A 05/31/2014   Procedure: Coronary Stent Intervention;  Surgeon: Iran Ouch, MD;  Location: [facility] INVASIVE CV LAB;  Service: Cardiovascular;  Laterality: N/A;   CARPAL TUNNEL RELEASE     "I've have a total of 7" (06/06/2014)   CATARACT EXTRACTION W/ INTRAOCULAR LENS  IMPLANT, BILATERAL Bilateral    CHOLECYSTECTOMY     CORONARY ANGIOPLASTY WITH STENT PLACEMENT  05/31/2014; 06/06/2014   "2; 1"   GANGLION CYST EXCISION Right    AC joint   LUMBAR LAMINECTOMY  X 6   MENISCUS REPAIR Bilateral    "2 on the right; 1 on the left"   ROTATOR CUFF REPAIR     "I think 2 left, 2 right"   THYROIDECTOMY     TONSILLECTOMY     TUBAL LIGATION      Current Outpatient Medications  Medication Sig Dispense Refill   acetaminophen (TYLENOL) 500 MG tablet Take 500-1,000 mg by mouth every 6 (six) hours as needed for mild pain or moderate pain.     albuterol (VENTOLIN HFA) 108 (90 Base) MCG/ACT inhaler Inhale 2 puffs into the lungs every 6 (six) hours as needed for wheezing or shortness of breath. 8 g 0   allopurinol (ZYLOPRIM) 100 MG tablet TAKE 1 TABLET(100 MG) BY MOUTH TWICE DAILY 60 tablet 3   clopidogrel (PLAVIX) 75 MG tablet TAKE 1 TABLET BY MOUTH DAILY 90 tablet 2   Colchicine (MITIGARE) 0.6 MG CAPS Take 0.6 mg by mouth daily as needed. 30 capsule 4   furosemide (LASIX) 20 MG tablet TAKE 1 TABLET BY MOUTH DAILY AS  NEEDED FOR FLUID (TAKE WITH  POTASSIUM) 90 tablet 1   hydrocortisone cream 1 % Apply 1 Application topically 4 (four) times daily. 30 g 0   hydrOXYzine (ATARAX) 25 MG tablet Take 1 tablet (25 mg total) by mouth 3 (three) times daily.  30 tablet 0   levETIRAcetam (KEPPRA) 250 MG tablet Take 1 tablet (250 mg total) by mouth 2 (two) times daily. 60 tablet 1   levothyroxine (SYNTHROID) 100 MCG tablet Take 100 mcg by mouth daily.     metoprolol succinate (TOPROL-XL) 25 MG 24 hr tablet Take 1 tablet (25 mg total) by mouth daily. 90 tablet 3   Multiple Vitamins-Minerals (ICAPS AREDS 2 PO) Take 1 tablet by mouth 2 (two) times daily.     nitroGLYCERIN (NITROSTAT) 0.4 MG SL tablet Place 1 tablet (0.4 mg total) under the tongue every 5 (five) minutes as needed for chest pain. 25 tablet 3   rosuvastatin (CRESTOR) 5 MG tablet TAKE 1 TABLET(5 MG) BY MOUTH DAILY 90 tablet 3   triamcinolone ointment (KENALOG) 0.1 % Apply topically 2 (two) times daily.     azithromycin (ZITHROMAX Z-PAK) 250 MG tablet 2 pills today then 1 pill a day for 4 days (Patient not taking: Reported on 01/22/2023) 6 each 0   Evolocumab (REPATHA SURECLICK) 140 MG/ML SOAJ Inject 140 mg into the  skin every 14 (fourteen) days. (Patient not taking: Reported on 01/21/2023) 6 mL 3   gabapentin (NEURONTIN) 100 MG capsule Take 100-200 mg by mouth at bedtime as needed. (Patient not taking: Reported on 03/31/2022)     montelukast (SINGULAIR) 10 MG tablet TAKE 1 TABLET(10 MG) BY MOUTH EVERY MORNING (Patient not taking: No sig reported) 90 tablet 0   potassium chloride (KLOR-CON) 10 MEQ tablet TAKE 1 TABLET BY MOUTH DAILY AS  NEEDED FOR NUTRIENT  REPLENISHMENT TAKE WITH  FUROSEMIDE (Patient not taking: Reported on 01/22/2023) 90 tablet 3   traZODone (DESYREL) 50 MG tablet Take 0.5 tablets (25 mg total) by mouth at bedtime as needed for sleep. (Patient not taking: Reported on 04/05/2023) 15 tablet 0   No current facility-administered medications for this visit.   Allergies:   Ciprofloxacin, Codeine, Metronidazole, Other, Petrolatum-zinc oxide, Sulfa antibiotics, Neomycin-bacitracin zn-polymyx, Bacitracin-polymyxin b, Celecoxib, Neomycin, Statins, and Vioxx [rofecoxib]   Social History:  The  patient  reports that she has never smoked. She has never used smokeless tobacco. She reports that she does not drink alcohol and does not use drugs.   Family History:   family history includes Heart disease in her sister.   Review of Systems: Review of Systems  Constitutional: Negative.   HENT: Negative.    Respiratory: Negative.    Cardiovascular: Negative.   Gastrointestinal: Negative.   Musculoskeletal: Negative.   Neurological: Negative.   Psychiatric/Behavioral: Negative.    All other systems reviewed and are negative.  PHYSICAL EXAM: VS:  BP (!) 160/70 (BP Location: Left Arm, Patient Position: Sitting, Cuff Size: Normal)   Pulse 73   Ht 5\' 1"  (1.549 m)   Wt 130 lb (59 kg)   SpO2 92%   BMI 24.56 kg/m  , BMI Body mass index is 24.56 kg/m. Constitutional:  oriented to person, place, and time. No distress.  HENT:  Head: Grossly normal Eyes:  no discharge. No scleral icterus.  Neck: No JVD, no carotid bruits  Cardiovascular: Regular rate and rhythm, no murmurs appreciated Pulmonary/Chest: Clear to auscultation bilaterally, no wheezes or rails Abdominal: Soft.  no distension.  no tenderness.  Musculoskeletal: Normal range of motion Neurological:  normal muscle tone. Coordination normal. No atrophy Skin: Skin warm and dry Psychiatric: normal affect, pleasant   Recent Labs: 01/21/2023: TSH 0.484 04/01/2023: ALT 13; BUN 21; Creatinine, Ser 0.87; Hemoglobin [name].8; Platelets 212; Potassium 4.3; Sodium 140    Lipid Panel Lab Results  Component Value Date   CHOL 195 02/13/2022   HDL 65 02/13/2022   LDLCALC 84 02/13/2022   TRIG 229 (H) 02/13/2022      Wt Readings from Last 3 Encounters:  04/05/23 130 lb (59 kg)  04/01/23 130 lb (59 kg)  02/14/23 130 lb (59 kg)       ASSESSMENT AND PLAN:  Near syncope Reports having recent episodes that she describes as "seizures" 5-6 episodes over the past 2 to 3 years Seen by primary care December 2024, at that time for legs  giving out while at [employer] Blood pressure elevated, less likely orthostasis Zio monitor ordered to rule out arrhythmia Recommend she discuss further with neurology  Coronary artery disease involving native coronary artery of native heart without angina pectoris Currently with no symptoms of angina. No further workup at this time. Continue current medication regimen. Continue aspirin, statin, beta-blocker,  Previously recommended she restart Zetia  Mixed hyperlipidemia Continue Crestor  On last clinic visit we recommended she restart Zetia  Essential hypertension Blood  pressure elevated on today's visit Commend she closely monitor blood pressure at home We will hold off on additional changes given what she describes as frequent near-syncope, syncope episodes  Falls Several prior falls, on today's visit she prescribed these as " seizures", " passed outs" Walks with a walker  Type 2 diabetes mellitus with other circulatory complication, without long-term current use of insulin (HCC)  hemoglobin A1c 6.3 Managed by primary care  Anemia chronic issue, stable hemoglobin [name].2 Monitored by primary care    Orders Placed This Encounter  Procedures   EKG 12-Lead   EKG 12-Lead     Signed, Dossie Arbour, M.D., Ph.D. 04/05/2023  [facility] [place], [place] [phone] \

## 2023-04-05 ENCOUNTER — Ambulatory Visit

## 2023-04-05 ENCOUNTER — Ambulatory Visit: Payer: Medicare Other | Attending: Cardiovascular Disease | Admitting: Cardiovascular Disease

## 2023-04-05 VITALS — BP 160/70 | HR 73 | Ht 61.0 in | Wt 130.0 lb

## 2023-04-05 DIAGNOSIS — I5032 Chronic diastolic (congestive) heart failure: Secondary | ICD-10-CM

## 2023-04-05 DIAGNOSIS — I739 Peripheral vascular disease, unspecified: Secondary | ICD-10-CM | POA: Diagnosis not present

## 2023-04-05 DIAGNOSIS — R55 Syncope and collapse: Secondary | ICD-10-CM

## 2023-04-05 DIAGNOSIS — I639 Cerebral infarction, unspecified: Secondary | ICD-10-CM

## 2023-04-05 DIAGNOSIS — I6523 Occlusion and stenosis of bilateral carotid arteries: Secondary | ICD-10-CM | POA: Diagnosis not present

## 2023-04-05 DIAGNOSIS — E782 Mixed hyperlipidemia: Secondary | ICD-10-CM

## 2023-04-05 DIAGNOSIS — I25118 Atherosclerotic heart disease of native coronary artery with other forms of angina pectoris: Secondary | ICD-10-CM

## 2023-04-05 DIAGNOSIS — E1159 Type 2 diabetes mellitus with other circulatory complications: Secondary | ICD-10-CM

## 2023-04-05 DIAGNOSIS — I10 Essential (primary) hypertension: Secondary | ICD-10-CM

## 2023-04-05 MED ORDER — ROSUVASTATIN CALCIUM 5 MG PO TABS: ORAL_TABLET | ORAL | 3 refills | Status: AC

## 2023-04-05 MED ORDER — METOPROLOL SUCCINATE ER 25 MG PO TB24: 25.0000 mg | ORAL_TABLET | Freq: Every day | ORAL | 3 refills | Status: DC

## 2023-04-05 MED ORDER — ROSUVASTATIN CALCIUM 5 MG PO TABS: ORAL_TABLET | ORAL | 3 refills | Status: DC

## 2023-04-05 MED ORDER — METOPROLOL SUCCINATE ER 25 MG PO TB24: 25.0000 mg | ORAL_TABLET | Freq: Every day | ORAL | 3 refills | Status: AC

## 2023-04-05 MED ORDER — CEPHALEXIN 500 MG PO CAPS
500.0000 mg | ORAL_CAPSULE | Freq: Four times a day (QID) | ORAL | 0 refills | Status: DC
Start: 2023-04-05 — End: 2023-04-07

## 2023-04-05 NOTE — Patient Instructions (Addendum)
 Medication Instructions:  Keflex 500 mg      every 6 hrs for cellulitis, for 7 days  If you need a refill on your cardiac medications before your next appointment, please call your pharmacy.   Lab work: No new labs needed  Testing/Procedures:  ZIO XT- Long Term Monitor Instructions  Your physician has requested you wear a ZIO patch monitor for 14 days.  This is a single patch monitor. [employer] supplies one patch monitor per enrollment. Additional stickers are not available. Please do not apply patch if you will be having a Nuclear Stress Test,  Echocardiogram, Cardiac CT, MRI, or Chest Xray during the period you would be wearing the  monitor. The patch cannot be worn during these tests. You cannot remove and re-apply the  ZIO XT patch monitor.  Your ZIO patch monitor will be mailed 3 day USPS to your address on file. It may take 3-5 days  to receive your monitor after you have been enrolled.  Once you have received your monitor, please review the enclosed instructions. Your monitor  has already been registered assigning a specific monitor serial # to you.  Billing and Patient Assistance Program Information  We have supplied [employer] with any of your insurance information on file for billing purposes. [employer] offers a sliding scale Patient Assistance Program for patients that do not have  insurance, or whose insurance does not completely cover the cost of the ZIO monitor.  You must apply for the Patient Assistance Program to qualify for this discounted rate.  To apply, please call [employer] at [phone], select option 4, select option 2, ask to apply for  Patient Assistance Program. Meredeth Ide will ask your household income, and how many people  are in your household. They will quote your out-of-pocket cost based on that information.  [employer] will also be able to set up a 27-month, interest-free payment plan if needed.  Applying the monitor   Shave hair from upper left chest.  Hold  abrader disc by orange tab. Rub abrader in 40 strokes over the upper left chest as  indicated in your monitor instructions.  Clean area with 4 enclosed alcohol pads. Let dry.  Apply patch as indicated in monitor instructions. Patch will be placed under collarbone on left  side of chest with arrow pointing upward.  Rub patch adhesive wings for 2 minutes. Remove white label marked "1". Remove the white  label marked "2". Rub patch adhesive wings for 2 additional minutes.  While looking in a mirror, press and release button in center of patch. A small green light will  flash 3-4 times. This will be your only indicator that the monitor has been turned on.  Do not shower for the first 24 hours. You may shower after the first 24 hours.  Press the button if you feel a symptom. You will hear a small click. Record Date, Time and  Symptom in the Patient Logbook.  When you are ready to remove the patch, follow instructions on the last 2 pages of Patient  Logbook. Stick patch monitor onto the last page of Patient Logbook.  Place Patient Logbook in the blue and white box. Use locking tab on box and tape box closed  securely. The blue and white box has prepaid postage on it. Please place it in the mailbox as  soon as possible. Your physician should have your test results approximately 7 days after the  monitor has been mailed back to [facility].  Call [employer]  Care at [phone] if you have questions regarding  your ZIO XT patch monitor. Call them immediately if you see an orange light blinking on your  monitor.  If your monitor falls off in less than 4 days, contact our Monitor department at [phone].  If your monitor becomes loose or falls off after 4 days call [employer] at [phone] for  suggestions on securing your monitor   Follow-Up: At [facility], you and your health needs are our priority.  As part of our continuing mission to provide you with exceptional heart  care, we have created designated Provider Care Teams.  These Care Teams include your primary Cardiologist (physician) and Advanced Practice Providers (APPs -  Physician Assistants and Nurse Practitioners) who all work together to provide you with the care you need, when you need it.  You will need a follow up appointment in 6 months, APP ok   Providers on your designated Care Team:   Nicolasa Ducking, NP Eula Listen, PA-C Cadence Fransico Michael, [place]  COVID-19 Vaccine Information can be found at: [email or web address] For questions related to vaccine distribution or appointments, please email [email or web address] or call [phone].

## 2023-04-07 ENCOUNTER — Emergency Department

## 2023-04-07 ENCOUNTER — Other Ambulatory Visit: Payer: Self-pay

## 2023-04-07 ENCOUNTER — Encounter: Payer: Self-pay | Admitting: Intensive Care

## 2023-04-07 ENCOUNTER — Emergency Department
Admission: EM | Admit: 2023-04-07 | Discharge: 2023-04-07 | Disposition: A | Discharge status: Home / Self Care | Attending: Emergency Medicine | Admitting: Emergency Medicine

## 2023-04-07 DIAGNOSIS — E119 Type 2 diabetes mellitus without complications: Secondary | ICD-10-CM | POA: Insufficient documentation

## 2023-04-07 DIAGNOSIS — W5503XA Scratched by cat, initial encounter: Secondary | ICD-10-CM | POA: Diagnosis not present

## 2023-04-07 DIAGNOSIS — I10 Essential (primary) hypertension: Secondary | ICD-10-CM | POA: Insufficient documentation

## 2023-04-07 DIAGNOSIS — E039 Hypothyroidism, unspecified: Secondary | ICD-10-CM | POA: Diagnosis not present

## 2023-04-07 DIAGNOSIS — S60511A Abrasion of right hand, initial encounter: Secondary | ICD-10-CM | POA: Diagnosis present

## 2023-04-07 MED ORDER — AZITHROMYCIN 250 MG PO TABS: ORAL_TABLET | ORAL | 0 refills | Status: AC

## 2023-04-07 NOTE — ED Provider Notes (Signed)
 [facility] Provider Note    Event Date/Time   First MD Initiated Contact with Patient 04/07/23 1601     (approximate)   History   Animal Bite   HPI  [name] is a 89 y.o. female with history of hypertension, hyperlipidemia, hypothyroidism, diabetes and RA/OA presents emergency department with concerns of a cat scratch to the right hand.  States that hand became very swollen after the scratch.  Incident happened today.  Tdap is up-to-date.      Physical Exam   Triage Vital Signs: ED Triage Vitals  Encounter Vitals Group     BP 04/07/23 1511 130/88     Systolic BP Percentile --      Diastolic BP Percentile --      Pulse Rate 04/07/23 1511 67     Resp 04/07/23 1511 16     Temp 04/07/23 1514 98 F (36.7 C)     Temp Source 04/07/23 1514 Oral     SpO2 04/07/23 1511 100 %     Weight 04/07/23 1513 130 lb (59 kg)     Height 04/07/23 1513 5\' 1"  (1.549 m)     Head Circumference --      Peak Flow --      Pain Score 04/07/23 1513 4     Pain Loc --      Pain Education --      Exclude from Growth Chart --     Most recent vital signs: Vitals:   04/07/23 1511 04/07/23 1514  BP: 130/88   Pulse: 67   Resp: 16   Temp:  98 F (36.7 C)  SpO2: 100%      General: Awake, no distress.   CV:  Good peripheral perfusion. regular rate and  rhythm Resp:  Normal effort.  Abd:  No distention.   Other:  Right hand with open wound noted to the dorsum, no active bleeding, area is tender to palpation   ED Results / Procedures / Treatments   Labs (all labs ordered are listed, but only abnormal results are displayed) Labs Reviewed - No data to display   EKG     RADIOLOGY X-ray of the right hand    PROCEDURES:   Procedures Chief Complaint  Patient presents with   Animal Bite      MEDICATIONS ORDERED IN ED: Medications - No data to display   IMPRESSION / MDM / ASSESSMENT AND PLAN / ED COURSE  I reviewed the triage vital  signs and the nursing notes.                              Differential diagnosis includes, but is not limited to, cat scratch, cellulitis, fracture, foreign body  Patient's presentation is most consistent with acute illness / injury with system symptoms.   X-ray of the right hand was independently reviewed interpreted by me as being negative for fracture or foreign body, pending radiology read  I did explain findings patient.  Will place her on Zithromax since it is a cat scratch.  Stops Keflex that she is currently on.  Follow-up with her regular doctor if not improving 3 days.  Return if worsening.  Patient is in agreement treatment plan.  Discharged stable condition.      FINAL CLINICAL IMPRESSION(S) / ED DIAGNOSES   Final diagnoses:  Cat scratch     Rx / DC Orders   ED Discharge Orders  Ordered    azithromycin (ZITHROMAX Z-PAK) 250 MG tablet        04/07/23 1701             Note:  This document was prepared using Dragon voice recognition software and may include unintentional dictation errors.    Faythe Ghee, PA-C 04/07/23 1704    Jene Every, MD 04/07/23 [phone]

## 2023-04-07 NOTE — Discharge Instructions (Signed)
 Follow-up with your regular doctor if not improving 1 week.  Return if worsening.  Take medication as prescribed.  You should decrease your colchicine while taking this medication

## 2023-04-07 NOTE — ED Triage Notes (Signed)
 Patient has cat scratches on her right hand from today. Patient reports cat has all updated shots. Right hand is swollen

## 2023-04-26 ENCOUNTER — Emergency Department
Admission: EM | Admit: 2023-04-26 | Discharge: 2023-04-26 | Disposition: A | Discharge status: Home / Self Care | Attending: Emergency Medicine | Admitting: Emergency Medicine

## 2023-04-26 ENCOUNTER — Other Ambulatory Visit: Payer: Self-pay

## 2023-04-26 ENCOUNTER — Emergency Department

## 2023-04-26 DIAGNOSIS — H538 Other visual disturbances: Secondary | ICD-10-CM | POA: Insufficient documentation

## 2023-04-26 DIAGNOSIS — H5712 Ocular pain, left eye: Secondary | ICD-10-CM | POA: Diagnosis present

## 2023-04-26 DIAGNOSIS — I110 Hypertensive heart disease with heart failure: Secondary | ICD-10-CM | POA: Diagnosis not present

## 2023-04-26 DIAGNOSIS — Z8673 Personal history of transient ischemic attack (TIA), and cerebral infarction without residual deficits: Secondary | ICD-10-CM | POA: Insufficient documentation

## 2023-04-26 DIAGNOSIS — I509 Heart failure, unspecified: Secondary | ICD-10-CM | POA: Insufficient documentation

## 2023-04-26 DIAGNOSIS — I2510 Atherosclerotic heart disease of native coronary artery without angina pectoris: Secondary | ICD-10-CM | POA: Insufficient documentation

## 2023-04-26 DIAGNOSIS — E119 Type 2 diabetes mellitus without complications: Secondary | ICD-10-CM | POA: Diagnosis not present

## 2023-04-26 LAB — COMPREHENSIVE METABOLIC PANEL WITH GFR
ALT: 17 U/L (ref 0–44)
AST: 24 U/L (ref 15–41)
Albumin: 3.9 g/dL (ref 3.5–5.0)
Alkaline Phosphatase: 53 U/L (ref 38–126)
Anion gap: 8 (ref 5–15)
BUN: 26 mg/dL — ABNORMAL HIGH (ref 8–23)
CO2: 23 mmol/L (ref 22–32)
Calcium: 8.8 mg/dL — ABNORMAL LOW (ref 8.9–10.3)
Chloride: 109 mmol/L (ref 98–111)
Creatinine, Ser: 1.08 mg/dL — ABNORMAL HIGH (ref 0.44–1.00)
GFR, Estimated: 50 mL/min — ABNORMAL LOW (ref >60)
Glucose, Bld: 138 mg/dL — ABNORMAL HIGH (ref 70–99)
Potassium: 3.9 mmol/L (ref 3.5–5.1)
Sodium: 140 mmol/L (ref 135–145)
Total Bilirubin: 0.5 mg/dL (ref 0.0–1.2)
Total Protein: 6.5 g/dL (ref 6.5–8.1)

## 2023-04-26 LAB — CBC WITH DIFFERENTIAL/PLATELET
Abs Immature Granulocytes: 0.01 10*3/uL (ref 0.00–0.07)
Basophils Absolute: 0 10*3/uL (ref 0.0–0.1)
Basophils Relative: 0 %
Eosinophils Absolute: 0.3 10*3/uL (ref 0.0–0.5)
Eosinophils Relative: 6 %
HCT: 34.6 % — ABNORMAL LOW (ref 36.0–46.0)
Hemoglobin: 11.1 g/dL — ABNORMAL LOW (ref 12.0–15.0)
Immature Granulocytes: 0 %
Lymphocytes Relative: 18 %
Lymphs Abs: 1 10*3/uL (ref 0.7–4.0)
MCH: 29.3 pg (ref 26.0–34.0)
MCHC: 32.1 g/dL (ref 30.0–36.0)
MCV: 91.3 fL (ref 80.0–100.0)
Monocytes Absolute: 0.4 10*3/uL (ref 0.1–1.0)
Monocytes Relative: 7 %
Neutro Abs: 3.7 10*3/uL (ref 1.7–7.7)
Neutrophils Relative %: 69 %
Platelets: 220 10*3/uL (ref 150–400)
RBC: 3.79 MIL/uL — ABNORMAL LOW (ref 3.87–5.11)
RDW: 14.6 % (ref 11.5–15.5)
WBC: 5.4 10*3/uL (ref 4.0–10.5)
nRBC: 0 % (ref 0.0–0.2)

## 2023-04-26 LAB — PROTIME-INR
INR: 1 (ref 0.8–1.2)
Prothrombin Time: 13.9 s (ref 11.4–15.2)

## 2023-04-26 LAB — SEDIMENTATION RATE: Sed Rate: 23 mm/h (ref 0–30)

## 2023-04-26 LAB — APTT: aPTT: 28 s (ref 24–36)

## 2023-04-26 LAB — C-REACTIVE PROTEIN: CRP: 1.1 mg/dL — ABNORMAL HIGH (ref <1.0)

## 2023-04-26 MED ORDER — TETRACAINE HCL 0.5 % OP SOLN
2.0000 [drp] | Freq: Once | OPHTHALMIC | Status: AC
  Administered 2023-04-26: 2 [drp] via OPHTHALMIC
  Filled 2023-04-26: qty 4

## 2023-04-26 MED ORDER — FLUORESCEIN SODIUM 1 MG OP STRP
1.0000 | ORAL_STRIP | Freq: Once | OPHTHALMIC | Status: AC
  Administered 2023-04-26: 1 via OPHTHALMIC
  Filled 2023-04-26: qty 1

## 2023-04-26 NOTE — ED Provider Notes (Signed)
 [facility] Provider Note    Event Date/Time   First MD Initiated Contact with Patient 04/26/23 2021     (approximate)   History   Chief Complaint Eye Pain   HPI  [name] is a 89 y.o. female with a past medical history of hypertension, hyperlipidemia, diabetes, CAD, CHF, stroke, seizures, and anxiety who presents to the ED complaining of eye pain.  Patient reports that she had sudden onset sharp pain in her left eye about an hour prior to arrival which was associated with some blurriness in the vision of her left eye.  She does state that she has had similar symptoms happen in the past before a seizure, but she did not have any seizure activity today.  She denies any associated headache, fever, speech changes, numbness, or weakness.  At the time of my evaluation, patient states that eye pain has resolved and her vision is back to her baseline.  She reports dealing with chronic blurry vision in both eyes due to macular degeneration, follows with [facility] for this.     Physical Exam   Triage Vital Signs: ED Triage Vitals  Encounter Vitals Group     BP 04/26/23 1837 (!) 196/73     Systolic BP Percentile --      Diastolic BP Percentile --      Pulse Rate 04/26/23 1837 79     Resp 04/26/23 1837 17     Temp 04/26/23 1837 98.3 F (36.8 C)     Temp Source 04/26/23 1837 Oral     SpO2 04/26/23 1837 98 %     Weight 04/26/23 1838 135 lb (61.2 kg)     Height 04/26/23 1838 5\' 1"  (1.549 m)     Head Circumference --      Peak Flow --      Pain Score 04/26/23 1837 5     Pain Loc --      Pain Education --      Exclude from Growth Chart --     Most recent vital signs: Vitals:   04/26/23 1837 04/26/23 2200  BP: (!) 196/73 (!) 178/75  Pulse: 79 77  Resp: 17 18  Temp: 98.3 F (36.8 C)   SpO2: 98% 99%    Constitutional: Alert and oriented. Eyes: Conjunctivae are normal.  Pupils equal, round, and reactive to light bilaterally.   Extraocular movements intact.  No fluorescein uptake noted. Head: Atraumatic.  No temporal tenderness. Nose: No congestion/rhinnorhea. Mouth/Throat: Mucous membranes are moist.  Cardiovascular: Normal rate, regular rhythm. Grossly normal heart sounds.  2+ radial pulses bilaterally. Respiratory: Normal respiratory effort.  No retractions. Lungs CTAB. Gastrointestinal: Soft and nontender. No distention. Musculoskeletal: No lower extremity tenderness nor edema.  Neurologic:  Normal speech and language. No gross focal neurologic deficits are appreciated.    ED Results / Procedures / Treatments   Labs (all labs ordered are listed, but only abnormal results are displayed) Labs Reviewed  CBC WITH DIFFERENTIAL/PLATELET - Abnormal; Notable for the following components:      Result Value   RBC 3.79 (*)    Hemoglobin 11.1 (*)    HCT 34.6 (*)    All other components within normal limits  COMPREHENSIVE METABOLIC PANEL WITH GFR - Abnormal; Notable for the following components:   Glucose, Bld 138 (*)    BUN 26 (*)    Creatinine, Ser 1.08 (*)    Calcium 8.8 (*)    GFR, Estimated 50 (*)  All other components within normal limits  APTT  PROTIME-INR  SEDIMENTATION RATE  C-REACTIVE PROTEIN     EKG  ED ECG REPORT I, Twilla Galea, the attending physician, personally viewed and interpreted this ECG.   Date: 04/26/2023  EKG Time: 18:44  Rate: 79  Rhythm: normal sinus rhythm  Axis: Normal  Intervals:right bundle branch block  ST&T Change: None  RADIOLOGY CT head reviewed and interpreted by me with no hemorrhage or midline shift.  PROCEDURES:  Critical Care performed: No  Procedures   MEDICATIONS ORDERED IN ED: Medications  fluorescein ophthalmic strip 1 strip (1 strip Both Eyes Given 04/26/23 2050)  tetracaine (PONTOCAINE) 0.5 % ophthalmic solution 2 drop (2 drops Both Eyes Given 04/26/23 2050)     IMPRESSION / MDM / ASSESSMENT AND PLAN / ED COURSE  I reviewed the triage  vital signs and the nursing notes.                              89 y.o. female with past medical history of hypertension, hyperlipidemia, diabetes, CAD, stroke, CHF, seizures, and anxiety who presents to the ED complaining of sudden onset left eye pain with blurry vision that has since resolved.  Patient's presentation is most consistent with acute presentation with potential threat to life or bodily function.  Differential diagnosis includes, but is not limited to, stroke, TIA, temporal arteritis, glaucoma, corneal abrasion, iritis.  Patient nontoxic-appearing and in no acute distress, vital signs are unremarkable.  She has no focal neurologic deficits on exam and examination is unremarkable.  IOP noted to be 25 in the left eye and 22 in the right eye, no foreseen uptake noted with Brooks Cao lamp examination.  She has no temporal tenderness and ESR within normal limits, doubt temporal arteritis.  CT head is negative for acute process and labs are reassuring with renal function stable compared to previous, no acute electrolyte abnormality, anemia, or leukocytosis noted.  LFTs are also unremarkable.  Patient symptoms have resolved and she states that her vision is currently at her baseline, given reassuring workup she is appropriate for outpatient follow-up with her ophthalmologist, was encouraged to call them first thing in the morning.  She was counseled to return to the ED for new or worsening symptoms, patient agrees with plan.      FINAL CLINICAL IMPRESSION(S) / ED DIAGNOSES   Final diagnoses:  Pain of left eye     Rx / DC Orders   ED Discharge Orders     None        Note:  This document was prepared using Dragon voice recognition software and may include unintentional dictation errors.   Twilla Galea, MD 04/26/23 2217

## 2023-04-26 NOTE — ED Provider Triage Note (Signed)
 Emergency Medicine Provider Triage Evaluation Note  [name] , a 89 y.o. female  was evaluated in triage.  Pt complains of left eye pain and decreased vision out of the left eye. Describes vision as a little blurry.   Spoke with Dr. Cam [name] who recommended triage orders.  Review of Systems  Positive: Blurry vision left eye Negative:   Physical Exam  There were no vitals taken for this visit. Gen:   Awake, no distress   Resp:  Normal effort  MSK:   Moves extremities without difficulty  Other:    Medical Decision Making  Medically screening exam initiated at 6:36 PM.  Appropriate orders placed.  [name] was informed that the remainder of the evaluation will be completed by another provider, this initial triage assessment does not replace that evaluation, and the importance of remaining in the ED until their evaluation is complete.     [name] Breen, PA-C 04/26/23 1842

## 2023-04-26 NOTE — ED Triage Notes (Addendum)
 Pt presents today for L eye pain, no injury to eye, pt states this happens before she has a seizure. Pt states HX of seizures. Pt states blurry vision and this started"sometime this afternoon". NAD noted.   Pt denies weakness but endorses feeling tired for the past week. Speech clear, pt is A&Ox4 at this time.   Pt does takes Plavix.

## 2023-05-11 ENCOUNTER — Other Ambulatory Visit: Payer: Self-pay | Admitting: Cardiovascular Disease

## 2023-05-11 DIAGNOSIS — I2510 Atherosclerotic heart disease of native coronary artery without angina pectoris: Secondary | ICD-10-CM

## 2023-05-18 ENCOUNTER — Other Ambulatory Visit: Payer: Self-pay | Admitting: Cardiovascular Disease

## 2023-05-22 ENCOUNTER — Other Ambulatory Visit: Payer: Self-pay

## 2023-05-22 ENCOUNTER — Emergency Department: Admission: EM | Admit: 2023-05-22 | Discharge: 2023-05-22 | Disposition: A | Discharge status: Home / Self Care

## 2023-05-22 DIAGNOSIS — L509 Urticaria, unspecified: Secondary | ICD-10-CM | POA: Diagnosis not present

## 2023-05-22 DIAGNOSIS — E119 Type 2 diabetes mellitus without complications: Secondary | ICD-10-CM | POA: Insufficient documentation

## 2023-05-22 DIAGNOSIS — Z8673 Personal history of transient ischemic attack (TIA), and cerebral infarction without residual deficits: Secondary | ICD-10-CM | POA: Diagnosis not present

## 2023-05-22 DIAGNOSIS — I10 Essential (primary) hypertension: Secondary | ICD-10-CM | POA: Insufficient documentation

## 2023-05-22 DIAGNOSIS — R21 Rash and other nonspecific skin eruption: Secondary | ICD-10-CM

## 2023-05-22 MED ORDER — DEXAMETHASONE SODIUM PHOSPHATE 10 MG/ML IJ SOLN
10.0000 mg | Freq: Once | INTRAMUSCULAR | Status: AC
  Administered 2023-05-22: 10 mg via INTRAMUSCULAR
  Filled 2023-05-22: qty 1

## 2023-05-22 MED ORDER — HYDROCORTISONE 1 % EX CREA: 1.0000 | TOPICAL_CREAM | Freq: Two times a day (BID) | CUTANEOUS | 0 refills | Status: AC

## 2023-05-22 NOTE — ED Notes (Signed)
 Patient declined discharge vital signs.

## 2023-05-22 NOTE — ED Provider Notes (Signed)
 [facility] Provider Note    Event Date/Time   First MD Initiated Contact with Patient 05/22/23 1202     (approximate)   History   Rash  Pt comes in from home via pov with complaints of a rash the past two weeks. Pt with visible redness and irritations to the left side of her upper back, and lower back. Pt states that she is itching really bad, and has had this rash before, and was treated for it. Pt is alert and oriented x4. With no signs of acute distress at this time.   HPI [name] is a 89 y.o. female multiple medical comorbidities including hypertension, hyperlipidemia, prior CVA, rheumatoid arthritis, T2DM presents for evaluation of rash - Present on left upper back extending up towards shoulder.  Duration of about 2 weeks.  Very pruritic, prevented her from sleeping last night so decided to come to emergency department today.  Has been refractory to hydroxyzine . - No clear environmental exposures that could have caused this - No new medications - No vomiting, shortness of breath  Patient states she has had similar rashes in the past and responded well to shot of steroids.  Per my chart review, was seen for rash in May 2024.  Did receive 10 mg IM Decadron  at that time and hydrocortisone .      Physical Exam   Triage Vital Signs: ED Triage Vitals [05/22/23 1157]  Encounter Vitals Group     BP (!) 150/108     Systolic BP Percentile      Diastolic BP Percentile      Pulse Rate 73     Resp 18     Temp 97.6 F (36.4 C)     Temp src      SpO2 97 %     Weight 135 lb (61.2 kg)     Height 5\' 1"  (1.549 m)     Head Circumference      Peak Flow      Pain Score 0     Pain Loc      Pain Education      Exclude from Growth Chart     Most recent vital signs: Vitals:   05/22/23 1157  BP: (!) 150/108  Pulse: 73  Resp: 18  Temp: 97.6 F (36.4 C)  SpO2: 97%     General: Awake, no distress.  CV:  Good peripheral perfusion. RRR,  RP 2+ Resp:  Normal effort. CTAB Abd:  No distention. Nontender to deep palpation throughout Skin:  + Erythematous rash that appears urticarial over left posterior upper thorax extending to about shoulder.  No vesicular lesions.  No streaking.  Mildly warm to touch.  Nontender, no crepitus.   ED Results / Procedures / Treatments   Labs (all labs ordered are listed, but only abnormal results are displayed) Labs Reviewed - No data to display   EKG  N/a   RADIOLOGY N/a    PROCEDURES:  Critical Care performed: No  Procedures   MEDICATIONS ORDERED IN ED: Medications  dexamethasone  (DECADRON ) injection 10 mg (has no administration in time range)     IMPRESSION / MDM / ASSESSMENT AND PLAN / ED COURSE  I reviewed the triage vital signs and the nursing notes.                              DDX/MDM/AP: Differential diagnosis includes, but is not limited to, benign skin  rash/urticaria.  No evidence of systemic anaphylaxis at this time.  Exam not consistent with cellulitis at this time.  Plan: - Will give a single dose of steroids, IM per patient request - Will discharge with hydrocortisone  cream and plan for PMD/outpatient dermatology follow-up (already established) - Continue hydroxyzine   Patient's presentation is most consistent with acute, uncomplicated illness.    Discharge home after single dose of steroids.  Rx hydrocortisone  cream.  Continue hydroxyzine .  Plan for PMD/dermatology follow-up.  ED return precautions in place.      FINAL CLINICAL IMPRESSION(S) / ED DIAGNOSES   Final diagnoses:  Rash  Urticaria     Rx / DC Orders   ED Discharge Orders          Ordered    hydrocortisone  cream 1 %  2 times daily        05/22/23 1220             Note:  This document was prepared using Dragon voice recognition software and may include unintentional dictation errors.   Collis Deaner, MD 05/22/23 [phone]

## 2023-05-22 NOTE — ED Triage Notes (Signed)
 Pt comes in from home via pov with complaints of a rash the past two weeks. Pt with visible redness and irritations to the left side of her upper back, and lower back. Pt states that she is itching really bad, and has had this rash before, and was treated for it. Pt is alert and oriented x4. With no signs of acute distress at this time.

## 2023-05-22 NOTE — Discharge Instructions (Addendum)
 Your evaluation in the emergency department was overall reassuring.  You do appear to have hives on your back, and you received a dose of steroids here.  I have also prescribed you a steroid cream to apply to the affected area twice daily.  Please do follow-up with your primary care doctor and/your dermatologist for reevaluation, and return to the emergency department with any new or worsening symptoms.

## 2023-05-26 ENCOUNTER — Encounter: Payer: Self-pay | Admitting: Emergency Medicine

## 2023-05-26 ENCOUNTER — Other Ambulatory Visit: Payer: Self-pay

## 2023-05-26 ENCOUNTER — Emergency Department
Admission: EM | Admit: 2023-05-26 | Discharge: 2023-05-26 | Disposition: A | Discharge status: Home / Self Care | Attending: Emergency Medicine | Admitting: Emergency Medicine

## 2023-05-26 DIAGNOSIS — E039 Hypothyroidism, unspecified: Secondary | ICD-10-CM | POA: Diagnosis not present

## 2023-05-26 DIAGNOSIS — L259 Unspecified contact dermatitis, unspecified cause: Secondary | ICD-10-CM | POA: Insufficient documentation

## 2023-05-26 DIAGNOSIS — E119 Type 2 diabetes mellitus without complications: Secondary | ICD-10-CM | POA: Diagnosis not present

## 2023-05-26 DIAGNOSIS — I10 Essential (primary) hypertension: Secondary | ICD-10-CM | POA: Diagnosis not present

## 2023-05-26 DIAGNOSIS — I2510 Atherosclerotic heart disease of native coronary artery without angina pectoris: Secondary | ICD-10-CM | POA: Diagnosis not present

## 2023-05-26 DIAGNOSIS — L509 Urticaria, unspecified: Secondary | ICD-10-CM | POA: Diagnosis present

## 2023-05-26 MED ORDER — FAMOTIDINE 40 MG PO TABS: 40.0000 mg | ORAL_TABLET | Freq: Every evening | ORAL | 0 refills | Status: DC

## 2023-05-26 MED ORDER — PREDNISONE 20 MG PO TABS
50.0000 mg | ORAL_TABLET | Freq: Once | ORAL | Status: AC
  Administered 2023-05-26: 50 mg via ORAL
  Filled 2023-05-26: qty 3

## 2023-05-26 MED ORDER — FAMOTIDINE 40 MG/5ML PO SUSR
20.0000 mg | Freq: Once | ORAL | Status: AC
  Administered 2023-05-26: 20 mg via ORAL
  Filled 2023-05-26: qty 2.5

## 2023-05-26 MED ORDER — PREDNISONE 50 MG PO TABS: 50.0000 mg | ORAL_TABLET | Freq: Every day | ORAL | 0 refills | Status: AC

## 2023-05-26 NOTE — ED Triage Notes (Signed)
 Patient to ED via POV for Hives. States ongoing x3 weeks. States she has been taking an "itch pill" which has helped some but they have not gone away. Seen for same on 5/10.

## 2023-05-26 NOTE — ED Provider Notes (Signed)
 [facility] Provider Note    Event Date/Time   First MD Initiated Contact with Patient 05/26/23 1735     (approximate)   History   Urticaria    HPI  [name] is a 89 y.o. female    with a past medical history of chronic anxiety, pain in left eye, cat scratch, syncope, hypertension, hyperlipidemia, prior CVA, rheumatoid arthritis, type 2 diabetes, thyroid  cancer, CAD, gout, hypothyroidism, diverticulitis who presents to the ED complaining of rash. According to the patient, symptoms started 3 weeks ago with rash on her left elbow on her right hip.  Patient states she has an appointment with dermatology this week and internal medicine in [place] [facility].  Per independent chart review patient was seen in May 22, 2023 with diagnosis of urticaria, patient received Decadron , was discharged with Atarax .  Patient states itchiness wakes her up at 2:00 in the morning every day.  Patient denies wheezing, shortness of breath.       Physical Exam   Triage Vital Signs: ED Triage Vitals [05/26/23 1728]  Encounter Vitals Group     BP (!) 160/72     Systolic BP Percentile      Diastolic BP Percentile      Pulse Rate 75     Resp 17     Temp 97.9 F (36.6 C)     Temp Source Oral     SpO2 96 %     Weight 130 lb (59 kg)     Height 5\' 1"  (1.549 m)     Head Circumference      Peak Flow      Pain Score 0     Pain Loc      Pain Education      Exclude from Growth Chart     Most recent vital signs: Vitals:   05/26/23 1728  BP: (!) 160/72  Pulse: 75  Resp: 17  Temp: 97.9 F (36.6 C)  SpO2: 96%     Constitutional: Alert, NAD. Able to speak in complete sentences without cough or dyspnea  Eyes: Conjunctivae are normal.  Presence of periorbital erythema Head: Atraumatic. Nose: No congestion/rhinnorhea. Mouth/Throat: Mucous membranes are moist.   Neck: Painless ROM. Supple. No JVD, nodes, thyromegaly  Cardiovascular:   Good peripheral  circulation.RRR no murmurs, gallops, rubs  Respiratory: Normal respiratory effort.  No retractions. Clear to auscultation bilaterally without wheezing or crackles  Gastrointestinal: Soft and nontender.  Musculoskeletal:  no deformity Neurologic:  MAE spontaneously. No gross focal neurologic deficits are appreciated.  Skin:  Skin is warm, dry and intact.  Rash noted in the left upper extremity at the level of the lateral side of the shoulder, right lumbar area extended to the gluteals.  Skin is warm, erythematosus, no pustular lesions.  No signs of infection Psychiatric: Mood and affect are normal. Speech and behavior are normal.    ED Results / Procedures / Treatments   Labs (all labs ordered are listed, but only abnormal results are displayed) Labs Reviewed - No data to display   EKG      PROCEDURES:  Critical Care performed:   Procedures   MEDICATIONS ORDERED IN ED: Medications  predniSONE  (DELTASONE ) tablet 50 mg (has no administration in time range)  famotidine (PEPCID) 40 MG/5ML suspension 20 mg (has no administration in time range)      IMPRESSION / MDM / ASSESSMENT AND PLAN / ED COURSE  I reviewed the triage vital signs and the nursing notes.  Differential diagnosis includes, but is not limited to, cellulitis, dry skin, eczema, contact dermatitis  Patient's presentation is most consistent with acute, uncomplicated illness.  Patient's diagnosis is consistent with contact dermatitis.  I did review the patient's allergies and medications.The patient is in stable and satisfactory condition for discharge home  Patient will be discharged home with prescriptions for prednisone  50 mg, Cera V cream, famotidine. Patient is to follow up with dermatology, internal medicine this week as needed or otherwise directed. Patient is given ED precautions to return to the ED for any worsening or new symptoms. Discussed plan of care with patient, answered all of patient's questions,  Patient agreeable to plan of care. Advised patient to take medications according to the instructions on the label. Discussed possible side effects of new medications. Patient verbalized understanding.    FINAL CLINICAL IMPRESSION(S) / ED DIAGNOSES   Final diagnoses:  Contact dermatitis and eczema     Rx / DC Orders   ED Discharge Orders          Ordered    famotidine (PEPCID) 40 MG tablet  Every evening        05/26/23 1800    predniSONE  (DELTASONE ) 50 MG tablet  Daily with breakfast        05/26/23 1800             Note:  This document was prepared using Dragon voice recognition software and may include unintentional dictation errors.   Awilda Lennox, PA-C 05/26/23 [name]    [name] Ground, MD 05/28/23 [phone]

## 2023-05-26 NOTE — Discharge Instructions (Addendum)
 You have been diagnosed with contact dermatitis, please take prednisone  1 tablet by mouth with breakfast, take famotidine 1 tablet by mouth every evening.  Please go to your dermatology appointment, and internal medicine appointment.  Come back to ED or go to your PCP if you have new symptoms symptoms worsen.  To prevent scratching you can apply cold compresses.

## 2023-06-04 ENCOUNTER — Other Ambulatory Visit: Payer: Self-pay

## 2023-06-04 ENCOUNTER — Emergency Department
Admission: EM | Admit: 2023-06-04 | Discharge: 2023-06-04 | Disposition: A | Discharge status: Home / Self Care | Attending: Emergency Medicine | Admitting: Emergency Medicine

## 2023-06-04 DIAGNOSIS — R600 Localized edema: Secondary | ICD-10-CM

## 2023-06-04 DIAGNOSIS — E119 Type 2 diabetes mellitus without complications: Secondary | ICD-10-CM | POA: Diagnosis not present

## 2023-06-04 DIAGNOSIS — I2510 Atherosclerotic heart disease of native coronary artery without angina pectoris: Secondary | ICD-10-CM | POA: Diagnosis not present

## 2023-06-04 DIAGNOSIS — I509 Heart failure, unspecified: Secondary | ICD-10-CM | POA: Insufficient documentation

## 2023-06-04 DIAGNOSIS — M7989 Other specified soft tissue disorders: Secondary | ICD-10-CM | POA: Diagnosis present

## 2023-06-04 LAB — CBC WITH DIFFERENTIAL/PLATELET
Abs Immature Granulocytes: 0.07 10*3/uL (ref 0.00–0.07)
Basophils Absolute: 0 10*3/uL (ref 0.0–0.1)
Basophils Relative: 0 %
Eosinophils Absolute: 0.4 10*3/uL (ref 0.0–0.5)
Eosinophils Relative: 4 %
HCT: 34 % — ABNORMAL LOW (ref 36.0–46.0)
Hemoglobin: 11.2 g/dL — ABNORMAL LOW (ref 12.0–15.0)
Immature Granulocytes: 1 %
Lymphocytes Relative: 17 %
Lymphs Abs: 1.4 10*3/uL (ref 0.7–4.0)
MCH: 29.4 pg (ref 26.0–34.0)
MCHC: 32.9 g/dL (ref 30.0–36.0)
MCV: 89.2 fL (ref 80.0–100.0)
Monocytes Absolute: 0.6 10*3/uL (ref 0.1–1.0)
Monocytes Relative: 7 %
Neutro Abs: 6.2 10*3/uL (ref 1.7–7.7)
Neutrophils Relative %: 71 %
Platelets: 253 10*3/uL (ref 150–400)
RBC: 3.81 MIL/uL — ABNORMAL LOW (ref 3.87–5.11)
RDW: 15.2 % (ref 11.5–15.5)
WBC: 8.7 10*3/uL (ref 4.0–10.5)
nRBC: 0 % (ref 0.0–0.2)

## 2023-06-04 LAB — COMPREHENSIVE METABOLIC PANEL WITH GFR
ALT: 19 U/L (ref 0–44)
AST: 37 U/L (ref 15–41)
Albumin: 3.7 g/dL (ref 3.5–5.0)
Alkaline Phosphatase: 59 U/L (ref 38–126)
Anion gap: 10 (ref 5–15)
BUN: 27 mg/dL — ABNORMAL HIGH (ref 8–23)
CO2: 22 mmol/L (ref 22–32)
Calcium: 10.2 mg/dL (ref 8.9–10.3)
Chloride: 108 mmol/L (ref 98–111)
Creatinine, Ser: 1.06 mg/dL — ABNORMAL HIGH (ref 0.44–1.00)
GFR, Estimated: 51 mL/min — ABNORMAL LOW (ref >60)
Glucose, Bld: 106 mg/dL — ABNORMAL HIGH (ref 70–99)
Potassium: 4.5 mmol/L (ref 3.5–5.1)
Sodium: 140 mmol/L (ref 135–145)
Total Bilirubin: 0.9 mg/dL (ref 0.0–1.2)
Total Protein: 6.5 g/dL (ref 6.5–8.1)

## 2023-06-04 LAB — TROPONIN I (HIGH SENSITIVITY): Troponin I (High Sensitivity): 15 ng/L (ref <18)

## 2023-06-04 MED ORDER — FUROSEMIDE 20 MG PO TABS: ORAL_TABLET | ORAL | 0 refills | Status: AC

## 2023-06-04 NOTE — ED Notes (Signed)
 Staff attempted to draw blood twice without success. Primary RN notified.

## 2023-06-04 NOTE — ED Triage Notes (Addendum)
 Pt c/o bilateral ankle swelling that got worse today and difficulty walking due to generalized weakness. Nonpitting edema noted bilaterally. CMS intact. Pt denies pain, dizziness, SHOB. Pt CAOX4. Pt also mentions she has had heartburn since the weekend and states "that is so not like me."

## 2023-06-04 NOTE — ED Provider Notes (Signed)
 [facility] Provider Note    Event Date/Time   First MD Initiated Contact with Patient 06/04/23 2135     (approximate)   History   Leg swelling  HPI  Turquoise Esch is a 89 y.o. female with a history of CAD, diabetes, CHF who presents with complaints of swelling in her ankles bilaterally, and feet bilaterally.  She reports this is improved now that she has had her feet elevated.  She denies pain.  No calf pain or swelling.  No shortness of breath.  She used to be on Lasix  but no longer     Physical Exam   Triage Vital Signs: ED Triage Vitals  Encounter Vitals Group     BP 06/04/23 2049 (!) 161/63     Systolic BP Percentile --      Diastolic BP Percentile --      Pulse Rate 06/04/23 2049 81     Resp 06/04/23 2049 18     Temp 06/04/23 2053 98.4 F (36.9 C)     Temp Source 06/04/23 2053 Oral     SpO2 06/04/23 2049 100 %     Weight 06/04/23 2050 59 kg (130 lb)     Height 06/04/23 2050 1.549 m (5\' 1" )     Head Circumference --      Peak Flow --      Pain Score 06/04/23 2050 0     Pain Loc --      Pain Education --      Exclude from Growth Chart --     Most recent vital signs: Vitals:   06/04/23 2200 06/04/23 2230  BP: (!) 167/40 (!) 156/54  Pulse: 73 72  Resp:    Temp:    SpO2: 100% 100%     General: Awake, no distress.  CV:  Good peripheral perfusion.  Resp:  Normal effort.  Abd:  No distention.  Other:  No significant lower extremity edema   ED Results / Procedures / Treatments   Labs (all labs ordered are listed, but only abnormal results are displayed) Labs Reviewed  CBC WITH DIFFERENTIAL/PLATELET - Abnormal; Notable for the following components:      Result Value   RBC 3.81 (*)    Hemoglobin 11.2 (*)    HCT 34.0 (*)    All other components within normal limits  COMPREHENSIVE METABOLIC PANEL WITH GFR - Abnormal; Notable for the following components:   Glucose, Bld 106 (*)    BUN 27 (*)    Creatinine, Ser 1.06  (*)    GFR, Estimated 51 (*)    All other components within normal limits  TROPONIN I (HIGH SENSITIVITY)  TROPONIN I (HIGH SENSITIVITY)     EKG ED ECG REPORT I, Bryson Carbine, the attending physician, personally viewed and interpreted this ECG.  Date: 06/04/2023  Rhythm: normal sinus rhythm QRS Axis: normal Intervals: normal ST/T Wave abnormalities: normal Narrative Interpretation: no evidence of acute ischemia     RADIOLOGY     PROCEDURES:  Critical Care performed:   Procedures   MEDICATIONS ORDERED IN ED: Medications - No data to display   IMPRESSION / MDM / ASSESSMENT AND PLAN / ED COURSE  I reviewed the triage vital signs and the nursing notes. Patient's presentation is most consistent with exacerbation of chronic illness.  Patient presents with reports of lower extremity swelling, now improved, BUN and creatinine are unchanged from prior levels.  She reports a history of edema in the past and had  been on Lasix  but no longer.  She denies chest pain or shortness of breath to me.  Lab work reviewed and is overall reassuring.  Will put the patient on low-dose Lasix , outpatient follow-up with PCP recommended, no indication for further workup at this time        FINAL CLINICAL IMPRESSION(S) / ED DIAGNOSES   Final diagnoses:  Peripheral edema     Rx / DC Orders   ED Discharge Orders          Ordered    furosemide  (LASIX ) 20 MG tablet       Note to Pharmacy: Please send a replace/new response with 90-Day Supply if appropriate to maximize member benefit. Requesting 1 year supply.   06/04/23 2231             Note:  This document was prepared using Dragon voice recognition software and may include unintentional dictation errors.   Bryson Carbine, MD 06/04/23 2257

## 2023-06-14 ENCOUNTER — Emergency Department
Admission: EM | Admit: 2023-06-14 | Discharge: 2023-06-14 | Disposition: A | Discharge status: Home / Self Care | Attending: Emergency Medicine | Admitting: Emergency Medicine

## 2023-06-14 ENCOUNTER — Other Ambulatory Visit: Payer: Self-pay

## 2023-06-14 DIAGNOSIS — R21 Rash and other nonspecific skin eruption: Secondary | ICD-10-CM | POA: Insufficient documentation

## 2023-06-14 DIAGNOSIS — N3000 Acute cystitis without hematuria: Secondary | ICD-10-CM | POA: Insufficient documentation

## 2023-06-14 DIAGNOSIS — R350 Frequency of micturition: Secondary | ICD-10-CM | POA: Diagnosis present

## 2023-06-14 LAB — URINALYSIS, ROUTINE W REFLEX MICROSCOPIC
Bilirubin Urine: NEGATIVE
Glucose, UA: NEGATIVE mg/dL
Hgb urine dipstick: NEGATIVE
Ketones, ur: NEGATIVE mg/dL
Leukocytes,Ua: NEGATIVE
Nitrite: NEGATIVE
Protein, ur: 30 mg/dL — AB
Specific Gravity, Urine: 1.009 (ref 1.005–1.030)
pH: 5 (ref 5.0–8.0)

## 2023-06-14 MED ORDER — PREDNISONE 20 MG PO TABS
60.0000 mg | ORAL_TABLET | Freq: Once | ORAL | Status: AC
  Administered 2023-06-14: 60 mg via ORAL
  Filled 2023-06-14: qty 3

## 2023-06-14 MED ORDER — FAMOTIDINE 20 MG PO TABS
20.0000 mg | ORAL_TABLET | Freq: Once | ORAL | Status: AC
  Administered 2023-06-14: 20 mg via ORAL
  Filled 2023-06-14: qty 1

## 2023-06-14 MED ORDER — DIPHENHYDRAMINE-ZINC ACETATE 2-0.1 % EX CREA: 1.0000 | TOPICAL_CREAM | Freq: Three times a day (TID) | CUTANEOUS | 0 refills | Status: AC | PRN

## 2023-06-14 MED ORDER — NITROFURANTOIN MONOHYD MACRO 100 MG PO CAPS: 100.0000 mg | ORAL_CAPSULE | Freq: Two times a day (BID) | ORAL | 0 refills | Status: AC

## 2023-06-14 MED ORDER — PREDNISONE 50 MG PO TABS: 50.0000 mg | ORAL_TABLET | Freq: Every day | ORAL | 0 refills | Status: AC

## 2023-06-14 MED ORDER — FAMOTIDINE 20 MG PO TABS: 20.0000 mg | ORAL_TABLET | Freq: Two times a day (BID) | ORAL | 0 refills | Status: AC

## 2023-06-14 NOTE — ED Notes (Signed)
 See triage notes. Patient c/o an all over rash that she states itches really bad. Patient has been seen here for this and was told it was not shingles. Patient is also concerned about a UTI stating her urine stinks, is discolored and it burns when she urinates.

## 2023-06-14 NOTE — ED Notes (Signed)
 Pt ambulated to bathroom to provide urine sample and back to room.

## 2023-06-14 NOTE — Discharge Instructions (Signed)
 You have been diagnosed with cystitis without hematuria, and rash.  Please take famotidine  1 tablet every 12 hours, for itchiness .  Please take prednisone  1 tablet with breakfast daily for the next 3 days.  You can apply Benadryl  cream 3 times daily as needed for itching.  Please applied flea treatment to your cats.  Please wash your bed sheets.  You can take Macrobid 1 capsule by mouth every 12 hours for 3 days.  Please drink plenty fluids.  Please come back to ED or go to your PCP if you have new symptoms or symptoms worsen

## 2023-06-14 NOTE — ED Provider Notes (Signed)
 [facility] Provider Note    Event Date/Time   First MD Initiated Contact with Patient 06/14/23 1826     (approximate)   History   UTI and Rash    HPI  [name] is a 89 y.o. female    with a past medical history of effusion of right knee, cat scratch of hand, dysphagia, urticaria fracture of inferior pubic ramus, who presents to the ED complaining of febrile UTI, rash. According to the patient, rash symptoms started 3 months ago but they are getting worse this week.  Patient states that she sleeps with her cats, last night she found black dots on her sheets.  Patient is having frequency, dysuria,  malodorous urine.  Patient denies fever, asthenia or adynamia.      Physical Exam   Triage Vital Signs: ED Triage Vitals  Encounter Vitals Group     BP 06/14/23 1720 (!) 147/82     Systolic BP Percentile --      Diastolic BP Percentile --      Pulse Rate 06/14/23 1720 78     Resp 06/14/23 1720 18     Temp 06/14/23 1720 98 F (36.7 C)     Temp src --      SpO2 06/14/23 1720 100 %     Weight 06/14/23 1719 130 lb (59 kg)     Height 06/14/23 1719 5\' 1"  (1.549 m)     Head Circumference --      Peak Flow --      Pain Score 06/14/23 1719 0     Pain Loc --      Pain Education --      Exclude from Growth Chart --     Most recent vital signs: Vitals:   06/14/23 1720  BP: (!) 147/82  Pulse: 78  Resp: 18  Temp: 98 F (36.7 C)  SpO2: 100%     Constitutional: Alert, NAD. Able to speak in complete sentences without cough or dyspnea  Eyes: Conjunctivae are normal.  Head: Atraumatic. Nose: No congestion/rhinnorhea. Mouth/Throat: Mucous membranes are moist.   Neck: Painless ROM. Supple. No JVD, nodes, thyromegaly  Cardiovascular:   Good peripheral circulation.RRR no murmurs, gallops, rubs  Respiratory: Normal respiratory effort.  No retractions. Clear to auscultation bilaterally without wheezing or crackles  Gastrointestinal: Soft and  nontender.  Musculoskeletal:  no deformity Neurologic:  MAE spontaneously. No gross focal neurologic deficits are appreciated.  Skin:  Skin is warm, dry and intact.  Posterior thorax: Presence of erythema, that extends to the gluteus.  No signs of infection.  Psychiatric: Mood and affect are normal. Speech and behavior are normal.    ED Results / Procedures / Treatments   Labs (all labs ordered are listed, but only abnormal results are displayed) Labs Reviewed  URINALYSIS, ROUTINE W REFLEX MICROSCOPIC - Abnormal; Notable for the following components:      Result Value   Color, Urine YELLOW (*)    APPearance HAZY (*)    Protein, ur 30 (*)    Bacteria, UA MANY (*)    All other components within normal limits  URINALYSIS, W/ REFLEX TO CULTURE (INFECTION SUSPECTED)     EKG     RADIOLOGY     PROCEDURES:  Critical Care performed:   Procedures   MEDICATIONS ORDERED IN ED: Medications  predniSONE  (DELTASONE ) tablet 60 mg (60 mg Oral Given 06/14/23 1947)  famotidine  (PEPCID ) tablet 20 mg (20 mg Oral Given 06/14/23 1947)  Clinical Course as of 06/14/23 2050  Mon Jun 14, 2023  2034 Reassessed the patient, states she has moderate resolution of the itchiness.  Updated the patient with UA analysis. [AE]    Clinical Course User Index [AE] Awilda Lennox, PA-C    IMPRESSION / MDM / ASSESSMENT AND PLAN / ED COURSE  I reviewed the triage vital signs and the nursing notes.  Differential diagnosis includes, but is not limited to, UTI, contact dermatitis, insect bite,  Patient's presentation is most consistent with acute complicated illness / injury requiring diagnostic workup.   [name], female 89 year old, today to ED with a chief complaint of frequency, dysuria, and rash on her back .  Physical exam the rash is on her back and buttocks.  Patient states she is lives with cats and the rash can be secondary to fleas .  I advised patient to use flea medication for  her cats and change her bed sheets . Patient's diagnosis is consistent with UTI.  I ordered urine culture and explained to the patient the results will come back 3 days.  Patient desires to be informed of the results via phone call because they do not use Internet I . Labs are  reassuring.  During admission patient received prednisone , famotidine  that helped resolve the itchiness. I did review the patient's allergies and medications.The patient is in stable and satisfactory condition for discharge home.   Patient will be discharged home with prescriptions for famotidine , prednisone , nitrofurantoin. Patient is to follow up with PCP as needed or otherwise directed for follow-up of the UTI. Patient is given ED precautions to return to the ED for any worsening or new symptoms. Discussed plan of care with patient, answered all of patient's questions, Patient agreeable to plan of care. Advised patient to take medications according to the instructions on the label. Discussed possible side effects of new medications. Patient verbalized understanding.    FINAL CLINICAL IMPRESSION(S) / ED DIAGNOSES   Final diagnoses:  Rash and nonspecific skin eruption  Acute cystitis without hematuria     Rx / DC Orders   ED Discharge Orders          Ordered    famotidine  (PEPCID ) 20 MG tablet  2 times daily        06/14/23 2048    predniSONE  (DELTASONE ) 50 MG tablet  Daily with breakfast        06/14/23 2048    diphenhydrAMINE -zinc  acetate (BENADRYL  EXTRA STRENGTH) cream  3 times daily PRN        06/14/23 2048    nitrofurantoin, macrocrystal-monohydrate, (MACROBID) 100 MG capsule  2 times daily        06/14/23 2048             Note:  This document was prepared using Dragon voice recognition software and may include unintentional dictation errors.   Awilda Lennox, PA-C 06/14/23 2051    Claria Crofts, MD 06/14/23 2322

## 2023-06-14 NOTE — ED Notes (Signed)
 Pt stated she has hypertension and needed to take her medications so she was not alarmed at the elevated bp upon discharge.

## 2023-06-14 NOTE — ED Triage Notes (Signed)
 Pt comes with rash all over that itches really bad and was seen here and told it wasn't shingles. Pt states also she might have UTI. Pt states pain and burning, odor and color to her. Pt states that started 3 weeks ago.

## 2023-06-15 ENCOUNTER — Encounter: Payer: Self-pay | Admitting: Emergency Medicine

## 2023-06-15 ENCOUNTER — Emergency Department
Admission: EM | Admit: 2023-06-15 | Discharge: 2023-06-15 | Disposition: A | Discharge status: Home / Self Care | Attending: Emergency Medicine | Admitting: Emergency Medicine

## 2023-06-15 DIAGNOSIS — Z8616 Personal history of COVID-19: Secondary | ICD-10-CM | POA: Diagnosis not present

## 2023-06-15 DIAGNOSIS — E119 Type 2 diabetes mellitus without complications: Secondary | ICD-10-CM | POA: Diagnosis not present

## 2023-06-15 DIAGNOSIS — I110 Hypertensive heart disease with heart failure: Secondary | ICD-10-CM | POA: Diagnosis not present

## 2023-06-15 DIAGNOSIS — Z85850 Personal history of malignant neoplasm of thyroid: Secondary | ICD-10-CM | POA: Diagnosis not present

## 2023-06-15 DIAGNOSIS — I2510 Atherosclerotic heart disease of native coronary artery without angina pectoris: Secondary | ICD-10-CM | POA: Insufficient documentation

## 2023-06-15 DIAGNOSIS — S20462A Insect bite (nonvenomous) of left back wall of thorax, initial encounter: Secondary | ICD-10-CM | POA: Diagnosis present

## 2023-06-15 DIAGNOSIS — W57XXXA Bitten or stung by nonvenomous insect and other nonvenomous arthropods, initial encounter: Secondary | ICD-10-CM

## 2023-06-15 DIAGNOSIS — E039 Hypothyroidism, unspecified: Secondary | ICD-10-CM | POA: Diagnosis not present

## 2023-06-15 DIAGNOSIS — I5032 Chronic diastolic (congestive) heart failure: Secondary | ICD-10-CM | POA: Insufficient documentation

## 2023-06-15 NOTE — Discharge Instructions (Addendum)
 Please follow up with your outpatient provider.  If the area continues to itch and then apply cool compresses to the area.  Please return for any new, worsening, or change in symptoms or other concerns.  It was a pleasure caring for you today.

## 2023-06-15 NOTE — ED Provider Notes (Signed)
 [facility] Provider Note    Event Date/Time   First MD Initiated Contact with Patient 06/15/23 1329     (approximate)   History   Insect Bite   HPI  [name] is a 89 y.o. female who presents today for evaluation of insect bite.  Patient reports that she was laying in her bed at approximately 7 AM this morning when she felt like she was bit by something.  She reports that she had bleeding through her shirt and she is worried that she was bit by a spider.  She reports that she feels fine now.  Patient Active Problem List   Diagnosis Date Noted   Recurrent syncope 01/21/2023   Prediabetes 02/14/2022   Stroke-like symptom 02/12/2022   Visual impairment 02/12/2022   Hypokalemia 10/14/2021   Hypomagnesemia 10/14/2021   Weakness 10/13/2021   COVID-19 virus infection 10/13/2021   Sundowning 02/14/2021   Dens fracture (HCC) 02/12/2021   Unsteady gait 02/12/2021   Hypothyroidism 02/11/2021   CAD (coronary artery disease) 02/11/2021   Multiple skin tears 02/11/2021   Chronic diastolic CHF (congestive heart failure) (HCC) 02/11/2021   Seizure (HCC) 08/25/2020   Diverticulitis 07/24/2020   Intractable nausea and vomiting 07/23/2020   Generalized weakness 07/23/2020   Achilles tendon contracture, bilateral 06/29/2017   Acute bilateral low back pain without sciatica 03/17/2016   Idiopathic chronic gout of multiple sites without tophus 03/17/2016   Idiopathic gout of multiple sites 11/27/2015   Cellulitis 05/15/2015   Bilateral leg edema 02/08/2015   Chronic anxiety 11/22/2014   Cerebrovascular accident (CVA) (HCC) 07/25/2014   Arterial vascular disease 07/25/2014   Swelling of right lower extremity 06/22/2014   Diabetes mellitus without complication (HCC)    Stroke (HCC) 06/08/2014   Anemia 06/07/2014   Unstable angina (HCC) 06/06/2014   Carotid artery disease (HCC)    Atherosclerosis of native coronary artery with stable angina pectoris  (HCC)    Hyperlipidemia    Essential hypertension    Angina pectoris (HCC) 05/31/2014   SOB (shortness of breath) 04/11/2014   Palpitations 04/11/2014   Other fatigue 04/11/2014   Thyroid  cancer (HCC) 04/11/2014          Physical Exam   Triage Vital Signs: ED Triage Vitals [06/15/23 1238]  Encounter Vitals Group     BP (!) 149/62     Systolic BP Percentile      Diastolic BP Percentile      Pulse Rate (!) 101     Resp 17     Temp 97.6 F (36.4 C)     Temp Source Oral     SpO2 100 %     Weight 130 lb (59 kg)     Height 5\' 1"  (1.549 m)     Head Circumference      Peak Flow      Pain Score 2     Pain Loc      Pain Education      Exclude from Growth Chart     Most recent vital signs: Vitals:   06/15/23 1238  BP: (!) 149/62  Pulse: (!) 101  Resp: 17  Temp: 97.6 F (36.4 C)  SpO2: 100%    Physical Exam Vitals and nursing note reviewed.  Constitutional:      General: Awake and alert. No acute distress.    Appearance: Normal appearance. The patient is normal weight.  HENT:     Head: Normocephalic and atraumatic.     Mouth:  Mucous membranes are moist.  Eyes:     General: PERRL. Normal EOMs        Right eye: No discharge.        Left eye: No discharge.     Conjunctiva/sclera: Conjunctivae normal.  Cardiovascular:     Rate and Rhythm: Normal rate and regular rhythm.     Pulses: Normal pulses.  Pulmonary:     Effort: Pulmonary effort is normal. No respiratory distress.     Breath sounds: Normal breath sounds.  Abdominal:     Abdomen is soft. There is no abdominal tenderness. No rebound or guarding. No distention. Musculoskeletal:        General: No swelling. Normal range of motion.     Cervical back: Normal range of motion and neck supple.  Skin:    General: Skin is warm and dry.     Capillary Refill: Capillary refill takes less than 2 seconds.     Findings: Scabbed area noted to left upper back without surrounding erythema. Neurological:     Mental  Status: The patient is awake and alert.      ED Results / Procedures / Treatments   Labs (all labs ordered are listed, but only abnormal results are displayed) Labs Reviewed - No data to display   EKG     RADIOLOGY     PROCEDURES:  Critical Care performed:   Procedures   MEDICATIONS ORDERED IN ED: Medications - No data to display   IMPRESSION / MDM / ASSESSMENT AND PLAN / ED COURSE  I reviewed the triage vital signs and the nursing notes.   Differential diagnosis includes, but is not limited to, insect bite, cellulitis, eczema.  Patient is awake and alert, hemodynamically stable and afebrile.  There is a small scabbed area to her left upper back which is likely where she was bit.  There is no evidence of infection at this time.  There is no abscess.  There is no swelling.  Reassurance provided.  We discussed return precautions and outpatient follow-up.  Patient or stands and agrees with plan.  Discharged in stable condition.   Patient's presentation is most consistent with acute, uncomplicated illness.    FINAL CLINICAL IMPRESSION(S) / ED DIAGNOSES   Final diagnoses:  Insect bite of left back wall of thorax, initial encounter     Rx / DC Orders   ED Discharge Orders     None        Note:  This document was prepared using Dragon voice recognition software and may include unintentional dictation errors.   Anokhi Shannon E, PA-C 06/15/23 1444    Viviano Ground, MD 06/15/23 1531

## 2023-06-15 NOTE — ED Triage Notes (Signed)
 Patient to ED via POV for insect bite. Pt states she felt something bite her back while sleeping and then her back bled. No bleeding at this time.

## 2023-06-16 LAB — URINE CULTURE: Culture: >=100000 — AB

## 2023-06-17 ENCOUNTER — Encounter: Payer: Self-pay | Admitting: Dermatology

## 2023-06-17 ENCOUNTER — Ambulatory Visit: Admitting: Dermatology

## 2023-06-17 DIAGNOSIS — L509 Urticaria, unspecified: Secondary | ICD-10-CM

## 2023-06-17 DIAGNOSIS — L501 Idiopathic urticaria: Secondary | ICD-10-CM

## 2023-06-17 NOTE — Patient Instructions (Addendum)
 Referral placed to [facility]. They will call you to schedule an appointment.      Due to recent changes in healthcare laws, you may see results of your pathology and/or laboratory studies on MyChart before the doctors have had a chance to review them. We understand that in some cases there may be results that are confusing or concerning to you. Please understand that not all results are received at the same time and often the doctors may need to interpret multiple results in order to provide you with the best plan of care or course of treatment. Therefore, we ask that you please give us  2 business days to thoroughly review all your results before contacting the office for clarification. Should we see a critical lab result, you will be contacted sooner.   If You Need Anything After Your Visit  If you have any questions or concerns for your doctor, please call our main line at [phone] and press option 4 to reach your doctor's medical assistant. If no one answers, please leave a voicemail as directed and we will return your call as soon as possible. Messages left after 4 pm will be answered the following business day.   You may also send us  a message via MyChart. We typically respond to MyChart messages within 1-2 business days.  For prescription refills, please ask your pharmacy to contact our office. Our fax number is [phone].  If you have an urgent issue when the clinic is closed that cannot wait until the next business day, you can page your doctor at the number below.    Please note that while we do our best to be available for urgent issues outside of office hours, we are not available 24/7.   If you have an urgent issue and are unable to reach us , you may choose to seek medical care at your doctor's office, retail clinic, urgent care center, or emergency room.  If you have a medical emergency, please immediately call 911 or go to the emergency department.  Pager  Numbers  - Dr. Bary Likes: [phone]  - Dr. Annette Barters: [phone]  - Dr. Felipe Horton: [phone]   In the event of inclement weather, please call our main line at [phone] for an update on the status of any delays or closures.  Dermatology Medication Tips: Please keep the boxes that topical medications come in in order to help keep track of the instructions about where and how to use these. Pharmacies typically print the medication instructions only on the boxes and not directly on the medication tubes.   If your medication is too expensive, please contact our office at [phone] option 4 or send us  a message through MyChart.   We are unable to tell what your co-pay for medications will be in advance as this is different depending on your insurance coverage. However, we may be able to find a substitute medication at lower cost or fill out paperwork to get insurance to cover a needed medication.   If a prior authorization is required to get your medication covered by your insurance company, please allow us  1-2 business days to complete this process.  Drug prices often vary depending on where the prescription is filled and some pharmacies may offer cheaper prices.  The website www.goodrx.com contains coupons for medications through different pharmacies. The prices here do not account for what the cost may be with help from insurance (it may be cheaper with your insurance), but the website can give you  the price if you did not use any insurance.  - You can print the associated coupon and take it with your prescription to the pharmacy.  - You may also stop by our office during regular business hours and pick up a GoodRx coupon card.  - If you need your prescription sent electronically to a different pharmacy, notify our office through [facility] or by phone at [phone] option 4.     Si Usted Necesita Algo Despus de Su Visita  Tambin puede enviarnos un mensaje a travs de  Clinical cytogeneticist. Por lo general respondemos a los mensajes de MyChart en el transcurso de 1 a 2 das hbiles.  Para renovar recetas, por favor pida a su farmacia que se ponga en contacto con nuestra oficina. Franz Jacks de fax es [place] [phone].  Si tiene un asunto urgente cuando la clnica est cerrada y que no puede esperar hasta el siguiente da hbil, puede llamar/localizar a su doctor(a) al nmero que aparece a continuacin.   Por favor, tenga en cuenta que aunque hacemos todo lo posible para estar disponibles para asuntos urgentes fuera del horario de [place], no estamos disponibles las 24 horas del da, los 7 [address]  [address] de la [place].   Si tiene un problema urgente y no puede comunicarse con nosotros, puede optar por buscar atencin mdica  en el consultorio de su doctor(a), en una clnica privada, en un centro de atencin urgente o en una sala de emergencias.  Si tiene Engineer, drilling, por favor llame inmediatamente al 911 o vaya a la sala de emergencias.  Nmeros de bper  - Dr. Bary Likes: [phone]  - Dra. Annette Barters: [phone]  - Dr. Felipe Horton: [phone]   En caso de inclemencias del tiempo, por favor llame a Lajuan Pila principal al [phone] para una actualizacin sobre el [place] de cualquier retraso o cierre.  Consejos para la medicacin en dermatologa: Por favor, guarde las cajas en las que vienen los medicamentos de uso tpico para ayudarle a seguir las instrucciones sobre dnde y cmo usarlos. Las farmacias generalmente imprimen las instrucciones del medicamento slo en las cajas y no directamente en los tubos del [place].   Si su medicamento es muy caro, por favor, pngase en contacto con Bettyjane Brunet llamando al [phone] y presione la opcin 4 o envenos un mensaje a travs de Clinical cytogeneticist.   No podemos decirle cul ser su copago por los medicamentos por adelantado ya que esto es diferente dependiendo de la cobertura de su seguro. Sin embargo, es posible que  podamos encontrar un medicamento sustituto a Audiological scientist un formulario para que el seguro cubra el medicamento que se considera necesario.   Si se requiere una autorizacin previa para que su compaa de seguros [place] su medicamento, por favor permtanos de 1 a 2 das hbiles para completar este proceso.  Los precios de los medicamentos varan con frecuencia dependiendo del Environmental consultant de dnde se surte la receta y alguna farmacias pueden ofrecer precios ms baratos.  El sitio web www.goodrx.com tiene cupones para medicamentos de Health and safety inspector. Los precios aqu no tienen en cuenta lo que podra costar con la ayuda del seguro (puede ser ms barato con su seguro), pero el sitio web puede darle el precio si no utiliz Tourist information centre manager.  - Puede imprimir el cupn correspondiente y llevarlo con su receta a la farmacia.  - Tambin puede pasar por nuestra oficina durante el horario de atencin regular y Education officer, museum una tarjeta de cupones de GoodRx.  - Si  necesita que su receta se enve electrnicamente a una farmacia diferente, informe a nuestra oficina a travs de MyChart de [place] o por telfono llamando al [phone] y presione la opcin 4.

## 2023-06-17 NOTE — Progress Notes (Signed)
    Follow Up Visit   Subjective  [name] is a 89 y.o. female who presents for the following: [name] is a 89 y.o. female who presents for the following: rash, hives, itching. Scalp, torso, arms. Duration of flare: 3 months. Has been to ED ~3 times for this rash. Last visit to ED ws 06/14/2023. She states the itching resolved for 3 days. Was given prednisone , famotidine  and advised to use Benadryl  cream 3x/day. Was diagnosed with hives by Dr. Bary Likes 02/19/2022.  Was started on Xolair  03/04/2022. Did not follow up visit so only one Xolair  injection was given.   Has seen an allergist >20 years ago. PCP recently recommended referral to allergist for further evaluation and testing.   States she is under a large amount of stress. Husband has mental issues and daughter is in a bad situation in her life and she can't help her.     The following portions of the chart were reviewed this encounter and updated as appropriate: medications, allergies, medical history  Review of Systems:  No other skin or systemic complaints except as noted in HPI or Assessment and Plan.  Objective  Well appearing patient in no apparent distress; mood and affect are within normal limits.  A focused examination was performed of the following areas: Back, abdomen, arms  Relevant exam findings are noted in the Assessment and Plan.    Assessment & Plan   CHRONIC IDIOPATHIC URTICARIA, failed claritin singulair  hydroxyzine , insufficient course of xolair  Present for over a year, chronic, recalcitrant, not at patient goal  Exam: pink patches with excoriations on back. Improved after prednisone  from urgent care  Plan: Condition responded to prednisone  but discussed that prednisone  has side effects if used chronically and is not recommended Discussed that allergists are experts on idiopathic urticaria. Discussed options of empiric Dupixent vs referral to allergy. Patient opts for  referral Ambulatory referral to Allergy   URTICARIA   Related Procedures Ambulatory referral to Allergy Related Medications montelukast  (SINGULAIR ) 10 MG tablet TAKE 1 TABLET(10 MG) BY MOUTH EVERY MORNING CHRONIC IDIOPATHIC URTICARIA   Related Procedures Ambulatory referral to Allergy  Return if symptoms worsen or fail to improve.  I, Jill Parcell, CMA, am acting as scribe for Harris Liming, MD.   Documentation: I have reviewed the above documentation for accuracy and completeness, and I agree with the above.  Harris Liming, MD

## 2023-06-18 NOTE — Progress Notes (Signed)
 ED Antimicrobial Stewardship Positive Culture Follow Up   89 y.o. female presented to [facility] on 06/14/2023 with a chief complaint of  Chief Complaint  Patient presents with   UTI   Rash  .  Recent Results (from the past 720 hours)  Urine Culture     Status: Abnormal   Collection Time: 06/14/23  5:21 PM   Specimen: Urine, Clean Catch  Result Value Ref Range Status   Specimen Description   Final    URINE, CLEAN CATCH Performed at [facility], [address], [place], [place] [address]    Special Requests   Final    NONE Performed at [facility], [address] [place], [place] [address]    Culture >=100,000 COLONIES/mL KLEBSIELLA PNEUMONIAE (A)  Final   Report Status 06/16/2023 FINAL  Final   Organism ID, Bacteria KLEBSIELLA PNEUMONIAE (A)  Final      Susceptibility   Klebsiella pneumoniae - MIC*    AMPICILLIN RESISTANT Resistant     CEFAZOLIN <=4 SENSITIVE Sensitive     CEFEPIME <=0.12 SENSITIVE Sensitive     CEFTRIAXONE  <=0.25 SENSITIVE Sensitive     CIPROFLOXACIN <=0.25 SENSITIVE Sensitive     GENTAMICIN <=1 SENSITIVE Sensitive     IMIPENEM <=0.25 SENSITIVE Sensitive     NITROFURANTOIN 64 INTERMEDIATE Intermediate     TRIMETH/SULFA <=20 SENSITIVE Sensitive     AMPICILLIN/SULBACTAM 4 SENSITIVE Sensitive     PIP/TAZO <=4 SENSITIVE Sensitive ug/mL    * >=100,000 COLONIES/mL KLEBSIELLA PNEUMONIAE     ASSESSMENT: [x]  Treated with Macrobid, organism resistant to prescribed antimicrobial []  Patient discharged originally without antimicrobial agent and treatment is now indicated []  Patient discharged without a prescription and, although the culture is positive, issuance of an antimicrobial prescription was not appropriate per review with follow-up ED provider   PLAN: [x]  New antibiotic prescription(s): cephalexin  500mg  PO q12H x 5 days []  No new antibiotic prescription(s)  ED provider consulted: Martina Sledge   Will M. Alva Jewels, PharmD Clinical  Pharmacist 06/18/2023 5:41 PM

## 2023-06-21 ENCOUNTER — Telehealth: Payer: Self-pay

## 2023-06-21 NOTE — Telephone Encounter (Signed)
 [facility] reviewed referral sent. After reviewing her notes, Dr. Almeda Jacobs believes [facility].

## 2023-06-22 NOTE — Telephone Encounter (Signed)
 Patient came into the office because she had not heard about referral from Dr. Felipe Horton. Manfred Seed advised patient I tried to call her. I was with another patient at the time and [name] did not want to wait.  Called patient again, no answer and voicemail box full. aw

## 2023-06-22 NOTE — Telephone Encounter (Signed)
 Called patient to see which location she would prefer for referral. No answer and voicemail box full. aw

## 2023-06-23 NOTE — Telephone Encounter (Signed)
 Tried to call patient again but no answer and voicemail box is full. aw

## 2023-06-24 ENCOUNTER — Other Ambulatory Visit: Payer: Self-pay | Admitting: Cardiovascular Disease

## 2023-07-07 ENCOUNTER — Other Ambulatory Visit: Payer: Self-pay

## 2023-07-07 ENCOUNTER — Emergency Department
Admission: EM | Admit: 2023-07-07 | Discharge: 2023-07-07 | Disposition: A | Discharge status: Home / Self Care | Attending: Emergency Medicine | Admitting: Emergency Medicine

## 2023-07-07 DIAGNOSIS — Z85850 Personal history of malignant neoplasm of thyroid: Secondary | ICD-10-CM | POA: Insufficient documentation

## 2023-07-07 DIAGNOSIS — E039 Hypothyroidism, unspecified: Secondary | ICD-10-CM | POA: Insufficient documentation

## 2023-07-07 DIAGNOSIS — E119 Type 2 diabetes mellitus without complications: Secondary | ICD-10-CM | POA: Diagnosis not present

## 2023-07-07 DIAGNOSIS — S50812A Abrasion of left forearm, initial encounter: Secondary | ICD-10-CM | POA: Diagnosis present

## 2023-07-07 DIAGNOSIS — I5032 Chronic diastolic (congestive) heart failure: Secondary | ICD-10-CM | POA: Insufficient documentation

## 2023-07-07 DIAGNOSIS — W5503XA Scratched by cat, initial encounter: Secondary | ICD-10-CM | POA: Diagnosis not present

## 2023-07-07 DIAGNOSIS — Z8673 Personal history of transient ischemic attack (TIA), and cerebral infarction without residual deficits: Secondary | ICD-10-CM | POA: Diagnosis not present

## 2023-07-07 DIAGNOSIS — I110 Hypertensive heart disease with heart failure: Secondary | ICD-10-CM | POA: Insufficient documentation

## 2023-07-07 DIAGNOSIS — I2510 Atherosclerotic heart disease of native coronary artery without angina pectoris: Secondary | ICD-10-CM | POA: Diagnosis not present

## 2023-07-07 DIAGNOSIS — Z8616 Personal history of COVID-19: Secondary | ICD-10-CM | POA: Insufficient documentation

## 2023-07-07 MED ORDER — AMOXICILLIN-POT CLAVULANATE 875-125 MG PO TABS: 1.0000 | ORAL_TABLET | Freq: Two times a day (BID) | ORAL | 0 refills | Status: AC

## 2023-07-07 NOTE — Discharge Instructions (Addendum)
 You have been diagnosed with cat scratch.  You can remove the dressing in 2 days.  Please do not submerge your left arm in water.  Please take Augmentin  1 tablet by mouth every 12 hours for 7 days after main meals.  Please check for signs of infection.  Please come back to ED or go to your PCP if you have new symptoms or symptoms worsen

## 2023-07-07 NOTE — ED Triage Notes (Signed)
 Pt sts that she is here for a cat scratch on her left arm. Pt has two superficial scratches.

## 2023-07-07 NOTE — ED Provider Notes (Signed)
 [facility] Provider Note    Event Date/Time   First MD Initiated Contact with Patient 07/07/23 1523     (approximate)   History   Cat Scratch    HPI  [name] is a 89 y.o. female    with a past medical history of hypertension, hyperlipidemia, hypothyroidism, diabetes, and RA, OA, with no significant past medical history who presents to the ED complaining of cat scratch. According to the patient, she was playing with her cat when the cat scratched her on her left forearm.  Patient states , cat's immunizations are up-to-date.  Last Tdap 02/11/2021.      [name] Patient Active Problem List   Diagnosis Date Noted   Recurrent syncope 01/21/2023   Prediabetes 02/14/2022   Stroke-like symptom 02/12/2022   Visual impairment 02/12/2022   Hypokalemia 10/14/2021   Hypomagnesemia 10/14/2021   Weakness 10/13/2021   COVID-19 virus infection 10/13/2021   Sundowning 02/14/2021   Dens fracture (HCC) 02/12/2021   Unsteady gait 02/12/2021   Hypothyroidism 02/11/2021   CAD (coronary artery disease) 02/11/2021   Multiple skin tears 02/11/2021   Chronic diastolic CHF (congestive heart failure) (HCC) 02/11/2021   Seizure (HCC) 08/25/2020   Diverticulitis 07/24/2020   Intractable nausea and vomiting 07/23/2020   Generalized weakness 07/23/2020   Achilles tendon contracture, bilateral 06/29/2017   Acute bilateral low back pain without sciatica 03/17/2016   Idiopathic chronic gout of multiple sites without tophus 03/17/2016   Idiopathic gout of multiple sites 11/27/2015   Cellulitis 05/15/2015   Bilateral leg edema 02/08/2015   Chronic anxiety 11/22/2014   Cerebrovascular accident (CVA) (HCC) 07/25/2014   Arterial vascular disease 07/25/2014   Swelling of right lower extremity 06/22/2014   Diabetes mellitus without complication (HCC)    Stroke (HCC) 06/08/2014   Anemia 06/07/2014   Unstable angina (HCC) 06/06/2014   Carotid artery disease (HCC)     Atherosclerosis of native coronary artery with stable angina pectoris (HCC)    Hyperlipidemia    Essential hypertension    Angina pectoris (HCC) 05/31/2014   SOB (shortness of breath) 04/11/2014   Palpitations 04/11/2014   Other fatigue 04/11/2014   Thyroid  cancer (HCC) 04/11/2014     ROS: Patient currently denies any vision changes, tinnitus, difficulty speaking, facial droop, sore throat, chest pain, shortness of breath, abdominal pain, nausea/vomiting/diarrhea, dysuria, or weakness/numbness/paresthesias in any extremity   Physical Exam   Triage Vital Signs: ED Triage Vitals  Encounter Vitals Group     BP 07/07/23 1516 (!) 147/58     [name] Systolic BP Percentile --      [name] Diastolic BP Percentile --      Boys Systolic BP Percentile --      Boys Diastolic BP Percentile --      Pulse Rate 07/07/23 1516 100     Resp 07/07/23 1516 18     Temp 07/07/23 1516 98.2 F (36.8 C)     Temp Source 07/07/23 1516 Oral     SpO2 07/07/23 1516 100 %     Weight 07/07/23 1517 129 lb 6.5 oz (58.7 kg)     Height 07/07/23 1517 5' 1 (1.549 m)     Head Circumference --      Peak Flow --      Pain Score 07/07/23 1517 5     Pain Loc --      Pain Education --      Exclude from Growth Chart --     Most recent  vital signs: Vitals:   07/07/23 1516  BP: (!) 147/58  Pulse: 100  Resp: 18  Temp: 98.2 F (36.8 C)  SpO2: 100%     Physical Exam Vitals and nursing note reviewed.  At triage patient had systolic hypertension, afebrile  Constitutional:      General: Awake and alert. No acute distress.    Appearance: Normal appearance. The patient is normal weight.      Able to speak in complete sentences without cough or dyspnea  HENT:     Head: Normocephalic and atraumatic.     Mouth: Mucous membranes are moist.  Eyes:     General: PERRL. Normal EOMs          Conjunctiva/sclera: Conjunctivae normal.  Nose No congestion/rhinorrhea  CV:                  Good peripheral perfusion.   Regular rate and rhythm  Resp:               Normal effort.  Equal breath sounds bilaterally.  Abd:                 No distention.  Soft, nontender.  No rebound or guarding.  Musculoskeletal:        General: No swelling. Normal range of motion.  Left forearm: Presence of 3 punctuated the lesions with no active bleeding at the level the proximal  anterior third of the forearm.  Distal anterior third of the forearm: Presence of abrasion of the skin, no active bleeding.  Full ROM sensation is intact, strength 5/5, pulses positive Skin:    General: Skin is warm and dry.     Capillary Refill: Capillary refill takes less than 2 seconds.     Findings: No rash.  Neurological:     Mental Status: The patient is awake and alert. MAE spontaneously. No gross focal neurologic deficits are appreciated.  Psychiatric Mood and affect are normal. Speech and behavior are normal.  ED Results / Procedures / Treatments   Labs (all labs ordered are listed, but only abnormal results are displayed) Labs Reviewed - No data to display   EKG     RADIOLOGY     PROCEDURES:  Critical Care performed:   Procedures   MEDICATIONS ORDERED IN ED: Medications - No data to display    IMPRESSION / MDM / ASSESSMENT AND PLAN / ED COURSE  I reviewed the triage vital signs and the nursing notes.  Differential diagnosis includes, but is not limited to, cat scratch, fracture, foreign body, cellulitis  Patient's presentation is most consistent with acute, uncomplicated illness.   [name] is a 89 y.o., female who presents today with history of cat scratch.  Cat has immunizations up-to-date.  Physical exam is reassuring, no active bleeding, no signs of infection. Patient's diagnosis is consistent with cat scratch.  I did review the patient's allergies and medications.The patient is in stable and satisfactory condition for discharge home.  Patient's Tdap is up-to-date. Patient will be discharged home  with prescriptions for Augmentin .  I did recommend the patient to check for signs of infection. Patient is to follow up with PCP as needed or otherwise directed. Patient is given ED precautions to return to the ED for any worsening or new symptoms. Discussed plan of care with patient, answered all of patient's questions, Patient agreeable to plan of care. Advised patient to take medications according to the instructions on the label. Discussed possible side effects of new  medications. Patient verbalized understanding.     FINAL CLINICAL IMPRESSION(S) / ED DIAGNOSES   Final diagnoses:  Cat scratch of forearm, left, initial encounter     Rx / DC Orders   ED Discharge Orders          Ordered    amoxicillin -clavulanate (AUGMENTIN ) 875-125 MG tablet  2 times daily        07/07/23 1634             Note:  This document was prepared using Dragon voice recognition software and may include unintentional dictation errors.   Janit Kast, PA-C 07/07/23 1636    Dorothyann Drivers, MD 07/07/23 RANDALL

## 2023-08-23 ENCOUNTER — Emergency Department
Admission: EM | Admit: 2023-08-23 | Discharge: 2023-08-23 | Disposition: A | Discharge status: Home / Self Care | Attending: Emergency Medicine | Admitting: Emergency Medicine

## 2023-08-23 ENCOUNTER — Other Ambulatory Visit: Payer: Self-pay

## 2023-08-23 ENCOUNTER — Emergency Department

## 2023-08-23 DIAGNOSIS — X500XXA Overexertion from strenuous movement or load, initial encounter: Secondary | ICD-10-CM | POA: Insufficient documentation

## 2023-08-23 DIAGNOSIS — E119 Type 2 diabetes mellitus without complications: Secondary | ICD-10-CM | POA: Diagnosis not present

## 2023-08-23 DIAGNOSIS — S76812A Strain of other specified muscles, fascia and tendons at thigh level, left thigh, initial encounter: Secondary | ICD-10-CM | POA: Diagnosis not present

## 2023-08-23 DIAGNOSIS — S76212A Strain of adductor muscle, fascia and tendon of left thigh, initial encounter: Secondary | ICD-10-CM

## 2023-08-23 DIAGNOSIS — I2510 Atherosclerotic heart disease of native coronary artery without angina pectoris: Secondary | ICD-10-CM | POA: Diagnosis not present

## 2023-08-23 DIAGNOSIS — I10 Essential (primary) hypertension: Secondary | ICD-10-CM | POA: Insufficient documentation

## 2023-08-23 DIAGNOSIS — S79922A Unspecified injury of left thigh, initial encounter: Secondary | ICD-10-CM | POA: Diagnosis present

## 2023-08-23 MED ORDER — LIDOCAINE 5 % EX PTCH: 1.0000 | MEDICATED_PATCH | CUTANEOUS | 0 refills | Status: AC

## 2023-08-23 MED ORDER — LIDOCAINE 5 % EX PTCH
1.0000 | MEDICATED_PATCH | CUTANEOUS | Status: DC
  Administered 2023-08-23 (×2): 1 via TRANSDERMAL
  Filled 2023-08-23: qty 1

## 2023-08-23 MED ORDER — ACETAMINOPHEN 500 MG PO TABS
1000.0000 mg | ORAL_TABLET | Freq: Once | ORAL | Status: AC
  Administered 2023-08-23 (×2): 1000 mg via ORAL
  Filled 2023-08-23: qty 2

## 2023-08-23 NOTE — Discharge Instructions (Addendum)
 You can take 650 mg of Tylenol  every 6 hours as needed for pain. You can use ice, heat, muscle creams and other topical pain relievers as well.  Return to the emergency department with any worsening symptoms.

## 2023-08-23 NOTE — ED Triage Notes (Signed)
 Pt comes in via pov with complaints of left groin pain that started this morning. Pt states that she woke up this morning with both groins hurting, and now it is the left side that continues. The pain comes and goes, and she complains of pain 10/10. Pt reports no falls.

## 2023-08-23 NOTE — ED Provider Notes (Signed)
 [facility] Provider Note    Event Date/Time   First MD Initiated Contact with Patient 08/23/23 1528     (approximate)   History   Groin Pain   HPI  [name] is a 89 y.o. female with PMH of CAD, hypertension, diabetes, stroke who presents for evaluation of groin pain. Patient states it began this morning when she woke up. Initially the pain was in both groins, now it has localized to the left side.  Patient states that she was moving some furniture around yesterday and thinks this may be the cause.  She denies back pain, fevers, urinary symptoms.      Physical Exam   Triage Vital Signs: ED Triage Vitals  Encounter Vitals Group     BP 08/23/23 1502 (!) 156/67     [name] Systolic BP Percentile --      [name] Diastolic BP Percentile --      Boys Systolic BP Percentile --      Boys Diastolic BP Percentile --      Pulse Rate 08/23/23 1502 78     Resp 08/23/23 1502 18     Temp 08/23/23 1502 98.5 F (36.9 C)     Temp src --      SpO2 08/23/23 1502 100 %     Weight 08/23/23 1503 129 lb 6.6 oz (58.7 kg)     Height 08/23/23 1503 5' 1 (1.549 m)     Head Circumference --      Peak Flow --      Pain Score 08/23/23 1503 10     Pain Loc --      Pain Education --      Exclude from Growth Chart --     Most recent vital signs: Vitals:   08/23/23 1502  BP: (!) 156/67  Pulse: 78  Resp: 18  Temp: 98.5 F (36.9 C)  SpO2: 100%   General: Awake, no distress.  CV:  Good peripheral perfusion.  Resp:  Normal effort.  Abd:  No distention.  Other:  Tender to palpation over the greater trochanter, able to range the hip but does elicit pain, no overlying bruises, abrasions or skin changes   ED Results / Procedures / Treatments   Labs (all labs ordered are listed, but only abnormal results are displayed) Labs Reviewed - No data to display   RADIOLOGY  Left hip x-ray obtained, interpreted the images as well as reviewed the radiologist  report see ED course for interpretation.  PROCEDURES:  Critical Care performed: No  Procedures   MEDICATIONS ORDERED IN ED: Medications  lidocaine  (LIDODERM ) 5 % 1 patch (1 patch Transdermal Patch Applied 08/23/23 1601)  acetaminophen  (TYLENOL ) tablet 1,000 mg (1,000 mg Oral Given 08/23/23 1600)     IMPRESSION / MDM / ASSESSMENT AND PLAN / ED COURSE  I reviewed the triage vital signs and the nursing notes.                             89 year old female presents for evaluation of groin pain.  Blood pressure is little bit elevated otherwise vital signs are stable.  Patient NAD on exam.  Differential diagnosis includes, but is not limited to, muscle strain, hip fracture, hip dislocation, osteoarthritis.  Patient's presentation is most consistent with acute complicated illness / injury requiring diagnostic workup.  X-ray of the hip is negative for fractures but does show some arthritis.  Suspect the patient's  pain is most likely due to a muscle strain.  Since she is on a blood thinner advised her to not take any NSAIDs.  She can continue to take Tylenol  and use lidocaine  patches which I will prescribe.  Also advised heat and ice.  Patient voiced understanding, all questions were answered she was stable at discharge.   Clinical Course as of 08/23/23 1835  Mon Aug 23, 2023  1751 DG Hip Unilat With Pelvis 2-3 Views Left Negative, but does show osteoarthritis. [LD]    Clinical Course User Index [LD] Cleaster Tinnie LABOR, PA-C     FINAL CLINICAL IMPRESSION(S) / ED DIAGNOSES   Final diagnoses:  Inguinal strain, left, initial encounter     Rx / DC Orders   ED Discharge Orders          Ordered    lidocaine  (LIDODERM ) 5 %  Every 24 hours        08/23/23 1835             Note:  This document was prepared using Dragon voice recognition software and may include unintentional dictation errors.   Cleaster Tinnie LABOR, PA-C 08/23/23 CATHLYN Dorothyann Drivers, MD 08/23/23  2251

## 2023-08-23 NOTE — ED Notes (Signed)
 See triage note  Presents with pain to left groin area States pain started on right now mainly to left  Pain increases with movement denies any fall

## 2023-08-28 ENCOUNTER — Emergency Department

## 2023-08-28 ENCOUNTER — Other Ambulatory Visit: Payer: Self-pay

## 2023-08-28 ENCOUNTER — Emergency Department
Admission: EM | Admit: 2023-08-28 | Discharge: 2023-08-28 | Disposition: A | Discharge status: Home / Self Care | Attending: Emergency Medicine | Admitting: Emergency Medicine

## 2023-08-28 DIAGNOSIS — I6782 Cerebral ischemia: Secondary | ICD-10-CM | POA: Insufficient documentation

## 2023-08-28 DIAGNOSIS — S0990XA Unspecified injury of head, initial encounter: Secondary | ICD-10-CM | POA: Diagnosis not present

## 2023-08-28 DIAGNOSIS — Z79899 Other long term (current) drug therapy: Secondary | ICD-10-CM | POA: Insufficient documentation

## 2023-08-28 DIAGNOSIS — S51011A Laceration without foreign body of right elbow, initial encounter: Secondary | ICD-10-CM | POA: Diagnosis present

## 2023-08-28 DIAGNOSIS — W0110XA Fall on same level from slipping, tripping and stumbling with subsequent striking against unspecified object, initial encounter: Secondary | ICD-10-CM | POA: Insufficient documentation

## 2023-08-28 DIAGNOSIS — W19XXXA Unspecified fall, initial encounter: Secondary | ICD-10-CM

## 2023-08-28 DIAGNOSIS — Z7902 Long term (current) use of antithrombotics/antiplatelets: Secondary | ICD-10-CM | POA: Diagnosis not present

## 2023-08-28 DIAGNOSIS — Y92481 Parking lot as the place of occurrence of the external cause: Secondary | ICD-10-CM | POA: Insufficient documentation

## 2023-08-28 DIAGNOSIS — I10 Essential (primary) hypertension: Secondary | ICD-10-CM | POA: Insufficient documentation

## 2023-08-28 NOTE — ED Triage Notes (Addendum)
 BIBEMS, coming from [facility]. Fell in parking lot, Pt got tangled up with walker. Skin tear noted to RFA and R elbow, dressing applied by EMS, bleeding controlled. Denies LOC. Pt is on blood thinner. S180s. BGL: 131. PMH: HTN. PT denies CP, SOB, Dizziness. GCS 15

## 2023-08-28 NOTE — ED Provider Notes (Signed)
 [place] EMERGENCY DEPARTMENT AT [facility] Provider Note   CSN: [id number] Arrival date & time: 08/28/23  1202     Patient presents with: [name] Barri Neidlinger is a 89 y.o. female.   Patient here for fall.  Mechanical fall earlier today getting out of car got tangled up and lost control of walker.  Fell hitting right elbow now has skin tear.  Unsure if she hit head but denies any loss of consciousness no headache no head pain or bleeding no neck pain.  Does take Plavix  blood thinner.  No chest or abdominal pain.  Denying injuries elsewhere.   Fall Pertinent negatives include no chest pain, no abdominal pain, no headaches and no shortness of breath.       Prior to Admission medications   Medication Sig Start Date End Date Taking? Authorizing Provider  acetaminophen  (TYLENOL ) 500 MG tablet Take 500-1,000 mg by mouth every 6 (six) hours as needed for mild pain or moderate pain.    [provider]  albuterol  (VENTOLIN  HFA) 108 (90 Base) MCG/ACT inhaler Inhale 2 puffs into the lungs every 6 (six) hours as needed for wheezing or shortness of breath. 10/15/21   Jhonny Calvin NOVAK, MD  allopurinol  (ZYLOPRIM ) 100 MG tablet TAKE 1 TABLET(100 MG) BY MOUTH TWICE DAILY 11/14/18   Harden Jerona GAILS, MD  azithromycin  (ZITHROMAX  Z-PAK) 250 MG tablet 2 pills today then 1 pill a day for 4 days 04/07/23   Gasper Devere ORN, PA-C  busPIRone (BUSPAR) 10 MG tablet Take 10 mg by mouth 2 (two) times daily.    [provider]  clopidogrel  (PLAVIX ) 75 MG tablet TAKE 1 TABLET BY MOUTH DAILY 05/12/23   Gollan, Timothy J, MD  Colchicine  (MITIGARE ) 0.6 MG CAPS Take 0.6 mg by mouth daily as needed. 03/05/20   Gollan, Timothy J, MD  cyproheptadine (PERIACTIN) 4 MG tablet Take by mouth.    [provider]  diphenhydrAMINE -zinc  acetate (BENADRYL  EXTRA STRENGTH) cream Apply 1 Application topically 3 (three) times daily as needed for itching. 06/14/23   Janit Kast, PA-C   Evolocumab  (REPATHA  SURECLICK) 140 MG/ML SOAJ Inject 140 mg into the skin every 14 (fourteen) days. Patient not taking: Reported on 01/21/2023 08/10/22   Gollan, Timothy J, MD  famotidine  (PEPCID ) 20 MG tablet Take 1 tablet (20 mg total) by mouth 2 (two) times daily. 06/14/23   Evans, Alexandra, PA-C  furosemide  (LASIX ) 20 MG tablet TAKE 1 TABLET BY MOUTH DAILY AS  NEEDED FOR FLUID (TAKE WITH  POTASSIUM) 06/04/23   Arlander Charleston, MD  gabapentin  (NEURONTIN ) 100 MG capsule Take 100-200 mg by mouth at bedtime as needed. Patient not taking: Reported on 03/31/2022 02/04/22   [provider]  hydrocortisone  cream 1 % Apply 1 Application topically 2 (two) times daily. 05/22/23   Clarine Ozell LABOR, MD  hydrOXYzine  (ATARAX ) 25 MG tablet Take 1 tablet (25 mg total) by mouth 3 (three) times daily. 03/31/22   Gollan, Timothy J, MD  levETIRAcetam  (KEPPRA ) 250 MG tablet Take 1 tablet (250 mg total) by mouth 2 (two) times daily. 08/27/20   Amin, Sumayya, MD  levothyroxine  (SYNTHROID ) 100 MCG tablet Take 100 mcg by mouth daily. 09/17/21   [provider]  lidocaine  (LIDODERM ) 5 % Place 1 patch onto the skin daily for 10 days. Remove & Discard patch within 12 hours or as directed by MD 08/23/23 09/02/23  Cleaster Tinnie LABOR, PA-C  metoprolol  succinate (TOPROL -XL) 25 MG 24 hr tablet Take 1 tablet (  25 mg total) by mouth daily. 04/05/23   Gollan, Timothy J, MD  montelukast  (SINGULAIR ) 10 MG tablet TAKE 1 TABLET(10 MG) BY MOUTH EVERY MORNING Patient not taking: No sig reported 02/19/22   Hester Alm BROCKS, MD  Multiple Vitamins-Minerals (ICAPS AREDS 2 PO) Take 1 tablet by mouth 2 (two) times daily.    [provider]  nitroGLYCERIN  (NITROSTAT ) 0.4 MG SL tablet DISSOLVE ONE TABLET UNDER TONGUE AS NEEDED FOR CHEST PAIN EVERY 5 MINUTES 05/18/23   Gollan, Timothy J, MD  potassium chloride  (KLOR-CON ) 10 MEQ tablet TAKE 1 TABLET BY MOUTH DAILY AS  NEEDED FOR NUTRIENT  REPLENISHMENT TAKE WITH  FUROSEMIDE  Patient not  taking: Reported on 01/22/2023 03/31/22   Gollan, Timothy J, MD  rosuvastatin  (CRESTOR ) 5 MG tablet TAKE 1 TABLET(5 MG) BY MOUTH DAILY 04/05/23   Gollan, Timothy J, MD  traZODone  (DESYREL ) 50 MG tablet Take 0.5 tablets (25 mg total) by mouth at bedtime as needed for sleep. Patient not taking: Reported on 04/05/2023 02/14/21   Josette Ade, MD  triamcinolone ointment (KENALOG) 0.1 % Apply topically 2 (two) times daily. 02/04/22   [provider]    Allergies: Ciprofloxacin, Codeine, Diaper rash products, Metronidazole, Other, Sulfa antibiotics, Neomycin-bacitracin  zn-polymyx, Bacitracin -polymyxin b, Celecoxib, Neomycin, Statins, and Vioxx [rofecoxib]    Review of Systems  Constitutional:  Negative for chills and fever.  HENT:  Negative for congestion.   Respiratory:  Negative for cough and shortness of breath.   Cardiovascular:  Negative for chest pain.  Gastrointestinal:  Negative for abdominal pain.  Genitourinary:  Negative for dysuria.  Musculoskeletal:  Positive for arthralgias.  Skin:  Positive for wound.  Neurological:  Negative for weakness, numbness and headaches.    Updated Vital Signs BP (!) 158/90   Pulse 74   Temp 97.9 F (36.6 C) (Oral)   Resp 18   SpO2 100%   Physical Exam Vitals reviewed.  Constitutional:      Appearance: Normal appearance.  HENT:     Head: Normocephalic and atraumatic.     Nose: Nose normal.  Cardiovascular:     Pulses: Normal pulses.  Pulmonary:     Effort: Pulmonary effort is normal.  Abdominal:     General: There is no distension.     Tenderness: There is no abdominal tenderness. There is no guarding.  Musculoskeletal:     Cervical back: Normal range of motion.     Comments: Mild tenderness of right elbow.  Full range of motion.  Distal pulse and sensation intact.  3 cm lateral skin tear with friable tissue mild bleeding.  No cervical thoracic or lumbar pain or tenderness no paraspinal pain or tenderness  Neurological:      Mental Status: She is alert and oriented to person, place, and time. Mental status is at baseline.  Psychiatric:        Mood and Affect: Mood normal.        Behavior: Behavior normal.     (all labs ordered are listed, but only abnormal results are displayed) Labs Reviewed - No data to display  EKG: None  Radiology: CT Head Wo Contrast Result Date: 08/28/2023 CLINICAL DATA:  Head trauma, minor (Age >= 65y). Fall in parking lot. EXAM: CT HEAD WITHOUT CONTRAST TECHNIQUE: Contiguous axial images were obtained from the base of the skull through the vertex without intravenous contrast. RADIATION DOSE REDUCTION: This exam was performed according to the departmental dose-optimization program which includes automated exposure control, adjustment of the mA and/or kV  according to patient size and/or use of iterative reconstruction technique. COMPARISON:  Head CT 04/26/2023. Head MRI 02/13/2022 and 02/12/2022. FINDINGS: Brain: There is no evidence of an acute infarct, intracranial hemorrhage, mass, midline shift, or extra-axial fluid collection. Confluent hypodensities in the cerebral white matter are unchanged and nonspecific but compatible with extensive chronic small vessel ischemic disease. A known small chronic left cerebellar infarct is not well demonstrated by CT. There is mild-to-moderate cerebral atrophy. Vascular: Calcified atherosclerosis at the skull base. No hyperdense vessel. Skull: No acute fracture or suspicious lesion. Sinuses/Orbits: Visualized paranasal sinuses and mastoid air cells are clear. Bilateral cataract extraction. Other: None. IMPRESSION: 1. No evidence of acute intracranial abnormality. 2. Extensive chronic small vessel ischemic disease. Electronically Signed   By: Dasie Hamburg M.D.   On: 08/28/2023 13:25   DG Elbow Complete Right Result Date: 08/28/2023 CLINICAL DATA:  injury EXAM: RIGHT ELBOW - COMPLETE 3+ VIEW COMPARISON:  None Available. FINDINGS: There is no evidence of  fracture, dislocation, or joint effusion. Cortical irregularity of the radial neck likely old healed fracture versus degenerative changes. Degenerative changes of the elbow. Soft tissues are unremarkable. Dermal calcifications. IMPRESSION: No acute displaced fracture or dislocation. Electronically Signed   By: Morgane  Naveau M.D.   On: 08/28/2023 13:01     Procedures   Medications Ordered in the ED - No data to display                                  Medical Decision Making Patient here for mechanical fall with right elbow injury skin tear.  Neurovasculature is intact.  Will check x-ray.  The skin tear appears quite superficial not amenable to suture repair.  Will apply petroleum gauze and wrap.  Head CT obtained out of abundance of caution because patient is not quite sure if she had a head injury.  Head CT is clear.  Elbow x-ray is nonacute.  Will recommend symptomatic treatment wound care for the skin tear.  Follow-up with primary.  Given return precautions.  Amount and/or Complexity of Data Reviewed Radiology: ordered.        Final diagnoses:  Fall, initial encounter  Skin tear of right elbow without complication, initial encounter    ED Discharge Orders     None          [place] Mallick, [place] 08/28/23 1407    Jacolyn Pae, MD 08/28/23 [phone]

## 2023-08-28 NOTE — Discharge Instructions (Signed)
 Please clean with soap and warm water.  Apply topical antibiotic.  Follow-up with primary care for recheck in the next few days.  Return here for new or worse symptoms

## 2023-08-30 ENCOUNTER — Emergency Department
Admission: EM | Admit: 2023-08-30 | Discharge: 2023-08-30 | Disposition: A | Discharge status: Home / Self Care | Attending: Emergency Medicine | Admitting: Emergency Medicine

## 2023-08-30 ENCOUNTER — Other Ambulatory Visit: Payer: Self-pay

## 2023-08-30 DIAGNOSIS — Z85850 Personal history of malignant neoplasm of thyroid: Secondary | ICD-10-CM | POA: Insufficient documentation

## 2023-08-30 DIAGNOSIS — I10 Essential (primary) hypertension: Secondary | ICD-10-CM | POA: Insufficient documentation

## 2023-08-30 DIAGNOSIS — Z8673 Personal history of transient ischemic attack (TIA), and cerebral infarction without residual deficits: Secondary | ICD-10-CM | POA: Insufficient documentation

## 2023-08-30 DIAGNOSIS — H5712 Ocular pain, left eye: Secondary | ICD-10-CM | POA: Insufficient documentation

## 2023-08-30 MED ORDER — FLUORESCEIN SODIUM 1 MG OP STRP
1.0000 | ORAL_STRIP | Freq: Once | OPHTHALMIC | Status: AC
  Administered 2023-08-30: 1 via OPHTHALMIC
  Filled 2023-08-30: qty 1

## 2023-08-30 MED ORDER — TETRACAINE HCL 0.5 % OP SOLN
2.0000 [drp] | Freq: Once | OPHTHALMIC | Status: AC
  Administered 2023-08-30: 2 [drp] via OPHTHALMIC
  Filled 2023-08-30: qty 4

## 2023-08-30 NOTE — ED Provider Notes (Signed)
 [facility] Emergency Department Provider Note     Event Date/Time   First MD Initiated Contact with Patient 08/30/23 1832     (approximate)   History   Eye Problem   HPI  [name] is a 89 y.o. female with a history of thyroid  cancer, CVA, HTN, HLD, presents to the ED endorsing pressure and pain to her left eye.  Patient was advised by her eye professional to report to the ED for evaluation after she presented there for initial evaluation.  Patient denies any associated headache, vision loss, jaw claudication, fever, chills, or sweats.  No facial droop or paralysis reported.  Patient reports similar symptoms when she had a stroke in the past.  Patient was evaluated here in the ED 2 days prior for mechanical fall without frank head injury.  A CT head was performed which showed no acute intracranial process.   Physical Exam   Triage Vital Signs: ED Triage Vitals  Encounter Vitals Group     BP 08/30/23 1653 (!) 160/91     [name] Systolic BP Percentile --      [name] Diastolic BP Percentile --      Boys Systolic BP Percentile --      Boys Diastolic BP Percentile --      Pulse Rate 08/30/23 1653 81     Resp 08/30/23 1653 17     Temp 08/30/23 1653 98 F (36.7 C)     Temp Source 08/30/23 1653 Oral     SpO2 08/30/23 1653 100 %     Weight 08/30/23 1652 129 lb 6.6 oz (58.7 kg)     Height 08/30/23 1652 5' 1 (1.549 m)     Head Circumference --      Peak Flow --      Pain Score 08/30/23 1653 6     Pain Loc --      Pain Education --      Exclude from Growth Chart --     Most recent vital signs: Vitals:   08/30/23 1653 08/30/23 2137  BP: (!) 160/91 (!) 191/75  Pulse: 81 87  Resp: 17 18  Temp: 98 F (36.7 C) 98.4 F (36.9 C)  SpO2: 100% 99%    General Awake, no distress. NAD HEENT NCAT. PERRL. EOMI. normal fundi bilaterally.  No rhinorrhea. Mucous membranes are moist.  CV:  Good peripheral perfusion. RRR RESP:  Normal effort.  CTA ABD:  No distention.  NEURO: Cranial nerves II to XII grossly intact.   ED Results / Procedures / Treatments   Labs (all labs ordered are listed, but only abnormal results are displayed) Labs Reviewed - No data to display   EKG   RADIOLOGY  No results found.   PROCEDURES:  Critical Care performed: No  Procedures   MEDICATIONS ORDERED IN ED: Medications  tetracaine  (PONTOCAINE) 0.5 % ophthalmic solution 2 drop (2 drops Right Eye Given by Other 08/30/23 1844)  fluorescein  ophthalmic strip 1 strip (1 strip Both Eyes Given by Other 08/30/23 1844)     IMPRESSION / MDM / ASSESSMENT AND PLAN / ED COURSE  I reviewed the triage vital signs and the nursing notes.                              Differential diagnosis includes, but is not limited to, acute closed angle glaucoma, corneal abrasion, eye trauma, headache, retinal artery occlusion  Patient's presentation is  most consistent with acute complicated illness / injury requiring diagnostic workup.  Patient's diagnosis is consistent with acute left eye pain.  Patient with recent exam and workup at this time.  Patient overall reports that her symptoms are improved and without progression.  She denies any change to her baseline vision on the left.  Low concern for acute retinal artery occlusion as patient has no vision loss.  Eye pain appears consistent with similar pain she has had in the past.  No concern for an acute hemorrhagic stroke as patient had a recent reassuring head CT and no interim trauma.  No concern for temporal arteritis or optic neuritis as patient's overall exam is reassuring without vision loss, EOM pain, or funduscopic abnormality.  We discussed the options for further imaging, but ultimately patient along with her husband, decided to forego imaging and follow-up with the ophthalmologist tomorrow as suggested.  Patient will be discharged home with instructions to take her home meds and use her eyedrops as  prescribed. Patient is to follow up with ophthalmology as suggested, as needed or otherwise directed. Patient is given ED precautions to return to the ED for any worsening or new symptoms.  FINAL CLINICAL IMPRESSION(S) / ED DIAGNOSES   Final diagnoses:  Left eye pain     Rx / DC Orders   ED Discharge Orders     None        Note:  This document was prepared using Dragon voice recognition software and may include unintentional dictation errors.    Loyd Candida LULLA Aldona, PA-C 08/30/23 2314    Dorothyann Drivers, MD 08/31/23 2200

## 2023-08-30 NOTE — Discharge Instructions (Addendum)
 Your exam is normal at this time. You should follow-up with your eye care provider as discussed. Return to the ED if needed.

## 2023-08-30 NOTE — ED Triage Notes (Signed)
 Pt sts that she has been having pressure and pain in her left eye. Pt sts that she went to see her eye dr and they advised her to come to the ED.

## 2023-10-29 NOTE — Progress Notes (Signed)
 Chief Complaint: Chief Complaint  Patient presents with  . Neck Itchy & Burning    X 1 Month Only The Back Of The Neck No Pain, Just Burning & Itching Pt Has Tried Multiple Lotions & Creams.Only Helps For A Short Time No Bites Or Open Wounds No Head Or Ear Pain No Fever Pt States She Hasn't Used Any New Products Or Any New Medications    History of Present Illness Examination of the cervical spineBetty W [name] is an 89 year old female who presents with neck pain and itching following multiple falls.  She experiences significant neck pain and itching, described as stinging and burning. The pain is associated with a sensation that her skull is misaligned over her neck. She has difficulty maintaining hair hygiene due to the itching and frequently scratches her head at the base of the skull.  She has had three significant falls in the past month, raising concerns about potential impact on previous surgical hardware although she has no surgical hardware in her neck only her lower back and denies any back pain. She is uncertain if any of her six back surgeries involved her neck. The neck pain has persisted for over a month.  No head injury LOC nausea or vomiting, headache during her falls  There is no numbness or tingling in her hands or arms. She can turn her head left and right, though sometimes with difficulty. Muscle spasms and tightness are present in her neck, particularly in one area.  No numbness or tingling in the lower legs.  No weakness or loss of bowel or bladder symptoms.  For pain management, she takes Tylenol  and is not on any other pain medication. She denies any loss of consciousness during her falls and has not experienced severe headaches, only neck pain.      Past Medical History: Past Medical History:  Diagnosis Date  . Atherosclerotic heart disease of native coronary artery without angina pectoris   . Barrett's esophagus   . CAD (coronary artery disease)   . CHF  (congestive heart failure) (CMS/HHS-HCC)   . Chronic anxiety   . Chronic gouty arthritis   . Diverticulosis   . Environmental allergies   . Esophagitis   . History of anemia    normocytic chronic anemia  . History of bone density study 11/04/2009  . History of colon polyps   . History of stroke   . Hyperlipidemia   . Hypertension   . Macular degeneration of both eyes   . Mild reactive airways disease (HHS-HCC)   . Osteoarthritis    carpal tunnel, lumbar spine  . Palpitations    recurrent  . Peripheral neuropathy   . Recurrent syncope   . Seizure (CMS/HHS-HCC)   . Type 2 diabetes mellitus (CMS/HHS-HCC)     Past Surgical History: Past Surgical History:  Procedure Laterality Date  . COLONOSCOPY  05/11/2007   Ademonatous Polyp, FH Colon Polyps (Mother)  . COLONOSCOPY  04/03/2009   Aborted due to poor prep  . EGD  04/03/2009   Barrett's Esophagus  . COLONOSCOPY  04/11/2009   PH Adenomatous Polyp, FH Colon Polyps (Mother): CBF 03/2014; recall ltr mailed 02/12/2014 (dw)  . EGD  11/17/2011   No Barrett's Seen: No repeat per RTE  .  right knee surgery    . CHOLECYSTECTOMY    . EGD  05/11/2007, 11/27/1992   No repeat per RTE  . Excision of ganglion cyst from the right [facility] joint: 08/27/2006    . History  of breast implants S/P removal in 6/03    . HYSTERECTOMY    . LAMINECTOMY LUMBAR SPINE    . Status post right rotator cuff surgery      Past Family History: Family History  Problem Relation Age of Onset  . Colon polyps Mother   . Diabetes type II Mother   . High blood pressure (Hypertension) Mother   . Osteoporosis (Thinning of bones) Mother   . GI problems Mother   . High blood pressure (Hypertension) Father   . Colon polyps Sister   . Heart disease Sister   . Prostate cancer Other     Medications: Current Outpatient Medications  Medication Sig Dispense Refill  . allopurinoL  (ZYLOPRIM ) 100 MG tablet TAKE 1 TABLET(100 MG) BY MOUTH TWICE DAILY 180 tablet 1  .  clopidogreL  (PLAVIX ) 75 mg tablet Take 1 tablet (75 mg total) by mouth once daily 30 tablet 11  . colchicine  (MITIGARE ) 0.6 mg Cap Take 1 capsule by mouth 2 (two) times daily as needed 60 capsule 1  . cyproheptadine (PERIACTIN) 4 mg tablet TAKE 1 TABLET(4 MG) BY MOUTH THREE TIMES DAILY AS NEEDED FOR ITCHING 90 tablet 2  . FUROsemide  (LASIX ) 20 MG tablet TAKE 1 TABLET(20 MG) BY MOUTH EVERY DAY AS NEEDED FOR SWELLING 30 tablet 3  . hydrocortisone  1 % cream Apply topically    . levETIRAcetam  (KEPPRA ) 250 MG tablet Take 1 tablet (250 mg total) by mouth every 12 (twelve) hours 60 tablet 11  . levothyroxine  (SYNTHROID ) 100 MCG tablet Take 1 tablet (100 mcg total) by mouth once daily Take on an empty stomach with a glass of water at least 30-60 minutes before breakfast. 90 tablet 1  . nitroGLYcerin  (NITROSTAT ) 0.4 MG SL tablet PLACE 1 TABLET UNDER THE TONGUE EVERY 5 MINUTES AS NEEDED FOR CHEST PAIN    . potassium chloride  (KLOR-CON ) 10 MEQ ER tablet Take 10 mEq by mouth once daily    . triamcinolone 0.1 % ointment Apply topically 2 (two) times daily 30 g 2  . busPIRone (BUSPAR) 10 MG tablet Take 1 tablet (10 mg total) by mouth 2 (two) times daily (Patient not taking: Reported on 10/29/2023) 60 tablet 5  . isosorbide  mononitrate (IMDUR ) 30 MG ER tablet Take 1 tablet (30 mg total) by mouth once daily (Patient not taking: Reported on 10/29/2023) 30 tablet 5  . metoprolol  SUCCinate (TOPROL -XL) 25 MG XL tablet Take 1 tablet by mouth once daily    . metoprolol  succinate (TOPROL -XL) 50 MG XL tablet Take 1 tablet (50 mg total) by mouth once daily (Patient not taking: Reported on 09/27/2023) 30 tablet 11   No current facility-administered medications for this visit.    Allergies: Allergies  Allergen Reactions  . Codeine Other (See Comments)    Other Reaction: GI Upset  . Metronidazole Diarrhea, Nausea And Vomiting and Nausea  . Other Hives and Other (See Comments)    Distress  . Sulfa (Sulfonamide  Antibiotics) Hives, Unknown and Other (See Comments)    Distress  Distress  Distress  Distress Distress  Other reaction(s): Not available  . Celebrex [Celecoxib] Unknown  . Ciprofloxacin Nausea and Vomiting  . Flagyl [Metronidazole Hcl] Nausea and Vomiting  . Meloxicam  Unknown  . Neomycin Itching  . Neomycin-Bacitracin -Polymyxin Unknown and Other (See Comments)    Reaction:  Unknown  Reaction:  Unknown  Reaction:  Unknown   . Neosporin [Neomycin-Polymyxin-Gramicidin] Unknown  . Zetia  [Ezetimibe ] Muscle Pain  . Bacitracin -Polymyxin B Rash  . Rofecoxib Itching and  Rash  . Statins-Hmg-Coa Reductase Inhibitors Unknown and Other (See Comments)    Reaction:  Muscle pain Reaction:  Muscle pain Reaction:  Muscle pain Reaction:  Muscle pain  . Zinc  Oxide-White Petrolatum Hives     Review of Systems:  A comprehensive 14 point ROS was performed, reviewed by me today, and the pertinent orthopaedic findings are documented in the HPI.   Exam: BP (!) 150/70 (BP Location: Left upper arm) Comment (BP Location): Manual  Pulse 72   Temp 36.6 C (97.9 F) (Oral)   Ht 154.9 cm (5' 1)   Wt 61.2 kg (135 lb)   SpO2 96%   BMI 25.51 kg/m  General/Constitutional: The patient appears to be well-nourished, well-developed, and in no acute distress. Neuro/Psych: Normal mood and affect, oriented to person, place and time. Eyes: Non-icteric.  Pupils are equal, round, and reactive to light, and exhibit synchronous movement. ENT: Unremarkable. Lymphatic: No palpable adenopathy. Respiratory: Non-labored breathing Cardiovascular: No edema, swelling or tenderness, except as noted in detailed exam. Integumentary: No impressive skin lesions present, except as noted in detailed exam. Musculoskeletal: Unremarkable, except as noted in detailed exam.  General: Well developed, well nourished 89 y.o. female in no apparent distress.  Normal affect.  Normal communication.  Patient answers questions  appropriately.  The patient has a normal gait.  There is no antalgic component.    Cervical Spine: Examination of the cervical spine reveals no bony abnormality, no edema, and no ecchymosis.  There is no step-off.  Mild loss of cervical flexion, extension with normal cervical rotation to the left into the right.  There is no crepitus with range of motion exercises.  The patient is non-tender along the spinous process to palpation.  Mild left and right paravertebral muscle tenderness noted.  There is no parascapular discomfort.  The patient has a negative axial compression test.  The patient has a negative Spurling test.  The patient has a negative overhead arm test for thoracic outlet syndrome.    Left and right upper Extremity: Examination of the right shoulder and arm showed no bony abnormality or edema.  The patient has normal active and passive motion with abduction, flexion, internal rotation, and external rotation.  The patient has no tenderness with motion.  The patient has a negative Hawkins test and a negative impingement test.  The patient has a negative drop arm test.  The patient is non-tender along the deltoid muscle.  There is no subacromial space tenderness with no AC joint tenderness.  The patient has no instability of the shoulder with anterior-posterior motion.  There is a negative sulcus sign.  The rotator cuff muscle strength is 5/5 with supraspinatus, 5/5 with internal rotation, and 5/5 with external rotation.  There is no crepitus with range of motion activities.       Cervical x-rays ordered and reviewed by me today and sent for overread show advanced degenerative changes of the cervical spine with spondylosis spondylolisthesis and disc base degeneration.  No evidence of acute bony abnormality or significant progression of spondylolisthesis when compared to previous x-rays from 3 years ago.  Impression: Cervicalgia [M54.2] Cervicalgia  (primary encounter diagnosis)  Plan:   Assessment & Plan Cervical degenerative disc disease and osteoarthritis with neck pain, muscle spasm, and pruritus Chronic cervical degenerative disc disease and osteoarthritis with recent exacerbation. Imaging shows degenerative changes at C2-C6. No fracture or acute injury. Differential includes muscular spasm and inflammation. No neurological deficits. - Order neck x-ray to assess for acute changes or  hardware issues. - Prescribe prednisone  taper for inflammation and pruritus. - Advise use of heating pad and topical anti-inflammatory rubs for symptomatic relief. - Instruct to follow up with orthopedics if symptoms do not improve in a week or worsen. - Advise to seek emergency care if weakness in arms or legs or severe pain occurs.    This note was generated in part with voice recognition software and I apologize for any typographical errors that were not detected and corrected.   Debby Lonni Amber MPA-C

## 2023-10-31 ENCOUNTER — Other Ambulatory Visit: Payer: Self-pay

## 2023-10-31 ENCOUNTER — Emergency Department

## 2023-10-31 ENCOUNTER — Emergency Department
Admission: EM | Admit: 2023-10-31 | Discharge: 2023-10-31 | Disposition: A | Discharge status: Home / Self Care | Attending: Emergency Medicine | Admitting: Emergency Medicine

## 2023-10-31 DIAGNOSIS — R208 Other disturbances of skin sensation: Secondary | ICD-10-CM | POA: Diagnosis present

## 2023-10-31 DIAGNOSIS — I10 Essential (primary) hypertension: Secondary | ICD-10-CM | POA: Diagnosis not present

## 2023-10-31 DIAGNOSIS — M5412 Radiculopathy, cervical region: Secondary | ICD-10-CM | POA: Insufficient documentation

## 2023-10-31 DIAGNOSIS — M5410 Radiculopathy, site unspecified: Secondary | ICD-10-CM

## 2023-10-31 DIAGNOSIS — I2510 Atherosclerotic heart disease of native coronary artery without angina pectoris: Secondary | ICD-10-CM | POA: Diagnosis not present

## 2023-10-31 DIAGNOSIS — R531 Weakness: Secondary | ICD-10-CM | POA: Insufficient documentation

## 2023-10-31 LAB — URINALYSIS, W/ REFLEX TO CULTURE (INFECTION SUSPECTED)
Bilirubin Urine: NEGATIVE
Glucose, UA: >=500 mg/dL — AB
Ketones, ur: NEGATIVE mg/dL
Leukocytes,Ua: NEGATIVE
Nitrite: NEGATIVE
Protein, ur: 100 mg/dL — AB
Specific Gravity, Urine: 1.002 — ABNORMAL LOW (ref 1.005–1.030)
pH: 5 (ref 5.0–8.0)

## 2023-10-31 LAB — CBC WITH DIFFERENTIAL/PLATELET
Abs Immature Granulocytes: 0.05 K/uL (ref 0.00–0.07)
Basophils Absolute: 0 K/uL (ref 0.0–0.1)
Basophils Relative: 0 %
Eosinophils Absolute: 0 K/uL (ref 0.0–0.5)
Eosinophils Relative: 0 %
HCT: 33.7 % — ABNORMAL LOW (ref 36.0–46.0)
Hemoglobin: 10.8 g/dL — ABNORMAL LOW (ref 12.0–15.0)
Immature Granulocytes: 1 %
Lymphocytes Relative: 5 %
Lymphs Abs: 0.5 K/uL — ABNORMAL LOW (ref 0.7–4.0)
MCH: 29.6 pg (ref 26.0–34.0)
MCHC: 32 g/dL (ref 30.0–36.0)
MCV: 92.3 fL (ref 80.0–100.0)
Monocytes Absolute: 0.1 K/uL (ref 0.1–1.0)
Monocytes Relative: 1 %
Neutro Abs: 9.2 K/uL — ABNORMAL HIGH (ref 1.7–7.7)
Neutrophils Relative %: 93 %
Platelets: 220 K/uL (ref 150–400)
RBC: 3.65 MIL/uL — ABNORMAL LOW (ref 3.87–5.11)
RDW: 13.6 % (ref 11.5–15.5)
WBC: 9.8 K/uL (ref 4.0–10.5)
nRBC: 0 % (ref 0.0–0.2)

## 2023-10-31 LAB — COMPREHENSIVE METABOLIC PANEL WITH GFR
ALT: 17 U/L (ref 0–44)
AST: 37 U/L (ref 15–41)
Albumin: 4 g/dL (ref 3.5–5.0)
Alkaline Phosphatase: 65 U/L (ref 38–126)
Anion gap: 16 — ABNORMAL HIGH (ref 5–15)
BUN: 29 mg/dL — ABNORMAL HIGH (ref 8–23)
CO2: 19 mmol/L — ABNORMAL LOW (ref 22–32)
Calcium: 9.3 mg/dL (ref 8.9–10.3)
Chloride: 104 mmol/L (ref 98–111)
Creatinine, Ser: 1.01 mg/dL — ABNORMAL HIGH (ref 0.44–1.00)
GFR, Estimated: 54 mL/min — ABNORMAL LOW (ref >60)
Glucose, Bld: 266 mg/dL — ABNORMAL HIGH (ref 70–99)
Potassium: 3.7 mmol/L (ref 3.5–5.1)
Sodium: 139 mmol/L (ref 135–145)
Total Bilirubin: 0.5 mg/dL (ref 0.0–1.2)
Total Protein: 7.3 g/dL (ref 6.5–8.1)

## 2023-10-31 LAB — BLOOD GAS, VENOUS
Acid-base deficit: 3 mmol/L — ABNORMAL HIGH (ref 0.0–2.0)
Bicarbonate: 21.1 mmol/L (ref 20.0–28.0)
O2 Saturation: 96.2 %
Patient temperature: 37
pCO2, Ven: 34 mmHg — ABNORMAL LOW (ref 44–60)
pH, Ven: 7.4 (ref 7.25–7.43)
pO2, Ven: 70 mmHg — ABNORMAL HIGH (ref 32–45)

## 2023-10-31 LAB — TROPONIN I (HIGH SENSITIVITY)
Troponin I (High Sensitivity): 12 ng/L (ref <18)
Troponin I (High Sensitivity): 19 ng/L — ABNORMAL HIGH (ref <18)

## 2023-10-31 LAB — BASIC METABOLIC PANEL WITH GFR
Anion gap: 14 (ref 5–15)
BUN: 28 mg/dL — ABNORMAL HIGH (ref 8–23)
CO2: 21 mmol/L — ABNORMAL LOW (ref 22–32)
Calcium: 9.3 mg/dL (ref 8.9–10.3)
Chloride: 107 mmol/L (ref 98–111)
Creatinine, Ser: 0.97 mg/dL (ref 0.44–1.00)
GFR, Estimated: 56 mL/min — ABNORMAL LOW (ref >60)
Glucose, Bld: 225 mg/dL — ABNORMAL HIGH (ref 70–99)
Potassium: 3.9 mmol/L (ref 3.5–5.1)
Sodium: 142 mmol/L (ref 135–145)

## 2023-10-31 LAB — BETA-HYDROXYBUTYRIC ACID: Beta-Hydroxybutyric Acid: <0.05 mmol/L — ABNORMAL LOW (ref 0.05–0.27)

## 2023-10-31 MED ORDER — LACTATED RINGERS IV BOLUS
1000.0000 mL | Freq: Once | INTRAVENOUS | Status: AC
  Administered 2023-10-31: 1000 mL via INTRAVENOUS

## 2023-10-31 MED ORDER — METOPROLOL SUCCINATE ER 25 MG PO TB24
25.0000 mg | ORAL_TABLET | Freq: Once | ORAL | Status: AC
  Administered 2023-10-31: 25 mg via ORAL
  Filled 2023-10-31: qty 1

## 2023-10-31 MED ORDER — GABAPENTIN 100 MG PO CAPS
100.0000 mg | ORAL_CAPSULE | Freq: Once | ORAL | Status: DC
  Filled 2023-10-31: qty 1

## 2023-10-31 NOTE — ED Provider Notes (Signed)
 [name] Provider Note    Event Date/Time   First MD Initiated Contact with Patient 10/31/23 ARTEMUS     (approximate)   History   Arm Pain   HPI  [name] is a 89 y.o. female with history of CAD, hypertension, hyperlipidemia, GERD, rheumatoid arthritis, presenting with bilateral burning sensation to her upper arms.  States this started last 2 days, intermittent.  States this feels like a burning sensation, not shooting pain.  Also generalized weakness.  Denies any nausea vomiting or diarrhea, no chest pain shortness of breath, no headache or back pain, no urinary symptoms.  No focal weakness or numbness, no incontinence, no saddle anesthesia.  States that she had a fall 2 weeks ago but did not hurt her head or neck.  No other new falls.  No swelling to her arms.  States that she has not been taking her blood pressure medications.  Per independent review of EMS, her systolic blood pressures were 799.    On independent review, she was seen by orthopedic surgery in mid October, at that time presented for burning to the back of her neck.  Had noted a stinging and burning sensation.  Was found to have muscle spasms and tightness.  She was told that she has cervicalgia.  It appears that she has chronic cervical degenerative disc disc disease with osteoarthritis.  Had imaging that showed degenerative disc disease C2-C6.  Was prescribed a prednisone  taper for inflammation and pruritus.   Physical Exam   Triage Vital Signs: ED Triage Vitals  Encounter Vitals Group     BP --      [name] Systolic BP Percentile --      [name] Diastolic BP Percentile --      Boys Systolic BP Percentile --      Boys Diastolic BP Percentile --      Pulse --      Resp --      Temp --      Temp src --      SpO2 --      Weight 10/31/23 1923 130 lb (59 kg)     Height 10/31/23 1923 5' 1 (1.549 m)     Head Circumference --      Peak Flow --      Pain Score 10/31/23 1922 9      Pain Loc --      Pain Education --      Exclude from Growth Chart --     Most recent vital signs: Vitals:   10/31/23 1925 10/31/23 2100  BP: (!) 187/87 (!) 169/69  Pulse: 98 84  Resp: 19 17  Temp:  98.4 F (36.9 C)  SpO2: 100% 100%     General: Awake, no distress.  CV:  Good peripheral perfusion.  Resp:  Normal effort.  No thoracic cage tenderness Abd:  No distention.  Soft nontender Other:  No tenderness to her bilateral arms or legs, no midline spinal tenderness, no palpable skull deformities or tenderness, she has equal radial and DP pulses bilaterally, no focal weakness or numbness.  No unilateral swelling to her extremity, no redness.  No rash or open wounds.  No saddle anesthesia.   ED Results / Procedures / Treatments   Labs (all labs ordered are listed, but only abnormal results are displayed) Labs Reviewed  COMPREHENSIVE METABOLIC PANEL WITH GFR - Abnormal; Notable for the following components:      Result Value   CO2  19 (*)    Glucose, Bld 266 (*)    BUN 29 (*)    Creatinine, Ser 1.01 (*)    GFR, Estimated 54 (*)    Anion gap 16 (*)    All other components within normal limits  CBC WITH DIFFERENTIAL/PLATELET - Abnormal; Notable for the following components:   RBC 3.65 (*)    Hemoglobin 10.8 (*)    HCT 33.7 (*)    Neutro Abs 9.2 (*)    Lymphs Abs 0.5 (*)    All other components within normal limits  URINALYSIS, W/ REFLEX TO CULTURE (INFECTION SUSPECTED) - Abnormal; Notable for the following components:   Color, Urine YELLOW (*)    APPearance HAZY (*)    Specific Gravity, Urine 1.002 (*)    Glucose, UA >=500 (*)    Hgb urine dipstick SMALL (*)    Protein, ur 100 (*)    Bacteria, UA MANY (*)    All other components within normal limits  BETA-HYDROXYBUTYRIC ACID - Abnormal; Notable for the following components:   Beta-Hydroxybutyric Acid <0.05 (*)    All other components within normal limits  BLOOD GAS, VENOUS - Abnormal; Notable for the following  components:   pCO2, Ven 34 (*)    pO2, Ven 70 (*)    Acid-base deficit 3.0 (*)    All other components within normal limits  BASIC METABOLIC PANEL WITH GFR - Abnormal; Notable for the following components:   CO2 21 (*)    Glucose, Bld 225 (*)    BUN 28 (*)    GFR, Estimated 56 (*)    All other components within normal limits  TROPONIN I (HIGH SENSITIVITY) - Abnormal; Notable for the following components:   Troponin I (High Sensitivity) 19 (*)    All other components within normal limits  TROPONIN I (HIGH SENSITIVITY)     EKG  EKG shows, sinus rhythm, baseline is wandering but no obvious ischemic ST elevation, not significant change compared to prior   RADIOLOGY On my independent interpretation, CT without obvious fracture   PROCEDURES:  Critical Care performed: No  Procedures   MEDICATIONS ORDERED IN ED: Medications  gabapentin  (NEURONTIN ) capsule 100 mg (100 mg Oral Patient Refused/Not Given 10/31/23 1953)  metoprolol  succinate (TOPROL -XL) 24 hr tablet 25 mg (25 mg Oral Given 10/31/23 1953)  lactated ringers  bolus 1,000 mL (1,000 mLs Intravenous New Bag/Given 10/31/23 2037)     IMPRESSION / MDM / ASSESSMENT AND PLAN / ED COURSE  I reviewed the triage vital signs and the nursing notes.                              Differential diagnosis includes, but is not limited to, radiculopathy, neuropathy, she has no midline spinal tenderness, but given her falls and history of degenerative disc disease, CT cervical spine to assess for any new fracture or changes.  Also considered electrolyte derangements, dehydration, atypical ACS, UTI.  Labs, EKG, troponin, UA, will give her her blood pressure medications here that she has missed, will also give her some gabapentin .  Patient's presentation is most consistent with acute presentation with potential threat to life or bodily function.  Independent interpretation of labs and imaging below.  Labs are reassuring, not in DKA, delta  troponin is less than 15.  Patient has no chest pain or shortness of breath, no other symptoms at this time.  She had declined the gabapentin , states that her prior doctor  had told her not to take any.  Is asking to go home.  I believe this is reasonable.  Will have her follow-up outpatient with her primary care doctor as well as her orthopedic surgeon for further management of her symptoms and to get reassessed.  Considered but no indication for inpatient admission at this time, she safe for outpatient management.  Will discharge strict return precautions.  The patient is on the cardiac monitor to evaluate for evidence of arrhythmia and/or significant heart rate changes.   Clinical Course as of 10/31/23 2218  [place] Oct 31, 2023  2005 CT Cervical Spine Wo Contrast IMPRESSION: 1. No acute cervical spine fracture. 2. 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 3. Stable multilevel degenerative changes.   [TT]  2022 Independent review of labs, no leukocytosis, troponin is not elevated, electrolytes not severely deranged, glucose is mildly elevated, CO2 is 19 with anion gap could be related to dehydration versus mild DKA.  Will add on a beta hydroxybutyrate as well as VBG.  Get some fluids here and repeat BMP. [TT]  2108 Urinalysis, w/ Reflex to Culture (Infection Suspected) -Urine, Clean Catch(!) UA is not consistent with UTI, VBG pH is normal. [TT]  2121 Beta-hydroxybutyric acid(!) Not elevated [TT]  2216 For repeat labs, acidosis improving, anion gap resolved, delta Trope is less than 15.  Patient is feeling a lot better, asking to be discharged home, no chest pain or shortness of breath at this time.  I believe is reasonable for her to follow-up outpatient with orthopedic surgery for further management of her degenerative disc disease. [TT]    Clinical Course User Index [TT] [place], Lorelle Cummins, MD     FINAL CLINICAL IMPRESSION(S) / ED DIAGNOSES   Final  diagnoses:  Radiculopathy, unspecified spinal region  Weakness     Rx / DC Orders   ED Discharge Orders     None        Note:  This document was prepared using Dragon voice recognition software and may include unintentional dictation errors.    [place] Lorelle Cummins, MD 10/31/23 2218

## 2023-10-31 NOTE — Discharge Instructions (Signed)
 Please make sure to keep yourself hydrated.  Please be sure to follow-up with your primary care doctor next week to get reassessed.  Please also follow-up with orthopedic surgery for further management of your chronic degenerative disc disease in your neck as well as your radiculopathy.

## 2023-10-31 NOTE — ED Triage Notes (Signed)
 Pt to ED by EMS from home with complaints of 9/10 burning pain in her arms bilaterally. Pt reports the pain comes and goes and has been going on for the past 2 days. Pt reports 3 falls this month. Pt A&Ox4. Pt hypertensive for EMS 200/80. Pt A&Ox4.
